# Patient Record
Sex: Male | Born: 1940 | Race: White | Hispanic: No | Marital: Married | Smoking: Never smoker
Health system: Southern US, Community
[De-identification: ages and names within clinical notes are randomized; demographics above are authoritative.]

## PROBLEM LIST (undated history)

## (undated) DIAGNOSIS — Z0000 Encounter for general adult medical examination without abnormal findings: Secondary | ICD-10-CM

## (undated) DIAGNOSIS — Z9889 Other specified postprocedural states: Secondary | ICD-10-CM

## (undated) DIAGNOSIS — H269 Unspecified cataract: Secondary | ICD-10-CM

## (undated) DIAGNOSIS — E039 Hypothyroidism, unspecified: Secondary | ICD-10-CM

## (undated) DIAGNOSIS — R112 Nausea with vomiting, unspecified: Secondary | ICD-10-CM

## (undated) DIAGNOSIS — I10 Essential (primary) hypertension: Secondary | ICD-10-CM

## (undated) DIAGNOSIS — M1990 Unspecified osteoarthritis, unspecified site: Secondary | ICD-10-CM

## (undated) DIAGNOSIS — K219 Gastro-esophageal reflux disease without esophagitis: Secondary | ICD-10-CM

## (undated) DIAGNOSIS — M25511 Pain in right shoulder: Secondary | ICD-10-CM

## (undated) HISTORY — DX: Unspecified osteoarthritis, unspecified site: M19.90

## (undated) HISTORY — DX: Gastro-esophageal reflux disease without esophagitis: K21.9

## (undated) HISTORY — DX: Pain in right shoulder: M25.511

## (undated) HISTORY — PX: JOINT REPLACEMENT: SHX530

## (undated) HISTORY — DX: Encounter for general adult medical examination without abnormal findings: Z00.00

## (undated) HISTORY — PX: OTHER SURGICAL HISTORY: SHX169

## (undated) HISTORY — PX: HIP SURGERY: SHX245

## (undated) HISTORY — PX: TOTAL HIP ARTHROPLASTY: SHX124

## (undated) HISTORY — DX: Essential (primary) hypertension: I10

## (undated) HISTORY — PX: CHOLECYSTECTOMY: SHX55

## (undated) HISTORY — DX: Unspecified cataract: H26.9

## (undated) HISTORY — PX: COLONOSCOPY: SHX174

---

## 2003-05-05 HISTORY — PX: JOINT REPLACEMENT: SHX530

## 2003-07-09 ENCOUNTER — Inpatient Hospital Stay (HOSPITAL_COMMUNITY): Admission: RE | Admit: 2003-07-09 | Discharge: 2003-07-12 | Payer: Self-pay | Admitting: Orthopedic Surgery

## 2004-04-08 ENCOUNTER — Ambulatory Visit: Payer: Self-pay | Admitting: Family Medicine

## 2004-04-15 ENCOUNTER — Ambulatory Visit: Payer: Self-pay | Admitting: Family Medicine

## 2004-05-21 ENCOUNTER — Ambulatory Visit: Payer: Self-pay | Admitting: Internal Medicine

## 2004-08-19 ENCOUNTER — Ambulatory Visit: Payer: Self-pay | Admitting: Family Medicine

## 2004-11-24 ENCOUNTER — Ambulatory Visit: Payer: Self-pay | Admitting: Family Medicine

## 2005-06-02 ENCOUNTER — Ambulatory Visit: Payer: Self-pay | Admitting: Family Medicine

## 2005-06-16 ENCOUNTER — Ambulatory Visit: Payer: Self-pay | Admitting: Family Medicine

## 2005-08-05 ENCOUNTER — Ambulatory Visit: Payer: Self-pay | Admitting: Family Medicine

## 2005-09-15 ENCOUNTER — Ambulatory Visit: Payer: Self-pay | Admitting: Family Medicine

## 2005-09-22 ENCOUNTER — Ambulatory Visit: Payer: Self-pay | Admitting: Family Medicine

## 2006-03-10 ENCOUNTER — Ambulatory Visit: Payer: Self-pay | Admitting: Family Medicine

## 2006-08-18 ENCOUNTER — Ambulatory Visit: Payer: Self-pay | Admitting: Family Medicine

## 2006-08-18 LAB — CONVERTED CEMR LAB
ALT: 24 units/L (ref 0–40)
AST: 15 units/L (ref 0–37)
Basophils Relative: 0.1 % (ref 0.0–1.0)
Bilirubin, Direct: 0.3 mg/dL (ref 0.0–0.3)
CO2: 30 meq/L (ref 19–32)
Calcium: 9.6 mg/dL (ref 8.4–10.5)
Chloride: 108 meq/L (ref 96–112)
Eosinophils Relative: 1.4 % (ref 0.0–5.0)
GFR calc Af Amer: 87 mL/min
Glucose, Bld: 102 mg/dL — ABNORMAL HIGH (ref 70–99)
HCT: 41.9 % (ref 39.0–52.0)
Hemoglobin: 14.6 g/dL (ref 13.0–17.0)
Lymphocytes Relative: 18.2 % (ref 12.0–46.0)
Neutro Abs: 3.6 10*3/uL (ref 1.4–7.7)
Neutrophils Relative %: 69.2 % (ref 43.0–77.0)
Platelets: 123 10*3/uL — ABNORMAL LOW (ref 150–400)
Sodium: 144 meq/L (ref 135–145)
Total Protein: 6.8 g/dL (ref 6.0–8.3)
Triglycerides: 107 mg/dL (ref 0–149)
VLDL: 21 mg/dL (ref 0–40)
WBC: 5.2 10*3/uL (ref 4.5–10.5)

## 2007-01-12 DIAGNOSIS — K219 Gastro-esophageal reflux disease without esophagitis: Secondary | ICD-10-CM

## 2007-01-12 DIAGNOSIS — I10 Essential (primary) hypertension: Secondary | ICD-10-CM | POA: Insufficient documentation

## 2007-02-02 ENCOUNTER — Ambulatory Visit: Payer: Self-pay | Admitting: Family Medicine

## 2007-03-01 ENCOUNTER — Encounter: Payer: Self-pay | Admitting: Family Medicine

## 2007-09-21 ENCOUNTER — Ambulatory Visit: Payer: Self-pay | Admitting: Family Medicine

## 2007-09-21 DIAGNOSIS — M1990 Unspecified osteoarthritis, unspecified site: Secondary | ICD-10-CM

## 2007-09-21 DIAGNOSIS — E782 Mixed hyperlipidemia: Secondary | ICD-10-CM | POA: Insufficient documentation

## 2007-09-21 HISTORY — DX: Unspecified osteoarthritis, unspecified site: M19.90

## 2007-09-27 ENCOUNTER — Telehealth: Payer: Self-pay | Admitting: Family Medicine

## 2007-09-28 ENCOUNTER — Ambulatory Visit: Payer: Self-pay | Admitting: Family Medicine

## 2007-10-19 ENCOUNTER — Ambulatory Visit: Payer: Self-pay | Admitting: Family Medicine

## 2007-10-19 DIAGNOSIS — L259 Unspecified contact dermatitis, unspecified cause: Secondary | ICD-10-CM

## 2007-10-19 DIAGNOSIS — F528 Other sexual dysfunction not due to a substance or known physiological condition: Secondary | ICD-10-CM

## 2008-02-07 ENCOUNTER — Ambulatory Visit: Payer: Self-pay | Admitting: Family Medicine

## 2008-02-07 DIAGNOSIS — IMO0001 Reserved for inherently not codable concepts without codable children: Secondary | ICD-10-CM

## 2008-02-07 DIAGNOSIS — M779 Enthesopathy, unspecified: Secondary | ICD-10-CM | POA: Insufficient documentation

## 2008-03-27 ENCOUNTER — Ambulatory Visit: Payer: Self-pay | Admitting: Family Medicine

## 2008-09-11 ENCOUNTER — Ambulatory Visit: Payer: Self-pay | Admitting: Family Medicine

## 2008-09-11 LAB — CONVERTED CEMR LAB
OCCULT 1: NEGATIVE
OCCULT 2: NEGATIVE
OCCULT 3: NEGATIVE

## 2008-10-03 ENCOUNTER — Encounter: Payer: Self-pay | Admitting: Family Medicine

## 2008-10-06 ENCOUNTER — Emergency Department (HOSPITAL_BASED_OUTPATIENT_CLINIC_OR_DEPARTMENT_OTHER): Admission: EM | Admit: 2008-10-06 | Discharge: 2008-10-06 | Payer: Self-pay | Admitting: Emergency Medicine

## 2008-10-16 ENCOUNTER — Emergency Department (HOSPITAL_BASED_OUTPATIENT_CLINIC_OR_DEPARTMENT_OTHER): Admission: EM | Admit: 2008-10-16 | Discharge: 2008-10-16 | Payer: Self-pay | Admitting: Emergency Medicine

## 2009-03-14 ENCOUNTER — Encounter (INDEPENDENT_AMBULATORY_CARE_PROVIDER_SITE_OTHER): Payer: Self-pay | Admitting: *Deleted

## 2009-03-14 ENCOUNTER — Ambulatory Visit: Payer: Self-pay | Admitting: Family Medicine

## 2009-03-14 DIAGNOSIS — E559 Vitamin D deficiency, unspecified: Secondary | ICD-10-CM | POA: Insufficient documentation

## 2009-03-14 DIAGNOSIS — D649 Anemia, unspecified: Secondary | ICD-10-CM

## 2009-03-14 DIAGNOSIS — R350 Frequency of micturition: Secondary | ICD-10-CM

## 2009-03-14 DIAGNOSIS — T50995A Adverse effect of other drugs, medicaments and biological substances, initial encounter: Secondary | ICD-10-CM

## 2009-03-14 DIAGNOSIS — E039 Hypothyroidism, unspecified: Secondary | ICD-10-CM

## 2009-03-14 LAB — CONVERTED CEMR LAB
Bilirubin Urine: NEGATIVE
Glucose, Urine, Semiquant: NEGATIVE
Protein, U semiquant: NEGATIVE
Specific Gravity, Urine: <1.005
pH: 5.5

## 2009-03-21 LAB — CONVERTED CEMR LAB
ALT: 21 units/L (ref 0–53)
Albumin: 4.4 g/dL (ref 3.5–5.2)
BUN: 14 mg/dL (ref 6–23)
Basophils Absolute: 0 10*3/uL (ref 0.0–0.1)
CO2: 29 meq/L (ref 19–32)
Chloride: 105 meq/L (ref 96–112)
Cholesterol: 139 mg/dL (ref 0–200)
Glucose, Bld: 107 mg/dL — ABNORMAL HIGH (ref 70–99)
HCT: 42 % (ref 39.0–52.0)
Hemoglobin: 14.4 g/dL (ref 13.0–17.0)
Lymphs Abs: 0.9 10*3/uL (ref 0.7–4.0)
MCV: 95 fL (ref 78.0–100.0)
Monocytes Absolute: 0.4 10*3/uL (ref 0.1–1.0)
Neutro Abs: 3.1 10*3/uL (ref 1.4–7.7)
PSA: 0.91 ng/mL (ref 0.10–4.00)
Platelets: 129 10*3/uL — ABNORMAL LOW (ref 150.0–400.0)
Potassium: 4.5 meq/L (ref 3.5–5.1)
RDW: 11.9 % (ref 11.5–14.6)
TSH: 1.08 microintl units/mL (ref 0.35–5.50)
Total Bilirubin: 1.2 mg/dL (ref 0.3–1.2)

## 2009-04-01 ENCOUNTER — Encounter (INDEPENDENT_AMBULATORY_CARE_PROVIDER_SITE_OTHER): Payer: Self-pay | Admitting: *Deleted

## 2009-04-02 ENCOUNTER — Ambulatory Visit: Payer: Self-pay | Admitting: Internal Medicine

## 2009-04-15 ENCOUNTER — Ambulatory Visit: Payer: Self-pay | Admitting: Internal Medicine

## 2009-04-15 LAB — HM COLONOSCOPY

## 2009-04-30 ENCOUNTER — Telehealth (INDEPENDENT_AMBULATORY_CARE_PROVIDER_SITE_OTHER): Payer: Self-pay | Admitting: *Deleted

## 2009-05-02 ENCOUNTER — Ambulatory Visit: Payer: Self-pay | Admitting: Family Medicine

## 2009-12-31 ENCOUNTER — Ambulatory Visit: Payer: Self-pay | Admitting: Family Medicine

## 2010-04-29 ENCOUNTER — Telehealth: Payer: Self-pay | Admitting: Family Medicine

## 2010-05-23 ENCOUNTER — Telehealth: Payer: Self-pay | Admitting: Family Medicine

## 2010-06-01 LAB — CONVERTED CEMR LAB
AST: 15 units/L (ref 0–37)
Albumin: 4.2 g/dL (ref 3.5–5.2)
Alkaline Phosphatase: 65 units/L (ref 39–117)
BUN: 21 mg/dL (ref 6–23)
Blood in Urine, dipstick: NEGATIVE
Chloride: 108 meq/L (ref 96–112)
Eosinophils Absolute: 0.2 10*3/uL (ref 0.0–0.7)
Eosinophils Relative: 6.1 % — ABNORMAL HIGH (ref 0.0–5.0)
GFR calc non Af Amer: 65 mL/min
Glucose, Bld: 107 mg/dL — ABNORMAL HIGH (ref 70–99)
HDL: 44.9 mg/dL (ref >39.0)
LDL Cholesterol: 67 mg/dL (ref 0–99)
Monocytes Relative: 8.2 % (ref 3.0–12.0)
Neutrophils Relative %: 66.7 % (ref 43.0–77.0)
Nitrite: NEGATIVE
Platelets: 135 10*3/uL — ABNORMAL LOW (ref 150–400)
Potassium: 4.3 meq/L (ref 3.5–5.1)
Protein, U semiquant: NEGATIVE
Total CHOL/HDL Ratio: 2.8
VLDL: 12 mg/dL (ref 0–40)
WBC Urine, dipstick: NEGATIVE
WBC: 3.9 10*3/uL — ABNORMAL LOW (ref 4.5–10.5)

## 2010-06-03 NOTE — Assessment & Plan Note (Signed)
Summary: SKIN PROBLEM/NJR   Vital Signs:  Patient profile:   70 year old male Height:      70 inches (177.80 cm) Weight:      175 pounds (79.55 kg) BMI:     25.20 O2 Sat:      98 % on Room air Temp:     98.1 degrees F (36.72 degrees C) oral Pulse rate:   73 / minute BP sitting:   118 / 74  (left arm) Cuff size:   regular  Vitals Entered By: Josph Macho RMA (December 31, 2009 9:15 AM)  O2 Flow:  Room air CC: skin problem X1 month off and on- right arm and left leg irritation/ CF Is Patient Diabetic? No   History of Present Illness: Patient in today for evaluation of a rash he has had for the past several months. The rash is migratory and fluctuates in intensity and location. He cannot pin the rash on any particular product or change. He has tried eliminating meds/products for several days at a time and so far this has not altered the rash. At present he has stopped his Aspirin for the past 3 days and had no improvement in the rash. The rashes tend to be on his extremities. Started on his right arm at the wrist and was very itchy with mild rash and erythema. He then developed a patch over his right upper arm and then on his left arm. He is now having itching and rash over his upper thighs L>R and onto left posterior buttock. He works in his yard frequently but has not had any vesicles or burning suggestive of poison or any discrete lesions that resemble bug bites. No f/c/malaise/URI or GI symptoms. No CP/palp/SOB.  Current Medications (verified): 1)  Micardis 80 Mg Tabs (Telmisartan) .Marland Kitchen.. 1 By Mouth Once Daily 2)  Norvasc 5 Mg  Tabs (Amlodipine Besylate) .Marland Kitchen.. 1 By Mouth Once Daily 3)  Adult Aspirin Low Strength 81 Mg  Tbdp (Aspirin) 4)  Viagra 50 Mg  Tabs (Sildenafil Citrate) .Marland Kitchen.. 1 As Instructed 5)  Crestor 20 Mg Tabs (Rosuvastatin Calcium) .... Pt Takes 1/2 Tab Once Daily. 6)  Vitamin D (Ergocalciferol) 50000 Unit Caps (Ergocalciferol) .Marland Kitchen.. 1 Weekly For 12 Weeks  Allergies  (verified): No Known Drug Allergies  Past History:  Past medical history reviewed for relevance to current acute and chronic problems. Social history (including risk factors) reviewed for relevance to current acute and chronic problems.  Past Medical History: Reviewed history from 01/12/2007 and no changes required. Arthritis GERD Hypertension  Social History: Reviewed history from 01/12/2007 and no changes required. Retired Married Never Smoked  Review of Systems      See HPI  Physical Exam  General:  Well-developed,well-nourished,in no acute distress; alert,appropriate and cooperative throughout examination Head:  Normocephalic and atraumatic without obvious abnormalities. No apparent alopecia or balding. Mouth:  Oral mucosa and oropharynx without lesions or exudates.  Teeth in good repair. Neck:  No deformities, masses, or tenderness noted. Lungs:  Normal respiratory effort, chest expands symmetrically. Lungs are clear to auscultation, no crackles or wheezes. Heart:  Normal rate and regular rhythm. S1 and S2 normal without gallop, murmur, click, rub or other extra sounds. Abdomen:  Bowel sounds positive,abdomen soft and non-tender without masses, organomegaly or hernias noted. Pulses:  R and L carotid,radial,femoral,dorsalis pedis and posterior tibial pulses are full and equal bilaterally Extremities:  No clubbing, cyanosis, edema, or deformity noted   Skin:  patch of excoriated, small scabbed lesions on  right upper arm. A mildly erythematous patch over b/l wrists noted. No obvious uriticarial leisons Psych:  Cognition and judgment appear intact. Alert and cooperative with normal attention span and concentration. No apparent delusions, illusions, hallucinations   Impression & Recommendations:  Problem # 1:  DERMATITIS, ALLERGIC (ICD-692.9)  His updated medication list for this problem includes:    Triamcinolone Acetonide 0.1 % Crea (Triamcinolone acetonide) .Marland Kitchen... Apply  to affected areas two times a day as needed itching/rash    Zyrtec Allergy 10 Mg Tabs (Cetirizine hcl) .Marland Kitchen... 1 tab by mouth once daily to two times a day as needed dermititis/pruritis Try OTC products as directed and report persistent rash for further evaluation.  He has some concerns that this first started appearing some time after his insurance company made him switch from brand name Norvasc to Amlodipine. May need to consider switching back to the brand name if no improvement  Problem # 2:  HYPERTENSION (ICD-401.9)  His updated medication list for this problem includes:    Micardis 80 Mg Tabs (Telmisartan) .Marland Kitchen... 1 by mouth once daily    Norvasc 5 Mg Tabs (Amlodipine besylate) .Marland Kitchen... 1 by mouth once daily Well controlled at today's visit, no changes to meds  Complete Medication List: 1)  Micardis 80 Mg Tabs (Telmisartan) .Marland Kitchen.. 1 by mouth once daily 2)  Norvasc 5 Mg Tabs (Amlodipine besylate) .Marland Kitchen.. 1 by mouth once daily 3)  Adult Aspirin Low Strength 81 Mg Tbdp (Aspirin) 4)  Viagra 50 Mg Tabs (Sildenafil citrate) .Marland Kitchen.. 1 as instructed 5)  Crestor 20 Mg Tabs (Rosuvastatin calcium) .... Pt takes 1/2 tab once daily. 6)  Vitamin D (ergocalciferol) 50000 Unit Caps (Ergocalciferol) .Marland KitchenMarland KitchenMarland Kitchen 1 weekly for 12 weeks 7)  Triamcinolone Acetonide 0.1 % Crea (Triamcinolone acetonide) .... Apply to affected areas two times a day as needed itching/rash 8)  Zyrtec Allergy 10 Mg Tabs (Cetirizine hcl) .Marland Kitchen.. 1 tab by mouth once daily to two times a day as needed dermititis/pruritis  Patient Instructions: 1)  Please schedule a follow-up appointment as needed if symptoms worsen or do not resolve 2)  Avoid any new products and consider changing Laundry soap to a mild soap with no perfumes or additives. Such as Associate Professor.  3)  Zyrtec 10mg  as directed 4)  Sarna antiitch lotion as needed 5)  Witch Hazel Astringent will clean the area and decrease itching as well 6)  Triamcinolone cream is a steroid so use as  little as needed to cover the area up to twice daily when rash or itching is present Prescriptions: ZYRTEC ALLERGY 10 MG TABS (CETIRIZINE HCL) 1 tab by mouth once daily to two times a day as needed dermititis/pruritis  #60 x 1   Entered and Authorized by:   Danise Edge MD   Signed by:   Danise Edge MD on 12/31/2009   Method used:   Electronically to        [facility] (retail)       [address]       [place], [place]  [address]       Ph: [phone] or [phone]       Fax: [phone]   RxID:   [id number] TRIAMCINOLONE ACETONIDE 0.1 % CREA (TRIAMCINOLONE ACETONIDE) apply to affected areas two times a day as needed itching/rash  #60gm x 1   Entered and Authorized by:   Danise Edge MD   Signed by:   Misty Stanley  Abner Greenspan MD on 12/31/2009   Method used:   Electronically to        [facility] (retail)       [address]       [place], [place]  [address]       Ph: [phone] or [phone]       Fax: [phone]   RxID:   [id number]

## 2010-06-05 NOTE — Progress Notes (Signed)
Summary: micardis  Phone Note Refill Request Message from:  Fax from Pharmacy on April 29, 2010 2:47 PM  Refills Requested: Medication #1:  MICARDIS 80 MG TABS 1 by mouth once daily Initial call taken by: Kern Reap CMA Duncan Dull),  April 29, 2010 2:47 PM    Prescriptions: MICARDIS 80 MG TABS (TELMISARTAN) 1 by mouth once daily  #30 Tablet x 3   Entered by:   Kern Reap CMA (AAMA)   Authorized by:   Judithann Sheen MD   Signed by:   Kern Reap CMA (AAMA) on 04/29/2010   Method used:   Electronically to        [facility] (retail)       [address]       [place], [place]  [address]       Ph: [phone] or [phone]       Fax: [phone]   RxID:   [id number]

## 2010-06-05 NOTE — Progress Notes (Signed)
Summary: REFILL REQUEST  Phone Note Refill Request Message from:  Patient on May 23, 2010 12:27 PM  Refills Requested: Medication #1:  CRESTOR 20 MG TABS pt takes 1/2 tab once daily.   Notes: [facility] [address].... Pt has appt for cpx with Dr Scotty Court on 09/16/10.  Medication #2:  NORVASC 5 MG  TABS 1 by mouth once daily  Pt has appt for cpx with Dr Scotty Court on 09/16/10.   Initial call taken by: Debbra Riding,  May 23, 2010 12:28 PM  Follow-up for Phone Call        done Follow-up by: Kyung Rudd, CMA,  May 23, 2010 1:47 PM    Prescriptions: CRESTOR 20 MG TABS (ROSUVASTATIN CALCIUM) pt takes 1/2 tab once daily.  #30 x 11   Entered by:   Kyung Rudd, CMA   Authorized by:   Judithann Sheen MD   Signed by:   Kyung Rudd, CMA on 05/23/2010   Method used:   Electronically to        [facility] (retail)       [address]       [place], [place]  [address]       Ph: [phone] or [phone]       Fax: [phone]   RxID:   [id number] NORVASC 5 MG  TABS (AMLODIPINE BESYLATE) 1 by mouth once daily  #30 Tablet x 11   Entered by:   Kyung Rudd, CMA   Authorized by:   Judithann Sheen MD   Signed by:   Kyung Rudd, CMA on 05/23/2010   Method used:   Electronically to        [facility] (retail)       [address]       [place], [place]  [address]       Ph: [phone] or [phone]       Fax: [phone]   RxID:   [id number]

## 2010-09-03 ENCOUNTER — Encounter: Payer: Self-pay | Admitting: Internal Medicine

## 2010-09-03 ENCOUNTER — Ambulatory Visit (INDEPENDENT_AMBULATORY_CARE_PROVIDER_SITE_OTHER): Payer: Medicare Other | Admitting: Internal Medicine

## 2010-09-03 DIAGNOSIS — L989 Disorder of the skin and subcutaneous tissue, unspecified: Secondary | ICD-10-CM

## 2010-09-03 MED ORDER — CEPHALEXIN 500 MG PO CAPS: 500.0000 mg | ORAL_CAPSULE | Freq: Three times a day (TID) | ORAL | Status: AC

## 2010-09-03 NOTE — Assessment & Plan Note (Signed)
Does not appear to be actively infected at this time. Continue over-the-counter anti-body ointment for prophylaxis. Given prescription for Keflex x7 days to hold. Again if develops fevers, increasing erythema or pus/discharge. Followup if no improvement or worsening.

## 2010-09-03 NOTE — Progress Notes (Signed)
  Subjective:    Patient ID: Micheal Mcintyre, male    DOB: Jun 12, 1940, 70 y.o.   MRN: [id number]  HPI Pt presents to clinic for evaluation of sore on foot. Notes a five-day history of left dorsal foot sore which began initially as a blister. Spontaneously ruptured. Has had no fever, chills or discharge pus. Has had some minimal erythema appears using over-the-counter enema cream. Does have remote history of left hip infection and subsequent bacteremia. No alleviating or exacerbating factors. No other complaints.     Review of Systems  Constitutional: Negative for fever and chills.  Musculoskeletal: Positive for gait problem. Negative for arthralgias.  Skin: Positive for color change and wound. Negative for pallor and rash.       Objective:   Physical Exam  Nursing note and vitals reviewed. Constitutional: He appears well-developed and well-nourished. No distress.  HENT:  Head: Normocephalic and atraumatic.  Eyes: Conjunctivae are normal.  Neurological: He is alert.  Skin: Skin is warm and dry. No rash noted. He is not diaphoretic. There is erythema. No pallor.       Left dorsal foot reveals small approximately half centimeter superficial sore. Minimal surrounding erythema. No discharge. Nontender.          Assessment & Plan:

## 2010-09-16 ENCOUNTER — Ambulatory Visit (INDEPENDENT_AMBULATORY_CARE_PROVIDER_SITE_OTHER): Payer: Medicare Other | Admitting: Family Medicine

## 2010-09-16 ENCOUNTER — Encounter: Payer: Self-pay | Admitting: Family Medicine

## 2010-09-16 DIAGNOSIS — R3915 Urgency of urination: Secondary | ICD-10-CM

## 2010-09-16 DIAGNOSIS — E785 Hyperlipidemia, unspecified: Secondary | ICD-10-CM

## 2010-09-16 DIAGNOSIS — E559 Vitamin D deficiency, unspecified: Secondary | ICD-10-CM

## 2010-09-16 DIAGNOSIS — N401 Enlarged prostate with lower urinary tract symptoms: Secondary | ICD-10-CM

## 2010-09-16 DIAGNOSIS — I10 Essential (primary) hypertension: Secondary | ICD-10-CM

## 2010-09-16 DIAGNOSIS — E039 Hypothyroidism, unspecified: Secondary | ICD-10-CM

## 2010-09-16 DIAGNOSIS — D649 Anemia, unspecified: Secondary | ICD-10-CM

## 2010-09-16 LAB — POCT URINALYSIS DIPSTICK
Ketones, UA: NEGATIVE
Protein, UA: NEGATIVE
Spec Grav, UA: 1.01
Urobilinogen, UA: 0.2
pH, UA: 7

## 2010-09-16 LAB — CBC WITH DIFFERENTIAL/PLATELET
Basophils Relative: 0.6 % (ref 0.0–3.0)
Eosinophils Relative: 1.7 % (ref 0.0–5.0)
Lymphocytes Relative: 16.2 % (ref 12.0–46.0)
Monocytes Absolute: 0.4 10*3/uL (ref 0.1–1.0)
Monocytes Relative: 7.3 % (ref 3.0–12.0)
Neutrophils Relative %: 74.2 % (ref 43.0–77.0)
Platelets: 130 10*3/uL — ABNORMAL LOW (ref 150.0–400.0)
RBC: 4.39 Mil/uL (ref 4.22–5.81)
WBC: 5.3 10*3/uL (ref 4.5–10.5)

## 2010-09-16 LAB — BASIC METABOLIC PANEL
CO2: 30 mEq/L (ref 19–32)
Calcium: 9.6 mg/dL (ref 8.4–10.5)
Creatinine, Ser: 1.1 mg/dL (ref 0.4–1.5)
GFR: 71.61 mL/min (ref >60.00)
Glucose, Bld: 93 mg/dL (ref 70–99)
Sodium: 142 mEq/L (ref 135–145)

## 2010-09-16 LAB — HEPATIC FUNCTION PANEL
ALT: 22 U/L (ref 0–53)
AST: 15 U/L (ref 0–37)
Albumin: 4.2 g/dL (ref 3.5–5.2)
Total Bilirubin: 1 mg/dL (ref 0.3–1.2)
Total Protein: 6.9 g/dL (ref 6.0–8.3)

## 2010-09-16 LAB — LIPID PANEL
Cholesterol: 130 mg/dL (ref 0–200)
LDL Cholesterol: 54 mg/dL (ref 0–99)
Triglycerides: 128 mg/dL (ref 0.0–149.0)
VLDL: 25.6 mg/dL (ref 0.0–40.0)

## 2010-09-16 LAB — PSA: PSA: 0.91 ng/mL (ref 0.10–4.00)

## 2010-09-16 MED ORDER — AMLODIPINE BESYLATE 5 MG PO TABS: 5.0000 mg | ORAL_TABLET | Freq: Every day | ORAL | Status: DC

## 2010-09-16 MED ORDER — TELMISARTAN 80 MG PO TABS: 80.0000 mg | ORAL_TABLET | Freq: Every day | ORAL | Status: DC

## 2010-09-16 MED ORDER — ROSUVASTATIN CALCIUM 20 MG PO TABS: 20.0000 mg | ORAL_TABLET | Freq: Every day | ORAL | Status: DC

## 2010-09-16 NOTE — Patient Instructions (Signed)
I think you are doing fine,for blood pressure  Take 1/2  amlodypine each day ,continue micardis Check BP 3 time week for next month sometimes in am other  times of day Will call la results

## 2010-09-19 NOTE — Discharge Summary (Signed)
NAME:  Micheal Mcintyre, Micheal Mcintyre                             ACCOUNT NO.:  [id number]   MEDICAL RECORD NO.:  [id number]                   PATIENT TYPE:  INP   LOCATION:  0471                                 FACILITY:  [facility]   PHYSICIAN:  Ollen Gross, M.D.                 DATE OF BIRTH:  February 24, 1941   DATE OF ADMISSION:  07/09/2003  DATE OF DISCHARGE:  07/12/2003                                 DISCHARGE SUMMARY   ADMITTING DIAGNOSES:  1. Osteoarthritis right hip.  2. Hypertension.  3. History of reflux disease.  4. History of prostatitis.   DISCHARGE DIAGNOSES:  1. 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  2. Postoperative asymptomatic hypotension, resolved.  3. Mild postoperative blood loss anemia, did not require transfusion.  4. Hypertension.  5. History of reflux disease.  6. History of prostatitis.   PROCEDURE:  Date of surgery:  July 09, 2003.  Right total hip arthroplasty.  Surgeon:  Dr. Homero Fellers Aluisio.  Assistant:  Avel Peace, P.A.-Mcintyre.  Anesthesia:  General.  Blood loss:  250 mL.  Hemovac drain x1.   CONSULTS:  None.   BRIEF HISTORY:  [name] is a 70 year old male with severe end-stage  osteoarthritis of the right hip.  Pain has been refractory to nonoperative  management.  He now presents for a right total hip arthroplasty.   LABORATORY DATA:  CBC preoperatively:  Hemoglobin of 14.6, hematocrit of  43.0, differential all within normal limits, white count normal at 6.6.  Postoperative H&H 11.3 and 32.2.  Last noted H&H 10.8 and 31.3.  Preoperative PT/PTT 11.6 and 31 respectively with an INR of 0.8.  Serial  protimes followed and last noted PT/INR 17.0 and 1.6.  Chem panel on  admission:  Elevated BUN of 29, elevated AST of 46, elevated ALT of 93.  Serial BMETs are followed.  BUN came back down to 12, drop in sodium from  143 to 133, electrolytes remained within normal limits.  Urinalysis  preoperatively negative.  Blood group and type O positive.   Chest x-ray  dated July 04, 2003:  No active disease.  Right hip films preoperatively  showed severe degenerative arthritic changes of the right hip.  Postoperative hip films:  Satisfactory appearance of right total hip  replacement, severe arthritic changes of the left hip.   HOSPITAL COURSE:  The patient admitted to [facility], taken to the  OR, and underwent above-stated procedure without complications.  The patient  tolerated the procedure well and later transferred to the recovery room and  to the orthopedic floor to continue postoperative care.  Vital signs were  followed, given 24 hours of postoperative antibiotics of Ancef, Coumadin for  3 weeks, started back on his home medications.  PT and OT consulted  postoperatively.  Weightbearing as tolerated.  Hemovac drain placed at the  time of surgery pulled on postoperative day #  1.  He was also noted to have  some asymptomatic hypotension with low blood pressure.  This was taken from  [facility].  We used a manual cuff and it was still low.  We held his blood  pressure medications.  By day #2 the pressure started to trend back up  through the night and though it was still low in the morning of 98/53, the  highest it got through the night was 126/54.  He was doing a little bit  better with his pain control.  PCAs had been discontinued.  IV fluids were  discontinued.  Dressing change initiated on postoperative day #2.  His  incision was healing well.  Even though he had some mild hypotension which  was asymptomatic he actually did very well with his physical therapy.  He  was up ambulating approximately 70 feet by day #2 and then up to 80 feet  combined walking on day #3.  By day #3 his pressure was back up, 105/52, and  his medications were resumed.  Incision was healing well, no signs of  infection.  He was progressing well, pain under control, and was discharged  home.   DISCHARGE MEDICATIONS AND PLAN:  1. The patient was  discharged home on July 12, 2003.  2. Discharge diagnoses:  Please see above.  3. Discharge medications:  Coumadin, Percocet, Robaxin.  4. Diet:  Low sodium diet as tolerated.  5. Follow up in 2 weeks.  Call the office for an appointment.   DISPOSITION:  Home.   ACTIVITY:  Weightbearing as tolerated, total hip precautions.   CONDITION ON DISCHARGE:  Improved.     Alexzandrew L. Julien Girt, P.A.              Ollen Gross, M.D.    ALP/MEDQ  D:  08/15/2003  T:  08/15/2003  Job:  [id number]   cc:   Ellin Saba., M.D.  [address] [place]  [place] [address]  Fax: [phone]

## 2010-09-19 NOTE — H&P (Signed)
NAME:  Micheal Mcintyre, Micheal Mcintyre                             ACCOUNT NO.:  [id number]   MEDICAL RECORD NO.:  [id number]                   PATIENT TYPE:  INP   LOCATION:  0471                                 FACILITY:  [facility]   PHYSICIAN:  Ollen Gross, M.D.                 DATE OF BIRTH:  [birth year]   DATE OF ADMISSION:  07/09/2003  DATE OF DISCHARGE:                                HISTORY & PHYSICAL   CHIEF COMPLAINT:  Right hip pain.   HISTORY OF PRESENT ILLNESS:  The patient is a 70 year old male who has been  seen by Dr. Lequita Halt for a long history regarding pain on the left. 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 Unfortunately, he developed sepsis in the hip and was  hospitalized for a month with septicemia. He has Girdlestone resection of  the left hip at this time. For many years now he has placed much pressure on  the right hip and the right hip has progressed with increasing pain and  difficulty with mobility. 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 Right now, he has pain and functional limitation. He is seen in  the office where the hip shows severe end-stage arthritis with bone-on-bone  changes and osteophyte formation. X-ray also shows resection arthroplasty on  the left with shortening of approximately an inch. It is felt he would  benefit from undergoing a hip replacement arthroplasty on the right. Risks  and benefits of the procedure have been discussed with the patient and he  elects to proceed with surgery. He has been seen preoperatively by Dr.  Scotty Court and felt to be in good condition for his up and coming surgery.   ALLERGIES:  No known drug allergies.   CURRENT MEDICATIONS:  Micardis, Norvasc, Aleve stopped prior to surgery,  Extra-Strength Tylenol.   PAST MEDICAL HISTORY:  Hypertension, reflux disease with no recent flare  ups, history of prostatitis, and osteoarthritis.   PAST SURGICAL HISTORY:  Multiple left hip surgeries including left hip  pinning for slipped capital epiphysis, removal of pinning, and then later a  Girdlestone procedure on the left hip.   FAMILY HISTORY:  Mother deceased, age 10, with a history of skin cancer.  Father deceased, age 71, with history of MI.   SOCIAL HISTORY:  Married, retired, Occupational psychologist. Nonsmoker.  Occasional intake of alcohol. He has two children.   REVIEW OF SYSTEMS:  GENERAL: No fever, chills, nightsweats. NEUROLOGIC: No  seizure, syncope, or paralysis. RESPIRATORY:  No shortness of breath,  productive cough, or hemoptysis. CARDIOVASCULAR: No chest pain, angina, or  orthopnea. GI: No nausea, vomiting, diarrhea, or constipation. GU: No  dysuria, hematuria, or discharge. MUSCULOSKELETAL: Pertinent to that of the  hip found in the history of present  illness.   PHYSICAL EXAMINATION:  VITAL SIGNS: Pulse 92, respirations 12, blood  pressure 112/62.  GENERAL: The patient is a 70 year old white male, well-nourished, well-  developed, in no acute distress. He is alert and cooperative, very pleasant  at the time of exam. He is accompanied by his wife. He is ambulating with a  cane.  HEENT:  Normocephalic and atraumatic. Pupils are round and reactive.  Oropharynx is clear. EOMs are intact.  NECK: Supple. No carotid bruits.  CHEST: Clear in the anterior and posterior chest wall.  HEART: Regular rate and rhythm.  No murmurs.  ABDOMEN: Soft, nontender. Bowel sounds are present.  RECTAL/BREASTS/GENITALIA: Not done; not pertinent to the present illness.  EXTREMITIES: Significant to right hip, he only has right hip flexion to 90  degrees. There is no internal or external rotation on exam. Only about 15 to  20 degrees of abduction. He does ambulate with an antalgic gait. He has  about a one-inch leg length discrepancy, the left leg being shorter than the   right.   IMPRESSION:  1. Osteoarthritis, right hip.  2. Hypertension.  3. History of reflux disease.  4. History of prostatitis.   PLAN:  The patient admitted to [facility] to undergo right total  hip replacement arthroplasty. Surgery will be performed by Dr. Ollen Gross.     Micheal Mcintyre, P.A.              Ollen Gross, M.D.    ALP/MEDQ  D:  07/10/2003  T:  07/10/2003  Job:  [id number]   cc:   Ellin Saba., M.D.  [address] [place]  [place] [address]  Fax: [phone]   Ollen Gross, M.D.  [facility]  [address]  [address]  [place]  [place] [address]  Fax: [phone]

## 2010-09-19 NOTE — Op Note (Signed)
NAME:  Micheal Mcintyre, Micheal Mcintyre                             ACCOUNT NO.:  [id number]   MEDICAL RECORD NO.:  [id number]                   PATIENT TYPE:  INP   LOCATION:  X009                                 FACILITY:  [facility]   PHYSICIAN:  Ollen Gross, M.D.                 DATE OF BIRTH:  Jun 10, 1940   DATE OF PROCEDURE:  07/09/2003  DATE OF DISCHARGE:                                 OPERATIVE REPORT   PREOPERATIVE DIAGNOSIS:  Osteoarthritis, right hip.   POSTOPERATIVE DIAGNOSIS:  Osteoarthritis, right hip.   PROCEDURE:  Right total hip arthroplasty.   SURGEON:  Gus Rankin. Aluisio, M.D.   ASSISTANT:  Avel Peace, P.A.-Mcintyre.   ANESTHESIA:  General.   ESTIMATED BLOOD LOSS:  250.   DRAIN:  Hemovac x 1.   COMPLICATIONS:  None.   CONDITION:  Stable to recovery.   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  He presents now for right total hip arthroplasty.   PROCEDURE IN DETAIL:  After the successful administration of general  anesthetic, the patient is placed in the left lateral decubitus position  with the right side up and held with the hip positioner.  The right lower  extremity is isolated from his perineum with plastic drapes and prepped and  draped in the usual sterile fashion.  Mini posterolateral incision is made  with a 10 blade through subcutaneous tissue to the level of fascia lata  which is incised in line with the skin incision.  Sciatic nerve is palpated  and protected and short rotators isolated off the femur.  Capsulectomy is  performed, and the hip is dislocated.  The center of the femoral head is  marked, and a trial prosthesis is placed such that the center of the trial  head corresponds to the center of his native femoral head.  Osteotomy line  is marked on the femoral neck, and osteotomy is made with an oscillating  saw.  The femur is then retracted  anteriorly to gain acetabular exposure.   Acetabular labrum and osteophytes are removed.  Reaming is starting at a 47,  coursing in increments of 2 to a 53.  Then a 54 mm Pinnacle acetabular shell  is placed in anatomic position and transfixed with two dome screws.  Trial  36 mm neutral liner is placed.   The femur is then prepared, first with the canal finder and then irrigation.  Axial remaining is performed up to 13.5 mm, proximal reaming to an 18D, and  the sleeve machined to a large.  An 18D large trial sleeve and 18 x 13 stem  and a 36 plus 8 neck are placed.  The neck is matching the native  anteversion of the patient's femoral neck.  The 36 plus 0 head was placed.  The  hip is reduced with excellent stability, full extension, full external  rotation, 70 degrees flexion, 40 degrees adduction, 90 degrees internal  rotation, and 90 degrees flexion and 70 degrees internal rotation.  There is  just a tiny bit of soft tissue laxity with a shuck test, and we went to a 36  plus 3 which effectively tightened that up.  The trials are then removed,  and a permanent apex hole eliminator and permanent 36 mm neutral Ultra-met  metal liner is placed into the acetabular shell.  This is a metal-on-metal  hip replacement.  The 18D large sleeve is placed into the proximal femur.  Then the 18 x 13 stem with a 36 plus 8 neck is placed.  A 36 plus 3 head is  placed, and the hip is reduced with the same stability parameters.  Wound is  then copiously irrigated with antibiotic solution and external rotators  reattached to the femur through drill holes.  Fascia lata is closed over a  Hemovac drain with interrupted #1 Vicryl, subcu closed with #1 then 2-0  Vicryl, and subcuticular running 4-0 Monocryl.  The incision is cleaned and  dried and Steri-Strips and a bulky sterile dressing applied.  He is then  awakened and transported to recovery in stable condition.                                                Ollen Gross, M.D.    FA/MEDQ  D:  07/09/2003  T:  07/09/2003  Job:  [id number]

## 2010-09-22 ENCOUNTER — Encounter: Payer: Self-pay | Admitting: Family Medicine

## 2010-09-22 NOTE — Progress Notes (Signed)
  Subjective:    Patient ID: Micheal Mcintyre, male    DOB: 09/24/1940, 70 y.o.   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 He has had Hyper tension for some time but has been controlled and in fact has been low readings 162 and at home or readings done that, he is complaining of pain in the left shoulder strain lifted his grandchild He relates he is dizzy at times when standing standing out or change of position As correct all dysfunction which is controlled with either Viagra or Cialis    HPI    Review of Systems  Constitutional: Negative.   HENT: Negative.   Eyes: Negative.   Respiratory: Negative.   Cardiovascular: Negative.        Hypertension well controlled  Gastrointestinal: Negative.   Genitourinary:       Erectile dysfunction  Musculoskeletal: Negative.   Skin: Negative.   Neurological: Negative.   Hematological: Negative.   Psychiatric/Behavioral: Negative.        Objective:   Physical Exam the patient is well-developed well-nourished white male who is in no distress pleasant and cooperative HEENT negative carotid pulses good thyroid not palpable Lungs clear to palpation percussion and auscultation no dullness no rales Heart no evidence of cardiomegaly heart sounds good without murmurs regular rhythm Abdomen liver spleen and kidneys are nonpalpable no masses felt cholecystectomy scar bowel sounds normal Genitalia normal rectal exam reveals normal prostate no enlargement no nodules no tenderness Extremities right hip displacement right leg is longer than left 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 Day erectilel dysfunction continue Viagra Continued at 81 mg aspirin q. day

## 2011-01-22 ENCOUNTER — Telehealth: Payer: Self-pay

## 2011-01-22 NOTE — Telephone Encounter (Signed)
Left a message for pt stating it is okay to drop off paper to be filled out.

## 2011-01-22 NOTE — Telephone Encounter (Signed)
Pt called about diability parking placard that expires in Nov. 2012. Pt states he needs it signed. Would like to know if he can bring it by or if he needs an appt.

## 2011-01-22 NOTE — Telephone Encounter (Signed)
Per Dr. Scotty Court drop it off.

## 2011-09-22 ENCOUNTER — Ambulatory Visit: Payer: Medicare Other | Admitting: Internal Medicine

## 2011-10-09 ENCOUNTER — Ambulatory Visit (INDEPENDENT_AMBULATORY_CARE_PROVIDER_SITE_OTHER): Payer: Medicare Other | Admitting: Internal Medicine

## 2011-10-09 ENCOUNTER — Encounter: Payer: Self-pay | Admitting: Internal Medicine

## 2011-10-09 VITALS — BP 108/60 | HR 69 | Temp 97.9°F | Resp 18 | Ht 70.0 in | Wt 174.0 lb

## 2011-10-09 DIAGNOSIS — I10 Essential (primary) hypertension: Secondary | ICD-10-CM | POA: Diagnosis not present

## 2011-10-09 DIAGNOSIS — Z79899 Other long term (current) drug therapy: Secondary | ICD-10-CM

## 2011-10-09 DIAGNOSIS — Z125 Encounter for screening for malignant neoplasm of prostate: Secondary | ICD-10-CM

## 2011-10-09 DIAGNOSIS — E039 Hypothyroidism, unspecified: Secondary | ICD-10-CM

## 2011-10-09 DIAGNOSIS — E785 Hyperlipidemia, unspecified: Secondary | ICD-10-CM | POA: Diagnosis not present

## 2011-10-09 LAB — CBC WITH DIFFERENTIAL/PLATELET
Basophils Absolute: 0 10*3/uL (ref 0.0–0.1)
Eosinophils Absolute: 0.1 10*3/uL (ref 0.0–0.7)
Eosinophils Relative: 3 % (ref 0–5)
HCT: 38.7 % — ABNORMAL LOW (ref 39.0–52.0)
Lymphocytes Relative: 19 % (ref 12–46)
Lymphs Abs: 0.9 10*3/uL (ref 0.7–4.0)
MCH: 30.5 pg (ref 26.0–34.0)
MCV: 88 fL (ref 78.0–100.0)
Monocytes Absolute: 0.4 10*3/uL (ref 0.1–1.0)
Platelets: 198 10*3/uL (ref 150–400)
RDW: 13.1 % (ref 11.5–15.5)
WBC: 4.8 10*3/uL (ref 4.0–10.5)

## 2011-10-09 LAB — LIPID PANEL: LDL Cholesterol: 71 mg/dL (ref 0–99)

## 2011-10-09 LAB — HEPATIC FUNCTION PANEL
AST: 13 U/L (ref 0–37)
Alkaline Phosphatase: 83 U/L (ref 39–117)
Indirect Bilirubin: 0.5 mg/dL (ref 0.0–0.9)
Total Bilirubin: 0.6 mg/dL (ref 0.3–1.2)

## 2011-10-09 LAB — BASIC METABOLIC PANEL
BUN: 24 mg/dL — ABNORMAL HIGH (ref 6–23)
CO2: 27 mEq/L (ref 19–32)
Calcium: 8.9 mg/dL (ref 8.4–10.5)
Chloride: 108 mEq/L (ref 96–112)
Creat: 1.08 mg/dL (ref 0.50–1.35)

## 2011-10-09 MED ORDER — AMLODIPINE BESYLATE 5 MG PO TABS: 5.0000 mg | ORAL_TABLET | Freq: Every day | ORAL | Status: DC

## 2011-10-09 NOTE — Patient Instructions (Signed)
Please schedule fasting labs prior to next visit Lipid/lft-272.4 

## 2011-10-10 LAB — URINALYSIS, ROUTINE W REFLEX MICROSCOPIC
Bilirubin Urine: NEGATIVE
Glucose, UA: NEGATIVE mg/dL
Ketones, ur: NEGATIVE mg/dL
Specific Gravity, Urine: 1.023 (ref 1.005–1.030)
pH: 6 (ref 5.0–8.0)

## 2011-10-11 NOTE — Assessment & Plan Note (Signed)
Obtain lipid/lft. 

## 2011-10-11 NOTE — Progress Notes (Signed)
  Subjective:    Patient ID: Micheal Mcintyre, male    DOB: Sep 19, 1940, 71 y.o.   MRN: [id number]  HPI Pt presents to clinic for followup of multiple medical problems. BP running borderline low. Has intermittent dizziness. Taking 1/2 dose crestor QOD.   Past Medical History  Diagnosis Date  . Arthritis   . GERD (gastroesophageal reflux disease)   . Hypertension    Past Surgical History  Procedure Date  . Cholecystectomy   . Hip surgery   . Total hip arthroplasty   . Joint replacement     reports that he has never smoked. He has never used smokeless tobacco. He reports that he drinks alcohol. He reports that he does not use illicit drugs. family history includes Diabetes in his maternal uncle and Heart attack (age of onset:67) in his father.  There is no history of Hypertension, and Breast cancer, and Colon cancer, and Prostate cancer, . No Known Allergies    Review of Systems see hpi     Objective:   Physical Exam  Physical Exam  Nursing note and vitals reviewed. Constitutional: He appears well-developed and well-nourished. No distress.  HENT:  Head: Normocephalic and atraumatic.  Right Ear: Tympanic membrane and external ear normal.  Left Ear: Tympanic membrane and external ear normal.  Nose: Nose normal.  Mouth/Throat: Uvula is midline, oropharynx is clear and moist and mucous membranes are normal. No oropharyngeal exudate.  Eyes: Conjunctivae and EOM are normal. Pupils are equal, round, and reactive to light. Right eye exhibits no discharge. Left eye exhibits no discharge. No scleral icterus.  Neck: Neck supple. Carotid bruit is not present. No thyromegaly present.  Cardiovascular: Normal rate, regular rhythm and normal heart sounds.  Exam reveals no gallop and no friction rub.   No murmur heard. Pulmonary/Chest: Effort normal and breath sounds normal. No respiratory distress. He has no wheezes. He has no rales.  Abdominal: Soft. He exhibits no distension and no mass. There  is no hepatosplenomegaly. There is no tenderness. There is no rebound. Hernia confirmed negative in the right inguinal area and confirmed negative in the left inguinal area.   Neurological: He is alert.  Skin: Skin is warm and dry. He is not diaphoretic.  Psychiatric: He has a normal mood and affect.        Assessment & Plan:

## 2011-10-11 NOTE — Assessment & Plan Note (Signed)
Decrease norvasc dose 1/2 tablet. Monitor bp.

## 2011-10-11 NOTE — Assessment & Plan Note (Signed)
Obtain tsh  

## 2011-10-12 ENCOUNTER — Other Ambulatory Visit: Payer: Self-pay | Admitting: Internal Medicine

## 2011-10-12 DIAGNOSIS — E785 Hyperlipidemia, unspecified: Secondary | ICD-10-CM

## 2011-10-15 DIAGNOSIS — H04129 Dry eye syndrome of unspecified lacrimal gland: Secondary | ICD-10-CM | POA: Diagnosis not present

## 2011-10-19 ENCOUNTER — Other Ambulatory Visit: Payer: Self-pay | Admitting: Internal Medicine

## 2011-10-19 DIAGNOSIS — E785 Hyperlipidemia, unspecified: Secondary | ICD-10-CM

## 2011-10-19 DIAGNOSIS — R739 Hyperglycemia, unspecified: Secondary | ICD-10-CM

## 2011-10-28 ENCOUNTER — Telehealth: Payer: Self-pay | Admitting: Internal Medicine

## 2011-10-28 MED ORDER — TELMISARTAN 80 MG PO TABS: 80.0000 mg | ORAL_TABLET | Freq: Every day | ORAL | Status: DC

## 2011-10-28 NOTE — Telephone Encounter (Signed)
Refill sent to pharmacy.   

## 2011-10-28 NOTE — Telephone Encounter (Signed)
Refill- micardis 80mg  oral tablet. Take one tablet daily. Qty 30 last fill 5.14.13

## 2011-11-10 DIAGNOSIS — R7309 Other abnormal glucose: Secondary | ICD-10-CM | POA: Diagnosis not present

## 2011-11-10 DIAGNOSIS — E785 Hyperlipidemia, unspecified: Secondary | ICD-10-CM | POA: Diagnosis not present

## 2011-11-10 NOTE — Addendum Note (Signed)
Addended byDuaine Dredge, Shaiann Mcmanamon L on: 11/10/2011 11:19 AM   Modules accepted: Orders

## 2011-11-11 LAB — BASIC METABOLIC PANEL
BUN: 18 mg/dL (ref 6–23)
CO2: 25 mEq/L (ref 19–32)
Chloride: 106 mEq/L (ref 96–112)
Creat: 1.13 mg/dL (ref 0.50–1.35)
Potassium: 4.5 mEq/L (ref 3.5–5.3)

## 2011-11-11 LAB — HEPATIC FUNCTION PANEL
Alkaline Phosphatase: 62 U/L (ref 39–117)
Indirect Bilirubin: 0.9 mg/dL (ref 0.0–0.9)
Total Bilirubin: 1.1 mg/dL (ref 0.3–1.2)
Total Protein: 6.8 g/dL (ref 6.0–8.3)

## 2011-11-11 LAB — LIPID PANEL
LDL Cholesterol: 69 mg/dL (ref 0–99)
VLDL: 22 mg/dL (ref 0–40)

## 2011-11-11 LAB — HEMOGLOBIN A1C: Mean Plasma Glucose: 120 mg/dL — ABNORMAL HIGH (ref <117)

## 2011-11-13 ENCOUNTER — Telehealth: Payer: Self-pay | Admitting: Internal Medicine

## 2011-11-13 MED ORDER — ROSUVASTATIN CALCIUM 20 MG PO TABS: 20.0000 mg | ORAL_TABLET | Freq: Every day | ORAL | Status: DC

## 2011-11-13 NOTE — Telephone Encounter (Signed)
Refill-crestor 20mg  tab. Take one tablet daily. Qty 30 last fill 2.26.13

## 2011-11-13 NOTE — Telephone Encounter (Signed)
REFILL SENT TO [facility]    CRESTOR

## 2012-02-18 DIAGNOSIS — Z23 Encounter for immunization: Secondary | ICD-10-CM | POA: Diagnosis not present

## 2012-05-13 ENCOUNTER — Telehealth: Payer: Self-pay | Admitting: Internal Medicine

## 2012-05-13 ENCOUNTER — Encounter: Payer: Self-pay | Admitting: Internal Medicine

## 2012-05-13 ENCOUNTER — Ambulatory Visit (INDEPENDENT_AMBULATORY_CARE_PROVIDER_SITE_OTHER): Payer: Medicare Other | Admitting: Internal Medicine

## 2012-05-13 VITALS — BP 128/82 | HR 66 | Temp 98.1°F | Resp 16 | Ht 70.0 in | Wt 162.0 lb

## 2012-05-13 DIAGNOSIS — R7309 Other abnormal glucose: Secondary | ICD-10-CM | POA: Diagnosis not present

## 2012-05-13 DIAGNOSIS — R739 Hyperglycemia, unspecified: Secondary | ICD-10-CM

## 2012-05-13 DIAGNOSIS — E785 Hyperlipidemia, unspecified: Secondary | ICD-10-CM

## 2012-05-13 DIAGNOSIS — I10 Essential (primary) hypertension: Secondary | ICD-10-CM | POA: Diagnosis not present

## 2012-05-13 MED ORDER — SILDENAFIL CITRATE 50 MG PO TABS: 50.0000 mg | ORAL_TABLET | Freq: Every day | ORAL | Status: DC | PRN

## 2012-05-13 NOTE — Patient Instructions (Signed)
Please schedule fasting labs for Monday A1c, chem7-hyperglycemia and lipid-272.4

## 2012-05-13 NOTE — Telephone Encounter (Signed)
Please schedule fasting labs for Monday  A1c, chem7-hyperglycemia and lipid-272.4  Patient has upcoming appointment in may. He will be going to [facility] lab

## 2012-05-17 ENCOUNTER — Telehealth: Payer: Self-pay | Admitting: Internal Medicine

## 2012-05-17 DIAGNOSIS — E785 Hyperlipidemia, unspecified: Secondary | ICD-10-CM | POA: Diagnosis not present

## 2012-05-17 DIAGNOSIS — R7309 Other abnormal glucose: Secondary | ICD-10-CM

## 2012-05-17 LAB — LIPID PANEL
Cholesterol: 174 mg/dL (ref 0–200)
LDL Cholesterol: 102 mg/dL — ABNORMAL HIGH (ref 0–99)
Total CHOL/HDL Ratio: 3.6 Ratio
Triglycerides: 117 mg/dL (ref <150)
VLDL: 23 mg/dL (ref 0–40)

## 2012-05-17 LAB — BASIC METABOLIC PANEL
BUN: 22 mg/dL (ref 6–23)
Chloride: 106 mEq/L (ref 96–112)
Potassium: 4.6 mEq/L (ref 3.5–5.3)
Sodium: 141 mEq/L (ref 135–145)

## 2012-05-17 LAB — HEMOGLOBIN A1C: Hgb A1c MFr Bld: 6.1 % — ABNORMAL HIGH (ref <5.7)

## 2012-05-17 NOTE — Telephone Encounter (Signed)
Pt presented to the lab. Orders entered per 05/13/12 office note as below;  Please schedule fasting labs for Monday  A1c, chem7-hyperglycemia and lipid-272.4

## 2012-05-25 DIAGNOSIS — R739 Hyperglycemia, unspecified: Secondary | ICD-10-CM | POA: Insufficient documentation

## 2012-05-25 NOTE — Assessment & Plan Note (Signed)
Obtain chem7 and a1c 

## 2012-05-25 NOTE — Assessment & Plan Note (Signed)
Normotensive and stable. Continue current regimen. Monitor bp as outpt and followup in clinic as scheduled.  

## 2012-05-25 NOTE — Progress Notes (Signed)
  Subjective:    Patient ID: Micheal Mcintyre, male    DOB: 1940-11-03, 72 y.o.   MRN: [id number]  HPI Pt presents to clinic for followup of multiple medical problems. BP reviewed as normotensive. Received flu vaccine for the season.   Past Medical History  Diagnosis Date  . Arthritis   . GERD (gastroesophageal reflux disease)   . Hypertension    Past Surgical History  Procedure Date  . Cholecystectomy   . Hip surgery   . Total hip arthroplasty   . Joint replacement     reports that he has never smoked. He has never used smokeless tobacco. He reports that he drinks alcohol. He reports that he does not use illicit drugs. family history includes Diabetes in his maternal uncle and Heart attack (age of onset:67) in his father.  There is no history of Hypertension, and Breast cancer, and Colon cancer, and Prostate cancer, . No Known Allergies    Review of Systems see hpi     Objective:   Physical Exam  Physical Exam  Nursing note and vitals reviewed. Constitutional: Appears well-developed and well-nourished. No distress.  HENT:  Head: Normocephalic and atraumatic.  Right Ear: External ear normal.  Left Ear: External ear normal.  Eyes: Conjunctivae are normal. No scleral icterus.  Neck: Neck supple. Carotid bruit is not present.  Cardiovascular: Normal rate, regular rhythm and normal heart sounds.  Exam reveals no gallop and no friction rub.   No murmur heard. Pulmonary/Chest: Effort normal and breath sounds normal. No respiratory distress. He has no wheezes. no rales.  Lymphadenopathy:    He has no cervical adenopathy.  Neurological:Alert.  Skin: Skin is warm and dry. Not diaphoretic.  Psychiatric: Has a normal mood and affect.        Assessment & Plan:

## 2012-05-25 NOTE — Assessment & Plan Note (Signed)
Obtain lipid profile. 

## 2012-07-25 ENCOUNTER — Telehealth: Payer: Self-pay | Admitting: Internal Medicine

## 2012-07-25 MED ORDER — TELMISARTAN 80 MG PO TABS: 80.0000 mg | ORAL_TABLET | Freq: Every day | ORAL | Status: DC

## 2012-07-25 NOTE — Telephone Encounter (Signed)
micardis 80 mg tablets (new) take 1 tablet by mouth every day qty 30 last fill 06-17-2012

## 2012-09-13 ENCOUNTER — Ambulatory Visit: Payer: Medicare Other | Admitting: Internal Medicine

## 2012-09-15 ENCOUNTER — Ambulatory Visit (INDEPENDENT_AMBULATORY_CARE_PROVIDER_SITE_OTHER): Payer: Medicare Other | Admitting: Family Medicine

## 2012-09-15 ENCOUNTER — Encounter: Payer: Self-pay | Admitting: Family Medicine

## 2012-09-15 VITALS — BP 104/62 | HR 84 | Temp 97.9°F | Ht 70.0 in | Wt 162.0 lb

## 2012-09-15 DIAGNOSIS — E039 Hypothyroidism, unspecified: Secondary | ICD-10-CM

## 2012-09-15 DIAGNOSIS — I10 Essential (primary) hypertension: Secondary | ICD-10-CM | POA: Diagnosis not present

## 2012-09-15 DIAGNOSIS — R7309 Other abnormal glucose: Secondary | ICD-10-CM | POA: Diagnosis not present

## 2012-09-15 DIAGNOSIS — R739 Hyperglycemia, unspecified: Secondary | ICD-10-CM

## 2012-09-15 DIAGNOSIS — E785 Hyperlipidemia, unspecified: Secondary | ICD-10-CM

## 2012-09-15 MED ORDER — TELMISARTAN 40 MG PO TABS: 40.0000 mg | ORAL_TABLET | Freq: Every day | ORAL | Status: DC

## 2012-09-15 NOTE — Progress Notes (Signed)
Patient ID: Micheal Mcintyre, male   DOB: 1940-12-07, 72 y.o.   MRN: [id number] Micheal Mcintyre [id number] Apr 09, 1941 09/15/2012      Progress Note-Follow Up  Subjective  Chief Complaint  Chief Complaint  Patient presents with  . Follow-up    4 month    HPI  Patient is a 72 Caucasian male in today for followup generally doing well although he did have an episode of lightheadedness yesterday. He had been working in ER gotten somewhat dehydrated upon standing quickly felt lightheaded but not syncopal. No headache or other neurologic complaints. This has not been recurrent. Otherwise she's felt well. No recent illness. No fevers or chills. No chest pain, palpitations, shortness of breath, GI or GU complaints.  Past Medical History  Diagnosis Date  . Arthritis   . GERD (gastroesophageal reflux disease)   . Hypertension     Past Surgical History  Procedure Laterality Date  . Cholecystectomy    . Hip surgery    . Total hip arthroplasty    . Joint replacement      Family History  Problem Relation Age of Onset  . Heart attack Father 19    deceased  . Hypertension Neg Hx   . Breast cancer Neg Hx   . Colon cancer Neg Hx   . Diabetes Maternal Uncle     maternal grandfather  . Prostate cancer Neg Hx     History   Social History  . Marital Status: Married    Spouse Name: N/A    Number of Children: N/A  . Years of Education: N/A   Occupational History  . Not on file.   Social History Main Topics  . Smoking status: Never Smoker   . Smokeless tobacco: Never Used  . Alcohol Use: Yes     Comment: occ  . Drug Use: No  . Sexually Active: Yes   Other Topics Concern  . Not on file   Social History Narrative  . No narrative on file    Current Outpatient Prescriptions on File Prior to Visit  Medication Sig Dispense Refill  . aspirin 81 MG tablet Take 81 mg by mouth daily as needed.       . sildenafil (VIAGRA) 50 MG tablet Take 1 tablet (50 mg total) by mouth daily as needed.   10 tablet  4  . telmisartan (MICARDIS) 80 MG tablet Take 1 tablet (80 mg total) by mouth daily.  30 tablet  6   No current facility-administered medications on file prior to visit.    No Known Allergies  Review of Systems  Review of Systems  Constitutional: Negative for fever and malaise/fatigue.  HENT: Negative for congestion.   Eyes: Negative for discharge.  Respiratory: Negative for shortness of breath.   Cardiovascular: Negative for chest pain, palpitations and leg swelling.  Gastrointestinal: Negative for nausea, abdominal pain and diarrhea.  Genitourinary: Negative for dysuria.  Musculoskeletal: Negative for falls.  Skin: Negative for rash.  Neurological: Negative for loss of consciousness and headaches.  Endo/Heme/Allergies: Negative for polydipsia.  Psychiatric/Behavioral: Negative for depression and suicidal ideas. The patient is not nervous/anxious and does not have insomnia.     Objective  BP 104/62  Pulse 84  Temp(Src) 97.9 F (36.6 C) (Oral)  Ht 5\' 10"  (1.778 m)  Wt 162 lb (73.483 kg)  BMI 23.24 kg/m2  SpO2 96%  Physical Exam  Physical Exam  Constitutional: He is oriented to person, place, and time and well-developed, well-nourished, and in no  distress. No distress.  HENT:  Head: Normocephalic and atraumatic.  Eyes: Conjunctivae are normal.  Neck: Neck supple. No thyromegaly present.  Cardiovascular: Normal rate, regular rhythm and normal heart sounds.   No murmur heard. Pulmonary/Chest: Effort normal and breath sounds normal. No respiratory distress.  Abdominal: He exhibits no distension and no mass. There is no tenderness.  Musculoskeletal: He exhibits no edema.  Neurological: He is alert and oriented to person, place, and time.  Skin: Skin is warm.  Psychiatric: Memory, affect and judgment normal.    Lab Results  Component Value Date   TSH 0.84 09/16/2010   Lab Results  Component Value Date   WBC 4.8 10/09/2011   HGB 13.4 10/09/2011   HCT 38.7*  10/09/2011   MCV 88.0 10/09/2011   PLT 198 10/09/2011   Lab Results  Component Value Date   CREATININE 1.01 05/17/2012   BUN 22 05/17/2012   NA 141 05/17/2012   K 4.6 05/17/2012   CL 106 05/17/2012   CO2 29 05/17/2012   Lab Results  Component Value Date   ALT 12 11/10/2011   AST 10 11/10/2011   ALKPHOS 62 11/10/2011   BILITOT 1.1 11/10/2011   Lab Results  Component Value Date   CHOL 174 05/17/2012   Lab Results  Component Value Date   HDL 49 05/17/2012   Lab Results  Component Value Date   LDLCALC 102* 05/17/2012   Lab Results  Component Value Date   TRIG 117 05/17/2012   Lab Results  Component Value Date   CHOLHDL 3.6 05/17/2012     Assessment & Plan  HYPERTENSION Well controlled, no changes.  HYPOTHYROIDISM tsh wnl without meds.  HYPERLIPIDEMIA Tolerating Crestor, mild elevation of ldl cholesterol consider krill oil caps, avoid trans fats.  Hyperglycemia hgba1c 5.8, minimize simple carbs.

## 2012-09-15 NOTE — Patient Instructions (Addendum)
Consider Krill oil caps, such as MegaRed caps daily   Cholesterol Cholesterol is a white, waxy, fat-like protein needed by your body in small amounts. The liver makes all the cholesterol you need. It is carried from the liver by the blood through the blood vessels. Deposits (plaque) may build up on blood vessel walls. This makes the arteries narrower and stiffer. Plaque increases the risk for heart attack and stroke. You cannot feel your cholesterol level even if it is very high. The only way to know is by a blood test to check your lipid (fats) levels. Once you know your cholesterol levels, you should keep a record of the test results. Work with your caregiver to to keep your levels in the desired range. WHAT THE RESULTS MEAN:  Total cholesterol is a rough measure of all the cholesterol in your blood.  LDL is the so-called bad cholesterol. This is the type that deposits cholesterol in the walls of the arteries. You want this level to be low.  HDL is the good cholesterol because it cleans the arteries and carries the LDL away. You want this level to be high.  Triglycerides are fat that the body can either burn for energy or store. High levels are closely linked to heart disease. DESIRED LEVELS:  Total cholesterol below 200.  LDL below 100 for people at risk, below 70 for very high risk.  HDL above 50 is good, above 60 is best.  Triglycerides below 150. HOW TO LOWER YOUR CHOLESTEROL:  Diet.  Choose fish or white meat chicken and [place], roasted or baked. Limit fatty cuts of red meat, fried foods, and processed meats, such as sausage and lunch meat.  Eat lots of fresh fruits and vegetables. Choose whole grains, beans, pasta, potatoes and cereals.  Use only small amounts of olive, corn or canola oils. Avoid butter, mayonnaise, shortening or palm kernel oils. Avoid foods with trans-fats.  Use skim/nonfat milk and low-fat/nonfat yogurt and cheeses. Avoid whole milk, cream, ice cream,  egg yolks and cheeses. Healthy desserts include angel food cake, ginger snaps, animal crackers, hard candy, popsicles, and low-fat/nonfat frozen yogurt. Avoid pastries, cakes, pies and cookies.  Exercise.  A regular program helps decrease LDL and raises HDL.  Helps with weight control.  Do things that increase your activity level like gardening, walking, or taking the stairs.  Medication.  May be prescribed by your caregiver to help lowering cholesterol and the risk for heart disease.  You may need medicine even if your levels are normal if you have several risk factors. HOME CARE INSTRUCTIONS   Follow your diet and exercise programs as suggested by your caregiver.  Take medications as directed.  Have blood work done when your caregiver feels it is necessary. MAKE SURE YOU:   Understand these instructions.  Will watch your condition.  Will get help right away if you are not doing well or get worse. Document Released: 01/13/2001 Document Revised: 07/13/2011 Document Reviewed: 07/06/2007 [facility] Patient Information 2013 [place], [place].  DASH Diet The DASH diet stands for "Dietary Approaches to Stop Hypertension." It is a healthy eating plan that has been shown to reduce high blood pressure (hypertension) in as little as 14 days, while also possibly providing other significant health benefits. These other health benefits include reducing the risk of breast cancer after menopause and reducing the risk of type 2 diabetes, heart disease, colon cancer, and stroke. Health benefits also include weight loss and slowing kidney failure in patients with chronic kidney  disease.  DIET GUIDELINES  Limit salt (sodium). Your diet should contain less than 1500 mg of sodium daily.  Limit refined or processed carbohydrates. Your diet should include mostly whole grains. Desserts and added sugars should be used sparingly.  Include small amounts of heart-healthy fats. These types of fats include  nuts, oils, and tub margarine. Limit saturated and trans fats. These fats have been shown to be harmful in the body. CHOOSING FOODS  The following food groups are based on a 2000 calorie diet. See your Registered Dietitian for individual calorie needs. Grains and Grain Products (6 to 8 servings daily)  Eat More Often: Whole-wheat bread, brown rice, whole-grain or wheat pasta, quinoa, popcorn without added fat or salt (air popped).  Eat Less Often: White bread, white pasta, white rice, cornbread. Vegetables (4 to 5 servings daily)  Eat More Often: Fresh, frozen, and canned vegetables. Vegetables may be raw, steamed, roasted, or grilled with a minimal amount of fat.  Eat Less Often/Avoid: Creamed or fried vegetables. Vegetables in a cheese sauce. Fruit (4 to 5 servings daily)  Eat More Often: All fresh, canned (in natural juice), or frozen fruits. Dried fruits without added sugar. One hundred percent fruit juice ( cup [237 mL] daily).  Eat Less Often: Dried fruits with added sugar. Canned fruit in light or heavy syrup. [employer], Fish, and Poultry (2 servings or less daily. One serving is 3 to 4 oz [85-114 g]).  Eat More Often: Ninety percent or leaner ground beef, tenderloin, sirloin. Round cuts of beef, chicken breast, [place] breast. All fish. Grill, bake, or broil your meat. Nothing should be fried.  Eat Less Often/Avoid: Fatty cuts of meat, [place], or chicken leg, thigh, or wing. Fried cuts of meat or fish. Dairy (2 to 3 servings)  Eat More Often: Low-fat or fat-free milk, low-fat plain or light yogurt, reduced-fat or part-skim cheese.  Eat Less Often/Avoid: Milk (whole, 2%).Whole milk yogurt. Full-fat cheeses. Nuts, Seeds, and Legumes (4 to 5 servings per week)  Eat More Often: All without added salt.  Eat Less Often/Avoid: Salted nuts and seeds, canned beans with added salt. Fats and Sweets (limited)  Eat More Often: Vegetable oils, tub margarines without trans fats,  sugar-free gelatin. Mayonnaise and salad dressings.  Eat Less Often/Avoid: Coconut oils, palm oils, butter, stick margarine, cream, half and half, cookies, candy, pie. FOR MORE INFORMATION The Dash Diet Eating Plan: www.dashdiet.org Document Released: 04/09/2011 Document Revised: 07/13/2011 Document Reviewed: 04/09/2011 [facility] Patient Information 2013 [place], [place].

## 2012-09-16 DIAGNOSIS — I10 Essential (primary) hypertension: Secondary | ICD-10-CM | POA: Diagnosis not present

## 2012-09-16 DIAGNOSIS — R7309 Other abnormal glucose: Secondary | ICD-10-CM | POA: Diagnosis not present

## 2012-09-16 DIAGNOSIS — E785 Hyperlipidemia, unspecified: Secondary | ICD-10-CM | POA: Diagnosis not present

## 2012-09-16 LAB — LIPID PANEL
Cholesterol: 177 mg/dL (ref 0–200)
HDL: 52 mg/dL (ref >39)

## 2012-09-16 LAB — CBC
MCHC: 34.7 g/dL (ref 30.0–36.0)
Platelets: 148 10*3/uL — ABNORMAL LOW (ref 150–400)
RDW: 13.4 % (ref 11.5–15.5)
WBC: 3.8 10*3/uL — ABNORMAL LOW (ref 4.0–10.5)

## 2012-09-16 LAB — HEMOGLOBIN A1C
Hgb A1c MFr Bld: 5.8 % — ABNORMAL HIGH (ref <5.7)
Mean Plasma Glucose: 120 mg/dL — ABNORMAL HIGH (ref <117)

## 2012-09-16 LAB — RENAL FUNCTION PANEL
BUN: 20 mg/dL (ref 6–23)
Calcium: 9.7 mg/dL (ref 8.4–10.5)
Phosphorus: 3.6 mg/dL (ref 2.3–4.6)
Potassium: 4.3 mEq/L (ref 3.5–5.3)
Sodium: 142 mEq/L (ref 135–145)

## 2012-09-16 LAB — HEPATIC FUNCTION PANEL
ALT: 13 U/L (ref 0–53)
Bilirubin, Direct: 0.1 mg/dL (ref 0.0–0.3)

## 2012-09-16 LAB — TSH: TSH: 1.71 u[IU]/mL (ref 0.350–4.500)

## 2012-09-17 NOTE — Assessment & Plan Note (Signed)
Tolerating Crestor, mild elevation of ldl cholesterol consider krill oil caps, avoid trans fats.

## 2012-09-17 NOTE — Assessment & Plan Note (Signed)
tsh wnl without meds.

## 2012-09-17 NOTE — Assessment & Plan Note (Signed)
hgba1c 5.8, minimize simple carbs.

## 2012-09-17 NOTE — Assessment & Plan Note (Signed)
Well controlled, no changes 

## 2012-09-19 ENCOUNTER — Encounter: Payer: Self-pay | Admitting: *Deleted

## 2013-01-24 ENCOUNTER — Ambulatory Visit (INDEPENDENT_AMBULATORY_CARE_PROVIDER_SITE_OTHER): Payer: Medicare Other | Admitting: Family Medicine

## 2013-01-24 ENCOUNTER — Encounter: Payer: Self-pay | Admitting: Family Medicine

## 2013-01-24 VITALS — BP 138/82 | HR 62 | Temp 97.6°F | Ht 70.0 in | Wt 164.0 lb

## 2013-01-24 DIAGNOSIS — Z23 Encounter for immunization: Secondary | ICD-10-CM | POA: Diagnosis not present

## 2013-01-24 DIAGNOSIS — I10 Essential (primary) hypertension: Secondary | ICD-10-CM | POA: Diagnosis not present

## 2013-01-24 DIAGNOSIS — D649 Anemia, unspecified: Secondary | ICD-10-CM

## 2013-01-24 DIAGNOSIS — R7309 Other abnormal glucose: Secondary | ICD-10-CM

## 2013-01-24 DIAGNOSIS — R739 Hyperglycemia, unspecified: Secondary | ICD-10-CM

## 2013-01-24 DIAGNOSIS — E039 Hypothyroidism, unspecified: Secondary | ICD-10-CM | POA: Diagnosis not present

## 2013-01-24 DIAGNOSIS — E785 Hyperlipidemia, unspecified: Secondary | ICD-10-CM | POA: Diagnosis not present

## 2013-01-24 MED ORDER — TELMISARTAN 80 MG PO TABS: 40.0000 mg | ORAL_TABLET | Freq: Every day | ORAL | Status: DC

## 2013-01-24 NOTE — Progress Notes (Signed)
Patient ID: Micheal Mcintyre, male   DOB: [birth year], 72 y.o.   MRN: [id number] LADALE SHERBURN [id number] 1940/12/24 01/24/2013      Progress Note-Follow Up  Subjective  Chief Complaint  Chief Complaint  Patient presents with  . Follow-up    4 month  . Injections    flu    HPI  Patient is a 72 year old Caucasian male in today for followup. Feeling well. No recent illness. No fevers or chills. No chest pain or palpitations, shortness or breath, GI or GU complaints. Take medications as prescribed. Agrees to flu shot today  Past Medical History  Diagnosis Date  . Arthritis   . GERD (gastroesophageal reflux disease)   . Hypertension     Past Surgical History  Procedure Laterality Date  . Cholecystectomy    . Hip surgery    . Total hip arthroplasty    . Joint replacement      Family History  Problem Relation Age of Onset  . Heart attack Father 48    deceased  . Hypertension Neg Hx   . Breast cancer Neg Hx   . Colon cancer Neg Hx   . Diabetes Maternal Uncle     maternal grandfather  . Prostate cancer Neg Hx     History   Social History  . Marital Status: Married    Spouse Name: N/A    Number of Children: N/A  . Years of Education: N/A   Occupational History  . Not on file.   Social History Main Topics  . Smoking status: Never Smoker   . Smokeless tobacco: Never Used  . Alcohol Use: Yes     Comment: occ  . Drug Use: No  . Sexual Activity: Yes   Other Topics Concern  . Not on file   Social History Narrative  . No narrative on file    Current Outpatient Prescriptions on File Prior to Visit  Medication Sig Dispense Refill  . aspirin 81 MG tablet Take 81 mg by mouth daily as needed.       . sildenafil (VIAGRA) 50 MG tablet Take 1 tablet (50 mg total) by mouth daily as needed.  10 tablet  4  . telmisartan (MICARDIS) 80 MG tablet Take 1 tablet (80 mg total) by mouth daily.  30 tablet  6  . telmisartan (MICARDIS) 40 MG tablet Take 1 tablet (40 mg total) by  mouth daily.  30 tablet  0   No current facility-administered medications on file prior to visit.    No Known Allergies  Review of Systems  Review of Systems  Constitutional: Negative for fever and malaise/fatigue.  HENT: Negative for congestion.   Eyes: Negative for discharge.  Respiratory: Negative for shortness of breath.   Cardiovascular: Negative for chest pain, palpitations and leg swelling.  Gastrointestinal: Negative for nausea, abdominal pain and diarrhea.  Genitourinary: Negative for dysuria.  Musculoskeletal: Negative for falls.  Skin: Negative for rash.  Neurological: Negative for loss of consciousness and headaches.  Endo/Heme/Allergies: Negative for polydipsia.  Psychiatric/Behavioral: Negative for depression and suicidal ideas. The patient is not nervous/anxious and does not have insomnia.     Objective  BP 138/82  Pulse 62  Temp(Src) 97.6 F (36.4 C) (Oral)  Ht 5\' 10"  (1.778 m)  Wt 164 lb 0.6 oz (74.408 kg)  BMI 23.54 kg/m2  SpO2 97%  Physical Exam  Physical Exam  Constitutional: He is oriented to person, place, and time and well-developed, well-nourished, and in no distress. No  distress.  HENT:  Head: Normocephalic and atraumatic.  Eyes: Conjunctivae are normal.  Neck: Neck supple. No thyromegaly present.  Cardiovascular: Normal rate, regular rhythm and normal heart sounds.   No murmur heard. Pulmonary/Chest: Effort normal and breath sounds normal. No respiratory distress.  Abdominal: He exhibits no distension and no mass. There is no tenderness.  Musculoskeletal: He exhibits no edema.  Neurological: He is alert and oriented to person, place, and time.  Skin: Skin is warm.  Psychiatric: Memory, affect and judgment normal.    Lab Results  Component Value Date   TSH 1.710 09/16/2012   Lab Results  Component Value Date   WBC 3.8* 09/16/2012   HGB 14.1 09/16/2012   HCT 40.6 09/16/2012   MCV 89.8 09/16/2012   PLT 148* 09/16/2012   Lab Results   Component Value Date   CREATININE 1.25 09/16/2012   BUN 20 09/16/2012   NA 142 09/16/2012   K 4.3 09/16/2012   CL 105 09/16/2012   CO2 30 09/16/2012   Lab Results  Component Value Date   ALT 13 09/16/2012   AST 11 09/16/2012   ALKPHOS 69 09/16/2012   BILITOT 0.7 09/16/2012   Lab Results  Component Value Date   CHOL 177 09/16/2012   Lab Results  Component Value Date   HDL 52 09/16/2012   Lab Results  Component Value Date   LDLCALC 105* 09/16/2012   Lab Results  Component Value Date   TRIG 99 09/16/2012   Lab Results  Component Value Date   CHOLHDL 3.4 09/16/2012     Assessment & Plan  HYPERLIPIDEMIA Mild elevation ldl, avoid trans fats, start krill oil increase exercise.   UNSPECIFIED ANEMIA resolved  Hyperglycemia Mild, decreased hgba1c, avoid simple carbs.  HYPERTENSION Well controlled, no changes to meds.

## 2013-01-24 NOTE — Patient Instructions (Signed)

## 2013-01-27 NOTE — Assessment & Plan Note (Signed)
Mild, decreased hgba1c, avoid simple carbs.

## 2013-01-27 NOTE — Assessment & Plan Note (Signed)
Mild elevation ldl, avoid trans fats, start krill oil increase exercise.

## 2013-01-27 NOTE — Assessment & Plan Note (Signed)
Well controlled, no changes to meds 

## 2013-01-27 NOTE — Assessment & Plan Note (Signed)
resolved 

## 2013-05-19 ENCOUNTER — Telehealth: Payer: Self-pay

## 2013-05-19 DIAGNOSIS — E039 Hypothyroidism, unspecified: Secondary | ICD-10-CM | POA: Diagnosis not present

## 2013-05-19 DIAGNOSIS — E785 Hyperlipidemia, unspecified: Secondary | ICD-10-CM | POA: Diagnosis not present

## 2013-05-19 DIAGNOSIS — R7309 Other abnormal glucose: Secondary | ICD-10-CM | POA: Diagnosis not present

## 2013-05-19 DIAGNOSIS — I10 Essential (primary) hypertension: Secondary | ICD-10-CM | POA: Diagnosis not present

## 2013-05-19 DIAGNOSIS — R739 Hyperglycemia, unspecified: Secondary | ICD-10-CM

## 2013-05-19 DIAGNOSIS — D649 Anemia, unspecified: Secondary | ICD-10-CM

## 2013-05-19 LAB — CBC
HEMATOCRIT: 41.6 % (ref 39.0–52.0)
HEMOGLOBIN: 14.2 g/dL (ref 13.0–17.0)
MCH: 30.7 pg (ref 26.0–34.0)
MCHC: 34.1 g/dL (ref 30.0–36.0)
MCV: 89.8 fL (ref 78.0–100.0)
PLATELETS: 159 10*3/uL (ref 150–400)
RBC: 4.63 MIL/uL (ref 4.22–5.81)
RDW: 13.3 % (ref 11.5–15.5)
WBC: 4.9 10*3/uL (ref 4.0–10.5)

## 2013-05-19 LAB — HEPATIC FUNCTION PANEL
ALT: 16 U/L (ref 0–53)
AST: 11 U/L (ref 0–37)
Albumin: 3.9 g/dL (ref 3.5–5.2)
Alkaline Phosphatase: 61 U/L (ref 39–117)
BILIRUBIN DIRECT: 0.1 mg/dL (ref 0.0–0.3)
BILIRUBIN INDIRECT: 0.5 mg/dL (ref 0.0–0.9)
Total Bilirubin: 0.6 mg/dL (ref 0.3–1.2)
Total Protein: 6.5 g/dL (ref 6.0–8.3)

## 2013-05-19 LAB — LIPID PANEL
Cholesterol: 172 mg/dL (ref 0–200)
HDL: 47 mg/dL (ref >39)
LDL CALC: 98 mg/dL (ref 0–99)
Total CHOL/HDL Ratio: 3.7 Ratio
Triglycerides: 135 mg/dL (ref <150)
VLDL: 27 mg/dL (ref 0–40)

## 2013-05-19 LAB — RENAL FUNCTION PANEL
ALBUMIN: 3.9 g/dL (ref 3.5–5.2)
BUN: 22 mg/dL (ref 6–23)
CHLORIDE: 105 meq/L (ref 96–112)
CO2: 28 meq/L (ref 19–32)
Calcium: 9.1 mg/dL (ref 8.4–10.5)
Creat: 1.16 mg/dL (ref 0.50–1.35)
GLUCOSE: 90 mg/dL (ref 70–99)
Phosphorus: 3.4 mg/dL (ref 2.3–4.6)
Potassium: 4.4 mEq/L (ref 3.5–5.3)
SODIUM: 139 meq/L (ref 135–145)

## 2013-05-19 LAB — HEMOGLOBIN A1C
Hgb A1c MFr Bld: 5.9 % — ABNORMAL HIGH (ref <5.7)
Mean Plasma Glucose: 123 mg/dL — ABNORMAL HIGH (ref <117)

## 2013-05-19 NOTE — Telephone Encounter (Signed)
Lab order placed.

## 2013-05-20 LAB — TSH: TSH: 1.112 u[IU]/mL (ref 0.350–4.500)

## 2013-05-23 ENCOUNTER — Ambulatory Visit: Payer: Medicare Other | Admitting: Family Medicine

## 2013-05-24 ENCOUNTER — Ambulatory Visit (INDEPENDENT_AMBULATORY_CARE_PROVIDER_SITE_OTHER): Payer: Medicare Other | Admitting: Family Medicine

## 2013-05-24 ENCOUNTER — Encounter: Payer: Self-pay | Admitting: Family Medicine

## 2013-05-24 VITALS — BP 122/72 | HR 71 | Temp 97.9°F | Ht 70.0 in | Wt 163.1 lb

## 2013-05-24 DIAGNOSIS — I10 Essential (primary) hypertension: Secondary | ICD-10-CM | POA: Diagnosis not present

## 2013-05-24 DIAGNOSIS — E785 Hyperlipidemia, unspecified: Secondary | ICD-10-CM | POA: Diagnosis not present

## 2013-05-24 DIAGNOSIS — Z23 Encounter for immunization: Secondary | ICD-10-CM

## 2013-05-24 DIAGNOSIS — K219 Gastro-esophageal reflux disease without esophagitis: Secondary | ICD-10-CM

## 2013-05-24 DIAGNOSIS — E039 Hypothyroidism, unspecified: Secondary | ICD-10-CM

## 2013-05-24 DIAGNOSIS — R7309 Other abnormal glucose: Secondary | ICD-10-CM

## 2013-05-24 DIAGNOSIS — D649 Anemia, unspecified: Secondary | ICD-10-CM

## 2013-05-24 DIAGNOSIS — R739 Hyperglycemia, unspecified: Secondary | ICD-10-CM

## 2013-05-24 MED ORDER — PNEUMOCOCCAL 13-VAL CONJ VACC IM SUSP: 0.5000 mL | Freq: Once | INTRAMUSCULAR | Status: DC

## 2013-05-24 NOTE — Progress Notes (Signed)
Pre visit review using our clinic review tool, if applicable. No additional management support is needed unless otherwise documented below in the visit note. 

## 2013-05-24 NOTE — Progress Notes (Signed)
Subjective:     Patient ID: Micheal Mcintyre, male   DOB: [birth year], 73 y.o.   MRN: [id number]   CC: f/u HTN, hyperlipidemia, Erectile dysfunction  HPI 1.  Pt reports following diet and exercise plan for cardiovascular health, management of hyperlipidemia and elevated glucose levels.  Pt swims 5 days per week which he reports helps his Right hip ROM and pain. Pt reports traveling and walking/hiking on days he does not swim. He monitors diet with portion control, Eat Only Half for restaurants and sweets. 2. Pt reports taking ASA qod d/t epigastric and upper abdominal pain that has resolved since switching from daily to qod regimen.  3. Pt reports occasional GERD symptoms with spicy foods and occasional wine but resolves after 30 minutes with water.   Review of Systems  Constitutional: Negative for fever, chills and fatigue.  Cardiovascular: Negative for chest pain.  Gastrointestinal: Negative for nausea, vomiting, diarrhea and constipation.  Musculoskeletal: Positive for gait problem. Negative for myalgias.       H/o right hip pain, improved with regular exercise.   Neurological: Negative for syncope and headaches.   Current Outpatient Prescriptions on File Prior to Visit  Medication Sig Dispense Refill  . aspirin 81 MG tablet Take 81 mg by mouth every other day.       . sildenafil (VIAGRA) 50 MG tablet Take 1 tablet (50 mg total) by mouth daily as needed.  10 tablet  4  . telmisartan (MICARDIS) 80 MG tablet Take 0.5 tablets (40 mg total) by mouth daily.  30 tablet  6   No current facility-administered medications on file prior to visit.   No Known Allergies      Objective:   Physical Exam  Constitutional: He is oriented to person, place, and time. Vital signs are normal. He appears well-developed and well-nourished. No distress.  HENT:  Head: Normocephalic and atraumatic.  Eyes: Lids are normal.  Neck: Carotid bruit is not present.  Cardiovascular: Normal rate, regular rhythm and  normal heart sounds.   Pulmonary/Chest: Effort normal and breath sounds normal.  Abdominal: Bowel sounds are normal.  Neurological: He is alert and oriented to person, place, and time.  Skin: Skin is warm and dry. No rash noted. He is not diaphoretic.  Psychiatric: He has a normal mood and affect. His behavior is normal. Judgment and thought content normal.   Filed Vitals:   05/24/13 0807  BP: 122/72  Pulse: 71  Temp: 97.9 F (36.6 C)       Assessment:      Problem List: 1. HTN controlled with Telmisartan 2. Hyperlipidemia controlled with Krill Oil 3. H/o hyperglycemia, controlled with diet and exercise.  4. ED controlled with Sildenafil     Plan:     Pharm: Continue current medications.  Non-Pharm: Continue to exercise and consume healthy diet, particularly monitoring carbohydrates. Pt advised to try probiotics OTC.  Pt Education: Pt was educated on healthy balanced diet and discussed the risk/benefits of taking ASA qod.   Labs: HgbA1c prior to next viit.  Follow-up: 6 mo to recheck HgbA1c     05/24/2013    [place] Aneya Daddona, PA-S  Patient seen, interviewed and examined with student, agree with documentation  HYPERTENSION Well controlled, no changes  Hyperglycemia hgba1c well controlled, minimize simple carbs  HYPOTHYROIDISM tsh wnl  GERD No significant symptoms. Consider probiotics  UNSPECIFIED ANEMIA resolved

## 2013-05-24 NOTE — Patient Instructions (Addendum)
100-140/60-90 is normal 64 oz clear fluids Probiotics: Digestive Advantage, Align, Culturelle, Florastor  Basic Carbohydrate Counting Basic carbohydrate counting is a way to plan meals. It is done by counting the amount of carbohydrate in foods. Foods that have carbohydrates are starches (grains, beans, starchy vegetables) and sweets. Eating carbohydrates increases blood glucose (sugar) levels. People with diabetes use carbohydrate counting to help keep their blood glucose at a normal level.  COUNTING CARBOHYDRATES IN FOODS The first step in counting carbohydrates is to learn how many carbohydrate servings you should have in every meal. A dietitian can plan this for you. After learning the amount of carbohydrates to include in your meal plan, you can start to choose the carbohydrate-containing foods you want to eat.  There are 2 ways to identify the amount of carbohydrates in the foods you eat.  Read the Nutrition Facts panel on food labels. You need 2 pieces of information from the Nutrition Facts panel to count carbohydrates this way:  Serving size.  Total carbohydrate (in grams). Decide how many servings you will be eating. If it is 1 serving, you will be eating the amount of carbohydrate listed on the panel. If you will be eating 2 servings, you will be eating double the amount of carbohydrate listed on the panel.   Learn serving sizes. A serving size of most carbohydrate-containing foods is about 15 grams (g). Listed below are single serving sizes of common carbohydrate-containing foods:  1 slice bread.   cup unsweetened, dry cereal.   cup hot cereal.   cup rice.   cup mashed potatoes.   cup pasta.  1 cup fresh fruit.   cup canned fruit.  1 cup milk (whole, 2%, or skim).   cup starchy vegetables (peas, corn, or potatoes). Counting carbohydrates this way is similar to looking on the Nutrition Facts panel. Decide how many servings you will eat first. Multiply the number  of servings you eat by 15 g. For example, if you have 2 cups of strawberries, you had 2 servings. That means you had 30 g of carbohydrate (2 servings x 15 g = 30 g). CALCULATING CARBOHYDRATES IN A MEAL Sample dinner  3 oz chicken breast.   cup brown rice.   cup corn.  1 cup fat-free milk.  1 cup strawberries with sugar-free whipped topping. Carbohydrate calculation First, identify the foods that contain carbohydrate:  Rice.  Corn.  Milk.  Strawberries. Calculate the number of servings eaten:  2 servings rice.  1 serving corn.  1 serving milk.  1 serving strawberries. Multiply the number of servings by 15 g:  2 servings rice x 15 g = 30 g.  1 serving corn x 15 g = 15 g.  1 serving milk x 15 g = 15 g.  1 serving strawberries x 15 g = 15 g. Add the amounts to find the total carbohydrates eaten: 30 g + 15 g + 15 g + 15 g = 75 g carbohydrate eaten at dinner. Document Released: 04/20/2005 Document Revised: 07/13/2011 Document Reviewed: 03/06/2011 [facility] Patient Information 2014 [place], [place].

## 2013-05-29 ENCOUNTER — Encounter: Payer: Self-pay | Admitting: Family Medicine

## 2013-05-29 NOTE — Assessment & Plan Note (Signed)
hgba1c well controlled, minimize simple carbs

## 2013-05-29 NOTE — Assessment & Plan Note (Signed)
tsh wnl

## 2013-05-29 NOTE — Assessment & Plan Note (Signed)
No significant symptoms. Consider probiotics

## 2013-05-29 NOTE — Assessment & Plan Note (Signed)
resolved 

## 2013-05-29 NOTE — Assessment & Plan Note (Signed)
Well controlled, no changes 

## 2013-08-14 ENCOUNTER — Other Ambulatory Visit: Payer: Self-pay | Admitting: Internal Medicine

## 2013-09-23 ENCOUNTER — Other Ambulatory Visit: Payer: Self-pay | Admitting: Family Medicine

## 2013-11-21 ENCOUNTER — Ambulatory Visit: Payer: Medicare Other | Admitting: Family Medicine

## 2013-11-23 ENCOUNTER — Ambulatory Visit: Payer: Medicare Other | Admitting: Family Medicine

## 2014-01-01 ENCOUNTER — Encounter: Payer: Self-pay | Admitting: Family Medicine

## 2014-01-01 ENCOUNTER — Ambulatory Visit (INDEPENDENT_AMBULATORY_CARE_PROVIDER_SITE_OTHER): Payer: Medicare Other | Admitting: Family Medicine

## 2014-01-01 VITALS — BP 112/60 | HR 90 | Temp 98.1°F | Ht 70.0 in | Wt 163.2 lb

## 2014-01-01 DIAGNOSIS — R739 Hyperglycemia, unspecified: Secondary | ICD-10-CM

## 2014-01-01 DIAGNOSIS — I10 Essential (primary) hypertension: Secondary | ICD-10-CM | POA: Diagnosis not present

## 2014-01-01 DIAGNOSIS — Z23 Encounter for immunization: Secondary | ICD-10-CM | POA: Diagnosis not present

## 2014-01-01 DIAGNOSIS — R7309 Other abnormal glucose: Secondary | ICD-10-CM

## 2014-01-01 DIAGNOSIS — D649 Anemia, unspecified: Secondary | ICD-10-CM

## 2014-01-01 DIAGNOSIS — K219 Gastro-esophageal reflux disease without esophagitis: Secondary | ICD-10-CM

## 2014-01-01 MED ORDER — TELMISARTAN 80 MG PO TABS: 40.0000 mg | ORAL_TABLET | Freq: Two times a day (BID) | ORAL | Status: DC

## 2014-01-01 NOTE — Progress Notes (Signed)
Pre visit review using our clinic review tool, if applicable. No additional management support is needed unless otherwise documented below in the visit note. 

## 2014-01-01 NOTE — Progress Notes (Signed)
Patient ID: JLON BETKER, male   DOB: [birth year], 73 y.o.   MRN: [id number] FLORENTINO LAABS [id number] 21-Mar-1941 01/01/2014      Progress Note-Follow Up  Subjective  Chief Complaint  Chief Complaint  Patient presents with  . Follow-up  . Injections    flu    HPI  Patient is a 73 year old male in today for routine medical care. Doing fairly well. No recent illness. Did stop his Micardis for a short time albeit would help his reflux but has since restarted. No significant reflux at this time. No recent illness. Denies CP/palp/SOB/HA/congestion/fevers/GI or GU c/o. Taking meds as prescribed  Past Medical History  Diagnosis Date  . Arthritis   . GERD (gastroesophageal reflux disease)   . Hypertension     Past Surgical History  Procedure Laterality Date  . Cholecystectomy    . Hip surgery    . Total hip arthroplasty    . Joint replacement      Family History  Problem Relation Age of Onset  . Heart attack Father 27    deceased  . Hypertension Neg Hx   . Breast cancer Neg Hx   . Colon cancer Neg Hx   . Diabetes Maternal Uncle     maternal grandfather  . Prostate cancer Neg Hx     History   Social History  . Marital Status: Married    Spouse Name: N/A    Number of Children: N/A  . Years of Education: N/A   Occupational History  . Not on file.   Social History Main Topics  . Smoking status: Never Smoker   . Smokeless tobacco: Never Used  . Alcohol Use: Yes     Comment: occ  . Drug Use: No  . Sexual Activity: Yes   Other Topics Concern  . Not on file   Social History Narrative  . No narrative on file    Current Outpatient Prescriptions on File Prior to Visit  Medication Sig Dispense Refill  . aspirin 81 MG tablet Take 81 mg by mouth every other day.       [name] 300 MG CAPS Take 1 capsule by mouth daily.      Marland Kitchen telmisartan (MICARDIS) 80 MG tablet TAKE 1 TABLET BY MOUTH DAILY  30 tablet  3  . VIAGRA 50 MG tablet TAKE 1 TABLET BY MOUTH DAILY AS NEEDED   10 tablet  4  . Zinc 50 MG TABS Take 1 tablet by mouth daily.       No current facility-administered medications on file prior to visit.    No Known Allergies  Review of Systems  Review of Systems  Constitutional: Negative for fever and malaise/fatigue.  HENT: Negative for congestion.   Eyes: Negative for discharge.  Respiratory: Negative for shortness of breath.   Cardiovascular: Negative for chest pain, palpitations and leg swelling.  Gastrointestinal: Negative for nausea, abdominal pain and diarrhea.  Genitourinary: Negative for dysuria.  Musculoskeletal: Negative for falls.  Skin: Negative for rash.  Neurological: Negative for loss of consciousness and headaches.  Endo/Heme/Allergies: Negative for polydipsia.  Psychiatric/Behavioral: Negative for depression and suicidal ideas. The patient is not nervous/anxious and does not have insomnia.     Objective  BP 112/60  Pulse 90  Temp(Src) 98.1 F (36.7 C) (Oral)  Ht 5\' 10"  (1.778 m)  Wt 163 lb 3.2 oz (74.027 kg)  BMI 23.42 kg/m2  SpO2 97%  Physical Exam  Physical Exam  Constitutional: He is  oriented to person, place, and time and well-developed, well-nourished, and in no distress. No distress.  HENT:  Head: Normocephalic and atraumatic.  Eyes: Conjunctivae are normal.  Neck: Neck supple. No thyromegaly present.  Cardiovascular: Normal rate, regular rhythm and normal heart sounds.   No murmur heard. Pulmonary/Chest: Effort normal and breath sounds normal. No respiratory distress.  Abdominal: He exhibits no distension and no mass. There is no tenderness.  Musculoskeletal: He exhibits no edema.  Neurological: He is alert and oriented to person, place, and time.  Skin: Skin is warm.  Psychiatric: Memory, affect and judgment normal.    Lab Results  Component Value Date   TSH 1.112 05/19/2013   Lab Results  Component Value Date   WBC 4.9 05/19/2013   HGB 14.2 05/19/2013   HCT 41.6 05/19/2013   MCV 89.8 05/19/2013    PLT 159 05/19/2013   Lab Results  Component Value Date   CREATININE 1.16 05/19/2013   BUN 22 05/19/2013   NA 139 05/19/2013   K 4.4 05/19/2013   CL 105 05/19/2013   CO2 28 05/19/2013   Lab Results  Component Value Date   ALT 16 05/19/2013   AST 11 05/19/2013   ALKPHOS 61 05/19/2013   BILITOT 0.6 05/19/2013   Lab Results  Component Value Date   CHOL 172 05/19/2013   Lab Results  Component Value Date   HDL 47 05/19/2013   Lab Results  Component Value Date   LDLCALC 98 05/19/2013   Lab Results  Component Value Date   TRIG 135 05/19/2013   Lab Results  Component Value Date   CHOLHDL 3.7 05/19/2013     Assessment & Plan   HYPERTENSION Well controlled, no changes to meds. Encouraged heart healthy diet such as the DASH diet and exercise as tolerated.   UNSPECIFIED ANEMIA Increase leafy greens, consider increased lean red meat and using cast iron cookware. Continue to monitor, report any concerns. resolved  GERD Avoid offending foods, start probiotics. Do not eat large meals in late evening and consider raising head of bed.   Hyperglycemia hgba1c acceptable, minimize simple carbs. Increase exercise as tolerated. Continue current meds. A1C trending up so encouraged to increase exercise and avoid simple carbs

## 2014-01-01 NOTE — Patient Instructions (Signed)

## 2014-01-02 LAB — HEMOGLOBIN A1C: Hgb A1c MFr Bld: 6.1 % (ref 4.6–6.5)

## 2014-01-03 NOTE — Assessment & Plan Note (Signed)
Avoid offending foods, start probiotics. Do not eat large meals in late evening and consider raising head of bed.  

## 2014-01-03 NOTE — Assessment & Plan Note (Signed)
Increase leafy greens, consider increased lean red meat and using cast iron cookware. Continue to monitor, report any concerns. resolved 

## 2014-01-03 NOTE — Assessment & Plan Note (Signed)
Well controlled, no changes to meds. Encouraged heart healthy diet such as the DASH diet and exercise as tolerated.  °

## 2014-01-03 NOTE — Assessment & Plan Note (Signed)
hgba1c acceptable, minimize simple carbs. Increase exercise as tolerated. Continue current meds. A1C trending up so encouraged to increase exercise and avoid simple carbs

## 2014-01-11 DIAGNOSIS — IMO0002 Reserved for concepts with insufficient information to code with codable children: Secondary | ICD-10-CM | POA: Diagnosis not present

## 2014-02-06 DIAGNOSIS — S90852D Superficial foreign body, left foot, subsequent encounter: Secondary | ICD-10-CM | POA: Diagnosis not present

## 2014-02-07 ENCOUNTER — Encounter: Payer: Self-pay | Admitting: Family Medicine

## 2014-02-28 ENCOUNTER — Other Ambulatory Visit: Payer: Self-pay | Admitting: Family Medicine

## 2014-04-05 DIAGNOSIS — S90859A Superficial foreign body, unspecified foot, initial encounter: Secondary | ICD-10-CM | POA: Diagnosis not present

## 2014-04-06 DIAGNOSIS — L820 Inflamed seborrheic keratosis: Secondary | ICD-10-CM | POA: Diagnosis not present

## 2014-04-06 DIAGNOSIS — L821 Other seborrheic keratosis: Secondary | ICD-10-CM | POA: Diagnosis not present

## 2014-07-02 ENCOUNTER — Encounter: Payer: Medicare Other | Admitting: Family Medicine

## 2014-07-24 ENCOUNTER — Other Ambulatory Visit: Payer: Self-pay | Admitting: Family Medicine

## 2014-07-24 NOTE — Telephone Encounter (Signed)
Patient due for office visit- wellness visit scheduled for 08/13/14.  Rx refilled to last until that time.

## 2014-07-25 ENCOUNTER — Telehealth: Payer: Self-pay | Admitting: Family Medicine

## 2014-07-25 NOTE — Telephone Encounter (Signed)
pre visit letter sent °

## 2014-08-10 ENCOUNTER — Encounter: Payer: Self-pay | Admitting: *Deleted

## 2014-08-10 ENCOUNTER — Telehealth: Payer: Self-pay | Admitting: *Deleted

## 2014-08-10 NOTE — Telephone Encounter (Signed)
Pre-Visit Call completed with patient and chart updated.   Pre-Visit Info documented in Specialty Comments under [employer].    

## 2014-08-13 ENCOUNTER — Encounter: Payer: Self-pay | Admitting: Family Medicine

## 2014-08-13 ENCOUNTER — Ambulatory Visit (INDEPENDENT_AMBULATORY_CARE_PROVIDER_SITE_OTHER): Payer: Medicare Other | Admitting: Family Medicine

## 2014-08-13 VITALS — BP 114/64 | HR 81 | Temp 98.0°F | Ht 71.0 in | Wt 161.1 lb

## 2014-08-13 DIAGNOSIS — E785 Hyperlipidemia, unspecified: Secondary | ICD-10-CM | POA: Diagnosis not present

## 2014-08-13 DIAGNOSIS — R739 Hyperglycemia, unspecified: Secondary | ICD-10-CM | POA: Diagnosis not present

## 2014-08-13 DIAGNOSIS — I10 Essential (primary) hypertension: Secondary | ICD-10-CM | POA: Diagnosis not present

## 2014-08-13 DIAGNOSIS — M1990 Unspecified osteoarthritis, unspecified site: Secondary | ICD-10-CM | POA: Diagnosis not present

## 2014-08-13 DIAGNOSIS — D649 Anemia, unspecified: Secondary | ICD-10-CM

## 2014-08-13 DIAGNOSIS — Z0000 Encounter for general adult medical examination without abnormal findings: Secondary | ICD-10-CM | POA: Diagnosis not present

## 2014-08-13 DIAGNOSIS — F528 Other sexual dysfunction not due to a substance or known physiological condition: Secondary | ICD-10-CM

## 2014-08-13 DIAGNOSIS — R350 Frequency of micturition: Secondary | ICD-10-CM

## 2014-08-13 DIAGNOSIS — Z125 Encounter for screening for malignant neoplasm of prostate: Secondary | ICD-10-CM | POA: Diagnosis not present

## 2014-08-13 DIAGNOSIS — E782 Mixed hyperlipidemia: Secondary | ICD-10-CM

## 2014-08-13 DIAGNOSIS — K219 Gastro-esophageal reflux disease without esophagitis: Secondary | ICD-10-CM

## 2014-08-13 MED ORDER — SILDENAFIL CITRATE 50 MG PO TABS: 50.0000 mg | ORAL_TABLET | Freq: Every day | ORAL | Status: DC | PRN

## 2014-08-13 MED ORDER — TELMISARTAN 80 MG PO TABS
40.0000 mg | ORAL_TABLET | Freq: Two times a day (BID) | ORAL | Status: DC
Start: 2014-08-13 — End: 2015-04-23

## 2014-08-13 NOTE — Patient Instructions (Signed)
Salon Pas gel or patches as needed for pain  Preventive Care for Adults A healthy lifestyle and preventive care can promote health and wellness. Preventive health guidelines for men include the following key practices:  A routine yearly physical is a good way to check with your health care provider about your health and preventative screening. It is a chance to share any concerns and updates on your health and to receive a thorough exam.  Visit your dentist for a routine exam and preventative care every 6 months. Brush your teeth twice a day and floss once a day. Good oral hygiene prevents tooth decay and gum disease.  The frequency of eye exams is based on your age, health, family medical history, use of contact lenses, and other factors. Follow your health care provider's recommendations for frequency of eye exams.  Eat a healthy diet. Foods such as vegetables, fruits, whole grains, low-fat dairy products, and lean protein foods contain the nutrients you need without too many calories. Decrease your intake of foods high in solid fats, added sugars, and salt. Eat the right amount of calories for you.Get information about a proper diet from your health care provider, if necessary.  Regular physical exercise is one of the most important things you can do for your health. Most adults should get at least 150 minutes of moderate-intensity exercise (any activity that increases your heart rate and causes you to sweat) each week. In addition, most adults need muscle-strengthening exercises on 2 or more days a week.  Maintain a healthy weight. The body mass index (BMI) is a screening tool to identify possible weight problems. It provides an estimate of body fat based on height and weight. Your health care provider can find your BMI and can help you achieve or maintain a healthy weight.For adults 20 years and older:  A BMI below 18.5 is considered underweight.  A BMI of 18.5 to 24.9 is normal.  A BMI  of 25 to 29.9 is considered overweight.  A BMI of 30 and above is considered obese.  Maintain normal blood lipids and cholesterol levels by exercising and minimizing your intake of saturated fat. Eat a balanced diet with plenty of fruit and vegetables. Blood tests for lipids and cholesterol should begin at age 109 and be repeated every 5 years. If your lipid or cholesterol levels are high, you are over 50, or you are at high risk for heart disease, you may need your cholesterol levels checked more frequently.Ongoing high lipid and cholesterol levels should be treated with medicines if diet and exercise are not working.  If you smoke, find out from your health care provider how to quit. If you do not use tobacco, do not start.  Lung cancer screening is recommended for adults aged 9-80 years who are at high risk for developing lung cancer because of a history of smoking. A yearly low-dose CT scan of the lungs is recommended for people who have at least a 30-pack-year history of smoking and are a current smoker or have quit within the past 15 years. A pack year of smoking is smoking an average of 1 pack of cigarettes a day for 1 year (for example: 1 pack a day for 30 years or 2 packs a day for 15 years). Yearly screening should continue until the smoker has stopped smoking for at least 15 years. Yearly screening should be stopped for people who develop a health problem that would prevent them from having lung cancer treatment.  If  you choose to drink alcohol, do not have more than 2 drinks per day. One drink is considered to be 12 ounces (355 mL) of beer, 5 ounces (148 mL) of wine, or 1.5 ounces (44 mL) of liquor.  Avoid use of street drugs. Do not share needles with anyone. Ask for help if you need support or instructions about stopping the use of drugs.  High blood pressure causes heart disease and increases the risk of stroke. Your blood pressure should be checked at least every 1-2 years. Ongoing  high blood pressure should be treated with medicines, if weight loss and exercise are not effective.  If you are 96-46 years old, ask your health care provider if you should take aspirin to prevent heart disease.  Diabetes screening involves taking a blood sample to check your fasting blood sugar level. This should be done once every 3 years, after age 27, if you are within normal weight and without risk factors for diabetes. Testing should be considered at a younger age or be carried out more frequently if you are overweight and have at least 1 risk factor for diabetes.  Colorectal cancer can be detected and often prevented. Most routine colorectal cancer screening begins at the age of 20 and continues through age 53. However, your health care provider may recommend screening at an earlier age if you have risk factors for colon cancer. On a yearly basis, your health care provider may provide home test kits to check for hidden blood in the stool. Use of a small camera at the end of a tube to directly examine the colon (sigmoidoscopy or colonoscopy) can detect the earliest forms of colorectal cancer. Talk to your health care provider about this at age 5, when routine screening begins. Direct exam of the colon should be repeated every 5-10 years through age 64, unless early forms of precancerous polyps or small growths are found.  People who are at an increased risk for hepatitis B should be screened for this virus. You are considered at high risk for hepatitis B if:  You were born in a country where hepatitis B occurs often. Talk with your health care provider about which countries are considered high risk.  Your parents were born in a high-risk country and you have not received a shot to protect against hepatitis B (hepatitis B vaccine).  You have HIV or AIDS.  You use needles to inject street drugs.  You live with, or have sex with, someone who has hepatitis B.  You are a man who has sex with  other men (MSM).  You get hemodialysis treatment.  You take certain medicines for conditions such as cancer, organ transplantation, and autoimmune conditions.  Hepatitis C blood testing is recommended for all people born from [birth year] through 1965 and any individual with known risks for hepatitis C.  Practice safe sex. Use condoms and avoid high-risk sexual practices to reduce the spread of sexually transmitted infections (STIs). STIs include gonorrhea, chlamydia, syphilis, trichomonas, herpes, HPV, and human immunodeficiency virus (HIV). Herpes, HIV, and HPV are viral illnesses that have no cure. They can result in disability, cancer, and death.  If you are at risk of being infected with HIV, it is recommended that you take a prescription medicine daily to prevent HIV infection. This is called preexposure prophylaxis (PrEP). You are considered at risk if:  You are a man who has sex with other men (MSM) and have other risk factors.  You are a heterosexual man, are  sexually active, and are at increased risk for HIV infection.  You take drugs by injection.  You are sexually active with a partner who has HIV.  Talk with your health care provider about whether you are at high risk of being infected with HIV. If you choose to begin PrEP, you should first be tested for HIV. You should then be tested every 3 months for as long as you are taking PrEP.  A one-time screening for abdominal aortic aneurysm (AAA) and surgical repair of large AAAs by ultrasound are recommended for men ages 31 to 89 years who are current or former smokers.  Healthy men should no longer receive prostate-specific antigen (PSA) blood tests as part of routine cancer screening. Talk with your health care provider about prostate cancer screening.  Testicular cancer screening is not recommended for adult males who have no symptoms. Screening includes self-exam, a health care provider exam, and other screening tests. Consult with  your health care provider about any symptoms you have or any concerns you have about testicular cancer.  Use sunscreen. Apply sunscreen liberally and repeatedly throughout the day. You should seek shade when your shadow is shorter than you. Protect yourself by wearing long sleeves, pants, a wide-brimmed hat, and sunglasses year round, whenever you are outdoors.  Once a month, do a whole-body skin exam, using a mirror to look at the skin on your back. Tell your health care provider about new moles, moles that have irregular borders, moles that are larger than a pencil eraser, or moles that have changed in shape or color.  Stay current with required vaccines (immunizations).  Influenza vaccine. All adults should be immunized every year.  Tetanus, diphtheria, and acellular pertussis (Td, Tdap) vaccine. An adult who has not previously received Tdap or who does not know his vaccine status should receive 1 dose of Tdap. This initial dose should be followed by tetanus and diphtheria toxoids (Td) booster doses every 10 years. Adults with an unknown or incomplete history of completing a 3-dose immunization series with Td-containing vaccines should begin or complete a primary immunization series including a Tdap dose. Adults should receive a Td booster every 10 years.  Varicella vaccine. An adult without evidence of immunity to varicella should receive 2 doses or a second dose if he has previously received 1 dose.  Human papillomavirus (HPV) vaccine. Males aged 77-21 years who have not received the vaccine previously should receive the 3-dose series. Males aged 22-26 years may be immunized. Immunization is recommended through the age of 36 years for any male who has sex with males and did not get any or all doses earlier. Immunization is recommended for any person with an immunocompromised condition through the age of 58 years if he did not get any or all doses earlier. During the 3-dose series, the second dose  should be obtained 4-8 weeks after the first dose. The third dose should be obtained 24 weeks after the first dose and 16 weeks after the second dose.  Zoster vaccine. One dose is recommended for adults aged 46 years or older unless certain conditions are present.  Measles, mumps, and rubella (MMR) vaccine. Adults born before [birth year] generally are considered immune to measles and mumps. Adults born in [birth year] or later should have 1 or more doses of MMR vaccine unless there is a contraindication to the vaccine or there is laboratory evidence of immunity to each of the three diseases. A routine second dose of MMR vaccine should be obtained at  least 28 days after the first dose for students attending postsecondary schools, health care workers, or international travelers. People who received inactivated measles vaccine or an unknown type of measles vaccine during 1963-1967 should receive 2 doses of MMR vaccine. People who received inactivated mumps vaccine or an unknown type of mumps vaccine before 1979 and are at high risk for mumps infection should consider immunization with 2 doses of MMR vaccine. Unvaccinated health care workers born before [birth year] who lack laboratory evidence of measles, mumps, or rubella immunity or laboratory confirmation of disease should consider measles and mumps immunization with 2 doses of MMR vaccine or rubella immunization with 1 dose of MMR vaccine.  Pneumococcal 13-valent conjugate (PCV13) vaccine. When indicated, a person who is uncertain of his immunization history and has no record of immunization should receive the PCV13 vaccine. An adult aged 47 years or older who has certain medical conditions and has not been previously immunized should receive 1 dose of PCV13 vaccine. This PCV13 should be followed with a dose of pneumococcal polysaccharide (PPSV23) vaccine. The PPSV23 vaccine dose should be obtained at least 8 weeks after the dose of PCV13 vaccine. An adult aged 15 years or older  who has certain medical conditions and previously received 1 or more doses of PPSV23 vaccine should receive 1 dose of PCV13. The PCV13 vaccine dose should be obtained 1 or more years after the last PPSV23 vaccine dose.  Pneumococcal polysaccharide (PPSV23) vaccine. When PCV13 is also indicated, PCV13 should be obtained first. All adults aged 78 years and older should be immunized. An adult younger than age 63 years who has certain medical conditions should be immunized. Any person who resides in a nursing home or long-term care facility should be immunized. An adult smoker should be immunized. People with an immunocompromised condition and certain other conditions should receive both PCV13 and PPSV23 vaccines. People with human immunodeficiency virus (HIV) infection should be immunized as soon as possible after diagnosis. Immunization during chemotherapy or radiation therapy should be avoided. Routine use of PPSV23 vaccine is not recommended for American Indians, [place] Natives, or people younger than 65 years unless there are medical conditions that require PPSV23 vaccine. When indicated, people who have unknown immunization and have no record of immunization should receive PPSV23 vaccine. One-time revaccination 5 years after the first dose of PPSV23 is recommended for people aged 19-64 years who have chronic kidney failure, nephrotic syndrome, asplenia, or immunocompromised conditions. People who received 1-2 doses of PPSV23 before age 24 years should receive another dose of PPSV23 vaccine at age 61 years or later if at least 5 years have passed since the previous dose. Doses of PPSV23 are not needed for people immunized with PPSV23 at or after age 22 years.  Meningococcal vaccine. Adults with asplenia or persistent complement component deficiencies should receive 2 doses of quadrivalent meningococcal conjugate (MenACWY-D) vaccine. The doses should be obtained at least 2 months apart. Microbiologists working  with certain meningococcal bacteria, [employer] recruits, people at risk during an outbreak, and people who travel to or live in countries with a high rate of meningitis should be immunized. A first-year college student up through age 20 years who is living in a residence hall should receive a dose if he did not receive a dose on or after his 16th birthday. Adults who have certain high-risk conditions should receive one or more doses of vaccine.  Hepatitis A vaccine. Adults who wish to be protected from this disease, have certain high-risk conditions, work  with hepatitis A-infected animals, work in hepatitis A research labs, or travel to or work in countries with a high rate of hepatitis A should be immunized. Adults who were previously unvaccinated and who anticipate close contact with an international adoptee during the first 60 days after arrival in the [place] from a country with a high rate of hepatitis A should be immunized.  Hepatitis B vaccine. Adults should be immunized if they wish to be protected from this disease, have certain high-risk conditions, may be exposed to blood or other infectious body fluids, are household contacts or sex partners of hepatitis B positive people, are clients or workers in certain care facilities, or travel to or work in countries with a high rate of hepatitis B.  Haemophilus influenzae type b (Hib) vaccine. A previously unvaccinated person with asplenia or sickle cell disease or having a scheduled splenectomy should receive 1 dose of Hib vaccine. Regardless of previous immunization, a recipient of a hematopoietic stem cell transplant should receive a 3-dose series 6-12 months after his successful transplant. Hib vaccine is not recommended for adults with HIV infection. Preventive Service / Frequency Ages 42 to 88  Blood pressure check.** / Every 1 to 2 years.  Lipid and cholesterol check.** / Every 5 years beginning at age 9.  Hepatitis C blood test.** / For  any individual with known risks for hepatitis C.  Skin self-exam. / Monthly.  Influenza vaccine. / Every year.  Tetanus, diphtheria, and acellular pertussis (Tdap, Td) vaccine.** / Consult your health care provider. 1 dose of Td every 10 years.  Varicella vaccine.** / Consult your health care provider.  HPV vaccine. / 3 doses over 6 months, if 42 or younger.  Measles, mumps, rubella (MMR) vaccine.** / You need at least 1 dose of MMR if you were born in 1957 or later. You may also need a second dose.  Pneumococcal 13-valent conjugate (PCV13) vaccine.** / Consult your health care provider.  Pneumococcal polysaccharide (PPSV23) vaccine.** / 1 to 2 doses if you smoke cigarettes or if you have certain conditions.  Meningococcal vaccine.** / 1 dose if you are age 14 to 70 years and a Market researcher living in a residence hall, or have one of several medical conditions. You may also need additional booster doses.  Hepatitis A vaccine.** / Consult your health care provider.  Hepatitis B vaccine.** / Consult your health care provider.  Haemophilus influenzae type b (Hib) vaccine.** / Consult your health care provider. Ages 59 to 42  Blood pressure check.** / Every 1 to 2 years.  Lipid and cholesterol check.** / Every 5 years beginning at age 7.  Lung cancer screening. / Every year if you are aged 67-80 years and have a 30-pack-year history of smoking and currently smoke or have quit within the past 15 years. Yearly screening is stopped once you have quit smoking for at least 15 years or develop a health problem that would prevent you from having lung cancer treatment.  Fecal occult blood test (FOBT) of stool. / Every year beginning at age 22 and continuing until age 51. You may not have to do this test if you get a colonoscopy every 10 years.  Flexible sigmoidoscopy** or colonoscopy.** / Every 5 years for a flexible sigmoidoscopy or every 10 years for a colonoscopy beginning at  age 41 and continuing until age 32.  Hepatitis C blood test.** / For all people born from [birth year] through 1965 and any individual with known risks for hepatitis  C.  Skin self-exam. / Monthly.  Influenza vaccine. / Every year.  Tetanus, diphtheria, and acellular pertussis (Tdap/Td) vaccine.** / Consult your health care provider. 1 dose of Td every 10 years.  Varicella vaccine.** / Consult your health care provider.  Zoster vaccine.** / 1 dose for adults aged 65 years or older.  Measles, mumps, rubella (MMR) vaccine.** / You need at least 1 dose of MMR if you were born in 1957 or later. You may also need a second dose.  Pneumococcal 13-valent conjugate (PCV13) vaccine.** / Consult your health care provider.  Pneumococcal polysaccharide (PPSV23) vaccine.** / 1 to 2 doses if you smoke cigarettes or if you have certain conditions.  Meningococcal vaccine.** / Consult your health care provider.  Hepatitis A vaccine.** / Consult your health care provider.  Hepatitis B vaccine.** / Consult your health care provider.  Haemophilus influenzae type b (Hib) vaccine.** / Consult your health care provider. Ages 79 and over  Blood pressure check.** / Every 1 to 2 years.  Lipid and cholesterol check.**/ Every 5 years beginning at age 61.  Lung cancer screening. / Every year if you are aged 48-80 years and have a 30-pack-year history of smoking and currently smoke or have quit within the past 15 years. Yearly screening is stopped once you have quit smoking for at least 15 years or develop a health problem that would prevent you from having lung cancer treatment.  Fecal occult blood test (FOBT) of stool. / Every year beginning at age 84 and continuing until age 45. You may not have to do this test if you get a colonoscopy every 10 years.  Flexible sigmoidoscopy** or colonoscopy.** / Every 5 years for a flexible sigmoidoscopy or every 10 years for a colonoscopy beginning at age 14 and continuing until  age 58.  Hepatitis C blood test.** / For all people born from [birth year] through 1963 and any individual with known risks for hepatitis C.  Abdominal aortic aneurysm (AAA) screening.** / A one-time screening for ages 23 to 1 years who are current or former smokers.  Skin self-exam. / Monthly.  Influenza vaccine. / Every year.  Tetanus, diphtheria, and acellular pertussis (Tdap/Td) vaccine.** / 1 dose of Td every 10 years.  Varicella vaccine.** / Consult your health care provider.  Zoster vaccine.** / 1 dose for adults aged 76 years or older.  Pneumococcal 13-valent conjugate (PCV13) vaccine.** / Consult your health care provider.  Pneumococcal polysaccharide (PPSV23) vaccine.** / 1 dose for all adults aged 60 years and older.  Meningococcal vaccine.** / Consult your health care provider.  Hepatitis A vaccine.** / Consult your health care provider.  Hepatitis B vaccine.** / Consult your health care provider.  Haemophilus influenzae type b (Hib) vaccine.** / Consult your health care provider. **Family history and personal history of risk and conditions may change your health care provider's recommendations. Document Released: 06/16/2001 Document Revised: 04/25/2013 Document Reviewed: 09/15/2010 [facility] Patient Information 2015 [place], [place]. This information is not intended to replace advice given to you by your health care provider. Make sure you discuss any questions you have with your health care provider. as gel or a patch to affected area

## 2014-08-14 LAB — COMPREHENSIVE METABOLIC PANEL
ALK PHOS: 73 U/L (ref 39–117)
ALT: 17 U/L (ref 0–53)
AST: 17 U/L (ref 0–37)
Albumin: 4.1 g/dL (ref 3.5–5.2)
BUN: 18 mg/dL (ref 6–23)
CALCIUM: 9.3 mg/dL (ref 8.4–10.5)
CHLORIDE: 106 meq/L (ref 96–112)
CO2: 29 mEq/L (ref 19–32)
Creatinine, Ser: 1.09 mg/dL (ref 0.40–1.50)
GFR: 70.8 mL/min (ref >60.00)
Glucose, Bld: 87 mg/dL (ref 70–99)
POTASSIUM: 4.4 meq/L (ref 3.5–5.1)
Sodium: 140 mEq/L (ref 135–145)
Total Bilirubin: 0.6 mg/dL (ref 0.2–1.2)
Total Protein: 7.1 g/dL (ref 6.0–8.3)

## 2014-08-14 LAB — CBC
HCT: 41.7 % (ref 39.0–52.0)
Hemoglobin: 14.1 g/dL (ref 13.0–17.0)
MCHC: 34 g/dL (ref 30.0–36.0)
MCV: 91.9 fl (ref 78.0–100.0)
PLATELETS: 149 10*3/uL — AB (ref 150.0–400.0)
RBC: 4.53 Mil/uL (ref 4.22–5.81)
RDW: 13.1 % (ref 11.5–15.5)
WBC: 5.8 10*3/uL (ref 4.0–10.5)

## 2014-08-14 LAB — TSH: TSH: 1.47 u[IU]/mL (ref 0.35–4.50)

## 2014-08-14 LAB — LIPID PANEL
CHOLESTEROL: 179 mg/dL (ref 0–200)
HDL: 49 mg/dL (ref >39.00)
LDL Cholesterol: 97 mg/dL (ref 0–99)
NonHDL: 130
TRIGLYCERIDES: 166 mg/dL — AB (ref 0.0–149.0)
Total CHOL/HDL Ratio: 4
VLDL: 33.2 mg/dL (ref 0.0–40.0)

## 2014-08-14 LAB — PSA, MEDICARE: PSA: 1.23 ng/ml (ref 0.10–4.00)

## 2014-08-14 LAB — HEMOGLOBIN A1C: Hgb A1c MFr Bld: 5.9 % (ref 4.6–6.5)

## 2014-08-19 ENCOUNTER — Encounter: Payer: Self-pay | Admitting: Family Medicine

## 2014-08-19 DIAGNOSIS — Z0000 Encounter for general adult medical examination without abnormal findings: Secondary | ICD-10-CM | POA: Insufficient documentation

## 2014-08-19 HISTORY — DX: Encounter for general adult medical examination without abnormal findings: Z00.00

## 2014-08-19 NOTE — Assessment & Plan Note (Signed)
Resolved on recent lab work

## 2014-08-19 NOTE — Assessment & Plan Note (Signed)
Viagra prn 

## 2014-08-19 NOTE — Assessment & Plan Note (Signed)
Well controlled, no changes to meds. Encouraged heart healthy diet such as the DASH diet and exercise as tolerated.  °

## 2014-08-19 NOTE — Assessment & Plan Note (Addendum)
hgba1c acceptable, minimize simple carbs. Increase exercise as tolerated.  

## 2014-08-19 NOTE — Progress Notes (Signed)
BASHIR MARCHETTI  [id number] 07/04/1940 08/19/2014      Progress Note-Follow Up  Subjective  Chief Complaint  Chief Complaint  Patient presents with  . Annual Exam    HPI  Patient is a 74 y.o. male in today for routine medical care. Patient is in today for routine annual exam. Overall in a good state of health. No recent acute illness. No recent trips to the hospital. Continues to struggle with arthritic pain and is having most notably trouble in his right shoulder and left hip. No recent injury or trauma. Pain is manageable and does not disrupt his daily life. Is performing his ADLs at home without difficulty. Denies CP/palp/SOB/HA/congestion/fevers/GI or GU c/o. Taking meds as prescribed  Past Medical History  Diagnosis Date  . Arthritis   . GERD (gastroesophageal reflux disease)   . Hypertension   . Osteoarthritis 09/21/2007    Qualifier: Diagnosis of  By: Niel Hummer MD, [facility] annual wellness visit, subsequent 08/19/2014    Past Surgical History  Procedure Laterality Date  . Cholecystectomy    . Hip surgery    . Total hip arthroplasty    . Joint replacement      Family History  Problem Relation Age of Onset  . Heart attack Father 6    deceased  . Hypertension Neg Hx   . Breast cancer Neg Hx   . Colon cancer Neg Hx   . Diabetes Maternal Uncle     maternal grandfather  . Prostate cancer Neg Hx     History   Social History  . Marital Status: Married    Spouse Name: N/A  . Number of Children: N/A  . Years of Education: N/A   Occupational History  . Not on file.   Social History Main Topics  . Smoking status: Never Smoker   . Smokeless tobacco: Never Used  . Alcohol Use: Yes     Comment: occ  . Drug Use: No  . Sexual Activity: Yes   Other Topics Concern  . Not on file   Social History Narrative    Current Outpatient Prescriptions on File Prior to Visit  Medication Sig Dispense Refill  . aspirin 81 MG tablet Take 81 mg by mouth  every other day.     [name] 300 MG CAPS Take 1 capsule by mouth daily.     No current facility-administered medications on file prior to visit.    No Known Allergies    Medication: Reviewed with patient and updated  Preferred Pharmacy: [id number] - [place], [place] - [address] AT [address]  Allergies verified: NKA   Immunization Status: Tdap--09/28/07 PNA-- 05/24/13 (13), 02/07/08 (23)  Shingles-- 05/02/09  A/P:  Changes to Ray, [place] or Personal Hx: UTD  CCS: 04/15/09- normal per patient  PSA: 09/16/10- 0.91  Care Teams Updated: no other providers   ED/Hospital/Urgent Care Visits: none   Review of Systems  Review of Systems  Constitutional: Negative for fever, chills and malaise/fatigue.  HENT: Negative for congestion, hearing loss and nosebleeds.   Eyes: Negative for discharge.  Respiratory: Negative for cough, sputum production, shortness of breath and wheezing.   Cardiovascular: Negative for chest pain, palpitations and leg swelling.  Gastrointestinal: Negative for heartburn, nausea, vomiting, abdominal pain, diarrhea, constipation and blood in stool.  Genitourinary: Negative for dysuria, urgency, frequency and hematuria.  Musculoskeletal: Positive for joint pain. Negative for myalgias, back pain  and falls.  Skin: Negative for rash.  Neurological: Negative for dizziness, tremors, sensory change, focal weakness, loss of consciousness, weakness and headaches.  Endo/Heme/Allergies: Negative for polydipsia. Does not bruise/bleed easily.  Psychiatric/Behavioral: Negative for depression and suicidal ideas. The patient is not nervous/anxious and does not have insomnia.     Objective  BP 114/64 mmHg  Pulse 81  Temp(Src) 98 F (36.7 C) (Oral)  Ht 5\' 11"  (1.803 m)  Wt 161 lb 2 oz (73.086 kg)  BMI 22.48 kg/m2  SpO2 95%  Physical Exam  Physical Exam  Constitutional: He is oriented to person, place, and time and  well-developed, well-nourished, and in no distress. No distress.  HENT:  Head: Normocephalic and atraumatic.  Eyes: Conjunctivae are normal.  Neck: Neck supple. No thyromegaly present.  Cardiovascular: Normal rate, regular rhythm and normal heart sounds.   No murmur heard. Pulmonary/Chest: Effort normal and breath sounds normal. No respiratory distress.  Abdominal: Soft. Bowel sounds are normal. He exhibits no distension and no mass. There is no tenderness. There is no rebound and no guarding.  Musculoskeletal: He exhibits no edema.  Neurological: He is alert and oriented to person, place, and time.  Skin: Skin is warm.  Psychiatric: Memory, affect and judgment normal.    Lab Results  Component Value Date   TSH 1.47 08/13/2014   Lab Results  Component Value Date   WBC 5.8 08/13/2014   HGB 14.1 08/13/2014   HCT 41.7 08/13/2014   MCV 91.9 08/13/2014   PLT 149.0* 08/13/2014   Lab Results  Component Value Date   CREATININE 1.09 08/13/2014   BUN 18 08/13/2014   NA 140 08/13/2014   K 4.4 08/13/2014   CL 106 08/13/2014   CO2 29 08/13/2014   Lab Results  Component Value Date   ALT 17 08/13/2014   AST 17 08/13/2014   ALKPHOS 73 08/13/2014   BILITOT 0.6 08/13/2014   Lab Results  Component Value Date   CHOL 179 08/13/2014   Lab Results  Component Value Date   HDL 49.00 08/13/2014   Lab Results  Component Value Date   LDLCALC 97 08/13/2014   Lab Results  Component Value Date   TRIG 166.0* 08/13/2014   Lab Results  Component Value Date   CHOLHDL 4 08/13/2014     Assessment & Plan  Essential hypertension Well controlled, no changes to meds. Encouraged heart healthy diet such as the DASH diet and exercise as tolerated.    GERD Avoid offending foods, start probiotics. Do not eat large meals in late evening and consider raising head of bed.    Hyperlipidemia, mixed Encouraged heart healthy diet, increase exercise, avoid trans fats, consider a krill oil cap  daily   Hyperglycemia hgba1c acceptable, minimize simple carbs. Increase exercise as tolerated.   Anemia Resolved on recent lab work   Osteoarthritis Worsening in right shoulder and left hip. Encouraged increased activity, topical treatments such as Salon Pas    ERECTILE DYSFUNCTION Viagra prn   Medicare annual wellness visit, subsequent Patient denies any difficulties at home. No trouble with ADLs, depression or falls. No recent changes to vision or hearing. Is UTD with immunizations. Is UTD with screening. Discussed Advanced Directives, patient agrees to bring [place] copies of documents if can. Encouraged heart healthy diet, exercise as tolerated and adequate sleep Gastroenterology, Dr Carlean Purl, colonoscopy in 2010 repeat in 10 years. Annual labs ordered and reviewed. See avs for preventative healthcare recommendations. See problem list for risk factors

## 2014-08-19 NOTE — Assessment & Plan Note (Signed)
Avoid offending foods, start probiotics. Do not eat large meals in late evening and consider raising head of bed.  

## 2014-08-19 NOTE — Assessment & Plan Note (Signed)
Encouraged heart healthy diet, increase exercise, avoid trans fats, consider a krill oil cap daily 

## 2014-08-19 NOTE — Assessment & Plan Note (Signed)
Worsening in right shoulder and left hip. Encouraged increased activity, topical treatments such as [employer]

## 2014-08-19 NOTE — Assessment & Plan Note (Signed)
Patient denies any difficulties at home. No trouble with ADLs, depression or falls. No recent changes to vision or hearing. Is UTD with immunizations. Is UTD with screening. Discussed Advanced Directives, patient agrees to bring [place] copies of documents if can. Encouraged heart healthy diet, exercise as tolerated and adequate sleep Gastroenterology, Dr Carlean Purl, colonoscopy in 2010 repeat in 10 years. Annual labs ordered and reviewed. See avs for preventative healthcare recommendations. See problem list for risk factors

## 2014-12-11 ENCOUNTER — Other Ambulatory Visit: Payer: Self-pay | Admitting: Family

## 2015-03-11 ENCOUNTER — Telehealth: Payer: Self-pay | Admitting: Family Medicine

## 2015-03-11 ENCOUNTER — Ambulatory Visit: Payer: Medicare Other | Admitting: Family Medicine

## 2015-03-11 NOTE — Telephone Encounter (Signed)
Pt left VM 03/11/15 1:59pm to sched checkup with Dr. Charlett Blake. Left msg for pt to call office.

## 2015-04-05 ENCOUNTER — Encounter: Payer: Self-pay | Admitting: Internal Medicine

## 2015-04-23 ENCOUNTER — Other Ambulatory Visit: Payer: Self-pay | Admitting: Family Medicine

## 2015-04-23 MED ORDER — TELMISARTAN 80 MG PO TABS: 40.0000 mg | ORAL_TABLET | Freq: Two times a day (BID) | ORAL | Status: DC

## 2015-05-21 ENCOUNTER — Encounter: Payer: Self-pay | Admitting: Family Medicine

## 2015-05-21 ENCOUNTER — Ambulatory Visit (INDEPENDENT_AMBULATORY_CARE_PROVIDER_SITE_OTHER): Payer: Medicare Other | Admitting: Family Medicine

## 2015-05-21 VITALS — BP 142/78 | HR 71 | Temp 98.1°F | Ht 71.0 in | Wt 162.0 lb

## 2015-05-21 DIAGNOSIS — E782 Mixed hyperlipidemia: Secondary | ICD-10-CM

## 2015-05-21 DIAGNOSIS — R739 Hyperglycemia, unspecified: Secondary | ICD-10-CM | POA: Diagnosis not present

## 2015-05-21 DIAGNOSIS — I10 Essential (primary) hypertension: Secondary | ICD-10-CM | POA: Diagnosis not present

## 2015-05-21 DIAGNOSIS — E559 Vitamin D deficiency, unspecified: Secondary | ICD-10-CM

## 2015-05-21 DIAGNOSIS — K219 Gastro-esophageal reflux disease without esophagitis: Secondary | ICD-10-CM

## 2015-05-21 LAB — LIPID PANEL
CHOL/HDL RATIO: 4
CHOLESTEROL: 195 mg/dL (ref 0–200)
HDL: 54.1 mg/dL (ref >39.00)
LDL Cholesterol: 124 mg/dL — ABNORMAL HIGH (ref 0–99)
NonHDL: 140.77
TRIGLYCERIDES: 86 mg/dL (ref 0.0–149.0)
VLDL: 17.2 mg/dL (ref 0.0–40.0)

## 2015-05-21 LAB — COMPREHENSIVE METABOLIC PANEL
ALBUMIN: 4.1 g/dL (ref 3.5–5.2)
ALK PHOS: 77 U/L (ref 39–117)
ALT: 15 U/L (ref 0–53)
AST: 14 U/L (ref 0–37)
BUN: 18 mg/dL (ref 6–23)
CALCIUM: 9.3 mg/dL (ref 8.4–10.5)
CHLORIDE: 105 meq/L (ref 96–112)
CO2: 29 mEq/L (ref 19–32)
CREATININE: 1.18 mg/dL (ref 0.40–1.50)
GFR: 64.47 mL/min (ref >60.00)
Glucose, Bld: 93 mg/dL (ref 70–99)
POTASSIUM: 4.4 meq/L (ref 3.5–5.1)
SODIUM: 141 meq/L (ref 135–145)
TOTAL PROTEIN: 7.1 g/dL (ref 6.0–8.3)
Total Bilirubin: 0.7 mg/dL (ref 0.2–1.2)

## 2015-05-21 LAB — CBC
HEMATOCRIT: 42.8 % (ref 39.0–52.0)
HEMOGLOBIN: 14.4 g/dL (ref 13.0–17.0)
MCHC: 33.6 g/dL (ref 30.0–36.0)
MCV: 92.6 fl (ref 78.0–100.0)
PLATELETS: 135 10*3/uL — AB (ref 150.0–400.0)
RBC: 4.62 Mil/uL (ref 4.22–5.81)
RDW: 12.8 % (ref 11.5–15.5)
WBC: 5.6 10*3/uL (ref 4.0–10.5)

## 2015-05-21 LAB — TSH: TSH: 1.15 u[IU]/mL (ref 0.35–4.50)

## 2015-05-21 LAB — VITAMIN D 25 HYDROXY (VIT D DEFICIENCY, FRACTURES): VITD: 23.44 ng/mL — AB (ref 30.00–100.00)

## 2015-05-21 LAB — HEMOGLOBIN A1C: HEMOGLOBIN A1C: 5.9 % (ref 4.6–6.5)

## 2015-05-21 NOTE — Progress Notes (Signed)
Pre visit review using our clinic review tool, if applicable. No additional management support is needed unless otherwise documented below in the visit note. 

## 2015-05-21 NOTE — Progress Notes (Signed)
Subjective:    Patient ID: Micheal Mcintyre, male    DOB: [birth year], 75 y.o.   MRN: [id number]  Chief Complaint  Patient presents with  . Follow-up    HPI Patient is in today for follow up. Is feeling well today, no recent illness. Has been using Ibuprofen for joint pain sparingly with good results. No recent hospitalization. Denies CP/palp/SOB/HA/congestion/fevers/GI or GU c/o. Taking meds as prescribed  Past Medical History  Diagnosis Date  . Arthritis   . GERD (gastroesophageal reflux disease)   . Hypertension   . Osteoarthritis 09/21/2007    Qualifier: Diagnosis of  By: Niel Hummer MD, [facility] annual wellness visit, subsequent 08/19/2014    Past Surgical History  Procedure Laterality Date  . Cholecystectomy    . Hip surgery    . Total hip arthroplasty    . Joint replacement      Family History  Problem Relation Age of Onset  . Heart attack Father 72    deceased  . Hypertension Neg Hx   . Breast cancer Neg Hx   . Colon cancer Neg Hx   . Diabetes Maternal Uncle     maternal grandfather  . Prostate cancer Neg Hx     Social History   Social History  . Marital Status: Married    Spouse Name: N/A  . Number of Children: N/A  . Years of Education: N/A   Occupational History  . Not on file.   Social History Main Topics  . Smoking status: Never Smoker   . Smokeless tobacco: Never Used  . Alcohol Use: Yes     Comment: occ  . Drug Use: No  . Sexual Activity: Yes   Other Topics Concern  . Not on file   Social History Narrative    Outpatient Prescriptions Prior to Visit  Medication Sig Dispense Refill  . aspirin 81 MG tablet Take 81 mg by mouth every other day.     Micheal Mcintyre 300 MG CAPS Take 1 capsule by mouth daily.    Marland Kitchen telmisartan (MICARDIS) 80 MG tablet Take 0.5 tablets (40 mg total) by mouth 2 (two) times daily. 30 tablet 6  . VIAGRA 50 MG tablet TAKE 1 TABLET BY MOUTH DAILY AS NEEDED 10 tablet 3   No facility-administered medications  prior to visit.    No Known Allergies  Review of Systems  Constitutional: Negative for fever and malaise/fatigue.  HENT: Negative for congestion.   Eyes: Negative for blurred vision and discharge.  Respiratory: Negative for shortness of breath.   Cardiovascular: Negative for chest pain, palpitations and leg swelling.  Gastrointestinal: Negative for nausea and abdominal pain.  Genitourinary: Negative for dysuria.  Musculoskeletal: Negative for falls.  Skin: Negative for rash.  Neurological: Negative for loss of consciousness and headaches.  Endo/Heme/Allergies: Negative for environmental allergies.  Psychiatric/Behavioral: Negative for depression. The patient is not nervous/anxious.        Objective:    Physical Exam  Constitutional: He is oriented to person, place, and time. He appears well-developed and well-nourished. No distress.  HENT:  Head: Normocephalic and atraumatic.  Nose: Nose normal.  Eyes: Right eye exhibits no discharge. Left eye exhibits no discharge.  Neck: Normal range of motion. Neck supple.  Cardiovascular: Normal rate and regular rhythm.   No murmur heard. Pulmonary/Chest: Effort normal and breath sounds normal.  Abdominal: Soft. Bowel sounds are normal. There is no tenderness.  Musculoskeletal: He exhibits no edema.  Neurological: He  is alert and oriented to person, place, and time.  Skin: Skin is warm and dry.  Psychiatric: He has a normal mood and affect.  Nursing note and vitals reviewed.   BP 148/78 mmHg  Pulse 71  Temp(Src) 98.1 F (36.7 C) (Oral)  Ht 5\' 11"  (1.803 m)  Wt 162 lb (73.483 kg)  BMI 22.60 kg/m2  SpO2 100% Wt Readings from Last 3 Encounters:  05/21/15 162 lb (73.483 kg)  08/13/14 161 lb 2 oz (73.086 kg)  01/01/14 163 lb 3.2 oz (74.027 kg)     Lab Results  Component Value Date   WBC 5.8 08/13/2014   HGB 14.1 08/13/2014   HCT 41.7 08/13/2014   PLT 149.0* 08/13/2014   GLUCOSE 87 08/13/2014   CHOL 179 08/13/2014   TRIG  166.0* 08/13/2014   HDL 49.00 08/13/2014   LDLCALC 97 08/13/2014   ALT 17 08/13/2014   AST 17 08/13/2014   NA 140 08/13/2014   K 4.4 08/13/2014   CL 106 08/13/2014   CREATININE 1.09 08/13/2014   BUN 18 08/13/2014   CO2 29 08/13/2014   TSH 1.47 08/13/2014   PSA 1.23 08/13/2014   HGBA1C 5.9 08/13/2014    Lab Results  Component Value Date   TSH 1.47 08/13/2014   Lab Results  Component Value Date   WBC 5.8 08/13/2014   HGB 14.1 08/13/2014   HCT 41.7 08/13/2014   MCV 91.9 08/13/2014   PLT 149.0* 08/13/2014   Lab Results  Component Value Date   NA 140 08/13/2014   K 4.4 08/13/2014   CO2 29 08/13/2014   GLUCOSE 87 08/13/2014   BUN 18 08/13/2014   CREATININE 1.09 08/13/2014   BILITOT 0.6 08/13/2014   ALKPHOS 73 08/13/2014   AST 17 08/13/2014   ALT 17 08/13/2014   PROT 7.1 08/13/2014   ALBUMIN 4.1 08/13/2014   CALCIUM 9.3 08/13/2014   GFR 70.80 08/13/2014   Lab Results  Component Value Date   CHOL 179 08/13/2014   Lab Results  Component Value Date   HDL 49.00 08/13/2014   Lab Results  Component Value Date   LDLCALC 97 08/13/2014   Lab Results  Component Value Date   TRIG 166.0* 08/13/2014   Lab Results  Component Value Date   CHOLHDL 4 08/13/2014   Lab Results  Component Value Date   HGBA1C 5.9 08/13/2014       Assessment & Plan:   Problem List Items Addressed This Visit    None      I am having Micheal Mcintyre maintain his aspirin, Krill Mcintyre, VIAGRA, and telmisartan.  No orders of the defined types were placed in this encounter.     Willette Alma, MD

## 2015-05-21 NOTE — Patient Instructions (Addendum)
Salonpas with lidocaine patch  Hypertension  Hypertension, commonly called high blood pressure, is when the force of blood pumping through your arteries is too strong. Your arteries are the blood vessels that carry blood from your heart throughout your body. A blood pressure reading consists of a higher number over a lower number, such as 110/72. The higher number (systolic) is the pressure inside your arteries when your heart pumps. The lower number (diastolic) is the pressure inside your arteries when your heart relaxes. Ideally you want your blood pressure below 120/80. Hypertension forces your heart to work harder to pump blood. Your arteries may become narrow or stiff. Having untreated or uncontrolled hypertension can cause heart attack, stroke, kidney disease, and other problems. RISK FACTORS Some risk factors for high blood pressure are controllable. Others are not.  Risk factors you cannot control include:   Race. You may be at higher risk if you are African American.  Age. Risk increases with age.  Gender. Men are at higher risk than women before age 95 years. After age 36, women are at higher risk than men. Risk factors you can control include:  Not getting enough exercise or physical activity.  Being overweight.  Getting too much fat, sugar, calories, or salt in your diet.  Drinking too much alcohol. SIGNS AND SYMPTOMS Hypertension does not usually cause signs or symptoms. Extremely high blood pressure (hypertensive crisis) may cause headache, anxiety, shortness of breath, and nosebleed. DIAGNOSIS To check if you have hypertension, your health care provider will measure your blood pressure while you are seated, with your arm held at the level of your heart. It should be measured at least twice using the same arm. Certain conditions can cause a difference in blood pressure between your right and left arms. A blood pressure reading that is higher than normal on one occasion does  not mean that you need treatment. If it is not clear whether you have high blood pressure, you may be asked to return on a different day to have your blood pressure checked again. Or, you may be asked to monitor your blood pressure at home for 1 or more weeks. TREATMENT Treating high blood pressure includes making lifestyle changes and possibly taking medicine. Living a healthy lifestyle can help lower high blood pressure. You may need to change some of your habits. Lifestyle changes may include:  Following the DASH diet. This diet is high in fruits, vegetables, and whole grains. It is low in salt, red meat, and added sugars.  Keep your sodium intake below 2,300 mg per day.  Getting at least 30-45 minutes of aerobic exercise at least 4 times per week.  Losing weight if necessary.  Not smoking.  Limiting alcoholic beverages.  Learning ways to reduce stress. Your health care provider may prescribe medicine if lifestyle changes are not enough to get your blood pressure under control, and if one of the following is true:  You are 67-19 years of age and your systolic blood pressure is above 140.  You are 30 years of age or older, and your systolic blood pressure is above 150.  Your diastolic blood pressure is above 90.  You have diabetes, and your systolic blood pressure is over [id number] or your diastolic blood pressure is over 90.  You have kidney disease and your blood pressure is above 140/90.  You have heart disease and your blood pressure is above 140/90. Your personal target blood pressure may vary depending on your medical conditions, your age,  and other factors. HOME CARE INSTRUCTIONS  Have your blood pressure rechecked as directed by your health care provider.   Take medicines only as directed by your health care provider. Follow the directions carefully. Blood pressure medicines must be taken as prescribed. The medicine does not work as well when you skip doses. Skipping doses  also puts you at risk for problems.  Do not smoke.   Monitor your blood pressure at home as directed by your health care provider. SEEK MEDICAL CARE IF:   You think you are having a reaction to medicines taken.  You have recurrent headaches or feel dizzy.  You have swelling in your ankles.  You have trouble with your vision. SEEK IMMEDIATE MEDICAL CARE IF:  You develop a severe headache or confusion.  You have unusual weakness, numbness, or feel faint.  You have severe chest or abdominal pain.  You vomit repeatedly.  You have trouble breathing. MAKE SURE YOU:   Understand these instructions.  Will watch your condition.  Will get help right away if you are not doing well or get worse.   This information is not intended to replace advice given to you by your health care provider. Make sure you discuss any questions you have with your health care provider.   Document Released: 04/20/2005 Document Revised: 09/04/2014 Document Reviewed: 02/10/2013 Elsevier Interactive Patient Education [employer].

## 2015-05-22 ENCOUNTER — Other Ambulatory Visit: Payer: Self-pay

## 2015-05-22 MED ORDER — VITAMIN D (ERGOCALCIFEROL) 1.25 MG (50000 UNIT) PO CAPS: 50000.0000 [IU] | ORAL_CAPSULE | ORAL | Status: DC

## 2015-06-02 DIAGNOSIS — E559 Vitamin D deficiency, unspecified: Secondary | ICD-10-CM | POA: Insufficient documentation

## 2015-06-02 NOTE — Assessment & Plan Note (Signed)
Encouraged heart healthy diet, increase exercise, avoid trans fats, consider a krill oil cap daily 

## 2015-06-02 NOTE — Assessment & Plan Note (Signed)
hgba1c acceptable, minimize simple carbs. Increase exercise as tolerated.  

## 2015-06-02 NOTE — Assessment & Plan Note (Signed)
Avoid offending foods, start probiotics. Do not eat large meals in late evening and consider raising head of bed.  

## 2015-06-02 NOTE — Assessment & Plan Note (Signed)
Labs reveal deficiency. Start on Vitamin D 50000 IU caps, 1 cap po weekly x 12 weeks. Disp #4 with 4 rf. Also take daily Vitamin D over the counter. If already taking a daily supplement increase by 1000 IU daily and if not start Vitamin D 2000 IU daily.  

## 2015-06-02 NOTE — Assessment & Plan Note (Signed)
Did not take his med today, encouraged to go home and take it and let us know if any concerns develop. Minimize caffeine and sodium.

## 2015-11-18 ENCOUNTER — Encounter: Payer: Medicare Other | Admitting: Family Medicine

## 2015-12-04 ENCOUNTER — Other Ambulatory Visit: Payer: Self-pay | Admitting: Family Medicine

## 2015-12-04 NOTE — Telephone Encounter (Signed)
Rx request to pharmacy/SLS  

## 2015-12-31 ENCOUNTER — Other Ambulatory Visit: Payer: Self-pay | Admitting: Family Medicine

## 2016-01-02 ENCOUNTER — Telehealth: Payer: Self-pay | Admitting: Family Medicine

## 2016-01-02 ENCOUNTER — Encounter: Payer: Medicare Other | Admitting: Family Medicine

## 2016-01-02 NOTE — Telephone Encounter (Signed)
Patient lvm 7am apologizing due to back pain he could not make he's 10:30am appointment and will [facility]. Charge or no charge

## 2016-01-02 NOTE — Telephone Encounter (Signed)
No charge. 

## 2016-03-09 ENCOUNTER — Encounter: Payer: Medicare Other | Admitting: Family Medicine

## 2016-03-23 ENCOUNTER — Other Ambulatory Visit: Payer: Self-pay | Admitting: Family Medicine

## 2016-03-23 NOTE — Telephone Encounter (Signed)
Last OV: 05/21/15 ROV: 6-Mths. Patient has ROV scheduled for 06/01/16 Requested drug refills are authorized, however, the patient needs further evaluation and/or laboratory testing before further refills are given. Ask him to make an appointment for this/SLS 11/20

## 2016-03-30 ENCOUNTER — Other Ambulatory Visit: Payer: Self-pay | Admitting: Family Medicine

## 2016-03-30 MED ORDER — SILDENAFIL CITRATE 50 MG PO TABS: 50.0000 mg | ORAL_TABLET | Freq: Every day | ORAL | 3 refills | Status: DC | PRN

## 2016-04-30 ENCOUNTER — Other Ambulatory Visit: Payer: Self-pay | Admitting: Family Medicine

## 2016-05-01 ENCOUNTER — Telehealth: Payer: Self-pay | Admitting: Family Medicine

## 2016-05-01 NOTE — Telephone Encounter (Signed)
Pt would like to come in the morning of his appt. and have his labs completed for his CPE. Pt's appt. is scheduled for 06/01/16. Could orders be placed for pt?

## 2016-05-03 NOTE — Telephone Encounter (Signed)
He can do labs this week if he still wants to.

## 2016-05-05 NOTE — Telephone Encounter (Signed)
Let me know what to order and will do 

## 2016-05-05 NOTE — Telephone Encounter (Signed)
Vitamin d for deficiency, hgba1c for hyperglycemia, tsh, cbc and cmp for HTN and lipid for hyperlipidemia.

## 2016-05-06 ENCOUNTER — Other Ambulatory Visit: Payer: Self-pay | Admitting: Family Medicine

## 2016-05-06 DIAGNOSIS — E785 Hyperlipidemia, unspecified: Secondary | ICD-10-CM

## 2016-05-06 DIAGNOSIS — E559 Vitamin D deficiency, unspecified: Secondary | ICD-10-CM

## 2016-05-06 DIAGNOSIS — I10 Essential (primary) hypertension: Secondary | ICD-10-CM

## 2016-05-06 DIAGNOSIS — R739 Hyperglycemia, unspecified: Secondary | ICD-10-CM

## 2016-05-06 NOTE — Telephone Encounter (Signed)
Labs ordered/put in for lab appt. On 06/01/16. Called on cell/home number informed labs ordered and to come in before appt. On the 29th to do labs.

## 2016-05-13 ENCOUNTER — Telehealth: Payer: Self-pay | Admitting: *Deleted

## 2016-05-13 NOTE — Telephone Encounter (Signed)
08/13/14 PR PPPS, SUBSEQ VISIT [id number] appointment scheduled with [name] for 05/14/16 at 1:30pm and physical with PCP at 2:30pm

## 2016-05-13 NOTE — Telephone Encounter (Signed)
Noted  

## 2016-05-13 NOTE — Telephone Encounter (Signed)
Called patient and left message to return call to schedule AWV w/ [facility]. 

## 2016-05-13 NOTE — Telephone Encounter (Signed)
Both appts are scheduled for 06/01/16.

## 2016-06-01 ENCOUNTER — Ambulatory Visit (INDEPENDENT_AMBULATORY_CARE_PROVIDER_SITE_OTHER): Payer: Medicare Other | Admitting: Family Medicine

## 2016-06-01 ENCOUNTER — Other Ambulatory Visit: Payer: Medicare Other

## 2016-06-01 ENCOUNTER — Encounter: Payer: Self-pay | Admitting: Family Medicine

## 2016-06-01 VITALS — BP 139/72 | HR 80 | Ht 69.0 in | Wt 162.0 lb

## 2016-06-01 DIAGNOSIS — Z23 Encounter for immunization: Secondary | ICD-10-CM | POA: Diagnosis not present

## 2016-06-01 DIAGNOSIS — K219 Gastro-esophageal reflux disease without esophagitis: Secondary | ICD-10-CM

## 2016-06-01 DIAGNOSIS — R739 Hyperglycemia, unspecified: Secondary | ICD-10-CM

## 2016-06-01 DIAGNOSIS — E039 Hypothyroidism, unspecified: Secondary | ICD-10-CM

## 2016-06-01 DIAGNOSIS — E559 Vitamin D deficiency, unspecified: Secondary | ICD-10-CM

## 2016-06-01 DIAGNOSIS — Z0000 Encounter for general adult medical examination without abnormal findings: Secondary | ICD-10-CM | POA: Diagnosis not present

## 2016-06-01 DIAGNOSIS — I10 Essential (primary) hypertension: Secondary | ICD-10-CM

## 2016-06-01 DIAGNOSIS — E782 Mixed hyperlipidemia: Secondary | ICD-10-CM

## 2016-06-01 NOTE — Assessment & Plan Note (Signed)
Encouraged heart healthy diet, increase exercise, avoid trans fats, consider a krill oil cap daily 

## 2016-06-01 NOTE — Assessment & Plan Note (Signed)
Taking a daily supplement check levels today

## 2016-06-01 NOTE — Assessment & Plan Note (Signed)
On Levothyroxine, continue to monitor 

## 2016-06-01 NOTE — Assessment & Plan Note (Signed)
Avoid offending foods, start probiotics. Do not eat large meals in late evening and consider raising head of bed.  

## 2016-06-01 NOTE — Progress Notes (Signed)
Subjective:   Micheal Mcintyre is a 76 y.o. male who presents for Medicare Annual/Subsequent preventive examination.  Review of Systems:  No ROS.  Medicare Wellness Visit.  Cardiac Risk Factors include: advanced age (>67men, >28 women);hypertension;male gender  Home Safety/Smoke Alarms:Feels safe in home. Smoke alarms in place.  Living environment; residence and Firearm Safety: firearms stored safely. Lives with wife.  Seat Belt Safety/Bike Helmet: Wears seat belt.   Counseling:   Eye Exam- Last 8 months ago Dental- Examination once per year. Next appointment in March  Male:   CCS-  Last 2010, Mild diverticulosis otherwise normal results.  PSA-  Lab Results  Component Value Date   PSA 1.23 08/13/2014   PSA 1.11 10/09/2011   PSA 0.91 09/16/2010        Objective:    Vitals: BP 139/72 (BP Location: Right Arm, Patient Position: Sitting)   Pulse 80   Ht 5\' 9"  (1.753 m)   Wt 162 lb (73.5 kg)   SpO2 98%   BMI 23.92 kg/m   Body mass index is 23.92 kg/m.  Tobacco History  Smoking Status  . Never Smoker  Smokeless Tobacco  . Never Used     Counseling given: Not Answered   Past Medical History:  Diagnosis Date  . Arthritis   . GERD (gastroesophageal reflux disease)   . Hypertension   . Medicare annual wellness visit, subsequent 08/19/2014  . Osteoarthritis 09/21/2007   Qualifier: Diagnosis of  By: Niel Hummer MD, Lorinda Creed    Past Surgical History:  Procedure Laterality Date  . CHOLECYSTECTOMY    . HIP SURGERY    . JOINT REPLACEMENT    . TOTAL HIP ARTHROPLASTY     Family History  Problem Relation Age of Onset  . Heart attack Father 69    deceased  . Diabetes Maternal Uncle     maternal grandfather  . Hypertension Neg Hx   . Breast cancer Neg Hx   . Colon cancer Neg Hx   . Prostate cancer Neg Hx    History  Sexual Activity  . Sexual activity: Yes    Outpatient Encounter Prescriptions as of 06/01/2016  Medication Sig  . aspirin 81 MG tablet Take 81 mg  by mouth every other day.   [name] 300 MG CAPS Take 1 capsule by mouth daily.  . sildenafil (VIAGRA) 50 MG tablet Take 1 tablet (50 mg total) by mouth daily as needed.  Marland Kitchen telmisartan (MICARDIS) 80 MG tablet TAKE 1/2 TABLET( 40 MG) BY MOUTH TWICE DAILY (Patient taking differently: TAKE 1/2 TABLET( 40 MG) BY MOUTH DAILY)  . Vitamin D, Ergocalciferol, (DRISDOL) 50000 units CAPS capsule Take 1 capsule (50,000 Units total) by mouth every 7 (seven) days.   No facility-administered encounter medications on file as of 06/01/2016.     Activities of Daily Living In your present state of health, do you have any difficulty performing the following activities: 06/01/2016  Hearing? N  Vision? N  Difficulty concentrating or making decisions? N  Walking or climbing stairs? N  Dressing or bathing? N  Doing errands, shopping? N  Preparing Food and eating ? N  Using the Toilet? N  Managing your Medications? N  Managing your Finances? N  Housekeeping or managing your Housekeeping? N  Some recent data might be hidden    Patient Care Team: [name], MD as PCP - General (Family Medicine)   Assessment:    Physical assessment deferred to PCP.  Exercise Activities and  Dietary recommendations Current Exercise Habits: Structured exercise class, Type of exercise: Other - see comments;walking (swims 4-5 times per week), Time (Minutes): 30, Frequency (Times/Week): 3 (4-5), Weekly Exercise (Minutes/Week): 90, Intensity: Moderate  Diet (meal preparation, eat out, water intake, caffeinated beverages, dairy products, fruits and vegetables): in general, a "healthy" diet  , on average, 3 meals per day. Eats out about 50% of the time.  States he eats a lot of fish and chicken Breakfast: cereal with fruit Lunch: sandwhich Dinner: Meal varies     Goals    . Weight (lb) < 155 lb (70.3 kg) (pt-stated)          Lose 10 pounds      Fall Risk Fall Risk  06/01/2016 08/13/2014  Falls in the past year? Yes No   Number falls in past yr: 2 or more -  Injury with Fall? No -  Risk for fall due to : Impaired balance/gait;Impaired mobility -  Follow up Falls prevention discussed -   Depression Screen PHQ 2/9 Scores 06/01/2016 08/13/2014  PHQ - 2 Score 0 0    Cognitive Function MMSE - Mini Mental State Exam 06/01/2016  Orientation to time 5  Orientation to Place 5  Registration 3  Attention/ Calculation 5  Recall 3  Language- name 2 objects 2  Language- repeat 1  Language- follow 3 step command 3  Language- read & follow direction 1  Write a sentence 1  Copy design 1  Total score 30        Immunization History  Administered Date(s) Administered  . Influenza Whole 02/07/2008, 02/01/2009  . Influenza,inj,Quad PF,36+ Mos 01/24/2013, 01/01/2014  . Influenza-Unspecified 02/23/2015, 03/04/2016  . Pneumococcal Conjugate-13 05/24/2013  . Pneumococcal Polysaccharide-23 02/07/2008, 06/01/2016  . Td 09/28/2007  . Zoster 10/19/2007, 05/02/2009   Screening Tests Health Maintenance  Topic Date Due  . TETANUS/TDAP  09/27/2017  . COLONOSCOPY  04/16/2019  . INFLUENZA VACCINE  Completed  . ZOSTAVAX  Completed  . PNA vac Low Risk Adult  Completed      Plan:    During the course of the visit the patient was educated and counseled about the following appropriate screening and preventive services:   Vaccines to include Pneumoccal, Influenza, Hepatitis B, Td, Zostavax, HCV  Cardiovascular Disease  Colorectal cancer screening  Diabetes screening  Prostate Cancer Screening  Glaucoma screening  Nutrition counseling   Patient Instructions (the written plan) was given to the patient.    Michiel Cowboy, RN  06/01/2016  RN AWV note reviewed. Agree with documention and plan.  Penni Homans, MD

## 2016-06-01 NOTE — Assessment & Plan Note (Signed)
hgba1c acceptable, minimize simple carbs. Increase exercise as tolerated. Ate cereal this morning, birthday cake last night, small piece

## 2016-06-01 NOTE — Patient Instructions (Signed)
Hypertension Hypertension, commonly called high blood pressure, is when the force of blood pumping through your arteries is too strong. Your arteries are the blood vessels that carry blood from your heart throughout your body. A blood pressure reading consists of a higher number over a lower number, such as 110/72. The higher number (systolic) is the pressure inside your arteries when your heart pumps. The lower number (diastolic) is the pressure inside your arteries when your heart relaxes. Ideally you want your blood pressure below 120/80. Hypertension forces your heart to work harder to pump blood. Your arteries may become narrow or stiff. Having untreated or uncontrolled hypertension can cause heart attack, stroke, kidney disease, and other problems. What increases the risk? Some risk factors for high blood pressure are controllable. Others are not. Risk factors you cannot control include:  Race. You may be at higher risk if you are African American.  Age. Risk increases with age.  Gender. Men are at higher risk than women before age 45 years. After age 65, women are at higher risk than men. Risk factors you can control include:  Not getting enough exercise or physical activity.  Being overweight.  Getting too much fat, sugar, calories, or salt in your diet.  Drinking too much alcohol. What are the signs or symptoms? Hypertension does not usually cause signs or symptoms. Extremely high blood pressure (hypertensive crisis) may cause headache, anxiety, shortness of breath, and nosebleed. How is this diagnosed? To check if you have hypertension, your health care provider will measure your blood pressure while you are seated, with your arm held at the level of your heart. It should be measured at least twice using the same arm. Certain conditions can cause a difference in blood pressure between your right and left arms. A blood pressure reading that is higher than normal on one occasion does  not mean that you need treatment. If it is not clear whether you have high blood pressure, you may be asked to return on a different day to have your blood pressure checked again. Or, you may be asked to monitor your blood pressure at home for 1 or more weeks. How is this treated? Treating high blood pressure includes making lifestyle changes and possibly taking medicine. Living a healthy lifestyle can help lower high blood pressure. You may need to change some of your habits. Lifestyle changes may include:  Following the DASH diet. This diet is high in fruits, vegetables, and whole grains. It is low in salt, red meat, and added sugars.  Keep your sodium intake below 2,300 mg per day.  Getting at least 30-45 minutes of aerobic exercise at least 4 times per week.  Losing weight if necessary.  Not smoking.  Limiting alcoholic beverages.  Learning ways to reduce stress. Your health care provider may prescribe medicine if lifestyle changes are not enough to get your blood pressure under control, and if one of the following is true:  You are 18-59 years of age and your systolic blood pressure is above 140.  You are 60 years of age or older, and your systolic blood pressure is above 150.  Your diastolic blood pressure is above 90.  You have diabetes, and your systolic blood pressure is over 140 or your diastolic blood pressure is over 90.  You have kidney disease and your blood pressure is above 140/90.  You have heart disease and your blood pressure is above 140/90. Your personal target blood pressure may vary depending on your medical   conditions, your age, and other factors. Follow these instructions at home:  Have your blood pressure rechecked as directed by your health care provider.  Take medicines only as directed by your health care provider. Follow the directions carefully. Blood pressure medicines must be taken as prescribed. The medicine does not work as well when you skip  doses. Skipping doses also puts you at risk for problems.  Do not smoke.  Monitor your blood pressure at home as directed by your health care provider. Contact a health care provider if:  You think you are having a reaction to medicines taken.  You have recurrent headaches or feel dizzy.  You have swelling in your ankles.  You have trouble with your vision. Get help right away if:  You develop a severe headache or confusion.  You have unusual weakness, numbness, or feel faint.  You have severe chest or abdominal pain.  You vomit repeatedly.  You have trouble breathing. This information is not intended to replace advice given to you by your health care provider. Make sure you discuss any questions you have with your health care provider. Document Released: 04/20/2005 Document Revised: 09/26/2015 Document Reviewed: 02/10/2013 Elsevier Interactive Patient Education  2017 Elsevier Inc.  

## 2016-06-01 NOTE — Assessment & Plan Note (Addendum)
Well controlled, no changes to meds. Encouraged heart healthy diet such as the DASH diet and exercise as tolerated. Last summer his blood pressure was running low at 100/50 when he was working in the yard. He dropped to 1/2 tab and then it has been much better. Some days his systolic pressure can be up to 145 so will increase back to 1/2 tab po bid and call if any concerns

## 2016-06-01 NOTE — Progress Notes (Signed)
Pre visit review using our clinic review tool, if applicable. No additional management support is needed unless otherwise documented below in the visit note. 

## 2016-06-02 LAB — COMPREHENSIVE METABOLIC PANEL
ALK PHOS: 76 U/L (ref 39–117)
ALT: 15 U/L (ref 0–53)
AST: 12 U/L (ref 0–37)
Albumin: 4.5 g/dL (ref 3.5–5.2)
BILIRUBIN TOTAL: 0.6 mg/dL (ref 0.2–1.2)
BUN: 21 mg/dL (ref 6–23)
CALCIUM: 9.8 mg/dL (ref 8.4–10.5)
CO2: 31 meq/L (ref 19–32)
Chloride: 105 mEq/L (ref 96–112)
Creatinine, Ser: 1.05 mg/dL (ref 0.40–1.50)
GFR: 73.55 mL/min (ref >60.00)
Glucose, Bld: 90 mg/dL (ref 70–99)
Potassium: 4.9 mEq/L (ref 3.5–5.1)
Sodium: 141 mEq/L (ref 135–145)
Total Protein: 7.4 g/dL (ref 6.0–8.3)

## 2016-06-02 LAB — LIPID PANEL
Cholesterol: 213 mg/dL — ABNORMAL HIGH (ref 0–200)
HDL: 58.1 mg/dL (ref >39.00)
LDL Cholesterol: 129 mg/dL — ABNORMAL HIGH (ref 0–99)
NonHDL: 154.51
TRIGLYCERIDES: 130 mg/dL (ref 0.0–149.0)
Total CHOL/HDL Ratio: 4
VLDL: 26 mg/dL (ref 0.0–40.0)

## 2016-06-02 LAB — CBC
HCT: 44 % (ref 39.0–52.0)
HEMOGLOBIN: 14.8 g/dL (ref 13.0–17.0)
MCHC: 33.6 g/dL (ref 30.0–36.0)
MCV: 92.5 fl (ref 78.0–100.0)
Platelets: 155 10*3/uL (ref 150.0–400.0)
RBC: 4.76 Mil/uL (ref 4.22–5.81)
RDW: 13.2 % (ref 11.5–15.5)
WBC: 6.9 10*3/uL (ref 4.0–10.5)

## 2016-06-02 LAB — HEMOGLOBIN A1C: Hgb A1c MFr Bld: 6.1 % (ref 4.6–6.5)

## 2016-06-02 LAB — TSH: TSH: 1.89 u[IU]/mL (ref 0.35–4.50)

## 2016-06-03 ENCOUNTER — Other Ambulatory Visit: Payer: Self-pay | Admitting: Family Medicine

## 2016-06-03 MED ORDER — ROSUVASTATIN CALCIUM 5 MG PO TABS: 5.0000 mg | ORAL_TABLET | Freq: Every day | ORAL | 3 refills | Status: DC

## 2016-06-04 LAB — VITAMIN D 1,25 DIHYDROXY
VITAMIN D 1, 25 (OH) TOTAL: 42 pg/mL (ref 18–72)
VITAMIN D3 1, 25 (OH): 42 pg/mL
Vitamin D2 1, 25 (OH)2: <8 pg/mL

## 2016-06-07 ENCOUNTER — Encounter: Payer: Self-pay | Admitting: Family Medicine

## 2016-06-07 DIAGNOSIS — Z0000 Encounter for general adult medical examination without abnormal findings: Secondary | ICD-10-CM

## 2016-06-07 HISTORY — DX: Encounter for general adult medical examination without abnormal findings: Z00.00

## 2016-06-07 NOTE — Assessment & Plan Note (Signed)
Patient encouraged to maintain heart healthy diet, regular exercise, adequate sleep. Consider daily probiotics. Take medications as prescribed. Given and reviewed copy of ACP documents from [place] Secretary of State and encouraged to complete and return 

## 2016-06-07 NOTE — Progress Notes (Signed)
Patient ID: Micheal Mcintyre, male   DOB: 03/13/1941, 76 y.o.   MRN: [id number]   Subjective:    Patient ID: Micheal Mcintyre, male    DOB: 10-25-1940, 76 y.o.   MRN: [id number]  Chief Complaint  Patient presents with  . Medicare Wellness    done with [name]  . Annual Exam  . Hypertension    HPI Patient is in today for annual preventative exam and follow up on numerous medical concerns incluidng hypertension, hyperglycemia and hypothyroidism. He feels well. No recent illness or acute concerns. No recent hospitalizations. He denies any polyuria or polydipsia. Tries to maintain a heart healthy diet and stay active. Is doing well with ADLs at home. Denies CP/palp/SOB/HA/congestion/fevers/GI or GU c/o. Taking meds as prescribed  Past Medical History:  Diagnosis Date  . Arthritis   . GERD (gastroesophageal reflux disease)   . Hypertension   . Medicare annual wellness visit, subsequent 08/19/2014  . Osteoarthritis 09/21/2007   Qualifier: Diagnosis of  By: Niel Hummer MD, [place] Preventative health care 06/07/2016    Past Surgical History:  Procedure Laterality Date  . CHOLECYSTECTOMY    . HIP SURGERY    . JOINT REPLACEMENT    . TOTAL HIP ARTHROPLASTY      Family History  Problem Relation Age of Onset  . Heart attack Father 49    deceased  . Diabetes Maternal Uncle     maternal grandfather  . Hypertension Neg Hx   . Breast cancer Neg Hx   . Colon cancer Neg Hx   . Prostate cancer Neg Hx     Social History   Social History  . Marital status: Married    Spouse name: N/A  . Number of children: N/A  . Years of education: N/A   Occupational History  . Not on file.   Social History Main Topics  . Smoking status: Never Smoker  . Smokeless tobacco: Never Used  . Alcohol use Yes     Comment: occ  . Drug use: No  . Sexual activity: Yes   Other Topics Concern  . Not on file   Social History Narrative  . No narrative on file    Outpatient Medications Prior to Visit    Medication Sig Dispense Refill  . aspirin 81 MG tablet Take 81 mg by mouth every other day.     [name] 300 MG CAPS Take 1 capsule by mouth daily.    . sildenafil (VIAGRA) 50 MG tablet Take 1 tablet (50 mg total) by mouth daily as needed. 10 tablet 3  . telmisartan (MICARDIS) 80 MG tablet TAKE 1/2 TABLET( 40 MG) BY MOUTH TWICE DAILY (Patient taking differently: TAKE 1/2 TABLET( 40 MG) BY MOUTH DAILY) 30 tablet 0  . Vitamin D, Ergocalciferol, (DRISDOL) 50000 units CAPS capsule Take 1 capsule (50,000 Units total) by mouth every 7 (seven) days. 4 capsule 4   No facility-administered medications prior to visit.     No Known Allergies  Review of Systems  Constitutional: Negative for chills, fever and malaise/fatigue.  HENT: Negative for congestion and hearing loss.   Eyes: Negative for discharge.  Respiratory: Negative for cough, sputum production and shortness of breath.   Cardiovascular: Negative for chest pain, palpitations and leg swelling.  Gastrointestinal: Negative for abdominal pain, blood in stool, constipation, diarrhea, heartburn, nausea and vomiting.  Genitourinary: Negative for dysuria, frequency, hematuria and urgency.  Musculoskeletal: Negative for back pain, falls and myalgias.  Skin: Negative  for rash.  Neurological: Negative for dizziness, sensory change, loss of consciousness, weakness and headaches.  Endo/Heme/Allergies: Negative for environmental allergies. Does not bruise/bleed easily.  Psychiatric/Behavioral: Negative for depression and suicidal ideas. The patient is not nervous/anxious and does not have insomnia.        Objective:    Physical Exam  Constitutional: He is oriented to person, place, and time. He appears well-developed and well-nourished. No distress.  HENT:  Head: Normocephalic and atraumatic.  Eyes: Conjunctivae are normal.  Neck: Neck supple. No thyromegaly present.  Cardiovascular: Normal rate, regular rhythm and normal heart sounds.   No  murmur heard. Pulmonary/Chest: Effort normal and breath sounds normal. No respiratory distress. He has no wheezes.  Abdominal: Soft. Bowel sounds are normal. He exhibits no mass. There is no tenderness.  Musculoskeletal: He exhibits no edema.  Lymphadenopathy:    He has no cervical adenopathy.  Neurological: He is alert and oriented to person, place, and time.  Skin: Skin is warm and dry.  Psychiatric: He has a normal mood and affect. His behavior is normal.    BP 139/72 (BP Location: Right Arm, Patient Position: Sitting)   Pulse 80   Ht 5\' 9"  (1.753 m)   Wt 162 lb (73.5 kg)   SpO2 98%   BMI 23.92 kg/m  Wt Readings from Last 3 Encounters:  06/01/16 162 lb (73.5 kg)  05/21/15 162 lb (73.5 kg)  08/13/14 161 lb 2 oz (73.1 kg)     Lab Results  Component Value Date   WBC 6.9 06/01/2016   HGB 14.8 06/01/2016   HCT 44.0 06/01/2016   PLT 155.0 06/01/2016   GLUCOSE 90 06/01/2016   CHOL 213 (H) 06/01/2016   TRIG 130.0 06/01/2016   HDL 58.10 06/01/2016   LDLCALC 129 (H) 06/01/2016   ALT 15 06/01/2016   AST 12 06/01/2016   NA 141 06/01/2016   K 4.9 06/01/2016   CL 105 06/01/2016   CREATININE 1.05 06/01/2016   BUN 21 06/01/2016   CO2 31 06/01/2016   TSH 1.89 06/01/2016   PSA 1.23 08/13/2014   HGBA1C 6.1 06/01/2016    Lab Results  Component Value Date   TSH 1.89 06/01/2016   Lab Results  Component Value Date   WBC 6.9 06/01/2016   HGB 14.8 06/01/2016   HCT 44.0 06/01/2016   MCV 92.5 06/01/2016   PLT 155.0 06/01/2016   Lab Results  Component Value Date   NA 141 06/01/2016   K 4.9 06/01/2016   CO2 31 06/01/2016   GLUCOSE 90 06/01/2016   BUN 21 06/01/2016   CREATININE 1.05 06/01/2016   BILITOT 0.6 06/01/2016   ALKPHOS 76 06/01/2016   AST 12 06/01/2016   ALT 15 06/01/2016   PROT 7.4 06/01/2016   ALBUMIN 4.5 06/01/2016   CALCIUM 9.8 06/01/2016   GFR 73.55 06/01/2016   Lab Results  Component Value Date   CHOL 213 (H) 06/01/2016   Lab Results  Component  Value Date   HDL 58.10 06/01/2016   Lab Results  Component Value Date   LDLCALC 129 (H) 06/01/2016   Lab Results  Component Value Date   TRIG 130.0 06/01/2016   Lab Results  Component Value Date   CHOLHDL 4 06/01/2016   Lab Results  Component Value Date   HGBA1C 6.1 06/01/2016       Assessment & Plan:   Problem List Items Addressed This Visit    Hypothyroidism    On Levothyroxine, continue to monitor  Hyperlipidemia, mixed    Encouraged heart healthy diet, increase exercise, avoid trans fats, consider a krill oil cap daily      Relevant Orders   Lipid panel (Completed)   Essential hypertension    Well controlled, no changes to meds. Encouraged heart healthy diet such as the DASH diet and exercise as tolerated. Last summer his blood pressure was running low at 100/50 when he was working in the yard. He dropped to 1/2 tab and then it has been much better. Some days his systolic pressure can be up to 145 so will increase back to 1/2 tab po bid and call if any concerns      Relevant Orders   CBC (Completed)   Comprehensive metabolic panel (Completed)   TSH (Completed)   GERD    Avoid offending foods, start probiotics. Do not eat large meals in late evening and consider raising head of bed.       Hyperglycemia    hgba1c acceptable, minimize simple carbs. Increase exercise as tolerated. Ate cereal this morning, birthday cake last night, small piece      Relevant Orders   Hemoglobin A1c (Completed)   Vitamin D deficiency    Taking a daily supplement check levels today      Relevant Orders   Vitamin D 1,25 dihydroxy (Completed)   Preventative health care    Patient encouraged to maintain heart healthy diet, regular exercise, adequate sleep. Consider daily probiotics. Take medications as prescribed. Given and reviewed copy of ACP documents from [place] of State and encouraged to complete and return       Other Visit Diagnoses    Need for pneumococcal  vaccination    -  Primary   Relevant Orders   Pneumococcal polysaccharide vaccine 23-valent greater than or equal to 2yo subcutaneous/IM (Completed)   Encounter for Medicare annual wellness exam          I am having Mr. Spolar maintain his aspirin, Krill Oil, Vitamin D (Ergocalciferol), sildenafil, and telmisartan.  No orders of the defined types were placed in this encounter.    Penni Homans, MD

## 2016-07-03 ENCOUNTER — Other Ambulatory Visit: Payer: Self-pay | Admitting: Family Medicine

## 2016-07-30 ENCOUNTER — Other Ambulatory Visit: Payer: Self-pay | Admitting: Family Medicine

## 2016-10-11 ENCOUNTER — Other Ambulatory Visit: Payer: Self-pay | Admitting: Family Medicine

## 2016-11-22 ENCOUNTER — Other Ambulatory Visit: Payer: Self-pay | Admitting: Family Medicine

## 2016-12-03 ENCOUNTER — Encounter: Payer: Self-pay | Admitting: Family Medicine

## 2016-12-03 ENCOUNTER — Ambulatory Visit (INDEPENDENT_AMBULATORY_CARE_PROVIDER_SITE_OTHER): Payer: Medicare Other | Admitting: Family Medicine

## 2016-12-03 DIAGNOSIS — R739 Hyperglycemia, unspecified: Secondary | ICD-10-CM | POA: Diagnosis not present

## 2016-12-03 DIAGNOSIS — E782 Mixed hyperlipidemia: Secondary | ICD-10-CM

## 2016-12-03 DIAGNOSIS — E039 Hypothyroidism, unspecified: Secondary | ICD-10-CM | POA: Diagnosis not present

## 2016-12-03 DIAGNOSIS — E559 Vitamin D deficiency, unspecified: Secondary | ICD-10-CM | POA: Diagnosis not present

## 2016-12-03 DIAGNOSIS — I10 Essential (primary) hypertension: Secondary | ICD-10-CM

## 2016-12-03 DIAGNOSIS — Z0001 Encounter for general adult medical examination with abnormal findings: Secondary | ICD-10-CM | POA: Diagnosis not present

## 2016-12-03 DIAGNOSIS — L578 Other skin changes due to chronic exposure to nonionizing radiation: Secondary | ICD-10-CM

## 2016-12-03 DIAGNOSIS — M25511 Pain in right shoulder: Secondary | ICD-10-CM | POA: Insufficient documentation

## 2016-12-03 DIAGNOSIS — Z0000 Encounter for general adult medical examination without abnormal findings: Secondary | ICD-10-CM

## 2016-12-03 HISTORY — DX: Pain in right shoulder: M25.511

## 2016-12-03 LAB — COMPREHENSIVE METABOLIC PANEL
ALBUMIN: 4.3 g/dL (ref 3.5–5.2)
ALT: 14 U/L (ref 0–53)
AST: 12 U/L (ref 0–37)
Alkaline Phosphatase: 70 U/L (ref 39–117)
BUN: 20 mg/dL (ref 6–23)
CHLORIDE: 105 meq/L (ref 96–112)
CO2: 29 meq/L (ref 19–32)
CREATININE: 1.11 mg/dL (ref 0.40–1.50)
Calcium: 9.4 mg/dL (ref 8.4–10.5)
GFR: 68.88 mL/min (ref >60.00)
GLUCOSE: 104 mg/dL — AB (ref 70–99)
POTASSIUM: 4.3 meq/L (ref 3.5–5.1)
SODIUM: 140 meq/L (ref 135–145)
Total Bilirubin: 0.9 mg/dL (ref 0.2–1.2)
Total Protein: 7 g/dL (ref 6.0–8.3)

## 2016-12-03 LAB — CBC
HEMATOCRIT: 42.5 % (ref 39.0–52.0)
HEMOGLOBIN: 14.2 g/dL (ref 13.0–17.0)
MCHC: 33.5 g/dL (ref 30.0–36.0)
MCV: 94.2 fl (ref 78.0–100.0)
Platelets: 135 10*3/uL — ABNORMAL LOW (ref 150.0–400.0)
RBC: 4.51 Mil/uL (ref 4.22–5.81)
RDW: 12.7 % (ref 11.5–15.5)
WBC: 4.7 10*3/uL (ref 4.0–10.5)

## 2016-12-03 LAB — LIPID PANEL
CHOL/HDL RATIO: 3
Cholesterol: 137 mg/dL (ref 0–200)
HDL: 54.1 mg/dL (ref >39.00)
LDL CALC: 68 mg/dL (ref 0–99)
NONHDL: 82.94
TRIGLYCERIDES: 76 mg/dL (ref 0.0–149.0)
VLDL: 15.2 mg/dL (ref 0.0–40.0)

## 2016-12-03 LAB — TSH: TSH: 1.42 u[IU]/mL (ref 0.35–4.50)

## 2016-12-03 LAB — HEMOGLOBIN A1C: Hgb A1c MFr Bld: 6.1 % (ref 4.6–6.5)

## 2016-12-03 LAB — VITAMIN D 25 HYDROXY (VIT D DEFICIENCY, FRACTURES): VITD: 39.27 ng/mL (ref 30.00–100.00)

## 2016-12-03 MED ORDER — VITAMIN D (ERGOCALCIFEROL) 1.25 MG (50000 UNIT) PO CAPS: 50000.0000 [IU] | ORAL_CAPSULE | ORAL | 4 refills | Status: DC

## 2016-12-03 NOTE — Assessment & Plan Note (Signed)
Well controlled, no changes to meds. Encouraged heart healthy diet such as the DASH diet and exercise as tolerated.  °

## 2016-12-03 NOTE — Assessment & Plan Note (Signed)
Encouraged heart healthy diet, increase exercise, avoid trans fats, consider a krill oil cap daily 

## 2016-12-03 NOTE — Progress Notes (Signed)
Subjective:  I acted as a Education administrator for Dr. Charlett Blake. [place], [place]  Patient ID: Micheal Mcintyre, male    DOB: [birth year], 76 y.o.   MRN: [id number]  No chief complaint on file.   HPI  Patient is in today for an annual exam. He feels well today. 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 No cause was found and no recurrence has occurred. He has ongoing trouble with his right shoulder worse with movement. Denies any recent traumas or fall. No radicular symptoms. No recent febrile illness or hospitalization. Is trying to maintain a heart healthy diet. Activities of daily living are not difficult. Denies CP/palp/SOB/HA/congestion/fevers/GI or GU c/o. Taking meds as prescribed  Patient Care Team: [name], MD as PCP - General (Family Medicine)   Past Medical History:  Diagnosis Date  . Arthritis   . GERD (gastroesophageal reflux disease)   . Hypertension   . Medicare annual wellness visit, subsequent 08/19/2014  . Osteoarthritis 09/21/2007   Qualifier: Diagnosis of  By: Niel Hummer MD, [place] [facility] 06/07/2016  . Right shoulder pain 12/03/2016    Past Surgical History:  Procedure Laterality Date  . CHOLECYSTECTOMY    . HIP SURGERY    . JOINT REPLACEMENT    . TOTAL HIP ARTHROPLASTY      Family History  Problem Relation Age of Onset  . Heart attack Father 14       deceased  . Diabetes Maternal Uncle        maternal grandfather  . Hypertension Neg Hx   . Breast cancer Neg Hx   . Colon cancer Neg Hx   . Prostate cancer Neg Hx     Social History   Social History  . Marital status: Married    Spouse name: N/A  . Number of children: N/A  . Years of education: N/A   Occupational History  . Not on file.   Social History Main Topics  . Smoking status: Never Smoker  . Smokeless tobacco: Never Used  . Alcohol use Yes     Comment: occ  . Drug use: No  . Sexual activity:  Yes   Other Topics Concern  . Not on file   Social History Narrative  . No narrative on file    Outpatient Medications Prior to Visit  Medication Sig Dispense Refill  . Krill Oil 300 MG CAPS Take 1 capsule by mouth daily.    . rosuvastatin (CRESTOR) 5 MG tablet TAKE 1 TABLET(5 MG) BY MOUTH DAILY 30 tablet 0  . sildenafil (VIAGRA) 50 MG tablet Take 1 tablet (50 mg total) by mouth daily as needed. 10 tablet 3  . telmisartan (MICARDIS) 80 MG tablet TAKE 1/2 TABLET( 40 MG) BY MOUTH TWICE DAILY 30 tablet 5  . aspirin 81 MG tablet Take 81 mg by mouth every other day.     . Vitamin D, Ergocalciferol, (DRISDOL) 50000 units CAPS capsule Take 1 capsule (50,000 Units total) by mouth every 7 (seven) days. 4 capsule 4   No facility-administered medications prior to visit.     No Known Allergies  Review of Systems  Constitutional: Negative for fever and malaise/fatigue.  HENT: Negative for congestion.   Eyes: Negative for blurred vision.  Respiratory: Negative for shortness of breath.   Cardiovascular: Negative for chest pain, palpitations and leg swelling.  Gastrointestinal: Positive  for blood in stool. Negative for abdominal pain and nausea.  Genitourinary: Negative for dysuria and frequency.  Musculoskeletal: Positive for joint pain. Negative for falls.  Skin: Negative for rash.  Neurological: Negative for dizziness, loss of consciousness and headaches.  Endo/Heme/Allergies: Negative for environmental allergies.  Psychiatric/Behavioral: Negative for depression. The patient is not nervous/anxious.        Objective:    Physical Exam  Constitutional: He is oriented to person, place, and time. He appears well-developed and well-nourished. No distress.  HENT:  Head: Normocephalic and atraumatic.  Nose: Nose normal.  Eyes: Right eye exhibits no discharge. Left eye exhibits no discharge.  Neck: Normal range of motion. Neck supple.  Cardiovascular: Normal rate and regular rhythm.   No  murmur heard. Pulmonary/Chest: Effort normal and breath sounds normal.  Abdominal: Soft. Bowel sounds are normal. There is no tenderness.  Musculoskeletal: He exhibits no edema.  Neurological: He is alert and oriented to person, place, and time.  Skin: Skin is warm and dry.  Psychiatric: He has a normal mood and affect.  Nursing note and vitals reviewed.   There were no vitals taken for this visit. Wt Readings from Last 3 Encounters:  06/01/16 162 lb (73.5 kg)  05/21/15 162 lb (73.5 kg)  08/13/14 161 lb 2 oz (73.1 kg)   BP Readings from Last 3 Encounters:  06/01/16 139/72  05/21/15 (!) 142/78  08/13/14 114/64     Immunization History  Administered Date(s) Administered  . Influenza Whole 02/07/2008, 02/01/2009  . Influenza,inj,Quad PF,36+ Mos 01/24/2013, 01/01/2014  . Influenza-Unspecified 02/23/2015, 03/04/2016  . Pneumococcal Conjugate-13 05/24/2013  . Pneumococcal Polysaccharide-23 02/07/2008, 06/01/2016  . Td 09/28/2007  . Zoster 10/19/2007, 05/02/2009    Health Maintenance  Topic Date Due  . INFLUENZA VACCINE  12/02/2016  . TETANUS/TDAP  09/27/2017  . COLONOSCOPY  04/16/2019  . PNA vac Low Risk Adult  Completed    Lab Results  Component Value Date   WBC 4.7 12/03/2016   HGB 14.2 12/03/2016   HCT 42.5 12/03/2016   PLT 135.0 (L) 12/03/2016   GLUCOSE 104 (H) 12/03/2016   CHOL 137 12/03/2016   TRIG 76.0 12/03/2016   HDL 54.10 12/03/2016   LDLCALC 68 12/03/2016   ALT 14 12/03/2016   AST 12 12/03/2016   NA 140 12/03/2016   K 4.3 12/03/2016   CL 105 12/03/2016   CREATININE 1.11 12/03/2016   BUN 20 12/03/2016   CO2 29 12/03/2016   TSH 1.42 12/03/2016   PSA 1.23 08/13/2014   HGBA1C 6.1 12/03/2016    Lab Results  Component Value Date   TSH 1.42 12/03/2016   Lab Results  Component Value Date   WBC 4.7 12/03/2016   HGB 14.2 12/03/2016   HCT 42.5 12/03/2016   MCV 94.2 12/03/2016   PLT 135.0 (L) 12/03/2016   Lab Results  Component Value Date   NA  140 12/03/2016   K 4.3 12/03/2016   CO2 29 12/03/2016   GLUCOSE 104 (H) 12/03/2016   BUN 20 12/03/2016   CREATININE 1.11 12/03/2016   BILITOT 0.9 12/03/2016   ALKPHOS 70 12/03/2016   AST 12 12/03/2016   ALT 14 12/03/2016   PROT 7.0 12/03/2016   ALBUMIN 4.3 12/03/2016   CALCIUM 9.4 12/03/2016   GFR 68.88 12/03/2016   Lab Results  Component Value Date   CHOL 137 12/03/2016   Lab Results  Component Value Date   HDL 54.10 12/03/2016   Lab Results  Component Value Date  [place] 68 12/03/2016   Lab Results  Component Value Date   TRIG 76.0 12/03/2016   Lab Results  Component Value Date   CHOLHDL 3 12/03/2016   Lab Results  Component Value Date   HGBA1C 6.1 12/03/2016         Assessment & Plan:   Problem List Items Addressed This Visit    Hypothyroidism    On Levothyroxine, continue to monitor      Hyperlipidemia, mixed    Encouraged heart healthy diet, increase exercise, avoid trans fats, consider a krill oil cap daily      Relevant Orders   Lipid panel (Completed)   Essential hypertension    Well controlled, no changes to meds. Encouraged heart healthy diet such as the DASH diet and exercise as tolerated.       Relevant Orders   CBC (Completed)   Comprehensive metabolic panel (Completed)   TSH (Completed)   Hyperglycemia    hgba1c acceptable, minimize simple carbs. Increase exercise as tolerated.       Relevant Orders   Hemoglobin A1c (Completed)   Vitamin D deficiency - Primary    Encouraged daily supplements and will monitor      Relevant Orders   VITAMIN D 25 Hydroxy (Vit-D Deficiency, Fractures) (Completed)   Preventative health care    Patient encouraged to maintain heart healthy diet, regular exercise, adequate sleep. Consider daily probiotics. Take medications as prescribed. Consider Shingrix.       Right shoulder pain    2 Advil in am but worse with heavy yard work etc. Consider topical med Salon Pas or Aspercreme NSAID gel.  Consider a sports med consult as needed      Sun-damaged skin    Referred to dermatology for further consideration      Relevant Orders   Ambulatory referral to Dermatology      I have discontinued Mr. Conradt aspirin. I am also having him maintain his Krill Oil, sildenafil, telmisartan, rosuvastatin, and Vitamin D (Ergocalciferol).  Meds ordered this encounter  Medications  . Vitamin D, Ergocalciferol, (DRISDOL) 50000 units CAPS capsule    Sig: Take 1 capsule (50,000 Units total) by mouth every 7 (seven) days.    Dispense:  4 capsule    Refill:  4    CMA served as scribe during this visit. History, Physical and Plan performed by medical provider. Documentation and orders reviewed and attested to.  Penni Homans, MD

## 2016-12-03 NOTE — Assessment & Plan Note (Signed)
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 Consider topical med Salon Pas or Aspercreme NSAID gel. Consider a sports med consult as needed

## 2016-12-03 NOTE — Patient Instructions (Addendum)
Encouraged increased hydration and fiber in diet. Daily probiotics. If bowels not moving can use MOM 2 tbls po in 4 oz of warm prune juice by mouth every 2-3 days. If no results then repeat in 4 hours with  Dulcolax suppository pr, may repeat again in 4 more hours as needed. Seek care if symptoms worsen. Consider daily Miralax and/or Dulcolax if symptoms persist.   Try a topical product NSAID by aspercreme, Salon Pas gel or aspercreme, Icy Hot, Salon Pas lidocaine gel as well.   Shingrix is the new shingles 2 shots over 6 months.  Preventive Care 74 Years and Older, Male Preventive care refers to lifestyle choices and visits with your health care provider that can promote health and wellness. What does preventive care include?  A yearly physical exam. This is also called an annual well check.  Dental exams once or twice a year.  Routine eye exams. Ask your health care provider how often you should have your eyes checked.  Personal lifestyle choices, including: ? Daily care of your teeth and gums. ? Regular physical activity. ? Eating a healthy diet. ? Avoiding tobacco and drug use. ? Limiting alcohol use. ? Practicing safe sex. ? Taking low doses of aspirin every day. ? Taking vitamin and mineral supplements as recommended by your health care provider. What happens during an annual well check? The services and screenings done by your health care provider during your annual well check will depend on your age, overall health, lifestyle risk factors, and family history of disease. Counseling Your health care provider may ask you questions about your:  Alcohol use.  Tobacco use.  Drug use.  Emotional well-being.  Home and relationship well-being.  Sexual activity.  Eating habits.  History of falls.  Memory and ability to understand (cognition).  Work and work Statistician.  Screening You may have the following tests or measurements:  Height, weight, and BMI.  Blood  pressure.  Lipid and cholesterol levels. These may be checked every 5 years, or more frequently if you are over 3 years old.  Skin check.  Lung cancer screening. You may have this screening every year starting at age 39 if you have a 30-pack-year history of smoking and currently smoke or have quit within the past 15 years.  Fecal occult blood test (FOBT) of the stool. You may have this test every year starting at age 24.  Flexible sigmoidoscopy or colonoscopy. You may have a sigmoidoscopy every 5 years or a colonoscopy every 10 years starting at age 2.  Prostate cancer screening. Recommendations will vary depending on your family history and other risks.  Hepatitis C blood test.  Hepatitis B blood test.  Sexually transmitted disease (STD) testing.  Diabetes screening. This is done by checking your blood sugar (glucose) after you have not eaten for a while (fasting). You may have this done every 1-3 years.  Abdominal aortic aneurysm (AAA) screening. You may need this if you are a current or former smoker.  Osteoporosis. You may be screened starting at age 32 if you are at high risk.  Talk with your health care provider about your test results, treatment options, and if necessary, the need for more tests. Vaccines Your health care provider may recommend certain vaccines, such as:  Influenza vaccine. This is recommended every year.  Tetanus, diphtheria, and acellular pertussis (Tdap, Td) vaccine. You may need a Td booster every 10 years.  Varicella vaccine. You may need this if you have not been vaccinated.  Zoster vaccine. You may need this after age 71.  Measles, mumps, and rubella (MMR) vaccine. You may need at least one dose of MMR if you were born in 1957 or later. You may also need a second dose.  Pneumococcal 13-valent conjugate (PCV13) vaccine. One dose is recommended after age 40.  Pneumococcal polysaccharide (PPSV23) vaccine. One dose is recommended after age  60.  Meningococcal vaccine. You may need this if you have certain conditions.  Hepatitis A vaccine. You may need this if you have certain conditions or if you travel or work in places where you may be exposed to hepatitis A.  Hepatitis B vaccine. You may need this if you have certain conditions or if you travel or work in places where you may be exposed to hepatitis B.  Haemophilus influenzae type b (Hib) vaccine. You may need this if you have certain risk factors.  Talk to your health care provider about which screenings and vaccines you need and how often you need them. This information is not intended to replace advice given to you by your health care provider. Make sure you discuss any questions you have with your health care provider. Document Released: 05/17/2015 Document Revised: 01/08/2016 Document Reviewed: 02/19/2015 Elsevier Interactive Patient Education  2017 [employer].

## 2016-12-03 NOTE — Assessment & Plan Note (Signed)
hgba1c acceptable, minimize simple carbs. Increase exercise as tolerated.  

## 2016-12-03 NOTE — Assessment & Plan Note (Signed)
On Levothyroxine, continue to monitor 

## 2016-12-03 NOTE — Assessment & Plan Note (Signed)
Patient encouraged to maintain heart healthy diet, regular exercise, adequate sleep. Consider daily probiotics. Take medications as prescribed. Consider Shingrix.

## 2016-12-06 DIAGNOSIS — L578 Other skin changes due to chronic exposure to nonionizing radiation: Secondary | ICD-10-CM | POA: Insufficient documentation

## 2016-12-06 NOTE — Assessment & Plan Note (Signed)
Referred to dermatology for further consideration.  

## 2016-12-06 NOTE — Assessment & Plan Note (Signed)
Encouraged daily supplements and will monitor 

## 2017-01-06 ENCOUNTER — Other Ambulatory Visit: Payer: Self-pay | Admitting: Family Medicine

## 2017-01-06 ENCOUNTER — Encounter: Payer: Self-pay | Admitting: Family Medicine

## 2017-01-07 ENCOUNTER — Other Ambulatory Visit: Payer: Self-pay

## 2017-01-07 MED ORDER — ROSUVASTATIN CALCIUM 5 MG PO TABS: ORAL_TABLET | ORAL | 5 refills | Status: DC

## 2017-02-02 ENCOUNTER — Other Ambulatory Visit: Payer: Self-pay | Admitting: Family Medicine

## 2017-05-10 ENCOUNTER — Other Ambulatory Visit: Payer: Self-pay | Admitting: Family Medicine

## 2017-06-02 ENCOUNTER — Other Ambulatory Visit: Payer: Self-pay | Admitting: Family Medicine

## 2017-06-04 ENCOUNTER — Other Ambulatory Visit: Payer: Self-pay | Admitting: Family Medicine

## 2017-06-07 ENCOUNTER — Encounter: Payer: Self-pay | Admitting: Family Medicine

## 2017-06-07 ENCOUNTER — Ambulatory Visit (INDEPENDENT_AMBULATORY_CARE_PROVIDER_SITE_OTHER): Payer: Medicare Other | Admitting: Family Medicine

## 2017-06-07 DIAGNOSIS — E559 Vitamin D deficiency, unspecified: Secondary | ICD-10-CM | POA: Diagnosis not present

## 2017-06-07 DIAGNOSIS — R739 Hyperglycemia, unspecified: Secondary | ICD-10-CM

## 2017-06-07 DIAGNOSIS — M25511 Pain in right shoulder: Secondary | ICD-10-CM

## 2017-06-07 DIAGNOSIS — E782 Mixed hyperlipidemia: Secondary | ICD-10-CM | POA: Diagnosis not present

## 2017-06-07 DIAGNOSIS — I10 Essential (primary) hypertension: Secondary | ICD-10-CM

## 2017-06-07 DIAGNOSIS — E039 Hypothyroidism, unspecified: Secondary | ICD-10-CM | POA: Diagnosis not present

## 2017-06-07 LAB — COMPREHENSIVE METABOLIC PANEL
ALT: 15 U/L (ref 0–53)
AST: 12 U/L (ref 0–37)
Albumin: 4.2 g/dL (ref 3.5–5.2)
Alkaline Phosphatase: 73 U/L (ref 39–117)
BILIRUBIN TOTAL: 0.7 mg/dL (ref 0.2–1.2)
BUN: 27 mg/dL — ABNORMAL HIGH (ref 6–23)
CALCIUM: 9.4 mg/dL (ref 8.4–10.5)
CHLORIDE: 104 meq/L (ref 96–112)
CO2: 28 meq/L (ref 19–32)
CREATININE: 1.17 mg/dL (ref 0.40–1.50)
GFR: 64.73 mL/min (ref >60.00)
GLUCOSE: 104 mg/dL — AB (ref 70–99)
Potassium: 4.6 mEq/L (ref 3.5–5.1)
Sodium: 140 mEq/L (ref 135–145)
Total Protein: 7.1 g/dL (ref 6.0–8.3)

## 2017-06-07 LAB — CBC
HCT: 42.7 % (ref 39.0–52.0)
Hemoglobin: 14.4 g/dL (ref 13.0–17.0)
MCHC: 33.8 g/dL (ref 30.0–36.0)
MCV: 93.6 fl (ref 78.0–100.0)
PLATELETS: 133 10*3/uL — AB (ref 150.0–400.0)
RBC: 4.56 Mil/uL (ref 4.22–5.81)
RDW: 13.1 % (ref 11.5–15.5)
WBC: 5 10*3/uL (ref 4.0–10.5)

## 2017-06-07 LAB — LIPID PANEL
CHOL/HDL RATIO: 2
Cholesterol: 140 mg/dL (ref 0–200)
HDL: 67.4 mg/dL (ref >39.00)
LDL CALC: 54 mg/dL (ref 0–99)
NONHDL: 72.86
TRIGLYCERIDES: 93 mg/dL (ref 0.0–149.0)
VLDL: 18.6 mg/dL (ref 0.0–40.0)

## 2017-06-07 LAB — TSH: TSH: 1.26 u[IU]/mL (ref 0.35–4.50)

## 2017-06-07 LAB — HEMOGLOBIN A1C: HEMOGLOBIN A1C: 6.2 % (ref 4.6–6.5)

## 2017-06-07 LAB — VITAMIN D 25 HYDROXY (VIT D DEFICIENCY, FRACTURES): VITD: 55.63 ng/mL (ref 30.00–100.00)

## 2017-06-07 NOTE — Assessment & Plan Note (Signed)
Well controlled, no changes to meds. Encouraged heart healthy diet such as the DASH diet and exercise as tolerated.  °

## 2017-06-07 NOTE — Assessment & Plan Note (Addendum)
Check level today, takes 50000 IU weekly and 1000 IU daily a

## 2017-06-07 NOTE — Patient Instructions (Signed)

## 2017-06-07 NOTE — Assessment & Plan Note (Signed)
Tolerating statin, encouraged heart healthy diet, avoid trans fats, minimize simple carbs and saturated fats. Increase exercise as tolerated 

## 2017-06-07 NOTE — Assessment & Plan Note (Signed)
On Levothyroxine, continue to monitor 

## 2017-06-07 NOTE — Progress Notes (Signed)
Subjective:  I acted as a Education administrator for Micheal Mcintyre. Micheal Mcintyre, [place]   Patient ID: Micheal Mcintyre, male    DOB: 1940/08/29, 77 y.o.   MRN: [id number]  Chief Complaint  Patient presents with  . Follow-up    HPI  Patient is in today for 6 month follow up visit. He is feeling well. No recent febrile illness or hospitalizations. His right shoulder pain is improving. No polyuria or polydipsia. Denies CP/palp/SOB/HA/congestion/fevers/GI or GU c/o. Taking meds as prescribed  Patient Care Team: [name], MD as PCP - General (Family Medicine)   Past Medical History:  Diagnosis Date  . Arthritis   . GERD (gastroesophageal reflux disease)   . Hypertension   . Medicare annual wellness visit, subsequent 08/19/2014  . Osteoarthritis 09/21/2007   Qualifier: Diagnosis of  By: Niel Hummer MD, [place] [facility] [facility] 06/07/2016  . Right shoulder pain 12/03/2016    Past Surgical History:  Procedure Laterality Date  . CHOLECYSTECTOMY    . HIP SURGERY    . JOINT REPLACEMENT    . TOTAL HIP ARTHROPLASTY      Family History  Problem Relation Age of Onset  . Heart attack Father 86       deceased  . Diabetes Maternal Uncle        maternal grandfather  . Hypertension Neg Hx   . Breast cancer Neg Hx   . Colon cancer Neg Hx   . Prostate cancer Neg Hx     Social History   Socioeconomic History  . Marital status: Married    Spouse name: Not on file  . Number of children: Not on file  . Years of education: Not on file  . Highest education level: Not on file  Social Needs  . Financial resource strain: Not on file  . Food insecurity - worry: Not on file  . Food insecurity - inability: Not on file  . Transportation needs - medical: Not on file  . Transportation needs - non-medical: Not on file  Occupational History  . Not on file  Tobacco Use  . Smoking status: Never Smoker  . Smokeless tobacco: Never Used  Substance and Sexual Activity  . Alcohol use: Yes    Comment: occ  .  Drug use: No  . Sexual activity: Yes  Other Topics Concern  . Not on file  Social History Narrative  . Not on file    Outpatient Medications Prior to Visit  Medication Sig Dispense Refill  . Krill Oil 300 MG CAPS Take 1 capsule by mouth daily.    . rosuvastatin (CRESTOR) 5 MG tablet TAKE 1 TABLET(5 MG) BY MOUTH DAILY 30 tablet 5  . sildenafil (VIAGRA) 50 MG tablet Take 1 tablet (50 mg total) by mouth daily as needed. 10 tablet 3  . telmisartan (MICARDIS) 80 MG tablet TAKE 1/2 TABLET( 40 MG) BY MOUTH TWICE DAILY 30 tablet 0  . Vitamin D, Ergocalciferol, (DRISDOL) 50000 units CAPS capsule TAKE 1 CAPSULE BY MOUTH EVERY 7 DAYS 4 capsule 0   No facility-administered medications prior to visit.     No Known Allergies  Review of Systems  Constitutional: Negative for fever and malaise/fatigue.  HENT: Negative for congestion.   Eyes: Negative for blurred vision.  Respiratory: Negative for shortness of breath.   Cardiovascular: Negative for chest pain, palpitations and leg swelling.  Gastrointestinal: Negative for abdominal pain, blood in stool and nausea.  Genitourinary: Negative for dysuria and frequency.  Musculoskeletal:  Positive for joint pain. Negative for falls.  Skin: Negative for rash.  Neurological: Negative for dizziness, loss of consciousness and headaches.  Endo/Heme/Allergies: Negative for environmental allergies.  Psychiatric/Behavioral: Negative for depression. The patient is not nervous/anxious.        Objective:    Physical Exam  Constitutional: He is oriented to person, place, and time. He appears well-developed and well-nourished. No distress.  HENT:  Head: Normocephalic and atraumatic.  Nose: Nose normal.  Eyes: Right eye exhibits no discharge. Left eye exhibits no discharge.  Neck: Normal range of motion. Neck supple.  Cardiovascular: Normal rate and regular rhythm.  No murmur heard. Pulmonary/Chest: Effort normal and breath sounds normal.  Abdominal:  Soft. Bowel sounds are normal. There is no tenderness.  Musculoskeletal: He exhibits no edema.  Neurological: He is alert and oriented to person, place, and time.  Skin: Skin is warm and dry.  Psychiatric: He has a normal mood and affect.  Nursing note and vitals reviewed.   BP 132/64 (BP Location: Left Arm, Patient Position: Sitting, Cuff Size: Normal)   Pulse 65   Temp 97.6 F (36.4 C) (Oral)   Resp 16   Ht 5' 8.9" (1.75 m)   Wt 165 lb (74.8 kg)   SpO2 98%   BMI 24.44 kg/m  Wt Readings from Last 3 Encounters:  06/07/17 165 lb (74.8 kg)  06/01/16 162 lb (73.5 kg)  05/21/15 162 lb (73.5 kg)   BP Readings from Last 3 Encounters:  06/07/17 132/64  06/01/16 139/72  05/21/15 (!) 142/78     Immunization History  Administered Date(s) Administered  . Influenza Whole 02/07/2008, 02/01/2009  . Influenza,inj,Quad PF,6+ Mos 01/24/2013, 01/01/2014  . Influenza-Unspecified 02/23/2015, 03/04/2016  . Pneumococcal Conjugate-13 05/24/2013  . Pneumococcal Polysaccharide-23 02/07/2008, 06/01/2016  . Td 09/28/2007  . Zoster 10/19/2007, 05/02/2009    Health Maintenance  Topic Date Due  . TETANUS/TDAP  09/27/2017  . COLONOSCOPY  04/16/2019  . INFLUENZA VACCINE  Completed  . PNA vac Low Risk Adult  Completed    Lab Results  Component Value Date   WBC 5.0 06/07/2017   HGB 14.4 06/07/2017   HCT 42.7 06/07/2017   PLT 133.0 (L) 06/07/2017   GLUCOSE 104 (H) 06/07/2017   CHOL 140 06/07/2017   TRIG 93.0 06/07/2017   HDL 67.40 06/07/2017   LDLCALC 54 06/07/2017   ALT 15 06/07/2017   AST 12 06/07/2017   NA 140 06/07/2017   K 4.6 06/07/2017   CL 104 06/07/2017   CREATININE 1.17 06/07/2017   BUN 27 (H) 06/07/2017   CO2 28 06/07/2017   TSH 1.26 06/07/2017   PSA 1.23 08/13/2014   HGBA1C 6.2 06/07/2017    Lab Results  Component Value Date   TSH 1.26 06/07/2017   Lab Results  Component Value Date   WBC 5.0 06/07/2017   HGB 14.4 06/07/2017   HCT 42.7 06/07/2017   MCV 93.6  06/07/2017   PLT 133.0 (L) 06/07/2017   Lab Results  Component Value Date   NA 140 06/07/2017   K 4.6 06/07/2017   CO2 28 06/07/2017   GLUCOSE 104 (H) 06/07/2017   BUN 27 (H) 06/07/2017   CREATININE 1.17 06/07/2017   BILITOT 0.7 06/07/2017   ALKPHOS 73 06/07/2017   AST 12 06/07/2017   ALT 15 06/07/2017   PROT 7.1 06/07/2017   ALBUMIN 4.2 06/07/2017   CALCIUM 9.4 06/07/2017   GFR 64.73 06/07/2017   Lab Results  Component Value Date   CHOL 140  06/07/2017   Lab Results  Component Value Date   HDL 67.40 06/07/2017   Lab Results  Component Value Date   LDLCALC 54 06/07/2017   Lab Results  Component Value Date   TRIG 93.0 06/07/2017   Lab Results  Component Value Date   CHOLHDL 2 06/07/2017   Lab Results  Component Value Date   HGBA1C 6.2 06/07/2017         Assessment & Plan:   Problem List Items Addressed This Visit    Hypothyroidism    On Levothyroxine, continue to monitor      Hyperlipidemia, mixed    Tolerating statin, encouraged heart healthy diet, avoid trans fats, minimize simple carbs and saturated fats. Increase exercise as tolerated      Relevant Orders   Lipid panel (Completed)   Essential hypertension    Well controlled, no changes to meds. Encouraged heart healthy diet such as the DASH diet and exercise as tolerated.       Relevant Orders   CBC (Completed)   Comprehensive metabolic panel (Completed)   TSH (Completed)   Hyperglycemia    hgba1c acceptable, minimize simple carbs. Increase exercise as tolerated. Continue current meds      Relevant Orders   Hemoglobin A1c (Completed)   Vitamin D deficiency    Check level today, takes 50000 IU weekly and 1000 IU daily a      Relevant Orders   VITAMIN D 25 Hydroxy (Vit-D Deficiency, Fractures) (Completed)   Right shoulder pain    Improved since last visit and he continues to stay active.          I am having Micheal Mcintyre maintain his Krill Oil, sildenafil, rosuvastatin, Vitamin D  (Ergocalciferol), and telmisartan.  No orders of the defined types were placed in this encounter.   CMA served as Education administrator during this visit. History, Physical and Plan performed by medical provider. Documentation and orders reviewed and attested to.  Penni Homans, MD

## 2017-06-07 NOTE — Assessment & Plan Note (Signed)
hgba1c acceptable, minimize simple carbs. Increase exercise as tolerated. Continue current meds 

## 2017-06-13 NOTE — Assessment & Plan Note (Signed)
Improved since last visit and he continues to stay active.

## 2017-07-05 ENCOUNTER — Other Ambulatory Visit: Payer: Self-pay | Admitting: Family Medicine

## 2017-07-14 ENCOUNTER — Other Ambulatory Visit: Payer: Self-pay | Admitting: Family Medicine

## 2017-07-15 NOTE — Telephone Encounter (Signed)
Vitamin d was normal 06/07/17.  Do you want to continue?

## 2017-08-10 ENCOUNTER — Other Ambulatory Visit: Payer: Self-pay | Admitting: Family Medicine

## 2017-08-16 ENCOUNTER — Other Ambulatory Visit: Payer: Self-pay | Admitting: Family Medicine

## 2017-09-09 ENCOUNTER — Other Ambulatory Visit: Payer: Self-pay | Admitting: Family Medicine

## 2017-09-13 ENCOUNTER — Other Ambulatory Visit: Payer: Self-pay | Admitting: Family Medicine

## 2017-10-09 ENCOUNTER — Other Ambulatory Visit: Payer: Self-pay | Admitting: Family Medicine

## 2017-10-13 ENCOUNTER — Other Ambulatory Visit: Payer: Self-pay | Admitting: Family Medicine

## 2017-11-14 ENCOUNTER — Other Ambulatory Visit: Payer: Self-pay | Admitting: Family Medicine

## 2017-12-13 ENCOUNTER — Other Ambulatory Visit: Payer: Self-pay | Admitting: Family Medicine

## 2017-12-17 ENCOUNTER — Encounter: Payer: Medicare Other | Admitting: Family Medicine

## 2017-12-20 NOTE — Progress Notes (Addendum)
Subjective:   Micheal Mcintyre is a 77 y.o. male who presents for Medicare Annual/Subsequent preventive examination.  Review of Systems: No ROS.  Medicare Wellness Visit. Additional risk factors are reflected in the social history. Cardiac Risk Factors include: advanced age (>66men, >38 women);dyslipidemia;hypertension;male gender Sleep patterns: Sleeps well. No issues.Naps as needed.  Home Safety/Smoke Alarms: Feels safe in home. Smoke alarms in place.  Living environment: Lives with wife in 2 story home. Eye- [facility]. Pt states he needs to make appt.  Male:   CCS- due 2020     PSA-  Lab Results  Component Value Date   PSA 1.23 08/13/2014   PSA 1.11 10/09/2011   PSA 0.91 09/16/2010       Objective:    Vitals: BP 123/78 (BP Location: Right Arm, Patient Position: Sitting, Cuff Size: Normal)   Pulse 71   Ht 5' 8.9" (1.75 m)   Wt 164 lb 6.4 oz (74.6 kg)   SpO2 97%   BMI 24.35 kg/m   Body mass index is 24.35 kg/m.  Advanced Directives 12/21/2017 06/01/2016 08/13/2014  Does Patient Have a Medical Advance Directive? No No No  Does patient want to make changes to medical advance directive? - Yes (MAU/Ambulatory/Procedural Areas - Information given) -  Would patient like information on creating a medical advance directive? Yes (MAU/Ambulatory/Procedural Areas - Information given) - Yes - Educational materials given    Tobacco Social History   Tobacco Use  Smoking Status Never Smoker  Smokeless Tobacco Never Used     Counseling given: Not Answered   Clinical Intake: Pain : No/denies pain    Past Medical History:  Diagnosis Date  . Arthritis   . GERD (gastroesophageal reflux disease)   . Hypertension   . Medicare annual wellness visit, subsequent 08/19/2014  . Osteoarthritis 09/21/2007   Qualifier: Diagnosis of  By: Micheal Hummer MD, [place] [facility] 06/07/2016  . Right shoulder pain 12/03/2016   Past Surgical History:  Procedure Laterality Date  .  CHOLECYSTECTOMY    . HIP SURGERY    . JOINT REPLACEMENT    . TOTAL HIP ARTHROPLASTY     Family History  Problem Relation Age of Onset  . Heart attack Father 19       deceased  . Diabetes Maternal Uncle        maternal grandfather  . Hypertension Neg Hx   . Breast cancer Neg Hx   . Colon cancer Neg Hx   . Prostate cancer Neg Hx    Social History   Socioeconomic History  . Marital status: Married    Spouse name: Not on file  . Number of children: Not on file  . Years of education: Not on file  . Highest education level: Not on file  Occupational History  . Not on file  Social Needs  . Financial resource strain: Not on file  . Food insecurity:    Worry: Not on file    Inability: Not on file  . Transportation needs:    Medical: Not on file    Non-medical: Not on file  Tobacco Use  . Smoking status: Never Smoker  . Smokeless tobacco: Never Used  Substance and Sexual Activity  . Alcohol use: Yes    Comment: occ  . Drug use: No  . Sexual activity: Not Currently  Lifestyle  . Physical activity:    Days per week: Not on file    Minutes per session: Not on file  .  Stress: Not on file  Relationships  . Social connections:    Talks on phone: Not on file    Gets together: Not on file    Attends religious service: Not on file    Active member of club or organization: Not on file    Attends meetings of clubs or organizations: Not on file    Relationship status: Not on file  Other Topics Concern  . Not on file  Social History Narrative  . Not on file    Outpatient Encounter Medications as of 12/21/2017  Medication Sig  . [name] 300 MG CAPS Take 1 capsule by mouth daily.  . rosuvastatin (CRESTOR) 5 MG tablet Take 1 tablet (5 mg total) by mouth daily.  . sildenafil (VIAGRA) 50 MG tablet Take 1 tablet (50 mg total) by mouth daily as needed.  Marland Kitchen telmisartan (MICARDIS) 80 MG tablet Take 0.5 tablets (40 mg total) by mouth 2 (two) times daily.  . Vitamin D,  Ergocalciferol, (DRISDOL) 50000 units CAPS capsule TAKE 1 CAPSULE BY MOUTH EVERY 7 DAYS (Patient not taking: Reported on 12/21/2017)   No facility-administered encounter medications on file as of 12/21/2017.     Activities of Daily Living In your present state of health, do you have any difficulty performing the following activities: 12/21/2017  Hearing? N  Vision? N  Difficulty concentrating or making decisions? N  Walking or climbing stairs? N  Dressing or bathing? N  Doing errands, shopping? N  Preparing Food and eating ? N  Using the Toilet? N  In the past six months, have you accidently leaked urine? N  Do you have problems with loss of bowel control? N  Managing your Medications? N  Managing your Finances? N  Housekeeping or managing your Housekeeping? N  Some recent data might be hidden    Patient Care Team: [name], MD as PCP - General (Family Medicine)   Assessment:   This is a routine wellness examination for [facility]. Physical assessment deferred to PCP.  Exercise Activities and Dietary recommendations Exercise limited by: None identified Diet (meal preparation, eat out, water intake, caffeinated beverages, dairy products, fruits and vegetables): well balanced Breakfast: Fiber 1 cereal Lunch: [place] and cheese sandwich Dinner:    Grilled veggies  Goals    . Weight (lb) < 155 lb (70.3 kg) (pt-stated)     Lose 10 pounds       Fall Risk Fall Risk  12/21/2017 12/03/2016 06/01/2016 08/13/2014  Falls in the past year? No No Yes No  Number falls in past yr: - - 2 or more -  Comment - - tripped on things on the floor -  Injury with Fall? - - No -  Risk for fall due to : - - Impaired balance/gait;Impaired mobility -  Follow up - - Falls prevention discussed -    Depression Screen PHQ 2/9 Scores 12/21/2017 12/03/2016 06/01/2016 08/13/2014  PHQ - 2 Score 0 0 0 0    Cognitive Function Ad8 score reviewed for issues:  Issues making decisions:no  Less interest in  hobbies / activities:no  Repeats questions, stories (family complaining):no  Trouble using ordinary gadgets (microwave, computer, phone):no  Forgets the month or year: no  Mismanaging finances: no  Remembering appts:no  Daily problems with thinking and/or memory:no Ad8 score is=0     MMSE - Mini Mental State Exam 06/01/2016  Orientation to time 5  Orientation to Place 5  Registration 3  Attention/ Calculation 5  Recall 3  Language- name 2 objects 2  Language- repeat 1  Language- follow 3 step command 3  Language- read & follow direction 1  Write a sentence 1  Copy design 1  Total score 30        Immunization History  Administered Date(s) Administered  . Influenza Whole 02/07/2008, 02/01/2009  . Influenza,inj,Quad PF,6+ Mos 01/24/2013, 01/01/2014  . Influenza-Unspecified 02/23/2015, 03/04/2016  . Pneumococcal Conjugate-13 05/24/2013  . Pneumococcal Polysaccharide-23 02/07/2008, 06/01/2016  . Td 09/28/2007  . Zoster 10/19/2007, 05/02/2009    Screening Tests Health Maintenance  Topic Date Due  . TETANUS/TDAP  09/27/2017  . INFLUENZA VACCINE  12/02/2017  . COLONOSCOPY  04/16/2019  . PNA vac Low Risk Adult  Completed         Plan:    Please schedule your next medicare wellness visit with me in 1 yr.  Continue to eat heart healthy diet (full of fruits, vegetables, whole grains, lean protein, water--limit salt, fat, and sugar intake) and increase physical activity as tolerated.  Bring a copy of your living will and/or healthcare power of attorney to your next office visit.  Per Dr.[name]--make sure to take Vitamin D 2000 units daily and she will reevaluate at your appointment 01/07/18. She also wants you to make sure that you are drinking 60-80 ounces of water daily and more if needed from working outside.   I have personally reviewed and noted the following in the patient's chart:   . Medical and social history . Use of alcohol, tobacco or illicit drugs   . Current medications and supplements . Functional ability and status . Nutritional status . Physical activity . Advanced directives . List of other physicians . Hospitalizations, surgeries, and ER visits in previous 12 months . Vitals . Screenings to include cognitive, depression, and falls . Referrals and appointments  In addition, I have reviewed and discussed with patient certain preventive protocols, quality metrics, and best practice recommendations. A written personalized care plan for preventive services as well as general preventive health recommendations were provided to patient.     Shela Nevin, [place]  12/21/2017  Medical screening examination/treatment was performed by qualified clinical staff member and as supervising physician I was immediately available for consultation/collaboration. I have reviewed documentation and agree with assessment and plan.  Penni Homans, MD

## 2017-12-21 ENCOUNTER — Encounter: Payer: Self-pay | Admitting: *Deleted

## 2017-12-21 ENCOUNTER — Ambulatory Visit (INDEPENDENT_AMBULATORY_CARE_PROVIDER_SITE_OTHER): Payer: Medicare Other | Admitting: *Deleted

## 2017-12-21 VITALS — BP 123/78 | HR 71 | Ht 68.9 in | Wt 164.4 lb

## 2017-12-21 DIAGNOSIS — Z0000 Encounter for general adult medical examination without abnormal findings: Secondary | ICD-10-CM | POA: Diagnosis not present

## 2017-12-21 NOTE — Patient Instructions (Addendum)
Please schedule your next medicare wellness visit with me in 1 yr.  Continue to eat heart healthy diet (full of fruits, vegetables, whole grains, lean protein, water--limit salt, fat, and sugar intake) and increase physical activity as tolerated.  Bring a copy of your living will and/or healthcare power of attorney to your next office visit.  Per Dr.[name]--make sure to take Vitamin D 2000 units daily and she will reevaluate at your appointment 01/07/18. She also wants you to make sure that you are drinking 60-80 ounces of water daily and more if needed from working outside.     Micheal Mcintyre , Thank you for taking time to come for your Medicare Wellness Visit. I appreciate your ongoing commitment to your health goals. Please review the following plan we discussed and let me know if I can assist you in the future.   These are the goals we discussed: Goals    . DIET - INCREASE WATER INTAKE       This is a list of the screening recommended for you and due dates:  Health Maintenance  Topic Date Due  . Tetanus Vaccine  09/27/2017  . Flu Shot  12/02/2017  . Colon Cancer Screening  04/16/2019  . Pneumonia vaccines  Completed    Health Maintenance, Male A healthy lifestyle and preventive care is important for your health and wellness. Ask your health care provider about what schedule of regular examinations is right for you. What should I know about weight and diet? Eat a Healthy Diet  Eat plenty of vegetables, fruits, whole grains, low-fat dairy products, and lean protein.  Do not eat a lot of foods high in solid fats, added sugars, or salt.  Maintain a Healthy Weight Regular exercise can help you achieve or maintain a healthy weight. You should:  Do at least 150 minutes of exercise each week. The exercise should increase your heart rate and make you sweat (moderate-intensity exercise).  Do strength-training exercises at least twice a week.  Watch Your Levels of Cholesterol and Blood  Lipids  Have your blood tested for lipids and cholesterol every 5 years starting at 75 years of age. If you are at high risk for heart disease, you should start having your blood tested when you are 75 years old. You may need to have your cholesterol levels checked more often if: ? Your lipid or cholesterol levels are high. ? You are older than 75 years of age. ? You are at high risk for heart disease.  What should I know about cancer screening? Many types of cancers can be detected early and may often be prevented. Lung Cancer  You should be screened every year for lung cancer if: ? You are a current smoker who has smoked for at least 30 years. ? You are a former smoker who has quit within the past 15 years.  Talk to your health care provider about your screening options, when you should start screening, and how often you should be screened.  Colorectal Cancer  Routine colorectal cancer screening usually begins at 75 years of age and should be repeated every 5-10 years until you are 75 years old. You may need to be screened more often if early forms of precancerous polyps or small growths are found. Your health care provider may recommend screening at an earlier age if you have risk factors for colon cancer.  Your health care provider may recommend using home test kits to check for hidden blood in the stool.  A small camera at the end of a tube can be used to examine your colon (sigmoidoscopy or colonoscopy). This checks for the earliest forms of colorectal cancer.  Prostate and Testicular Cancer  Depending on your age and overall health, your health care provider may do certain tests to screen for prostate and testicular cancer.  Talk to your health care provider about any symptoms or concerns you have about testicular or prostate cancer.  Skin Cancer  Check your skin from head to toe regularly.  Tell your health care provider about any new moles or changes in moles, especially  if: ? There is a change in a mole's size, shape, or color. ? You have a mole that is larger than a pencil eraser.  Always use sunscreen. Apply sunscreen liberally and repeat throughout the day.  Protect yourself by wearing long sleeves, pants, a wide-brimmed hat, and sunglasses when outside.  What should I know about heart disease, diabetes, and high blood pressure?  If you are 91-30 years of age, have your blood pressure checked every 3-5 years. If you are 33 years of age or older, have your blood pressure checked every year. You should have your blood pressure measured twice-once when you are at a hospital or clinic, and once when you are not at a hospital or clinic. Record the average of the two measurements. To check your blood pressure when you are not at a hospital or clinic, you can use: ? An automated blood pressure machine at a pharmacy. ? A home blood pressure monitor.  Talk to your health care provider about your target blood pressure.  If you are between 57-33 years old, ask your health care provider if you should take aspirin to prevent heart disease.  Have regular diabetes screenings by checking your fasting blood sugar level. ? If you are at a normal weight and have a low risk for diabetes, have this test once every three years after the age of 66. ? If you are overweight and have a high risk for diabetes, consider being tested at a younger age or more often.  A one-time screening for abdominal aortic aneurysm (AAA) by ultrasound is recommended for men aged 17-75 years who are current or former smokers. What should I know about preventing infection? Hepatitis B If you have a higher risk for hepatitis B, you should be screened for this virus. Talk with your health care provider to find out if you are at risk for hepatitis B infection. Hepatitis C Blood testing is recommended for:  Everyone born from [birth year] through 1963.  Anyone with known risk factors for hepatitis  C.  Sexually Transmitted Diseases (STDs)  You should be screened each year for STDs including gonorrhea and chlamydia if: ? You are sexually active and are younger than 75 years of age. ? You are older than 75 years of age and your health care provider tells you that you are at risk for this type of infection. ? Your sexual activity has changed since you were last screened and you are at an increased risk for chlamydia or gonorrhea. Ask your health care provider if you are at risk.  Talk with your health care provider about whether you are at high risk of being infected with HIV. Your health care provider may recommend a prescription medicine to help prevent HIV infection.  What else can I do?  Schedule regular health, dental, and eye exams.  Stay current with your vaccines (immunizations).  Do not use  any tobacco products, such as cigarettes, chewing tobacco, and e-cigarettes. If you need help quitting, ask your health care provider.  Limit alcohol intake to no more than 2 drinks per day. One drink equals 12 ounces of beer, 5 ounces of wine, or 1 ounces of hard liquor.  Do not use street drugs.  Do not share needles.  Ask your health care provider for help if you need support or information about quitting drugs.  Tell your health care provider if you often feel depressed.  Tell your health care provider if you have ever been abused or do not feel safe at home. This information is not intended to replace advice given to you by your health care provider. Make sure you discuss any questions you have with your health care provider. Document Released: 10/17/2007 Document Revised: 12/18/2015 Document Reviewed: 01/22/2015 Elsevier Interactive Patient Education  2018 [employer].  Dehydration, Adult Dehydration is when there is not enough fluid or water in your body. This happens when you lose more fluids than you take in. Dehydration can range from mild to very bad. It should be  treated right away to keep it from getting very bad. Symptoms of mild dehydration may include:  Thirst.  Dry lips.  Slightly dry mouth.  Dry, warm skin.  Dizziness. Symptoms of moderate dehydration may include:  Very dry mouth.  Muscle cramps.  Dark pee (urine). Pee may be the color of tea.  Your body making less pee.  Your eyes making fewer tears.  Heartbeat that is uneven or faster than normal (palpitations).  Headache.  Light-headedness, especially when you stand up from sitting.  Fainting (syncope). Symptoms of very bad dehydration may include:  Changes in skin, such as: ? Cold and clammy skin. ? Blotchy (mottled) or pale skin. ? Skin that does not quickly return to normal after being lightly pinched and let go (poor skin turgor).  Changes in body fluids, such as: ? Feeling very thirsty. ? Your eyes making fewer tears. ? Not sweating when body temperature is high, such as in hot weather. ? Your body making very little pee.  Changes in vital signs, such as: ? Weak pulse. ? Pulse that is more than 100 beats a minute when you are sitting still. ? Fast breathing. ? Low blood pressure.  Other changes, such as: ? Sunken eyes. ? Cold hands and feet. ? Confusion. ? Lack of energy (lethargy). ? Trouble waking up from sleep. ? Short-term weight loss. ? Unconsciousness. Follow these instructions at home:  If told by your doctor, drink an ORS: ? Make an ORS by using instructions on the package. ? Start by drinking small amounts, about  cup (120 mL) every 5-10 minutes. ? Slowly drink more until you have had the amount that your doctor said to have.  Drink enough clear fluid to keep your pee clear or pale yellow. If you were told to drink an ORS, finish the ORS first, then start slowly drinking clear fluids. Drink fluids such as: ? Water. Do not drink only water by itself. Doing that can make the salt (sodium) level in your body get too low  (hyponatremia). ? Ice chips. ? Fruit juice that you have added water to (diluted). ? Low-calorie sports drinks.  Avoid: ? Alcohol. ? Drinks that have a lot of sugar. These include high-calorie sports drinks, fruit juice that does not have water added, and soda. ? Caffeine. ? Foods that are greasy or have a lot of fat or sugar.  Take over-the-counter and prescription medicines only as told by your doctor.  Do not take salt tablets. Doing that can make the salt level in your body get too high (hypernatremia).  Eat foods that have minerals (electrolytes). Examples include bananas, oranges, potatoes, tomatoes, and spinach.  Keep all follow-up visits as told by your doctor. This is important. Contact a doctor if:  You have belly (abdominal) pain that: ? Gets worse. ? Stays in one area (localizes).  You have a rash.  You have a stiff neck.  You get angry or annoyed more easily than normal (irritability).  You are more sleepy than normal.  You have a harder time waking up than normal.  You feel: ? Weak. ? Dizzy. ? Very thirsty.  You have peed (urinated) only a small amount of very dark pee during 6-8 hours. Get help right away if:  You have symptoms of very bad dehydration.  You cannot drink fluids without throwing up (vomiting).  Your symptoms get worse with treatment.  You have a fever.  You have a very bad headache.  You are throwing up or having watery poop (diarrhea) and it: ? Gets worse. ? Does not go away.  You have blood or something green (bile) in your throw-up.  You have blood in your poop (stool). This may cause poop to look black and tarry.  You have not peed in 6-8 hours.  You pass out (faint).  Your heart rate when you are sitting still is more than 100 beats a minute.  You have trouble breathing. This information is not intended to replace advice given to you by your health care provider. Make sure you discuss any questions you have with  your health care provider. Document Released: 02/14/2009 Document Revised: 11/08/2015 Document Reviewed: 06/14/2015 Elsevier Interactive Patient Education  2018 [employer].

## 2018-01-07 ENCOUNTER — Ambulatory Visit (INDEPENDENT_AMBULATORY_CARE_PROVIDER_SITE_OTHER): Payer: Medicare Other | Admitting: Family Medicine

## 2018-01-07 ENCOUNTER — Encounter: Payer: Self-pay | Admitting: Family Medicine

## 2018-01-07 VITALS — BP 126/60 | HR 58 | Temp 97.6°F | Resp 18 | Ht 69.0 in | Wt 165.4 lb

## 2018-01-07 DIAGNOSIS — E559 Vitamin D deficiency, unspecified: Secondary | ICD-10-CM

## 2018-01-07 DIAGNOSIS — I10 Essential (primary) hypertension: Secondary | ICD-10-CM | POA: Diagnosis not present

## 2018-01-07 DIAGNOSIS — R739 Hyperglycemia, unspecified: Secondary | ICD-10-CM

## 2018-01-07 DIAGNOSIS — E782 Mixed hyperlipidemia: Secondary | ICD-10-CM | POA: Diagnosis not present

## 2018-01-07 DIAGNOSIS — E039 Hypothyroidism, unspecified: Secondary | ICD-10-CM

## 2018-01-07 DIAGNOSIS — Z23 Encounter for immunization: Secondary | ICD-10-CM

## 2018-01-07 DIAGNOSIS — Z0000 Encounter for general adult medical examination without abnormal findings: Secondary | ICD-10-CM

## 2018-01-07 LAB — CBC
HEMATOCRIT: 40.9 % (ref 39.0–52.0)
HEMOGLOBIN: 14 g/dL (ref 13.0–17.0)
MCHC: 34.2 g/dL (ref 30.0–36.0)
MCV: 92.9 fl (ref 78.0–100.0)
Platelets: 125 10*3/uL — ABNORMAL LOW (ref 150.0–400.0)
RBC: 4.4 Mil/uL (ref 4.22–5.81)
RDW: 12.6 % (ref 11.5–15.5)
WBC: 4.6 10*3/uL (ref 4.0–10.5)

## 2018-01-07 LAB — COMPREHENSIVE METABOLIC PANEL
ALT: 16 U/L (ref 0–53)
AST: 12 U/L (ref 0–37)
Albumin: 4.2 g/dL (ref 3.5–5.2)
Alkaline Phosphatase: 79 U/L (ref 39–117)
BUN: 19 mg/dL (ref 6–23)
CHLORIDE: 106 meq/L (ref 96–112)
CO2: 29 meq/L (ref 19–32)
CREATININE: 1.07 mg/dL (ref 0.40–1.50)
Calcium: 9.2 mg/dL (ref 8.4–10.5)
GFR: 71.65 mL/min (ref >60.00)
Glucose, Bld: 101 mg/dL — ABNORMAL HIGH (ref 70–99)
POTASSIUM: 4.7 meq/L (ref 3.5–5.1)
SODIUM: 141 meq/L (ref 135–145)
Total Bilirubin: 0.8 mg/dL (ref 0.2–1.2)
Total Protein: 6.6 g/dL (ref 6.0–8.3)

## 2018-01-07 LAB — LIPID PANEL
CHOL/HDL RATIO: 2
Cholesterol: 120 mg/dL (ref 0–200)
HDL: 55.4 mg/dL (ref >39.00)
LDL CALC: 50 mg/dL (ref 0–99)
NonHDL: 64.86
Triglycerides: 75 mg/dL (ref 0.0–149.0)
VLDL: 15 mg/dL (ref 0.0–40.0)

## 2018-01-07 LAB — TSH: TSH: 1.6 u[IU]/mL (ref 0.35–4.50)

## 2018-01-07 LAB — HEMOGLOBIN A1C: HEMOGLOBIN A1C: 6.1 % (ref 4.6–6.5)

## 2018-01-07 NOTE — Assessment & Plan Note (Signed)
Encouraged heart healthy diet, increase exercise, avoid trans fats, consider a krill oil cap daily 

## 2018-01-07 NOTE — Patient Instructions (Addendum)
Shingrix is the new shingles shot, 2 shots over 2-6 months at pharmacy Preventive Care 65 Years and Older, Male Preventive care refers to lifestyle choices and visits with your health care provider that can promote health and wellness. What does preventive care include?  A yearly physical exam. This is also called an annual well check.  Dental exams once or twice a year.  Routine eye exams. Ask your health care provider how often you should have your eyes checked.  Personal lifestyle choices, including: ? Daily care of your teeth and gums. ? Regular physical activity. ? Eating a healthy diet. ? Avoiding tobacco and drug use. ? Limiting alcohol use. ? Practicing safe sex. ? Taking low doses of aspirin every day. ? Taking vitamin and mineral supplements as recommended by your health care provider. What happens during an annual well check? The services and screenings done by your health care provider during your annual well check will depend on your age, overall health, lifestyle risk factors, and family history of disease. Counseling Your health care provider may ask you questions about your:  Alcohol use.  Tobacco use.  Drug use.  Emotional well-being.  Home and relationship well-being.  Sexual activity.  Eating habits.  History of falls.  Memory and ability to understand (cognition).  Work and work environment.  Screening You may have the following tests or measurements:  Height, weight, and BMI.  Blood pressure.  Lipid and cholesterol levels. These may be checked every 5 years, or more frequently if you are over 50 years old.  Skin check.  Lung cancer screening. You may have this screening every year starting at age 55 if you have a 30-pack-year history of smoking and currently smoke or have quit within the past 15 years.  Fecal occult blood test (FOBT) of the stool. You may have this test every year starting at age 50.  Flexible sigmoidoscopy or  colonoscopy. You may have a sigmoidoscopy every 5 years or a colonoscopy every 10 years starting at age 50.  Prostate cancer screening. Recommendations will vary depending on your family history and other risks.  Hepatitis C blood test.  Hepatitis B blood test.  Sexually transmitted disease (STD) testing.  Diabetes screening. This is done by checking your blood sugar (glucose) after you have not eaten for a while (fasting). You may have this done every 1-3 years.  Abdominal aortic aneurysm (AAA) screening. You may need this if you are a current or former smoker.  Osteoporosis. You may be screened starting at age 70 if you are at high risk.  Talk with your health care provider about your test results, treatment options, and if necessary, the need for more tests. Vaccines Your health care provider may recommend certain vaccines, such as:  Influenza vaccine. This is recommended every year.  Tetanus, diphtheria, and acellular pertussis (Tdap, Td) vaccine. You may need a Td booster every 10 years.  Varicella vaccine. You may need this if you have not been vaccinated.  Zoster vaccine. You may need this after age 60.  Measles, mumps, and rubella (MMR) vaccine. You may need at least one dose of MMR if you were born in 1957 or later. You may also need a second dose.  Pneumococcal 13-valent conjugate (PCV13) vaccine. One dose is recommended after age 75.  Pneumococcal polysaccharide (PPSV23) vaccine. One dose is recommended after age 75.  Meningococcal vaccine. You may need this if you have certain conditions.  Hepatitis A vaccine. You may need this if you   you have certain conditions or if you travel or work in places where you may be exposed to hepatitis A.  Hepatitis B vaccine. You may need this if you have certain conditions or if you travel or work in places where you may be exposed to hepatitis B.  Haemophilus influenzae type b (Hib) vaccine. You may need this if you have certain risk  factors.  Talk to your health care provider about which screenings and vaccines you need and how often you need them. This information is not intended to replace advice given to you by your health care provider. Make sure you discuss any questions you have with your health care provider. Document Released: 05/17/2015 Document Revised: 01/08/2016 Document Reviewed: 02/19/2015 Elsevier Interactive Patient Education  [employer].

## 2018-01-07 NOTE — Assessment & Plan Note (Signed)
On Levothyroxine, continue to monitor 

## 2018-01-07 NOTE — Assessment & Plan Note (Signed)
Patient encouraged to maintain heart healthy diet, regular exercise, adequate sleep. Consider daily probiotics. Take medications as prescribed. Labs reviewed 

## 2018-01-07 NOTE — Assessment & Plan Note (Signed)
hgba1c acceptable, minimize simple carbs. Increase exercise as tolerated. Continue current meds 

## 2018-01-07 NOTE — Assessment & Plan Note (Signed)
Supplement and monitor 

## 2018-01-07 NOTE — Progress Notes (Signed)
Subjective:  I acted as a Education administrator for Dr. Charlett Blake. [place], [place]  Patient ID: Micheal Mcintyre, male    DOB: [birth year], 77 y.o.   MRN: [id number]  Chief Complaint  Patient presents with  . Annual Exam    HPI  Patient is in today for an annual exam. He is following up on his HTN, hyperlipidemia and other medical concerns. He is doing well and has no acute concerns. No recent febrile illness or acute hospitalizations. Denies CP/palp/SOB/HA/congestion/fevers/GI or GU c/o. Taking meds as prescribed. He is staying active despite his left leg pain and debility. He works in the garden. He eats a heart healthy diet. He manages his activities of daily living.    Patient Care Team: [name], MD as PCP - General (Family Medicine)   Past Medical History:  Diagnosis Date  . Arthritis   . GERD (gastroesophageal reflux disease)   . Hypertension   . Medicare annual wellness visit, subsequent 08/19/2014  . Osteoarthritis 09/21/2007   Qualifier: Diagnosis of  By: Niel Hummer MD, [place] [facility] health care 06/07/2016  . Right shoulder pain 12/03/2016    Past Surgical History:  Procedure Laterality Date  . CHOLECYSTECTOMY    . HIP SURGERY    . JOINT REPLACEMENT    . TOTAL HIP ARTHROPLASTY      Family History  Problem Relation Age of Onset  . Heart attack Father 25       deceased  . Diabetes Maternal Uncle        maternal grandfather  . Hypertension Neg Hx   . Breast cancer Neg Hx   . Colon cancer Neg Hx   . Prostate cancer Neg Hx     Social History   Socioeconomic History  . Marital status: Married    Spouse name: Not on file  . Number of children: Not on file  . Years of education: Not on file  . Highest education level: Not on file  Occupational History  . Not on file  Social Needs  . Financial resource strain: Not on file  . Food insecurity:    Worry: Not on file    Inability: Not on file  . Transportation needs:    Medical: Not on file    Non-medical: Not on  file  Tobacco Use  . Smoking status: Never Smoker  . Smokeless tobacco: Never Used  Substance and Sexual Activity  . Alcohol use: Yes    Comment: occ  . Drug use: No  . Sexual activity: Not Currently  Lifestyle  . Physical activity:    Days per week: Not on file    Minutes per session: Not on file  . Stress: Not on file  Relationships  . Social connections:    Talks on phone: Not on file    Gets together: Not on file    Attends religious service: Not on file    Active member of club or organization: Not on file    Attends meetings of clubs or organizations: Not on file    Relationship status: Not on file  . Intimate partner violence:    Fear of current or ex partner: Not on file    Emotionally abused: Not on file    Physically abused: Not on file    Forced sexual activity: Not on file  Other Topics Concern  . Not on file  Social History Narrative  . Not on file    Outpatient Medications Prior to  Visit  Medication Sig Dispense Refill  . Krill Oil 300 MG CAPS Take 1 capsule by mouth daily.    . rosuvastatin (CRESTOR) 5 MG tablet Take 1 tablet (5 mg total) by mouth daily. 30 tablet 1  . sildenafil (VIAGRA) 50 MG tablet Take 1 tablet (50 mg total) by mouth daily as needed. 10 tablet 3  . telmisartan (MICARDIS) 80 MG tablet Take 0.5 tablets (40 mg total) by mouth 2 (two) times daily. 60 tablet 1  . Vitamin D, Ergocalciferol, (DRISDOL) 50000 units CAPS capsule TAKE 1 CAPSULE BY MOUTH EVERY 7 DAYS (Patient not taking: Reported on 12/21/2017) 4 capsule 0   No facility-administered medications prior to visit.     No Known Allergies  Review of Systems  Constitutional: Negative for chills, fever and malaise/fatigue.  HENT: Negative for congestion and hearing loss.   Eyes: Negative for discharge.  Respiratory: Negative for cough, sputum production and shortness of breath.   Cardiovascular: Negative for chest pain, palpitations and leg swelling.  Gastrointestinal: Negative for  abdominal pain, blood in stool, constipation, diarrhea, heartburn, nausea and vomiting.  Genitourinary: Negative for dysuria, frequency, hematuria and urgency.  Musculoskeletal: Negative for back pain, falls and myalgias.  Skin: Negative for rash.  Neurological: Negative for dizziness, sensory change, loss of consciousness, weakness and headaches.  Endo/Heme/Allergies: Negative for environmental allergies. Does not bruise/bleed easily.  Psychiatric/Behavioral: Negative for depression and suicidal ideas. The patient is not nervous/anxious and does not have insomnia.        Objective:    Physical Exam  Constitutional: He is oriented to person, place, and time. He appears well-developed and well-nourished. No distress.  HENT:  Head: Normocephalic and atraumatic.  Nose: Nose normal.  Eyes: Right eye exhibits no discharge. Left eye exhibits no discharge.  Neck: Normal range of motion. Neck supple. No thyromegaly present.  Cardiovascular: Normal rate, regular rhythm and normal heart sounds.  No murmur heard. Pulmonary/Chest: Effort normal and breath sounds normal. No respiratory distress. He has no wheezes.  Abdominal: Soft. Bowel sounds are normal. He exhibits no mass. There is no tenderness. There is no rebound and no guarding. No hernia.  Musculoskeletal: He exhibits no edema.  Lymphadenopathy:    He has no cervical adenopathy.  Neurological: He is alert and oriented to person, place, and time.  Skin: Skin is warm and dry.  Psychiatric: He has a normal mood and affect.  Nursing note and vitals reviewed.   BP 126/60 (BP Location: Left Arm, Patient Position: Sitting, Cuff Size: Normal)   Pulse (!) 58   Temp 97.6 F (36.4 C) (Oral)   Resp 18   Ht 5\' 9"  (1.753 m)   Wt 165 lb 6.4 oz (75 kg)   SpO2 98%   BMI 24.43 kg/m  Wt Readings from Last 3 Encounters:  01/07/18 165 lb 6.4 oz (75 kg)  12/21/17 164 lb 6.4 oz (74.6 kg)  06/07/17 165 lb (74.8 kg)   BP Readings from Last 3  Encounters:  01/07/18 126/60  12/21/17 123/78  06/07/17 132/64     Immunization History  Administered Date(s) Administered  . Influenza Whole 02/07/2008, 02/01/2009  . Influenza, High Dose Seasonal PF 01/07/2018  . Influenza,inj,Quad PF,6+ Mos 01/24/2013, 01/01/2014  . Influenza-Unspecified 02/23/2015, 03/04/2016  . Pneumococcal Conjugate-13 05/24/2013  . Pneumococcal Polysaccharide-23 02/07/2008, 06/01/2016  . Td 09/28/2007  . Zoster 10/19/2007, 05/02/2009    Health Maintenance  Topic Date Due  . TETANUS/TDAP  09/27/2017  . INFLUENZA VACCINE  12/02/2017  .  COLONOSCOPY  04/16/2019  . PNA vac Low Risk Adult  Completed    Lab Results  Component Value Date   WBC 5.0 06/07/2017   HGB 14.4 06/07/2017   HCT 42.7 06/07/2017   PLT 133.0 (L) 06/07/2017   GLUCOSE 104 (H) 06/07/2017   CHOL 140 06/07/2017   TRIG 93.0 06/07/2017   HDL 67.40 06/07/2017   LDLCALC 54 06/07/2017   ALT 15 06/07/2017   AST 12 06/07/2017   NA 140 06/07/2017   K 4.6 06/07/2017   CL 104 06/07/2017   CREATININE 1.17 06/07/2017   BUN 27 (H) 06/07/2017   CO2 28 06/07/2017   TSH 1.26 06/07/2017   PSA 1.23 08/13/2014   HGBA1C 6.2 06/07/2017    Lab Results  Component Value Date   TSH 1.26 06/07/2017   Lab Results  Component Value Date   WBC 5.0 06/07/2017   HGB 14.4 06/07/2017   HCT 42.7 06/07/2017   MCV 93.6 06/07/2017   PLT 133.0 (L) 06/07/2017   Lab Results  Component Value Date   NA 140 06/07/2017   K 4.6 06/07/2017   CO2 28 06/07/2017   GLUCOSE 104 (H) 06/07/2017   BUN 27 (H) 06/07/2017   CREATININE 1.17 06/07/2017   BILITOT 0.7 06/07/2017   ALKPHOS 73 06/07/2017   AST 12 06/07/2017   ALT 15 06/07/2017   PROT 7.1 06/07/2017   ALBUMIN 4.2 06/07/2017   CALCIUM 9.4 06/07/2017   GFR 64.73 06/07/2017   Lab Results  Component Value Date   CHOL 140 06/07/2017   Lab Results  Component Value Date   HDL 67.40 06/07/2017   Lab Results  Component Value Date   LDLCALC 54 06/07/2017    Lab Results  Component Value Date   TRIG 93.0 06/07/2017   Lab Results  Component Value Date   CHOLHDL 2 06/07/2017   Lab Results  Component Value Date   HGBA1C 6.2 06/07/2017         Assessment & Plan:   Problem List Items Addressed This Visit    Hypothyroidism    On Levothyroxine, continue to monitor      Relevant Orders   TSH   Hyperlipidemia, mixed    Encouraged heart healthy diet, increase exercise, avoid trans fats, consider a krill oil cap daily      Relevant Orders   Lipid panel   Essential hypertension    Well controlled, no changes to meds. Encouraged heart healthy diet such as the DASH diet and exercise as tolerated.       Relevant Orders   CBC   Comprehensive metabolic panel   TSH   Hyperglycemia    hgba1c acceptable, minimize simple carbs. Increase exercise as tolerated. Continue current meds      Relevant Orders   Hemoglobin A1c   Vitamin D deficiency    Supplement and monitor      Relevant Orders   Vitamin D 1,25 dihydroxy   Preventative health care    Patient encouraged to maintain heart healthy diet, regular exercise, adequate sleep. Consider daily probiotics. Take medications as prescribed. Labs reviewed       Other Visit Diagnoses    Needs flu shot    -  Primary   Relevant Orders   Flu vaccine HIGH DOSE PF (Fluzone High dose) (Completed)      I have discontinued Nhat C. Pfiffner's Vitamin D (Ergocalciferol). I am also having him maintain his Krill Oil, sildenafil, telmisartan, and rosuvastatin.  No orders of the defined  types were placed in this encounter.  CMA served as Education administrator during this visit. History, Physical and Plan performed by medical provider. Documentation and orders reviewed and attested to.  Penni Homans, MD

## 2018-01-07 NOTE — Assessment & Plan Note (Signed)
Well controlled, no changes to meds. Encouraged heart healthy diet such as the DASH diet and exercise as tolerated.  °

## 2018-01-10 LAB — VITAMIN D 1,25 DIHYDROXY
VITAMIN D 1, 25 (OH) TOTAL: 39 pg/mL (ref 18–72)
Vitamin D2 1, 25 (OH)2: 8 pg/mL
Vitamin D3 1, 25 (OH)2: 31 pg/mL

## 2018-03-13 ENCOUNTER — Other Ambulatory Visit: Payer: Self-pay | Admitting: Family Medicine

## 2018-06-20 ENCOUNTER — Other Ambulatory Visit: Payer: Self-pay | Admitting: Family Medicine

## 2018-06-20 MED ORDER — TELMISARTAN 80 MG PO TABS: 40.0000 mg | ORAL_TABLET | Freq: Two times a day (BID) | ORAL | 1 refills | Status: DC

## 2018-06-20 NOTE — Telephone Encounter (Signed)
Copied from [place] [phone]. Topic: Quick Communication - Rx Refill/Question >> Jun 20, 2018  9:07 AM Antonieta Iba C wrote: Medication: telmisartan (MICARDIS) 80 MG tablet   Has the patient contacted their pharmacy? Yes - pt says that pharmacy stated that they have sent several request.   (Agent: If no, request that the patient contact the pharmacy for the refill.) (Agent: If yes, when and what did the pharmacy advise?)  Preferred Pharmacy (with phone number or street name): [id number] - [place], [place] - [address] AT [place] [phone] (Phone) [phone] (Fax)    Agent: Please be advised that RX refills may take up to 3 business days. We ask that you follow-up with your pharmacy.

## 2018-07-08 ENCOUNTER — Ambulatory Visit (INDEPENDENT_AMBULATORY_CARE_PROVIDER_SITE_OTHER): Payer: Medicare Other | Admitting: Family Medicine

## 2018-07-08 ENCOUNTER — Encounter: Payer: Self-pay | Admitting: Family Medicine

## 2018-07-08 DIAGNOSIS — E039 Hypothyroidism, unspecified: Secondary | ICD-10-CM | POA: Diagnosis not present

## 2018-07-08 DIAGNOSIS — R350 Frequency of micturition: Secondary | ICD-10-CM | POA: Diagnosis not present

## 2018-07-08 DIAGNOSIS — I10 Essential (primary) hypertension: Secondary | ICD-10-CM | POA: Diagnosis not present

## 2018-07-08 DIAGNOSIS — R252 Cramp and spasm: Secondary | ICD-10-CM | POA: Diagnosis not present

## 2018-07-08 DIAGNOSIS — E782 Mixed hyperlipidemia: Secondary | ICD-10-CM

## 2018-07-08 DIAGNOSIS — E559 Vitamin D deficiency, unspecified: Secondary | ICD-10-CM

## 2018-07-08 DIAGNOSIS — R739 Hyperglycemia, unspecified: Secondary | ICD-10-CM

## 2018-07-08 LAB — COMPREHENSIVE METABOLIC PANEL
ALBUMIN: 4.3 g/dL (ref 3.5–5.2)
ALT: 18 U/L (ref 0–53)
AST: 19 U/L (ref 0–37)
Alkaline Phosphatase: 78 U/L (ref 39–117)
BUN: 22 mg/dL (ref 6–23)
CHLORIDE: 103 meq/L (ref 96–112)
CO2: 28 mEq/L (ref 19–32)
Calcium: 9.4 mg/dL (ref 8.4–10.5)
Creatinine, Ser: 1.1 mg/dL (ref 0.40–1.50)
GFR: 65.21 mL/min (ref >60.00)
Glucose, Bld: 91 mg/dL (ref 70–99)
POTASSIUM: 4.6 meq/L (ref 3.5–5.1)
SODIUM: 139 meq/L (ref 135–145)
Total Bilirubin: 0.8 mg/dL (ref 0.2–1.2)
Total Protein: 6.5 g/dL (ref 6.0–8.3)

## 2018-07-08 LAB — URINALYSIS
BILIRUBIN URINE: NEGATIVE
HGB URINE DIPSTICK: NEGATIVE
Ketones, ur: NEGATIVE
Leukocytes,Ua: NEGATIVE
Nitrite: NEGATIVE
PH: 6 (ref 5.0–8.0)
Specific Gravity, Urine: 1.015 (ref 1.000–1.030)
Total Protein, Urine: NEGATIVE
Urine Glucose: NEGATIVE
Urobilinogen, UA: 0.2 (ref 0.0–1.0)

## 2018-07-08 LAB — LIPID PANEL
CHOLESTEROL: 152 mg/dL (ref 0–200)
HDL: 62.5 mg/dL (ref >39.00)
LDL CALC: 70 mg/dL (ref 0–99)
NonHDL: 89.03
TRIGLYCERIDES: 93 mg/dL (ref 0.0–149.0)
Total CHOL/HDL Ratio: 2
VLDL: 18.6 mg/dL (ref 0.0–40.0)

## 2018-07-08 LAB — CBC
HEMATOCRIT: 43.3 % (ref 39.0–52.0)
Hemoglobin: 14.7 g/dL (ref 13.0–17.0)
MCHC: 34 g/dL (ref 30.0–36.0)
MCV: 94.4 fl (ref 78.0–100.0)
Platelets: 141 10*3/uL — ABNORMAL LOW (ref 150.0–400.0)
RBC: 4.59 Mil/uL (ref 4.22–5.81)
RDW: 13.1 % (ref 11.5–15.5)
WBC: 5.3 10*3/uL (ref 4.0–10.5)

## 2018-07-08 LAB — HEMOGLOBIN A1C: Hgb A1c MFr Bld: 6.1 % (ref 4.6–6.5)

## 2018-07-08 LAB — TSH: TSH: 1.13 u[IU]/mL (ref 0.35–4.50)

## 2018-07-08 LAB — MAGNESIUM: Magnesium: 2.1 mg/dL (ref 1.5–2.5)

## 2018-07-08 LAB — PSA: PSA: 0.96 ng/mL (ref 0.10–4.00)

## 2018-07-08 LAB — VITAMIN D 25 HYDROXY (VIT D DEFICIENCY, FRACTURES): VITD: 41.96 ng/mL (ref 30.00–100.00)

## 2018-07-08 NOTE — Assessment & Plan Note (Signed)
Check PSA and urinalysis

## 2018-07-08 NOTE — Assessment & Plan Note (Signed)
Denies CP/palp/SOB/HA/congestion/fevers/GI or GU c/o. Taking meds as prescribed 

## 2018-07-08 NOTE — Assessment & Plan Note (Signed)
Labs reveal deficiency. Start on Vitamin D 50000 IU caps, 1 cap po weekly x 12 weeks. Disp #4 with 4 rf. Also take daily Vitamin D over the counter. If already taking a daily supplement increase by 1000 IU daily and if not start Vitamin D 2000 IU daily.  

## 2018-07-08 NOTE — Assessment & Plan Note (Signed)
On Levothyroxine, continue to monitor 

## 2018-07-08 NOTE — Progress Notes (Signed)
Subjective:    Patient ID: Micheal Mcintyre, male    DOB: 07-Mar-1941, 78 y.o.   MRN: [id number]  No chief complaint on file.   HPI Patient is in today for follow up. He is doing well.no recent febrile illness or hospitalizations. His greatest concern is muscle cramps in his legs. It did increase when he started walking more. He does acknowledge he has not been hydrating adequately. He denies any trauma or falls. Denies CP/palp/SOB/HA/congestion/fevers/GI or GU c/o. Taking meds as prescribed  Past Medical History:  Diagnosis Date  . Arthritis   . GERD (gastroesophageal reflux disease)   . Hypertension   . Medicare annual wellness visit, subsequent 08/19/2014  . Osteoarthritis 09/21/2007   Qualifier: Diagnosis of  By: Niel Hummer MD, [place] [facility] 06/07/2016  . Right shoulder pain 12/03/2016    Past Surgical History:  Procedure Laterality Date  . CHOLECYSTECTOMY    . HIP SURGERY    . JOINT REPLACEMENT    . TOTAL HIP ARTHROPLASTY      Family History  Problem Relation Age of Onset  . Heart attack Father 80       deceased  . Diabetes Maternal Uncle        maternal grandfather  . Hypertension Neg Hx   . Breast cancer Neg Hx   . Colon cancer Neg Hx   . Prostate cancer Neg Hx     Social History   Socioeconomic History  . Marital status: Married    Spouse name: Not on file  . Number of children: Not on file  . Years of education: Not on file  . Highest education level: Not on file  Occupational History  . Not on file  Social Needs  . Financial resource strain: Not on file  . Food insecurity:    Worry: Not on file    Inability: Not on file  . Transportation needs:    Medical: Not on file    Non-medical: Not on file  Tobacco Use  . Smoking status: Never Smoker  . Smokeless tobacco: Never Used  Substance and Sexual Activity  . Alcohol use: Yes    Comment: occ  . Drug use: No  . Sexual activity: Not Currently  Lifestyle  . Physical activity:   Days per week: Not on file    Minutes per session: Not on file  . Stress: Not on file  Relationships  . Social connections:    Talks on phone: Not on file    Gets together: Not on file    Attends religious service: Not on file    Active member of club or organization: Not on file    Attends meetings of clubs or organizations: Not on file    Relationship status: Not on file  . Intimate partner violence:    Fear of current or ex partner: Not on file    Emotionally abused: Not on file    Physically abused: Not on file    Forced sexual activity: Not on file  Other Topics Concern  . Not on file  Social History Narrative  . Not on file    Outpatient Medications Prior to Visit  Medication Sig Dispense Refill  . Krill Oil 300 MG CAPS Take 1 capsule by mouth daily.    . rosuvastatin (CRESTOR) 5 MG tablet TAKE 1 TABLET(5 MG) BY MOUTH DAILY 30 tablet 5  . sildenafil (VIAGRA) 50 MG tablet Take 1 tablet (50 mg total) by  mouth daily as needed. 10 tablet 3  . telmisartan (MICARDIS) 80 MG tablet Take 0.5 tablets (40 mg total) by mouth 2 (two) times daily. 60 tablet 1   No facility-administered medications prior to visit.     No Known Allergies  Review of Systems  Constitutional: Negative for fever and malaise/fatigue.  HENT: Negative for congestion.   Eyes: Negative for blurred vision.  Respiratory: Negative for shortness of breath.   Cardiovascular: Negative for chest pain, palpitations and leg swelling.  Gastrointestinal: Negative for abdominal pain, blood in stool and nausea.  Genitourinary: Negative for dysuria and frequency.  Musculoskeletal: Positive for myalgias. Negative for falls.  Skin: Negative for rash.  Neurological: Negative for dizziness, loss of consciousness and headaches.  Endo/Heme/Allergies: Negative for environmental allergies.  Psychiatric/Behavioral: Negative for depression. The patient is not nervous/anxious.        Objective:    Physical Exam Vitals signs  and nursing note reviewed.  Constitutional:      General: He is not in acute distress.    Appearance: He is well-developed.  HENT:     Head: Normocephalic and atraumatic.     Nose: Nose normal.  Eyes:     General:        Right eye: No discharge.        Left eye: No discharge.  Neck:     Musculoskeletal: Normal range of motion and neck supple.  Cardiovascular:     Rate and Rhythm: Normal rate and regular rhythm.     Heart sounds: No murmur.  Pulmonary:     Effort: Pulmonary effort is normal.     Breath sounds: Normal breath sounds.  Abdominal:     General: Bowel sounds are normal.     Palpations: Abdomen is soft.     Tenderness: There is no abdominal tenderness.  Skin:    General: Skin is warm and dry.  Neurological:     Mental Status: He is alert and oriented to person, place, and time.     BP 112/70 (BP Location: Left Arm, Patient Position: Sitting, Cuff Size: Normal)   Pulse 70   Temp 98 F (36.7 C) (Oral)   Resp 18   Wt 165 lb 3.2 oz (74.9 kg)   SpO2 98%   BMI 24.40 kg/m  Wt Readings from Last 3 Encounters:  07/08/18 165 lb 3.2 oz (74.9 kg)  01/07/18 165 lb 6.4 oz (75 kg)  12/21/17 164 lb 6.4 oz (74.6 kg)     Lab Results  Component Value Date   WBC 4.6 01/07/2018   HGB 14.0 01/07/2018   HCT 40.9 01/07/2018   PLT 125.0 (L) 01/07/2018   GLUCOSE 101 (H) 01/07/2018   CHOL 120 01/07/2018   TRIG 75.0 01/07/2018   HDL 55.40 01/07/2018   LDLCALC 50 01/07/2018   ALT 16 01/07/2018   AST 12 01/07/2018   NA 141 01/07/2018   K 4.7 01/07/2018   CL 106 01/07/2018   CREATININE 1.07 01/07/2018   BUN 19 01/07/2018   CO2 29 01/07/2018   TSH 1.60 01/07/2018   PSA 1.23 08/13/2014   HGBA1C 6.1 01/07/2018    Lab Results  Component Value Date   TSH 1.60 01/07/2018   Lab Results  Component Value Date   WBC 4.6 01/07/2018   HGB 14.0 01/07/2018   HCT 40.9 01/07/2018   MCV 92.9 01/07/2018   PLT 125.0 (L) 01/07/2018   Lab Results  Component Value Date   NA  141 01/07/2018  K 4.7 01/07/2018   CO2 29 01/07/2018   GLUCOSE 101 (H) 01/07/2018   BUN 19 01/07/2018   CREATININE 1.07 01/07/2018   BILITOT 0.8 01/07/2018   ALKPHOS 79 01/07/2018   AST 12 01/07/2018   ALT 16 01/07/2018   PROT 6.6 01/07/2018   ALBUMIN 4.2 01/07/2018   CALCIUM 9.2 01/07/2018   GFR 71.65 01/07/2018   Lab Results  Component Value Date   CHOL 120 01/07/2018   Lab Results  Component Value Date   HDL 55.40 01/07/2018   Lab Results  Component Value Date   LDLCALC 50 01/07/2018   Lab Results  Component Value Date   TRIG 75.0 01/07/2018   Lab Results  Component Value Date   CHOLHDL 2 01/07/2018   Lab Results  Component Value Date   HGBA1C 6.1 01/07/2018       Assessment & Plan:   Problem List Items Addressed This Visit    Hypothyroidism    On Levothyroxine, continue to monitor      Hyperlipidemia, mixed    Encouraged heart healthy diet, increase exercise, avoid trans fats, consider a krill oil cap daily      Relevant Orders   Lipid panel   Essential hypertension    Denies CP/palp/SOB/HA/congestion/fevers/GI or GU c/o. Taking meds as prescribed      Relevant Orders   CBC   Comprehensive metabolic panel   TSH   FREQUENCY, URINARY    Check PSA and urinalysis      Relevant Orders   PSA   Urinalysis   Urine Culture   Hyperglycemia    hgba1c acceptable, minimize simple carbs. Increase exercise as tolerated.       Relevant Orders   Hemoglobin A1c   Vitamin D deficiency    Labs reveal deficiency. Start on Vitamin D 50000 IU caps, 1 cap po weekly x 12 weeks. Disp #4 with 4 rf. Also take daily Vitamin D over the counter. If already taking a daily supplement increase by 1000 IU daily and if not start Vitamin D 2000 IU daily.       Relevant Orders   VITAMIN D 25 Hydroxy (Vit-D Deficiency, Fractures)   Muscle cramps    Hydrate better and try [place]'s leg cramp med. Check labs and magnesium      Relevant Orders   Magnesium      I  am having Micheal Mcintyre maintain his Krill Oil, sildenafil, rosuvastatin, and telmisartan.  No orders of the defined types were placed in this encounter.    Penni Homans, MD

## 2018-07-08 NOTE — Patient Instructions (Signed)
[facility]yland's leg cramp medicine  Leg Cramps Leg cramps occur when one or more muscles tighten and you have no control over this tightening (involuntary muscle contraction). Muscle cramps can develop in any muscle, but the most common place is in the calf muscles of the leg. Those cramps can occur during exercise or when you are at rest. Leg cramps are painful, and they may last for a few seconds to a few minutes. Cramps may return several times before they finally stop. Usually, leg cramps are not caused by a serious medical problem. In many cases, the cause is not known. Some common causes include:  Excessive physical effort (overexertion), such as during intense exercise.  Overuse from repetitive motions, or doing the same thing over and over.  Staying in a certain position for a long period of time.  Improper preparation, form, or technique while performing a sport or an activity.  Dehydration.  Injury.  Side effects of certain medicines.  Abnormally low levels of minerals in your blood (electrolytes), especially potassium and calcium. This could result from: ? Pregnancy. ? Taking diuretic medicines. Follow these instructions at home: Eating and drinking  Drink enough fluid to keep your urine pale yellow. Staying hydrated may help prevent cramps.  Eat a healthy diet that includes plenty of nutrients to help your muscles function. A healthy diet includes fruits and vegetables, lean protein, whole grains, and low-fat or nonfat dairy products. Managing pain, stiffness, and swelling      Try massaging, stretching, and relaxing the affected muscle. Do this for several minutes at a time.  If directed, put ice on areas that are sore or painful after a cramp: ? Put ice in a plastic bag. ? Place a towel between your skin and the bag. ? Leave the ice on for 20 minutes, 2-3 times a day.  If directed, apply heat to muscles that are tense or tight. Do this before you exercise, or as often  as told by your health care provider. Use the heat source that your health care provider recommends, such as a moist heat pack or a heating pad. ? Place a towel between your skin and the heat source. ? Leave the heat on for 20-30 minutes. ? Remove the heat if your skin turns bright red. This is especially important if you are unable to feel pain, heat, or cold. You may have a greater risk of getting burned.  Try taking hot showers or baths to help relax tight muscles. General instructions  If you are having frequent leg cramps, avoid intense exercise for several days.  Take over-the-counter and prescription medicines only as told by your health care provider.  Keep all follow-up visits as told by your health care provider. This is important. Contact a health care provider if:  Your leg cramps get more severe or more frequent, or they do not improve over time.  Your foot becomes cold, numb, or blue. Summary  Muscle cramps can develop in any muscle, but the most common place is in the calf muscles of the leg.  Leg cramps are painful, and they may last for a few seconds to a few minutes.  Usually, leg cramps are not caused by a serious medical problem. Often, the cause is not known.  Stay hydrated and take over-the-counter and prescription medicines only as told by your health care provider. This information is not intended to replace advice given to you by your health care provider. Make sure you discuss any questions you  have with your health care provider. Document Released: 05/28/2004 Document Revised: 01/28/2017 Document Reviewed: 01/28/2017 Elsevier Interactive Patient Education  2019 [employer].

## 2018-07-08 NOTE — Assessment & Plan Note (Signed)
Hydrate better and try [facility]'s leg cramp med. Check labs and magnesium

## 2018-07-08 NOTE — Assessment & Plan Note (Signed)
hgba1c acceptable, minimize simple carbs. Increase exercise as tolerated.  

## 2018-07-08 NOTE — Assessment & Plan Note (Signed)
Encouraged heart healthy diet, increase exercise, avoid trans fats, consider a krill oil cap daily 

## 2018-07-09 LAB — URINE CULTURE
MICRO NUMBER:: 286899
Result:: NO GROWTH
SPECIMEN QUALITY:: ADEQUATE

## 2018-07-15 ENCOUNTER — Telehealth: Payer: Self-pay | Admitting: *Deleted

## 2018-07-15 NOTE — Telephone Encounter (Signed)
Received Immunization Admin Record from [facility]; forwarded to provider/SLS 03/13

## 2018-07-22 ENCOUNTER — Encounter: Payer: Self-pay | Admitting: Family Medicine

## 2018-09-13 ENCOUNTER — Other Ambulatory Visit: Payer: Self-pay | Admitting: Family Medicine

## 2018-11-17 ENCOUNTER — Other Ambulatory Visit: Payer: Self-pay | Admitting: Family Medicine

## 2018-12-22 NOTE — Progress Notes (Signed)
Virtual Visit via Video Note  I connected with patient on 12/23/18 at  9:00 AM EDT by audio enabled telemedicine application and verified that I am speaking with the correct person using two identifiers.   THIS ENCOUNTER IS A VIRTUAL VISIT DUE TO COVID-19 - PATIENT WAS NOT SEEN IN THE OFFICE. PATIENT HAS CONSENTED TO VIRTUAL VISIT / TELEMEDICINE VISIT   Location of patient: home  Location of provider: office  I discussed the limitations of evaluation and management by telemedicine and the availability of in person appointments. The patient expressed understanding and agreed to proceed.   Subjective:   Micheal Mcintyre is a 78 y.o. male who presents for Medicare Annual/Subsequent preventive examination.  Currently renovating a cottage in [place] that was left to his wife.   Review of Systems:    Sleep patterns: no issues Home Safety/Smoke Alarms: Feels safe in home. Smoke alarms in place.  Lives with wife in 2 story home. No trouble with stairs. Handrails in place.  Male:   CCS-    Due 04/2019 PSA-  Lab Results  Component Value Date   PSA 0.96 07/08/2018   PSA 1.23 08/13/2014   PSA 1.11 10/09/2011       Objective:     Advanced Directives 12/23/2018 12/21/2017 06/01/2016 08/13/2014  Does Patient Have a Medical Advance Directive? No No No No  Does patient want to make changes to medical advance directive? - - Yes (MAU/Ambulatory/Procedural Areas - Information given) -  Would patient like information on creating a medical advance directive? No - Patient declined Yes (MAU/Ambulatory/Procedural Areas - Information given) - Yes - Educational materials given    Tobacco Social History   Tobacco Use  Smoking Status Never Smoker  Smokeless Tobacco Never Used     Counseling given: Not Answered   Clinical Intake: Pain : No/denies pain    Past Medical History:  Diagnosis Date  . Arthritis   . GERD (gastroesophageal reflux disease)   . Hypertension   . Medicare annual  wellness visit, subsequent 08/19/2014  . Osteoarthritis 09/21/2007   Qualifier: Diagnosis of  By: Niel Hummer MD, [facility] 06/07/2016  . Right shoulder pain 12/03/2016   Past Surgical History:  Procedure Laterality Date  . CHOLECYSTECTOMY    . HIP SURGERY    . JOINT REPLACEMENT    . TOTAL HIP ARTHROPLASTY     Family History  Problem Relation Age of Onset  . Heart attack Father 36       deceased  . Diabetes Maternal Uncle        maternal grandfather  . Hypertension Neg Hx   . Breast cancer Neg Hx   . Colon cancer Neg Hx   . Prostate cancer Neg Hx    Social History   Socioeconomic History  . Marital status: Married    Spouse name: Not on file  . Number of children: Not on file  . Years of education: Not on file  . Highest education level: Not on file  Occupational History  . Not on file  Social Needs  . Financial resource strain: Not on file  . Food insecurity    Worry: Not on file    Inability: Not on file  . Transportation needs    Medical: Not on file    Non-medical: Not on file  Tobacco Use  . Smoking status: Never Smoker  . Smokeless tobacco: Never Used  Substance and Sexual Activity  . Alcohol use: Yes  Comment: occ  . Drug use: No  . Sexual activity: Not Currently  Lifestyle  . Physical activity    Days per week: Not on file    Minutes per session: Not on file  . Stress: Not on file  Relationships  . Social Herbalist on phone: Not on file    Gets together: Not on file    Attends religious service: Not on file    Active member of club or organization: Not on file    Attends meetings of clubs or organizations: Not on file    Relationship status: Not on file  Other Topics Concern  . Not on file  Social History Narrative  . Not on file    Outpatient Encounter Medications as of 12/23/2018  Medication Sig  . [name] 300 MG CAPS Take 1 capsule by mouth daily.  . rosuvastatin (CRESTOR) 5 MG tablet TAKE 1  TABLET(5 MG) BY MOUTH DAILY  . sildenafil (VIAGRA) 50 MG tablet Take 1 tablet (50 mg total) by mouth daily as needed.  Marland Kitchen telmisartan (MICARDIS) 80 MG tablet TAKE 1/2 TABLET(40 MG) BY MOUTH TWICE DAILY   No facility-administered encounter medications on file as of 12/23/2018.     Activities of Daily Living No flowsheet data found.  Patient Care Team: [name], MD as PCP - General (Family Medicine)   Assessment:   This is a routine wellness examination for [facility]. Physical assessment deferred to PCP.  Exercise Activities and Dietary recommendations   Diet (meal preparation, eat out, water intake, caffeinated beverages, dairy products, fruits and vegetables): in general, a "healthy" diet  , well balanced   Goals    . DIET - INCREASE WATER INTAKE       Fall Risk Fall Risk  12/23/2018 12/23/2018 12/21/2017 12/03/2016 06/01/2016  Falls in the past year? 1 0 No No Yes  Number falls in past yr: 0 - - - 2 or more  Comment - - - - tripped on things on the floor  Injury with Fall? 0 - - - No  Risk for fall due to : - - - - Impaired balance/gait;Impaired mobility  Follow up Education provided;Falls prevention discussed - - - Falls prevention discussed     Depression Screen PHQ 2/9 Scores 12/23/2018 12/21/2017 12/03/2016 06/01/2016  PHQ - 2 Score 0 0 0 0    Cognitive Function Ad8 score reviewed for issues:  Issues making decisions:no  Less interest in hobbies / activities:no  Repeats questions, stories (family complaining):no  Trouble using ordinary gadgets (microwave, computer, phone):no  Forgets the month or year: no  Mismanaging finances: no  Remembering appts:no  Daily problems with thinking and/or memory:no Ad8 score is=0     MMSE - Mini Mental State Exam 06/01/2016  Orientation to time 5  Orientation to Place 5  Registration 3  Attention/ Calculation 5  Recall 3  Language- name 2 objects 2  Language- repeat 1  Language- follow 3 step command 3  Language- read &  follow direction 1  Write a sentence 1  Copy design 1  Total score 30        Immunization History  Administered Date(s) Administered  . Influenza Whole 02/07/2008, 02/01/2009  . Influenza, High Dose Seasonal PF 01/07/2018  . Influenza,inj,Quad PF,6+ Mos 01/24/2013, 01/01/2014  . Influenza-Unspecified 02/23/2015, 03/04/2016  . Pneumococcal Conjugate-13 05/24/2013  . Pneumococcal Polysaccharide-23 02/07/2008, 06/01/2016  . Td 09/28/2007, 01/29/2018  . Zoster 10/19/2007, 05/02/2009  . Zoster Recombinat (Shingrix) 03/20/2018,  06/21/2018    Screening Tests Health Maintenance  Topic Date Due  . INFLUENZA VACCINE  12/03/2018  . COLONOSCOPY  04/16/2019  . TETANUS/TDAP  01/30/2028  . PNA vac Low Risk Adult  Completed   Plan:   Keep up the great work!!  See you next year!  I have personally reviewed and noted the following in the patient's chart:   . Medical and social history . Use of alcohol, tobacco or illicit drugs  . Current medications and supplements . Functional ability and status . Nutritional status . Physical activity . Advanced directives . List of other physicians . Hospitalizations, surgeries, and ER visits in previous 12 months . Vitals . Screenings to include cognitive, depression, and falls . Referrals and appointments  In addition, I have reviewed and discussed with patient certain preventive protocols, quality metrics, and best practice recommendations. A written personalized care plan for preventive services as well as general preventive health recommendations were provided to patient.     Shela Nevin, [place]  12/23/2018

## 2018-12-23 ENCOUNTER — Other Ambulatory Visit: Payer: Self-pay

## 2018-12-23 ENCOUNTER — Ambulatory Visit (INDEPENDENT_AMBULATORY_CARE_PROVIDER_SITE_OTHER): Payer: Medicare Other | Admitting: *Deleted

## 2018-12-23 ENCOUNTER — Encounter: Payer: Self-pay | Admitting: *Deleted

## 2018-12-23 DIAGNOSIS — Z0000 Encounter for general adult medical examination without abnormal findings: Secondary | ICD-10-CM | POA: Diagnosis not present

## 2018-12-23 NOTE — Patient Instructions (Signed)
Keep up the great work!!  See you next year!   Micheal Mcintyre , Thank you for taking time to come for your Medicare Wellness Visit. I appreciate your ongoing commitment to your health goals. Please review the following plan we discussed and let me know if I can assist you in the future.   These are the goals we discussed: Goals    . Exercise 150 min/wk Moderate Activity     Continue swimming at the [place].       This is a list of the screening recommended for you and due dates:  Health Maintenance  Topic Date Due  . Flu Shot  12/03/2018  . Colon Cancer Screening  04/16/2019  . Tetanus Vaccine  01/30/2028  . Pneumonia vaccines  Completed    Health Maintenance After Age 7 After age 57, you are at a higher risk for certain long-term diseases and infections as well as injuries from falls. Falls are a major cause of broken bones and head injuries in people who are older than age 74. Getting regular preventive care can help to keep you healthy and well. Preventive care includes getting regular testing and making lifestyle changes as recommended by your health care provider. Talk with your health care provider about:  Which screenings and tests you should have. A screening is a test that checks for a disease when you have no symptoms.  A diet and exercise plan that is right for you. What should I know about screenings and tests to prevent falls? Screening and testing are the best ways to find a health problem early. Early diagnosis and treatment give you the best chance of managing medical conditions that are common after age 39. Certain conditions and lifestyle choices may make you more likely to have a fall. Your health care provider may recommend:  Regular vision checks. Poor vision and conditions such as cataracts can make you more likely to have a fall. If you wear glasses, make sure to get your prescription updated if your vision changes.  Medicine review. Work with your health  care provider to regularly review all of the medicines you are taking, including over-the-counter medicines. Ask your health care provider about any side effects that may make you more likely to have a fall. Tell your health care provider if any medicines that you take make you feel dizzy or sleepy.  Osteoporosis screening. Osteoporosis is a condition that causes the bones to get weaker. This can make the bones weak and cause them to break more easily.  Blood pressure screening. Blood pressure changes and medicines to control blood pressure can make you feel dizzy.  Strength and balance checks. Your health care provider may recommend certain tests to check your strength and balance while standing, walking, or changing positions.  Foot health exam. Foot pain and numbness, as well as not wearing proper footwear, can make you more likely to have a fall.  Depression screening. You may be more likely to have a fall if you have a fear of falling, feel emotionally low, or feel unable to do activities that you used to do.  Alcohol use screening. Using too much alcohol can affect your balance and may make you more likely to have a fall. What actions can I take to lower my risk of falls? General instructions  Talk with your health care provider about your risks for falling. Tell your health care provider if: ? You fall. Be sure to tell your health care provider  about all falls, even ones that seem minor. ? You feel dizzy, sleepy, or off-balance.  Take over-the-counter and prescription medicines only as told by your health care provider. These include any supplements.  Eat a healthy diet and maintain a healthy weight. A healthy diet includes low-fat dairy products, low-fat (lean) meats, and fiber from whole grains, beans, and lots of fruits and vegetables. Home safety  Remove any tripping hazards, such as rugs, cords, and clutter.  Install safety equipment such as grab bars in bathrooms and safety  rails on stairs.  Keep rooms and walkways well-lit. Activity   Follow a regular exercise program to stay fit. This will help you maintain your balance. Ask your health care provider what types of exercise are appropriate for you.  If you need a cane or walker, use it as recommended by your health care provider.  Wear supportive shoes that have nonskid soles. Lifestyle  Do not drink alcohol if your health care provider tells you not to drink.  If you drink alcohol, limit how much you have: ? 0-1 drink a day for women. ? 0-2 drinks a day for men.  Be aware of how much alcohol is in your drink. In the [place] one drink equals one typical bottle of beer (12 oz), one-half glass of wine (5 oz), or one shot of hard liquor (1 oz).  Do not use any products that contain nicotine or tobacco, such as cigarettes and e-cigarettes. If you need help quitting, ask your health care provider. Summary  Having a healthy lifestyle and getting preventive care can help to protect your health and wellness after age 49.  Screening and testing are the best way to find a health problem early and help you avoid having a fall. Early diagnosis and treatment give you the best chance for managing medical conditions that are more common for people who are older than age 28.  Falls are a major cause of broken bones and head injuries in people who are older than age 24. Take precautions to prevent a fall at home.  Work with your health care provider to learn what changes you can make to improve your health and wellness and to prevent falls. This information is not intended to replace advice given to you by your health care provider. Make sure you discuss any questions you have with your health care provider. Document Released: 03/03/2017 Document Revised: 08/11/2018 Document Reviewed: 03/03/2017 Elsevier Patient Education  2020 [employer].

## 2018-12-29 NOTE — Progress Notes (Signed)
Noted. Agree with above.  [place], DO 12/29/18 4:16 PM

## 2019-01-12 ENCOUNTER — Encounter: Payer: Medicare Other | Admitting: Family Medicine

## 2019-04-10 ENCOUNTER — Other Ambulatory Visit: Payer: Self-pay

## 2019-04-11 ENCOUNTER — Encounter: Payer: Medicare Other | Admitting: Family Medicine

## 2019-05-02 ENCOUNTER — Ambulatory Visit: Payer: Medicare Other | Admitting: Family Medicine

## 2019-05-09 ENCOUNTER — Encounter: Payer: Self-pay | Admitting: Internal Medicine

## 2019-05-16 ENCOUNTER — Other Ambulatory Visit: Payer: Self-pay | Admitting: Family Medicine

## 2019-05-27 ENCOUNTER — Ambulatory Visit: Payer: Medicare Other | Attending: Internal Medicine

## 2019-05-27 DIAGNOSIS — Z23 Encounter for immunization: Secondary | ICD-10-CM

## 2019-05-27 NOTE — Progress Notes (Signed)
   Covid-19 Vaccination Clinic  Name:  Micheal Mcintyre    MRN: [id number] DOB: 1940/06/22  05/27/2019  [name] was observed post Covid-19 immunization for 15 minutes without incidence. He was provided with Vaccine Information Sheet and instruction to access the V-Safe system.   [name] was instructed to call 911 with any severe reactions post vaccine: Marland Kitchen Difficulty breathing  . Swelling of your face and throat  . A fast heartbeat  . A bad rash all over your body  . Dizziness and weakness    Immunizations Administered    Name Date Dose VIS Date Route   Pfizer COVID-19 Vaccine 05/27/2019 11:15 AM 0.3 mL 04/14/2019 Intramuscular   Manufacturer: [place]   Lot: [id number]   [place]: [id number]

## 2019-06-15 ENCOUNTER — Encounter: Payer: Medicare Other | Admitting: Family Medicine

## 2019-06-17 ENCOUNTER — Ambulatory Visit: Payer: Medicare Other

## 2019-06-19 ENCOUNTER — Ambulatory Visit: Payer: Medicare Other | Attending: Internal Medicine

## 2019-06-19 DIAGNOSIS — Z23 Encounter for immunization: Secondary | ICD-10-CM

## 2019-06-19 NOTE — Progress Notes (Signed)
   Covid-19 Vaccination Clinic  Name:  Micheal Mcintyre    MRN: [id number] DOB: Dec 21, 1940  06/19/2019  [name] was observed post Covid-19 immunization for 15 minutes without incidence. He was provided with Vaccine Information Sheet and instruction to access the V-Safe system.   [name] was instructed to call 911 with any severe reactions post vaccine: Marland Kitchen Difficulty breathing  . Swelling of your face and throat  . A fast heartbeat  . A bad rash all over your body  . Dizziness and weakness    Immunizations Administered    Name Date Dose VIS Date Route   Pfizer COVID-19 Vaccine 06/19/2019  8:31 AM 0.3 mL 04/14/2019 Intramuscular   Manufacturer: [place]   Lot: [id number]   [place]: [id number]

## 2019-07-01 ENCOUNTER — Other Ambulatory Visit: Payer: Self-pay | Admitting: Family Medicine

## 2019-07-31 ENCOUNTER — Other Ambulatory Visit: Payer: Self-pay

## 2019-08-01 ENCOUNTER — Encounter: Payer: Self-pay | Admitting: Family Medicine

## 2019-08-01 ENCOUNTER — Ambulatory Visit (INDEPENDENT_AMBULATORY_CARE_PROVIDER_SITE_OTHER): Payer: Medicare Other | Admitting: Family Medicine

## 2019-08-01 ENCOUNTER — Other Ambulatory Visit: Payer: Self-pay

## 2019-08-01 VITALS — BP 118/61 | HR 65 | Temp 97.3°F | Resp 12 | Ht 69.0 in | Wt 165.2 lb

## 2019-08-01 DIAGNOSIS — I10 Essential (primary) hypertension: Secondary | ICD-10-CM | POA: Diagnosis not present

## 2019-08-01 DIAGNOSIS — M25511 Pain in right shoulder: Secondary | ICD-10-CM

## 2019-08-01 DIAGNOSIS — R739 Hyperglycemia, unspecified: Secondary | ICD-10-CM | POA: Diagnosis not present

## 2019-08-01 DIAGNOSIS — Z0000 Encounter for general adult medical examination without abnormal findings: Secondary | ICD-10-CM

## 2019-08-01 DIAGNOSIS — E782 Mixed hyperlipidemia: Secondary | ICD-10-CM | POA: Diagnosis not present

## 2019-08-01 DIAGNOSIS — E039 Hypothyroidism, unspecified: Secondary | ICD-10-CM

## 2019-08-01 DIAGNOSIS — E559 Vitamin D deficiency, unspecified: Secondary | ICD-10-CM | POA: Diagnosis not present

## 2019-08-01 LAB — COMPREHENSIVE METABOLIC PANEL
ALT: 15 U/L (ref 0–53)
AST: 13 U/L (ref 0–37)
Albumin: 4.2 g/dL (ref 3.5–5.2)
Alkaline Phosphatase: 81 U/L (ref 39–117)
BUN: 23 mg/dL (ref 6–23)
CO2: 29 mEq/L (ref 19–32)
Calcium: 9.2 mg/dL (ref 8.4–10.5)
Chloride: 104 mEq/L (ref 96–112)
Creatinine, Ser: 1 mg/dL (ref 0.40–1.50)
GFR: 72.58 mL/min (ref >60.00)
Glucose, Bld: 84 mg/dL (ref 70–99)
Potassium: 4.2 mEq/L (ref 3.5–5.1)
Sodium: 138 mEq/L (ref 135–145)
Total Bilirubin: 0.8 mg/dL (ref 0.2–1.2)
Total Protein: 6.5 g/dL (ref 6.0–8.3)

## 2019-08-01 LAB — CBC
HCT: 42.1 % (ref 39.0–52.0)
Hemoglobin: 14.1 g/dL (ref 13.0–17.0)
MCHC: 33.5 g/dL (ref 30.0–36.0)
MCV: 93.6 fl (ref 78.0–100.0)
Platelets: 130 10*3/uL — ABNORMAL LOW (ref 150.0–400.0)
RBC: 4.5 Mil/uL (ref 4.22–5.81)
RDW: 13.1 % (ref 11.5–15.5)
WBC: 5.1 10*3/uL (ref 4.0–10.5)

## 2019-08-01 LAB — LIPID PANEL
Cholesterol: 135 mg/dL (ref 0–200)
HDL: 50.1 mg/dL (ref >39.00)
LDL Cholesterol: 68 mg/dL (ref 0–99)
NonHDL: 85
Total CHOL/HDL Ratio: 3
Triglycerides: 83 mg/dL (ref 0.0–149.0)
VLDL: 16.6 mg/dL (ref 0.0–40.0)

## 2019-08-01 LAB — HEMOGLOBIN A1C: Hgb A1c MFr Bld: 6.1 % (ref 4.6–6.5)

## 2019-08-01 LAB — TSH: TSH: 1.29 u[IU]/mL (ref 0.35–4.50)

## 2019-08-01 NOTE — Assessment & Plan Note (Signed)
He continues to struggle with decreased ROM and daily discomfort but he is able to stay active and keep swimming. He chooses not to proceed with surgery for now.

## 2019-08-01 NOTE — Assessment & Plan Note (Deleted)
On Levothyroxine, continue to monitor 

## 2019-08-01 NOTE — Patient Instructions (Signed)
[facility] Blood Pressure cuff, upper arm, want BP 100-140/60-90 Pulse oximeter, want oxygen in 90s  Weekly vitals  Take Multivitamin with minerals, selenium Vitamin D 1000-2000 IU daily Probiotic with lactobacillus and bifidophilus Asprin EC 81 mg daily Fish or Krill oil caps daily Melatonin 2-5 mg at bedtime  [email or web address]   Preventive Care 77 Years and Older, Male Preventive care refers to lifestyle choices and visits with your health care provider that can promote health and wellness. This includes:  A yearly physical exam. This is also called an annual well check.  Regular dental and eye exams.  Immunizations.  Screening for certain conditions.  Healthy lifestyle choices, such as diet and exercise. What can I expect for my preventive care visit? Physical exam Your health care provider will check:  Height and weight. These may be used to calculate body mass index (BMI), which is a measurement that tells if you are at a healthy weight.  Heart rate and blood pressure.  Your skin for abnormal spots. Counseling Your health care provider may ask you questions about:  Alcohol, tobacco, and drug use.  Emotional well-being.  Home and relationship well-being.  Sexual activity.  Eating habits.  History of falls.  Memory and ability to understand (cognition).  Work and work Statistician. What immunizations do I need?  Influenza (flu) vaccine  This is recommended every year. Tetanus, diphtheria, and pertussis (Tdap) vaccine  You may need a Td booster every 10 years. Varicella (chickenpox) vaccine  You may need this vaccine if you have not already been vaccinated. Zoster (shingles) vaccine  You may need this after age 77. Pneumococcal conjugate (PCV13) vaccine  One dose is recommended after age 77. Pneumococcal polysaccharide (PPSV23) vaccine  One dose is recommended after age 2. Measles, mumps, and rubella (MMR)  vaccine  You may need at least one dose of MMR if you were born in 1957 or later. You may also need a second dose. Meningococcal conjugate (MenACWY) vaccine  You may need this if you have certain conditions. Hepatitis A vaccine  You may need this if you have certain conditions or if you travel or work in places where you may be exposed to hepatitis A. Hepatitis B vaccine  You may need this if you have certain conditions or if you travel or work in places where you may be exposed to hepatitis B. Haemophilus influenzae type b (Hib) vaccine  You may need this if you have certain conditions. You may receive vaccines as individual doses or as more than one vaccine together in one shot (combination vaccines). Talk with your health care provider about the risks and benefits of combination vaccines. What tests do I need? Blood tests  Lipid and cholesterol levels. These may be checked every 5 years, or more frequently depending on your overall health.  Hepatitis C test.  Hepatitis B test. Screening  Lung cancer screening. You may have this screening every year starting at age 77 if you have a 30-pack-year history of smoking and currently smoke or have quit within the past 15 years.  Colorectal cancer screening. All adults should have this screening starting at age 77 and continuing until age 55. Your health care provider may recommend screening at age 83 if you are at increased risk. You will have tests every 1-10 years, depending on your results and the type of screening test.  Prostate cancer screening. Recommendations will vary depending on your family history and other risks.  Diabetes screening. This is done by checking  your blood sugar (glucose) after you have not eaten for a while (fasting). You may have this done every 1-3 years.  Abdominal aortic aneurysm (AAA) screening. You may need this if you are a current or former smoker.  Sexually transmitted disease (STD) testing. Follow  these instructions at home: Eating and drinking  Eat a diet that includes fresh fruits and vegetables, whole grains, lean protein, and low-fat dairy products. Limit your intake of foods with high amounts of sugar, saturated fats, and salt.  Take vitamin and mineral supplements as recommended by your health care provider.  Do not drink alcohol if your health care provider tells you not to drink.  If you drink alcohol: ? Limit how much you have to 0-2 drinks a day. ? Be aware of how much alcohol is in your drink. In the [place] one drink equals one 12 oz bottle of beer (355 mL), one 5 oz glass of wine (148 mL), or one 1 oz glass of hard liquor (44 mL). Lifestyle  Take daily care of your teeth and gums.  Stay active. Exercise for at least 30 minutes on 5 or more days each week.  Do not use any products that contain nicotine or tobacco, such as cigarettes, e-cigarettes, and chewing tobacco. If you need help quitting, ask your health care provider.  If you are sexually active, practice safe sex. Use a condom or other form of protection to prevent STIs (sexually transmitted infections).  Talk with your health care provider about taking a low-dose aspirin or statin. What's next?  Visit your health care provider once a year for a well check visit.  Ask your health care provider how often you should have your eyes and teeth checked.  Stay up to date on all vaccines. This information is not intended to replace advice given to you by your health care provider. Make sure you discuss any questions you have with your health care provider. Document Revised: 04/14/2018 Document Reviewed: 04/14/2018 Elsevier Patient Education  2020 [employer].

## 2019-08-01 NOTE — Assessment & Plan Note (Addendum)
Patient encouraged to maintain heart healthy diet, regular exercise, adequate sleep. Consider daily probiotics. Take medications as prescribed. Labs ordered and reviewed. Encouraged to consider repeat screening colonoscopy

## 2019-08-01 NOTE — Assessment & Plan Note (Signed)
Encouraged heart healthy diet, increase exercise, avoid trans fats, consider a krill oil cap daily 

## 2019-08-01 NOTE — Assessment & Plan Note (Signed)
hgba1c acceptable, minimize simple carbs. Increase exercise as tolerated.  

## 2019-08-01 NOTE — Assessment & Plan Note (Signed)
Supplement and monitor 

## 2019-08-01 NOTE — Progress Notes (Signed)
Subjective:    Patient ID: Micheal Mcintyre, male    DOB: [birth year], 79 y.o.   MRN: [id number]  Chief Complaint  Patient presents with  . Annual Exam    HPI Patient is in today for annual preventative exam and follow up on chronic medical concerns. He has been mostly staying in during the pandemic and has felt well. He has continued to swim regularly and eat well. No recent febrile illness or hospitalizations. He has had both of his COVID shots and feels he tolerated them well. Denies CP/palp/SOB/HA/congestion/fevers/GI or GU c/o. Taking meds as prescribed. He continues to have decreased ROM and strength in right arm secondary to shoulder dysfunction.   Past Medical History:  Diagnosis Date  . Arthritis   . GERD (gastroesophageal reflux disease)   . Hypertension   . Medicare annual wellness visit, subsequent 08/19/2014  . Osteoarthritis 09/21/2007   Qualifier: Diagnosis of  By: Niel Hummer MD, [place] Preventative health care 06/07/2016  . Right shoulder pain 12/03/2016    Past Surgical History:  Procedure Laterality Date  . CHOLECYSTECTOMY    . HIP SURGERY    . JOINT REPLACEMENT    . TOTAL HIP ARTHROPLASTY      Family History  Problem Relation Age of Onset  . Heart attack Father 90       deceased  . Diabetes Maternal Uncle        maternal grandfather  . Hypertension Neg Hx   . Breast cancer Neg Hx   . Colon cancer Neg Hx   . Prostate cancer Neg Hx     Social History   Socioeconomic History  . Marital status: Married    Spouse name: Not on file  . Number of children: Not on file  . Years of education: Not on file  . Highest education level: Not on file  Occupational History  . Not on file  Tobacco Use  . Smoking status: Never Smoker  . Smokeless tobacco: Never Used  Substance and Sexual Activity  . Alcohol use: Yes    Comment: occ  . Drug use: No  . Sexual activity: Not Currently  Other Topics Concern  . Not on file  Social History Narrative  . Not on  file   Social Determinants of Health   Financial Resource Strain:   . Difficulty of Paying Living Expenses:   Food Insecurity:   . Worried About Charity fundraiser in the Last Year:   . Arboriculturist in the Last Year:   Transportation Needs:   . Film/video editor (Medical):   Marland Kitchen Lack of Transportation (Non-Medical):   Physical Activity:   . Days of Exercise per Week:   . Minutes of Exercise per Session:   Stress:   . Feeling of Stress :   Social Connections:   . Frequency of Communication with Friends and Family:   . Frequency of Social Gatherings with Friends and Family:   . Attends Religious Services:   . Active Member of Clubs or Organizations:   . Attends Archivist Meetings:   Marland Kitchen Marital Status:   Intimate Partner Violence:   . Fear of Current or Ex-Partner:   . Emotionally Abused:   Marland Kitchen Physically Abused:   . Sexually Abused:     Outpatient Medications Prior to Visit  Medication Sig Dispense Refill  . Krill Oil 300 MG CAPS Take 1 capsule by mouth daily.    . rosuvastatin (CRESTOR)  5 MG tablet TAKE 1 TABLET(5 MG) BY MOUTH DAILY 30 tablet 5  . sildenafil (VIAGRA) 50 MG tablet Take 1 tablet (50 mg total) by mouth daily as needed. 10 tablet 3  . telmisartan (MICARDIS) 80 MG tablet TAKE 1/2 TABLET(40 MG) BY MOUTH TWICE DAILY 60 tablet 1   No facility-administered medications prior to visit.    No Known Allergies  Review of Systems  Constitutional: Negative for fever and malaise/fatigue.  HENT: Negative for congestion.   Eyes: Negative for blurred vision.  Respiratory: Negative for shortness of breath.   Cardiovascular: Negative for chest pain, palpitations and leg swelling.  Gastrointestinal: Negative for abdominal pain, blood in stool and nausea.  Genitourinary: Negative for dysuria and frequency.  Musculoskeletal: Negative for falls.  Skin: Negative for rash.  Neurological: Negative for dizziness, loss of consciousness and headaches.    Endo/Heme/Allergies: Negative for environmental allergies.  Psychiatric/Behavioral: Negative for depression. The patient is not nervous/anxious.        Objective:    Physical Exam Vitals and nursing note reviewed.  Constitutional:      General: He is not in acute distress.    Appearance: He is well-developed.  HENT:     Head: Normocephalic and atraumatic.     Nose: Nose normal.  Eyes:     General:        Right eye: No discharge.        Left eye: No discharge.  Cardiovascular:     Rate and Rhythm: Normal rate and regular rhythm.     Heart sounds: No murmur.  Pulmonary:     Effort: Pulmonary effort is normal.     Breath sounds: Normal breath sounds.  Abdominal:     General: Bowel sounds are normal.     Palpations: Abdomen is soft.     Tenderness: There is no abdominal tenderness.  Musculoskeletal:     Cervical back: Normal range of motion and neck supple.     Comments: Decreased ROM right shoulder, diminished strength right arm secondary to pain, Left leg with lift shoe and diminished strength secondary to old hip surgery  Skin:    General: Skin is warm and dry.  Neurological:     Mental Status: He is alert and oriented to person, place, and time.     BP 118/61 (BP Location: Right Arm, Cuff Size: Normal)   Pulse 65   Temp (!) 97.3 F (36.3 C) (Temporal)   Resp 12   Ht 5\' 9"  (1.753 m)   Wt 165 lb 3.2 oz (74.9 kg)   SpO2 97%   BMI 24.40 kg/m  Wt Readings from Last 3 Encounters:  08/01/19 165 lb 3.2 oz (74.9 kg)  07/08/18 165 lb 3.2 oz (74.9 kg)  01/07/18 165 lb 6.4 oz (75 kg)    Diabetic Foot Exam - Simple   No data filed     Lab Results  Component Value Date   WBC 5.3 07/08/2018   HGB 14.7 07/08/2018   HCT 43.3 07/08/2018   PLT 141.0 (L) 07/08/2018   GLUCOSE 91 07/08/2018   CHOL 152 07/08/2018   TRIG 93.0 07/08/2018   HDL 62.50 07/08/2018   LDLCALC 70 07/08/2018   ALT 18 07/08/2018   AST 19 07/08/2018   NA 139 07/08/2018   K 4.6 07/08/2018   CL  103 07/08/2018   CREATININE 1.10 07/08/2018   BUN 22 07/08/2018   CO2 28 07/08/2018   TSH 1.13 07/08/2018   PSA 0.96 07/08/2018  HGBA1C 6.1 07/08/2018    Lab Results  Component Value Date   TSH 1.13 07/08/2018   Lab Results  Component Value Date   WBC 5.3 07/08/2018   HGB 14.7 07/08/2018   HCT 43.3 07/08/2018   MCV 94.4 07/08/2018   PLT 141.0 (L) 07/08/2018   Lab Results  Component Value Date   NA 139 07/08/2018   K 4.6 07/08/2018   CO2 28 07/08/2018   GLUCOSE 91 07/08/2018   BUN 22 07/08/2018   CREATININE 1.10 07/08/2018   BILITOT 0.8 07/08/2018   ALKPHOS 78 07/08/2018   AST 19 07/08/2018   ALT 18 07/08/2018   PROT 6.5 07/08/2018   ALBUMIN 4.3 07/08/2018   CALCIUM 9.4 07/08/2018   GFR 65.21 07/08/2018   Lab Results  Component Value Date   CHOL 152 07/08/2018   Lab Results  Component Value Date   HDL 62.50 07/08/2018   Lab Results  Component Value Date   LDLCALC 70 07/08/2018   Lab Results  Component Value Date   TRIG 93.0 07/08/2018   Lab Results  Component Value Date   CHOLHDL 2 07/08/2018   Lab Results  Component Value Date   HGBA1C 6.1 07/08/2018       Assessment & Plan:   Problem List Items Addressed This Visit    Hypothyroidism   Hyperlipidemia, mixed    Encouraged heart healthy diet, increase exercise, avoid trans fats, consider a krill oil cap daily      Relevant Orders   Lipid panel   Essential hypertension    Well controlled, no changes to meds. Encouraged heart healthy diet such as the DASH diet and exercise as tolerated.       Relevant Orders   CBC   Comprehensive metabolic panel   TSH   Hyperglycemia    hgba1c acceptable, minimize simple carbs. Increase exercise as tolerated.       Relevant Orders   Hemoglobin A1c   Vitamin D deficiency    Supplement and monitor      Preventative health care    Patient encouraged to maintain heart healthy diet, regular exercise, adequate sleep. Consider daily probiotics. Take  medications as prescribed. Labs ordered and reviewed. Encouraged to consider repeat screening colonoscopy      Right shoulder pain    He continues to struggle with decreased ROM and daily discomfort but he is able to stay active and keep swimming. He chooses not to proceed with surgery for now.          I am having Larenz C. Smeltzer maintain his Krill Oil, sildenafil, telmisartan, and rosuvastatin.  No orders of the defined types were placed in this encounter.    Penni Homans, MD

## 2019-08-01 NOTE — Assessment & Plan Note (Signed)
Well controlled, no changes to meds. Encouraged heart healthy diet such as the DASH diet and exercise as tolerated.  °

## 2019-08-07 ENCOUNTER — Other Ambulatory Visit: Payer: Self-pay | Admitting: *Deleted

## 2019-08-07 DIAGNOSIS — D696 Thrombocytopenia, unspecified: Secondary | ICD-10-CM

## 2019-09-12 ENCOUNTER — Other Ambulatory Visit: Payer: Self-pay | Admitting: Family Medicine

## 2019-09-14 ENCOUNTER — Other Ambulatory Visit (INDEPENDENT_AMBULATORY_CARE_PROVIDER_SITE_OTHER): Payer: Medicare Other

## 2019-09-14 ENCOUNTER — Other Ambulatory Visit: Payer: Self-pay

## 2019-09-14 DIAGNOSIS — D696 Thrombocytopenia, unspecified: Secondary | ICD-10-CM | POA: Diagnosis not present

## 2019-09-14 LAB — CBC
HCT: 40.7 % (ref 39.0–52.0)
Hemoglobin: 13.9 g/dL (ref 13.0–17.0)
MCHC: 34.1 g/dL (ref 30.0–36.0)
MCV: 94.3 fl (ref 78.0–100.0)
Platelets: 132 10*3/uL — ABNORMAL LOW (ref 150.0–400.0)
RBC: 4.32 Mil/uL (ref 4.22–5.81)
RDW: 13.1 % (ref 11.5–15.5)
WBC: 4.9 10*3/uL (ref 4.0–10.5)

## 2019-09-18 ENCOUNTER — Other Ambulatory Visit: Payer: Medicare Other

## 2019-12-25 NOTE — Progress Notes (Signed)
I connected with Micheal Mcintyre today by telephone and verified that I am speaking with the correct person using two identifiers. Location patient: home Location provider: work Persons participating in the virtual visit: patient, Marine scientist.    I discussed the limitations, risks, security and privacy concerns of performing an evaluation and management service by telephone and the availability of in person appointments. I also discussed with the patient that there may be a patient responsible charge related to this service. The patient expressed understanding and verbally consented to this telephonic visit.    Interactive audio and video telecommunications were attempted between this provider and patient, however failed, due to patient having technical difficulties OR patient did not have access to video capability.  We continued and completed visit with audio only.  Some vital signs may be absent or patient reported.     Subjective:   Micheal Mcintyre is a 79 y.o. male who presents for Medicare Annual/Subsequent preventive examination.  Review of Systems    Cardiac Risk Factors include: advanced age (>39men, >54 women);dyslipidemia;hypertension;male gender     Objective:    Today's Vitals   12/26/19 0850  BP: 130/70  Weight: 165 lb (74.8 kg)   Body mass index is 24.37 kg/m.  Advanced Directives 12/26/2019 12/23/2018 12/21/2017 06/01/2016 08/13/2014  Does Patient Have a Medical Advance Directive? Yes No No No No  Type of Paramedic of [place];Living will - - - -  Does patient want to make changes to medical advance directive? No - Patient declined - - Yes (MAU/Ambulatory/Procedural Areas - Information given) -  Copy of [place] in Chart? No - copy requested - - - -  Would patient like information on creating a medical advance directive? - No - Patient declined Yes (MAU/Ambulatory/Procedural Areas - Information given) - Yes - Educational materials given     Current Medications (verified) Outpatient Encounter Medications as of 12/26/2019  Medication Sig  . [name] 300 MG CAPS Take 1 capsule by mouth daily.  . rosuvastatin (CRESTOR) 5 MG tablet TAKE 1 TABLET(5 MG) BY MOUTH DAILY  . sildenafil (VIAGRA) 50 MG tablet Take 1 tablet (50 mg total) by mouth daily as needed.  Marland Kitchen telmisartan (MICARDIS) 80 MG tablet TAKE 1/2 TABLET(40 MG) BY MOUTH TWICE DAILY   No facility-administered encounter medications on file as of 12/26/2019.    Allergies (verified) Patient has no known allergies.   History: Past Medical History:  Diagnosis Date  . Arthritis   . GERD (gastroesophageal reflux disease)   . Hypertension   . Medicare annual wellness visit, subsequent 08/19/2014  . Osteoarthritis 09/21/2007   Qualifier: Diagnosis of  By: Niel Hummer MD, [place] Preventative health care 06/07/2016  . Right shoulder pain 12/03/2016   Past Surgical History:  Procedure Laterality Date  . CHOLECYSTECTOMY    . HIP SURGERY    . JOINT REPLACEMENT    . TOTAL HIP ARTHROPLASTY     Family History  Problem Relation Age of Onset  . Heart attack Father 61       deceased  . Diabetes Maternal Uncle        maternal grandfather  . Hypertension Neg Hx   . Breast cancer Neg Hx   . Colon cancer Neg Hx   . Prostate cancer Neg Hx    Social History   Socioeconomic History  . Marital status: Married    Spouse name: Not on file  . Number of children: Not on file  .  Years of education: Not on file  . Highest education level: Not on file  Occupational History  . Not on file  Tobacco Use  . Smoking status: Never Smoker  . Smokeless tobacco: Never Used  Vaping Use  . Vaping Use: Never used  Substance and Sexual Activity  . Alcohol use: Yes    Comment: occ  . Drug use: No  . Sexual activity: Not Currently  Other Topics Concern  . Not on file  Social History Narrative  . Not on file   Social Determinants of Health   Financial Resource Strain: Low Risk    . Difficulty of Paying Living Expenses: Not hard at all  Food Insecurity: No Food Insecurity  . Worried About Charity fundraiser in the Last Year: Never true  . Ran Out of Food in the Last Year: Never true  Transportation Needs: No Transportation Needs  . Lack of Transportation (Medical): No  . Lack of Transportation (Non-Medical): No  Physical Activity:   . Days of Exercise per Week: Not on file  . Minutes of Exercise per Session: Not on file  Stress:   . Feeling of Stress : Not on file  Social Connections:   . Frequency of Communication with Friends and Family: Not on file  . Frequency of Social Gatherings with Friends and Family: Not on file  . Attends Religious Services: Not on file  . Active Member of Clubs or Organizations: Not on file  . Attends Archivist Meetings: Not on file  . Marital Status: Not on file    Tobacco Counseling Counseling given: Not Answered   Clinical Intake:     Pain : No/denies pain            Activities of Daily Living In your present state of health, do you have any difficulty performing the following activities: 12/26/2019  Hearing? N  Vision? N  Difficulty concentrating or making decisions? N  Walking or climbing stairs? N  Dressing or bathing? N  Doing errands, shopping? N  Preparing Food and eating ? N  Using the Toilet? N  In the past six months, have you accidently leaked urine? N  Do you have problems with loss of bowel control? N  Managing your Medications? N  Managing your Finances? N  Housekeeping or managing your Housekeeping? N  Some recent data might be hidden    Patient Care Team: [name], MD as PCP - General (Family Medicine)  Indicate any recent Medical Services you may have received from other than [facility] providers in the past year (date may be approximate).     Assessment:   This is a routine wellness examination for [facility].  Hearing/Vision screen No exam data present  Dietary issues  and exercise activities discussed: Current Exercise Habits: Structured exercise class, Type of exercise: walking (swimming), Time (Minutes): 30, Frequency (Times/Week): 5, Weekly Exercise (Minutes/Week): 150, Intensity: Mild, Exercise limited by: None identified Diet (meal preparation, eat out, water intake, caffeinated beverages, dairy products, fruits and vegetables): in general, a "healthy" diet  , well balanced   Goals    . Exercise 150 min/wk Moderate Activity     Continue swimming at the [place].      Depression Screen PHQ 2/9 Scores 12/26/2019 12/23/2018 12/21/2017 12/03/2016 06/01/2016 08/13/2014  PHQ - 2 Score 0 0 0 0 0 0    Fall Risk Fall Risk  12/26/2019 12/23/2018 12/23/2018 12/21/2017 12/03/2016  Falls in the past year? 1 1 0  No No  Number falls in past yr: 0 0 - - -  Comment - - - - -  Injury with Fall? 0 0 - - -  Risk for fall due to : - - - - -  Follow up Education provided;Falls prevention discussed Education provided;Falls prevention discussed - - -    Any stairs in or around the home? Yes  If so, are there any without handrails? No  Home free of loose throw rugs in walkways, pet beds, electrical cords, etc? Yes  Adequate lighting in your home to reduce risk of falls? Yes   ASSISTIVE DEVICES UTILIZED TO PREVENT FALLS:  Life alert? No  Use of a cane, walker or w/c? No  Grab bars in the bathroom? No  Shower chair or bench in shower? No  Elevated toilet seat or a handicapped toilet? No    Cognitive Function: Ad8 score reviewed for issues:  Issues making decisions:no  Less interest in hobbies / activities:no  Repeats questions, stories (family complaining):no  Trouble using ordinary gadgets (microwave, computer, phone):no  Forgets the month or year: no  Mismanaging finances: no  Remembering appts:no  Daily problems with thinking and/or memory:no Ad8 score is=0     MMSE - Mini Mental State Exam 06/01/2016  Orientation to time 5  Orientation to Place  5  Registration 3  Attention/ Calculation 5  Recall 3  Language- name 2 objects 2  Language- repeat 1  Language- follow 3 step command 3  Language- read & follow direction 1  Write a sentence 1  Copy design 1  Total score 30        Immunizations Immunization History  Administered Date(s) Administered  . Fluad Quad(high Dose 65+) 02/16/2019  . Influenza Whole 02/07/2008, 02/01/2009  . Influenza, High Dose Seasonal PF 01/07/2018  . Influenza,inj,Quad PF,6+ Mos 01/24/2013, 01/01/2014  . Influenza-Unspecified 02/23/2015, 03/04/2016  . PFIZER SARS-COV-2 Vaccination 05/27/2019, 06/19/2019  . Pneumococcal Conjugate-13 05/24/2013  . Pneumococcal Polysaccharide-23 02/07/2008, 06/01/2016  . Td 09/28/2007, 01/29/2018  . Zoster 10/19/2007, 05/02/2009  . Zoster Recombinat (Shingrix) 03/20/2018, 06/21/2018    TDAP status: Up to date Flu Vaccine status: Up to date Pneumococcal vaccine status: Up to date Covid-19 vaccine status: Completed vaccines  Qualifies for Shingles Vaccine? Yes   Zostavax completed Yes   Shingrix Completed?: Yes  Screening Tests Health Maintenance  Topic Date Due  . Hepatitis C Screening  Never done  . INFLUENZA VACCINE  12/03/2019  . TETANUS/TDAP  01/30/2028  . COVID-19 Vaccine  Completed  . PNA vac Low Risk Adult  Completed    Health Maintenance  Health Maintenance Due  Topic Date Due  . Hepatitis C Screening  Never done  . INFLUENZA VACCINE  12/03/2019    Colorectal cancer screening: No longer required.   Lung Cancer Screening: (Low Dose CT Chest recommended if Age 65-80 years, 30 pack-year currently smoking OR have quit w/in 15years.) does not qualify.   Lung Cancer Screening Referral: na  Additional Screening:  Vision Screening: Recommended annual ophthalmology exams for early detection of glaucoma and other disorders of the eye.   Community Resource Referral / Chronic Care Management: CRR required this visit?  No   CCM required this  visit?  No      Plan:    Continue to eat heart healthy diet (full of fruits, vegetables, whole grains, lean protein, water--limit salt, fat, and sugar intake) and increase physical activity as tolerated.  Continue doing brain stimulating activities (puzzles, reading, adult coloring  books, staying active) to keep memory sharp.   Bring a copy of your living will and/or healthcare power of attorney to your next office visit.   I have personally reviewed and noted the following in the patient's chart:   . Medical and social history . Use of alcohol, tobacco or illicit drugs  . Current medications and supplements . Functional ability and status . Nutritional status . Physical activity . Advanced directives . List of other physicians . Hospitalizations, surgeries, and ER visits in previous 12 months . Vitals . Screenings to include cognitive, depression, and falls . Referrals and appointments  In addition, I have reviewed and discussed with patient certain preventive protocols, quality metrics, and best practice recommendations. A written personalized care plan for preventive services as well as general preventive health recommendations were provided to patient.   Due to this being a telephonic visit, the after visit summary with patients personalized plan was offered to patient via mail or my-chart.  Patient would like to access on my-chart.   Naaman Plummer [place], [place]   12/26/2019

## 2019-12-26 ENCOUNTER — Other Ambulatory Visit: Payer: Self-pay

## 2019-12-26 ENCOUNTER — Encounter: Payer: Self-pay | Admitting: *Deleted

## 2019-12-26 ENCOUNTER — Ambulatory Visit (INDEPENDENT_AMBULATORY_CARE_PROVIDER_SITE_OTHER): Payer: Medicare Other | Admitting: *Deleted

## 2019-12-26 VITALS — BP 130/70 | Wt 165.0 lb

## 2019-12-26 DIAGNOSIS — Z0000 Encounter for general adult medical examination without abnormal findings: Secondary | ICD-10-CM | POA: Diagnosis not present

## 2019-12-26 NOTE — Patient Instructions (Signed)
Continue to eat heart healthy diet (full of fruits, vegetables, whole grains, lean protein, water--limit salt, fat, and sugar intake) and increase physical activity as tolerated.  Continue doing brain stimulating activities (puzzles, reading, adult coloring books, staying active) to keep memory sharp.   Bring a copy of your living will and/or healthcare power of attorney to your next office visit.   [name] , Thank you for taking time to come for your Medicare Wellness Visit. I appreciate your ongoing commitment to your health goals. Please review the following plan we discussed and let me know if I can assist you in the future.   These are the goals we discussed: Goals    . Exercise 150 min/wk Moderate Activity     Continue swimming at the [place].       This is a list of the screening recommended for you and due dates:  Health Maintenance  Topic Date Due  .  Hepatitis C: One time screening is recommended by Center for Disease Control  (CDC) for  adults born from [birth year] through 1963.   Never done  . Flu Shot  12/03/2019  . Tetanus Vaccine  01/30/2028  . COVID-19 Vaccine  Completed  . Pneumonia vaccines  Completed    Preventive Care 54 Years and Older, Male Preventive care refers to lifestyle choices and visits with your health care provider that can promote health and wellness. This includes:  A yearly physical exam. This is also called an annual well check.  Regular dental and eye exams.  Immunizations.  Screening for certain conditions.  Healthy lifestyle choices, such as diet and exercise. What can I expect for my preventive care visit? Physical exam Your health care provider will check:  Height and weight. These may be used to calculate body mass index (BMI), which is a measurement that tells if you are at a healthy weight.  Heart rate and blood pressure.  Your skin for abnormal spots. Counseling Your health care provider may ask you questions  about:  Alcohol, tobacco, and drug use.  Emotional well-being.  Home and relationship well-being.  Sexual activity.  Eating habits.  History of falls.  Memory and ability to understand (cognition).  Work and work Statistician. What immunizations do I need?  Influenza (flu) vaccine  This is recommended every year. Tetanus, diphtheria, and pertussis (Tdap) vaccine  You may need a Td booster every 10 years. Varicella (chickenpox) vaccine  You may need this vaccine if you have not already been vaccinated. Zoster (shingles) vaccine  You may need this after age 30. Pneumococcal conjugate (PCV13) vaccine  One dose is recommended after age 67. Pneumococcal polysaccharide (PPSV23) vaccine  One dose is recommended after age 83. Measles, mumps, and rubella (MMR) vaccine  You may need at least one dose of MMR if you were born in 1957 or later. You may also need a second dose. Meningococcal conjugate (MenACWY) vaccine  You may need this if you have certain conditions. Hepatitis A vaccine  You may need this if you have certain conditions or if you travel or work in places where you may be exposed to hepatitis A. Hepatitis B vaccine  You may need this if you have certain conditions or if you travel or work in places where you may be exposed to hepatitis B. Haemophilus influenzae type b (Hib) vaccine  You may need this if you have certain conditions. You may receive vaccines as individual doses or as more than one vaccine together in one  shot (combination vaccines). Talk with your health care provider about the risks and benefits of combination vaccines. What tests do I need? Blood tests  Lipid and cholesterol levels. These may be checked every 5 years, or more frequently depending on your overall health.  Hepatitis C test.  Hepatitis B test. Screening  Lung cancer screening. You may have this screening every year starting at age 27 if you have a 30-pack-year history of  smoking and currently smoke or have quit within the past 15 years.  Colorectal cancer screening. All adults should have this screening starting at age 4 and continuing until age 11. Your health care provider may recommend screening at age 1 if you are at increased risk. You will have tests every 1-10 years, depending on your results and the type of screening test.  Prostate cancer screening. Recommendations will vary depending on your family history and other risks.  Diabetes screening. This is done by checking your blood sugar (glucose) after you have not eaten for a while (fasting). You may have this done every 1-3 years.  Abdominal aortic aneurysm (AAA) screening. You may need this if you are a current or former smoker.  Sexually transmitted disease (STD) testing. Follow these instructions at home: Eating and drinking  Eat a diet that includes fresh fruits and vegetables, whole grains, lean protein, and low-fat dairy products. Limit your intake of foods with high amounts of sugar, saturated fats, and salt.  Take vitamin and mineral supplements as recommended by your health care provider.  Do not drink alcohol if your health care provider tells you not to drink.  If you drink alcohol: ? Limit how much you have to 0-2 drinks a day. ? Be aware of how much alcohol is in your drink. In the [place] one drink equals one 12 oz bottle of beer (355 mL), one 5 oz glass of wine (148 mL), or one 1 oz glass of hard liquor (44 mL). Lifestyle  Take daily care of your teeth and gums.  Stay active. Exercise for at least 30 minutes on 5 or more days each week.  Do not use any products that contain nicotine or tobacco, such as cigarettes, e-cigarettes, and chewing tobacco. If you need help quitting, ask your health care provider.  If you are sexually active, practice safe sex. Use a condom or other form of protection to prevent STIs (sexually transmitted infections).  Talk with your health care  provider about taking a low-dose aspirin or statin. What's next?  Visit your health care provider once a year for a well check visit.  Ask your health care provider how often you should have your eyes and teeth checked.  Stay up to date on all vaccines. This information is not intended to replace advice given to you by your health care provider. Make sure you discuss any questions you have with your health care provider. Document Revised: 04/14/2018 Document Reviewed: 04/14/2018 Elsevier Patient Education  2020 [employer].

## 2020-01-08 ENCOUNTER — Other Ambulatory Visit: Payer: Self-pay | Admitting: Family Medicine

## 2020-01-16 ENCOUNTER — Telehealth: Payer: Self-pay | Admitting: Family Medicine

## 2020-01-16 NOTE — Telephone Encounter (Signed)
Yes for medicare to cover we need a more recent visit can be video but cannot be phone the way I understand the rules

## 2020-01-16 NOTE — Telephone Encounter (Signed)
Patient bought a lift chair and is requesting letter of medical necessity for it. He states it is for insurance to help cover the cost.

## 2020-01-16 NOTE — Telephone Encounter (Signed)
Last seen in March.  Do you think we may need a visit?

## 2020-01-17 NOTE — Telephone Encounter (Signed)
Patient will discuss at visit on 02/05/20

## 2020-02-05 ENCOUNTER — Ambulatory Visit: Payer: Medicare Other | Admitting: Family Medicine

## 2020-02-06 ENCOUNTER — Telehealth: Payer: Self-pay

## 2020-02-06 ENCOUNTER — Ambulatory Visit (INDEPENDENT_AMBULATORY_CARE_PROVIDER_SITE_OTHER): Payer: Medicare Other | Admitting: Family Medicine

## 2020-02-06 ENCOUNTER — Other Ambulatory Visit: Payer: Self-pay

## 2020-02-06 VITALS — BP 132/72 | HR 73 | Temp 98.0°F | Resp 12 | Ht 69.0 in | Wt 165.4 lb

## 2020-02-06 DIAGNOSIS — E559 Vitamin D deficiency, unspecified: Secondary | ICD-10-CM

## 2020-02-06 DIAGNOSIS — E782 Mixed hyperlipidemia: Secondary | ICD-10-CM

## 2020-02-06 DIAGNOSIS — R739 Hyperglycemia, unspecified: Secondary | ICD-10-CM

## 2020-02-06 DIAGNOSIS — Z23 Encounter for immunization: Secondary | ICD-10-CM

## 2020-02-06 DIAGNOSIS — M12552 Traumatic arthropathy, left hip: Secondary | ICD-10-CM | POA: Diagnosis not present

## 2020-02-06 DIAGNOSIS — R0600 Dyspnea, unspecified: Secondary | ICD-10-CM

## 2020-02-06 DIAGNOSIS — I10 Essential (primary) hypertension: Secondary | ICD-10-CM | POA: Diagnosis not present

## 2020-02-06 DIAGNOSIS — E039 Hypothyroidism, unspecified: Secondary | ICD-10-CM

## 2020-02-06 NOTE — Assessment & Plan Note (Signed)
supplement and monitor 

## 2020-02-06 NOTE — Patient Instructions (Signed)
Hip Pain The hip is the joint between the upper legs and the lower pelvis. The bones, cartilage, tendons, and muscles of your hip joint support your body and allow you to move around. Hip pain can range from a minor ache to severe pain in one or both of your hips. The pain may be felt on the inside of the hip joint near the groin, or on the outside near the buttocks and upper thigh. You may also have swelling or stiffness in your hip area. Follow these instructions at home: Managing pain, stiffness, and swelling      If directed, put ice on the painful area. To do this: ? Put ice in a plastic bag. ? Place a towel between your skin and the bag. ? Leave the ice on for 20 minutes, 2-3 times a day.  If directed, apply heat to the affected area as often as told by your health care provider. Use the heat source that your health care provider recommends, such as a moist heat pack or a heating pad. ? Place a towel between your skin and the heat source. ? Leave the heat on for 20-30 minutes. ? Remove the heat if your skin turns bright red. This is especially important if you are unable to feel pain, heat, or cold. You may have a greater risk of getting burned. Activity  Do exercises as told by your health care provider.  Avoid activities that cause pain. General instructions   Take over-the-counter and prescription medicines only as told by your health care provider.  Keep a journal of your symptoms. Write down: ? How often you have hip pain. ? The location of your pain. ? What the pain feels like. ? What makes the pain worse.  Sleep with a pillow between your legs on your most comfortable side.  Keep all follow-up visits as told by your health care provider. This is important. Contact a health care provider if:  You cannot put weight on your leg.  Your pain or swelling continues or gets worse after one week.  It gets harder to walk.  You have a fever. Get help right away  if:  You fall.  You have a sudden increase in pain and swelling in your hip.  Your hip is red or swollen or very tender to touch. Summary  Hip pain can range from a minor ache to severe pain in one or both of your hips.  The pain may be felt on the inside of the hip joint near the groin, or on the outside near the buttocks and upper thigh.  Avoid activities that cause pain.  Write down how often you have hip pain, the location of the pain, what makes it worse, and what it feels like. This information is not intended to replace advice given to you by your health care provider. Make sure you discuss any questions you have with your health care provider. Document Revised: 09/05/2018 Document Reviewed: 09/05/2018 Elsevier Patient Education  2020 [employer] -- 

## 2020-02-06 NOTE — Assessment & Plan Note (Signed)
hgba1c acceptable, minimize simple carbs. Increase exercise as tolerated.  

## 2020-02-06 NOTE — Assessment & Plan Note (Signed)
On Levothyroxine, continue to monitor 

## 2020-02-06 NOTE — Telephone Encounter (Signed)
[employer] Orders for Wheelchair has been faxed with confirmation. 02/06/20

## 2020-02-06 NOTE — Assessment & Plan Note (Signed)
Encouraged heart healthy diet, increase exercise, avoid trans fats, consider a krill oil cap daily 

## 2020-02-06 NOTE — Assessment & Plan Note (Signed)
Well controlled, no changes to meds. Encouraged heart healthy diet such as the DASH diet and exercise as tolerated.  °

## 2020-02-07 ENCOUNTER — Encounter: Payer: Self-pay | Admitting: Family Medicine

## 2020-02-07 NOTE — Progress Notes (Signed)
Subjective:    Patient ID: Micheal Mcintyre, male    DOB: 1940-10-05, 79 y.o.   MRN: [id number]  Chief Complaint  Patient presents with  . Follow-up    discuss note for medical necessity    HPI Patient is in today for follow up on chronic medical concerns. He is doing well most days. No recent febrile illness or hospitalization. He has chronic pain in shoulder and left hip dating back to serious injury decades ago. No recent injury or trauma. He notes some fatigue but has no other acute concerns. Denies CP/palp/SOB/HA/congestion/fevers/GI or GU c/o. Taking meds as prescribed  Past Medical History:  Diagnosis Date  . Arthritis   . GERD (gastroesophageal reflux disease)   . Hypertension   . Medicare annual wellness visit, subsequent 08/19/2014  . Osteoarthritis 09/21/2007   Qualifier: Diagnosis of  By: Niel Hummer MD, [facility] 06/07/2016  . Right shoulder pain 12/03/2016    Past Surgical History:  Procedure Laterality Date  . CHOLECYSTECTOMY    . HIP SURGERY    . JOINT REPLACEMENT    . TOTAL HIP ARTHROPLASTY      Family History  Problem Relation Age of Onset  . Heart attack Father 20       deceased  . Diabetes Maternal Uncle        maternal grandfather  . Hypertension Neg Hx   . Breast cancer Neg Hx   . Colon cancer Neg Hx   . Prostate cancer Neg Hx     Social History   Socioeconomic History  . Marital status: Married    Spouse name: Not on file  . Number of children: Not on file  . Years of education: Not on file  . Highest education level: Not on file  Occupational History  . Not on file  Tobacco Use  . Smoking status: Never Smoker  . Smokeless tobacco: Never Used  Vaping Use  . Vaping Use: Never used  Substance and Sexual Activity  . Alcohol use: Yes    Comment: occ  . Drug use: No  . Sexual activity: Not Currently  Other Topics Concern  . Not on file  Social History Narrative  . Not on file   Social Determinants of Health    Financial Resource Strain: Low Risk   . Difficulty of Paying Living Expenses: Not hard at all  Food Insecurity: No Food Insecurity  . Worried About Charity fundraiser in the Last Year: Never true  . Ran Out of Food in the Last Year: Never true  Transportation Needs: No Transportation Needs  . Lack of Transportation (Medical): No  . Lack of Transportation (Non-Medical): No  Physical Activity:   . Days of Exercise per Week: Not on file  . Minutes of Exercise per Session: Not on file  Stress:   . Feeling of Stress : Not on file  Social Connections:   . Frequency of Communication with Friends and Family: Not on file  . Frequency of Social Gatherings with Friends and Family: Not on file  . Attends Religious Services: Not on file  . Active Member of Clubs or Organizations: Not on file  . Attends Archivist Meetings: Not on file  . Marital Status: Not on file  Intimate Partner Violence:   . Fear of Current or Ex-Partner: Not on file  . Emotionally Abused: Not on file  . Physically Abused: Not on file  . Sexually Abused: Not  on file    Outpatient Medications Prior to Visit  Medication Sig Dispense Refill  . Krill Oil 300 MG CAPS Take 1 capsule by mouth daily.    . rosuvastatin (CRESTOR) 5 MG tablet TAKE 1 TABLET(5 MG) BY MOUTH DAILY 30 tablet 5  . sildenafil (VIAGRA) 50 MG tablet Take 1 tablet (50 mg total) by mouth daily as needed. 10 tablet 3  . telmisartan (MICARDIS) 80 MG tablet TAKE 1/2 TABLET(40 MG) BY MOUTH TWICE DAILY 180 tablet 1   No facility-administered medications prior to visit.    No Known Allergies  Review of Systems  Constitutional: Negative for fever and malaise/fatigue.  HENT: Negative for congestion.   Eyes: Negative for blurred vision.  Respiratory: Negative for shortness of breath.   Cardiovascular: Negative for chest pain, palpitations and leg swelling.  Gastrointestinal: Negative for abdominal pain, blood in stool and nausea.   Genitourinary: Negative for dysuria and frequency.  Musculoskeletal: Positive for joint pain. Negative for falls.  Skin: Negative for rash.  Neurological: Negative for dizziness, loss of consciousness and headaches.  Endo/Heme/Allergies: Negative for environmental allergies.  Psychiatric/Behavioral: Negative for depression. The patient is not nervous/anxious.        Objective:    Physical Exam Vitals and nursing note reviewed.  Constitutional:      General: He is not in acute distress.    Appearance: He is well-developed.  HENT:     Head: Normocephalic and atraumatic.     Nose: Nose normal.  Eyes:     General:        Right eye: No discharge.        Left eye: No discharge.  Cardiovascular:     Rate and Rhythm: Normal rate and regular rhythm.     Heart sounds: No murmur heard.   Pulmonary:     Effort: Pulmonary effort is normal.     Breath sounds: Normal breath sounds.  Abdominal:     General: Bowel sounds are normal.     Palpations: Abdomen is soft.     Tenderness: There is no abdominal tenderness.  Musculoskeletal:     Cervical back: Normal range of motion and neck supple.  Skin:    General: Skin is warm and dry.  Neurological:     Mental Status: He is alert and oriented to person, place, and time.     BP 132/72 (BP Location: Left Arm, Patient Position: Sitting, Cuff Size: Small)   Pulse 73   Temp 98 F (36.7 C) (Oral)   Resp 12   Ht 5\' 9"  (1.753 m)   Wt 165 lb 6.4 oz (75 kg)   SpO2 99%   BMI 24.43 kg/m  Wt Readings from Last 3 Encounters:  02/06/20 165 lb 6.4 oz (75 kg)  12/26/19 165 lb (74.8 kg)  08/01/19 165 lb 3.2 oz (74.9 kg)    Diabetic Foot Exam - Simple   No data filed     Lab Results  Component Value Date   WBC 6.7 02/06/2020   HGB 14.9 02/06/2020   HCT 43.8 02/06/2020   PLT 163 02/06/2020   GLUCOSE 95 02/06/2020   CHOL 141 02/06/2020   TRIG 82 02/06/2020   HDL 57 02/06/2020   LDLCALC 68 02/06/2020   ALT 14 02/06/2020   AST 11  02/06/2020   NA 141 02/06/2020   K 5.0 02/06/2020   CL 105 02/06/2020   CREATININE 1.09 02/06/2020   BUN 20 02/06/2020   CO2 26 02/06/2020   TSH  1.31 02/06/2020   PSA 0.96 07/08/2018   HGBA1C 6.1 (H) 02/06/2020    Lab Results  Component Value Date   TSH 1.31 02/06/2020   Lab Results  Component Value Date   WBC 6.7 02/06/2020   HGB 14.9 02/06/2020   HCT 43.8 02/06/2020   MCV 92.4 02/06/2020   PLT 163 02/06/2020   Lab Results  Component Value Date   NA 141 02/06/2020   K 5.0 02/06/2020   CO2 26 02/06/2020   GLUCOSE 95 02/06/2020   BUN 20 02/06/2020   CREATININE 1.09 02/06/2020   BILITOT 0.9 02/06/2020   ALKPHOS 81 08/01/2019   AST 11 02/06/2020   ALT 14 02/06/2020   PROT 6.9 02/06/2020   ALBUMIN 4.2 08/01/2019   CALCIUM 9.7 02/06/2020   GFR 72.58 08/01/2019   Lab Results  Component Value Date   CHOL 141 02/06/2020   Lab Results  Component Value Date   HDL 57 02/06/2020   Lab Results  Component Value Date   LDLCALC 68 02/06/2020   Lab Results  Component Value Date   TRIG 82 02/06/2020   Lab Results  Component Value Date   CHOLHDL 2.5 02/06/2020   Lab Results  Component Value Date   HGBA1C 6.1 (H) 02/06/2020       Assessment & Plan:   Problem List Items Addressed This Visit    Hypothyroidism    On Levothyroxine, continue to monitor      Hyperlipidemia, mixed    Encouraged heart healthy diet, increase exercise, avoid trans fats, consider a krill oil cap daily      Relevant Orders   Lipid panel (Completed)   Essential hypertension    Well controlled, no changes to meds. Encouraged heart healthy diet such as the DASH diet and exercise as tolerated.       Relevant Orders   CBC (Completed)   Comprehensive metabolic panel (Completed)   TSH (Completed)   Hyperglycemia    hgba1c acceptable, minimize simple carbs. Increase exercise as tolerated.       Relevant Orders   Hemoglobin A1c (Completed)   Vitamin D deficiency    supplement and  monitor      Relevant Orders   VITAMIN D 25 Hydroxy (Vit-D Deficiency, Fractures) (Completed)    Other Visit Diagnoses    Traumatic arthritis of hip, left    -  Primary   Relevant Orders   For home use only DME Other see comment   Dyspnea, unspecified type       Relevant Orders   ECHOCARDIOGRAM COMPLETE   Influenza vaccine administered       Relevant Orders   Flu Vaccine QUAD High Dose(Fluad) (Completed)      I am having [facility] maintain his Krill Oil, sildenafil, telmisartan, and rosuvastatin.  No orders of the defined types were placed in this encounter.    Penni Homans, MD

## 2020-02-08 NOTE — Telephone Encounter (Signed)
PSA has been added per [facility] CSR.

## 2020-02-09 LAB — LIPID PANEL
Cholesterol: 141 mg/dL (ref <200)
HDL: 57 mg/dL (ref >=40)
LDL Cholesterol (Calc): 68 mg/dL (calc)
Non-HDL Cholesterol (Calc): 84 mg/dL (calc) (ref <130)
Total CHOL/HDL Ratio: 2.5 (calc) (ref <5.0)
Triglycerides: 82 mg/dL (ref <150)

## 2020-02-09 LAB — COMPREHENSIVE METABOLIC PANEL
AG Ratio: 1.8 (calc) (ref 1.0–2.5)
ALT: 14 U/L (ref 9–46)
AST: 11 U/L (ref 10–35)
Albumin: 4.4 g/dL (ref 3.6–5.1)
Alkaline phosphatase (APISO): 88 U/L (ref 35–144)
BUN: 20 mg/dL (ref 7–25)
CO2: 26 mmol/L (ref 20–32)
Calcium: 9.7 mg/dL (ref 8.6–10.3)
Chloride: 105 mmol/L (ref 98–110)
Creat: 1.09 mg/dL (ref 0.70–1.18)
Globulin: 2.5 g/dL (calc) (ref 1.9–3.7)
Glucose, Bld: 95 mg/dL (ref 65–99)
Potassium: 5 mmol/L (ref 3.5–5.3)
Sodium: 141 mmol/L (ref 135–146)
Total Bilirubin: 0.9 mg/dL (ref 0.2–1.2)
Total Protein: 6.9 g/dL (ref 6.1–8.1)

## 2020-02-09 LAB — CBC
HCT: 43.8 % (ref 38.5–50.0)
Hemoglobin: 14.9 g/dL (ref 13.2–17.1)
MCH: 31.4 pg (ref 27.0–33.0)
MCHC: 34 g/dL (ref 32.0–36.0)
MCV: 92.4 fL (ref 80.0–100.0)
MPV: 11.1 fL (ref 7.5–12.5)
Platelets: 163 10*3/uL (ref 140–400)
RBC: 4.74 10*6/uL (ref 4.20–5.80)
RDW: 11.9 % (ref 11.0–15.0)
WBC: 6.7 10*3/uL (ref 3.8–10.8)

## 2020-02-09 LAB — VITAMIN D 25 HYDROXY (VIT D DEFICIENCY, FRACTURES): Vit D, 25-Hydroxy: 28 ng/mL — ABNORMAL LOW (ref 30–100)

## 2020-02-09 LAB — HEMOGLOBIN A1C
Hgb A1c MFr Bld: 6.1 % of total Hgb — ABNORMAL HIGH (ref <5.7)
Mean Plasma Glucose: 128 (calc)
eAG (mmol/L): 7.1 (calc)

## 2020-02-09 LAB — TSH: TSH: 1.31 mIU/L (ref 0.40–4.50)

## 2020-02-09 LAB — TEST AUTHORIZATION

## 2020-02-09 LAB — PSA: PSA: 0.7 ng/mL (ref <=4.0)

## 2020-03-06 ENCOUNTER — Other Ambulatory Visit: Payer: Self-pay

## 2020-03-06 ENCOUNTER — Ambulatory Visit (HOSPITAL_BASED_OUTPATIENT_CLINIC_OR_DEPARTMENT_OTHER)
Admission: RE | Admit: 2020-03-06 | Discharge: 2020-03-06 | Disposition: A | Discharge status: Home / Self Care | Payer: Medicare Other | Source: Ambulatory Visit | Attending: Family Medicine | Admitting: Family Medicine

## 2020-03-06 DIAGNOSIS — R0600 Dyspnea, unspecified: Secondary | ICD-10-CM | POA: Diagnosis present

## 2020-03-06 LAB — ECHOCARDIOGRAM COMPLETE
Area-P 1/2: 3.93 cm2
S' Lateral: 2.12 cm

## 2020-08-05 ENCOUNTER — Other Ambulatory Visit: Payer: Self-pay | Admitting: Family Medicine

## 2020-08-06 ENCOUNTER — Other Ambulatory Visit: Payer: Self-pay

## 2020-08-06 ENCOUNTER — Ambulatory Visit (INDEPENDENT_AMBULATORY_CARE_PROVIDER_SITE_OTHER): Payer: Medicare Other | Admitting: Family Medicine

## 2020-08-06 DIAGNOSIS — I10 Essential (primary) hypertension: Secondary | ICD-10-CM

## 2020-08-06 DIAGNOSIS — R739 Hyperglycemia, unspecified: Secondary | ICD-10-CM

## 2020-08-06 DIAGNOSIS — E782 Mixed hyperlipidemia: Secondary | ICD-10-CM

## 2020-08-06 DIAGNOSIS — E039 Hypothyroidism, unspecified: Secondary | ICD-10-CM | POA: Diagnosis not present

## 2020-08-06 DIAGNOSIS — E559 Vitamin D deficiency, unspecified: Secondary | ICD-10-CM | POA: Diagnosis not present

## 2020-08-06 LAB — COMPREHENSIVE METABOLIC PANEL
ALT: 14 U/L (ref 0–53)
AST: 16 U/L (ref 0–37)
Albumin: 4.3 g/dL (ref 3.5–5.2)
Alkaline Phosphatase: 91 U/L (ref 39–117)
BUN: 20 mg/dL (ref 6–23)
CO2: 27 mEq/L (ref 19–32)
Calcium: 9.5 mg/dL (ref 8.4–10.5)
Chloride: 107 mEq/L (ref 96–112)
Creatinine, Ser: 1.06 mg/dL (ref 0.40–1.50)
GFR: 67.76 mL/min (ref >60.00)
Glucose, Bld: 104 mg/dL — ABNORMAL HIGH (ref 70–99)
Potassium: 4.4 mEq/L (ref 3.5–5.1)
Sodium: 141 mEq/L (ref 135–145)
Total Bilirubin: 0.7 mg/dL (ref 0.2–1.2)
Total Protein: 6.8 g/dL (ref 6.0–8.3)

## 2020-08-06 LAB — LIPID PANEL
Cholesterol: 130 mg/dL (ref 0–200)
HDL: 58.1 mg/dL (ref >39.00)
LDL Cholesterol: 59 mg/dL (ref 0–99)
NonHDL: 71.79
Total CHOL/HDL Ratio: 2
Triglycerides: 64 mg/dL (ref 0.0–149.0)
VLDL: 12.8 mg/dL (ref 0.0–40.0)

## 2020-08-06 LAB — HEMOGLOBIN A1C: Hgb A1c MFr Bld: 6.3 % (ref 4.6–6.5)

## 2020-08-06 LAB — VITAMIN D 25 HYDROXY (VIT D DEFICIENCY, FRACTURES): VITD: 30.15 ng/mL (ref 30.00–100.00)

## 2020-08-06 NOTE — Assessment & Plan Note (Signed)
On Levothyroxine, continue to monitor 

## 2020-08-06 NOTE — Assessment & Plan Note (Signed)
Encouraged heart healthy diet, increase exercise, avoid trans fats, consider a krill oil cap daily 

## 2020-08-06 NOTE — Assessment & Plan Note (Signed)
Well controlled, no changes to meds. Encouraged heart healthy diet such as the DASH diet and exercise as tolerated.  °

## 2020-08-06 NOTE — Assessment & Plan Note (Signed)
hgba1c acceptable, minimize simple carbs. Increase exercise as tolerated.  

## 2020-08-06 NOTE — Assessment & Plan Note (Signed)
Supplement and monitor 

## 2020-08-06 NOTE — Progress Notes (Signed)
Patient ID: Micheal Mcintyre, male    DOB: 1941/05/04  Age: 80 y.o. MRN: [id number]    Subjective:  Subjective  HPI Micheal Mcintyre presents for office visit today. He reports he is feeling well and does not complain of any recent sicknesses. He denies any chest pain, SOB, fever, abdominal pain, cough, chills, sore throat, dysuria, urinary incontinence, back pain, HA, or N/VD. He expresses interests in the different kind of treatments for COVID-19 that are offered, but he still took the vaccination and the booster shot. He reports that his family is doing well. He states that he has been taking 2000 units of vitamin D, but he has not been taking it as consistently. He reports that he is physically active and still works out.   Review of Systems  Constitutional: Negative for chills, fatigue and fever.  HENT: Negative for congestion, rhinorrhea, sinus pressure, sinus pain and sore throat.   Eyes: Negative for pain.  Respiratory: Negative for cough and shortness of breath.   Cardiovascular: Negative for chest pain, palpitations and leg swelling.  Gastrointestinal: Negative for abdominal pain, blood in stool, diarrhea, nausea and vomiting.  Genitourinary: Negative for flank pain, frequency and penile pain.  Musculoskeletal: Negative for back pain.  Neurological: Negative for headaches.    History Past Medical History:  Diagnosis Date  . Arthritis   . GERD (gastroesophageal reflux disease)   . Hypertension   . Medicare annual wellness visit, subsequent 08/19/2014  . 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 Right shoulder pain 12/03/2016    He has a past surgical history that includes Cholecystectomy; Hip surgery; Total hip arthroplasty; and Joint replacement.   His family history includes Diabetes in his maternal uncle; Heart attack (age of onset: 21) in his father.He reports that he has never smoked. He has never  used smokeless tobacco. He reports current alcohol use. He reports that he does not use drugs.  Current Outpatient Medications on File Prior to Visit  Medication Sig Dispense Refill  . Krill Oil 300 MG CAPS Take 1 capsule by mouth daily.    . rosuvastatin (CRESTOR) 5 MG tablet Take 1 tablet (5 mg total) by mouth daily. 90 tablet 1  . sildenafil (VIAGRA) 50 MG tablet Take 1 tablet (50 mg total) by mouth daily as needed. 10 tablet 3  . telmisartan (MICARDIS) 80 MG tablet TAKE 1/2 TABLET(40 MG) BY MOUTH TWICE DAILY 180 tablet 1   No current facility-administered medications on file prior to visit.     Objective:  Objective  Physical Exam Constitutional:      General: He is not in acute distress.    Appearance: Normal appearance. He is not ill-appearing or toxic-appearing.  HENT:     Head: Normocephalic and atraumatic.     Right Ear: Tympanic membrane, ear canal and external ear normal.     Left Ear: Tympanic membrane, ear canal and external ear normal.     Nose: No congestion or rhinorrhea.  Eyes:     Extraocular Movements: Extraocular movements intact.     Pupils: Pupils are equal, round, and reactive to light.  Cardiovascular:     Rate and Rhythm: Normal rate and regular rhythm.     Pulses: Normal pulses.     Heart sounds: Normal heart sounds. No murmur heard.   Pulmonary:     Effort: Pulmonary effort is normal. No respiratory distress.  Breath sounds: Normal breath sounds. No wheezing, rhonchi or rales.  Abdominal:     General: Bowel sounds are normal.     Palpations: Abdomen is soft. There is no mass.     Tenderness: There is no abdominal tenderness. There is no guarding.     Hernia: No hernia is present.  Musculoskeletal:        General: Normal range of motion.     Cervical back: Normal range of motion and neck supple.  Skin:    General: Skin is warm and dry.  Neurological:     Mental Status: He is alert and oriented to person, place, and time.  Psychiatric:         Behavior: Behavior normal.    BP 130/76 (BP Location: Right Arm, Cuff Size: Normal)   Pulse 69   Temp 97.9 F (36.6 C) (Oral)   Resp 12   Ht 5\' 9"  (1.753 m)   Wt 167 lb 3.2 oz (75.8 kg)   SpO2 95%   BMI 24.69 kg/m  Wt Readings from Last 3 Encounters:  08/06/20 167 lb 3.2 oz (75.8 kg)  02/06/20 165 lb 6.4 oz (75 kg)  12/26/19 165 lb (74.8 kg)     Lab Results  Component Value Date   WBC 6.7 02/06/2020   HGB 14.9 02/06/2020   HCT 43.8 02/06/2020   PLT 163 02/06/2020   GLUCOSE 104 (H) 08/06/2020   CHOL 130 08/06/2020   TRIG 64.0 08/06/2020   HDL 58.10 08/06/2020   LDLCALC 59 08/06/2020   ALT 14 08/06/2020   AST 16 08/06/2020   NA 141 08/06/2020   K 4.4 08/06/2020   CL 107 08/06/2020   CREATININE 1.06 08/06/2020   BUN 20 08/06/2020   CO2 27 08/06/2020   TSH 1.31 02/06/2020   PSA 0.70 02/06/2020   HGBA1C 6.3 08/06/2020    ECHOCARDIOGRAM COMPLETE  Result Date: 03/06/2020    ECHOCARDIOGRAM REPORT   Patient Name:   Micheal Mcintyre Date of Exam: 03/06/2020 Medical Rec #:  [id number]   Height:       69.0 in Accession #:    [id number]  Weight:       165.4 lb Date of Birth:  1940-10-30  BSA:          1.906 m Patient Age:    71 years    BP:           139/72 mmHg Patient Gender: M           HR:           75 bpm. Exam Location:  [facility] Procedure: 2D Echo, Cardiac Doppler and Color Doppler Indications:    R06.9 DOE  History:        Patient has no prior history of Echocardiogram examinations.                 Signs/Symptoms:Dyspnea; Risk Factors:Hypertension, Dyslipidemia                 and Non-Smoker.  Sonographer:    Geradine Girt Referring Phys: [place]  1. Left ventricular ejection fraction, by estimation, is 60 to 65%. The left ventricle has normal function. The left ventricle has no regional wall motion abnormalities.  2. Right ventricular systolic function is normal. The right ventricular size is normal. There is normal pulmonary artery systolic pressure.  3.  The mitral valve is normal in structure. No evidence of mitral valve regurgitation. No evidence of mitral  stenosis.  4. The aortic valve is normal in structure. Aortic valve regurgitation is not visualized. No aortic stenosis is present.  5. The inferior vena cava is normal in size with greater than 50% respiratory variability, suggesting right atrial pressure of 3 mmHg. FINDINGS  Left Ventricle: Left ventricular ejection fraction, by estimation, is 60 to 65%. The left ventricle has normal function. The left ventricle has no regional wall motion abnormalities. The left ventricular internal cavity size was normal in size. There is  no left ventricular hypertrophy. Left ventricular diastolic parameters were normal. Right Ventricle: The right ventricular size is normal. No increase in right ventricular wall thickness. Right ventricular systolic function is normal. There is normal pulmonary artery systolic pressure. The tricuspid regurgitant velocity is 2.28 m/s, and  with an assumed right atrial pressure of 3 mmHg, the estimated right ventricular systolic pressure is 81.8 mmHg. Left Atrium: Left atrial size was normal in size. Right Atrium: Right atrial size was normal in size. Pericardium: There is no evidence of pericardial effusion. Mitral Valve: The mitral valve is normal in structure. No evidence of mitral valve regurgitation. No evidence of mitral valve stenosis. Tricuspid Valve: The tricuspid valve is normal in structure. Tricuspid valve regurgitation is mild . No evidence of tricuspid stenosis. Aortic Valve: The aortic valve is normal in structure. Aortic valve regurgitation is not visualized. No aortic stenosis is present. Pulmonic Valve: The pulmonic valve was normal in structure. Pulmonic valve regurgitation is not visualized. No evidence of pulmonic stenosis. Aorta: The aortic root is normal in size and structure. Venous: The inferior vena cava is normal in size with greater than 50% respiratory  variability, suggesting right atrial pressure of 3 mmHg. IAS/Shunts: No atrial level shunt detected by color flow Doppler.  LEFT VENTRICLE PLAX 2D LVIDd:         3.95 cm  Diastology LVIDs:         2.12 cm  LV e' medial:    5.11 cm/s LV PW:         0.71 cm  LV E/e' medial:  13.3 LV IVS:        1.51 cm  LV e' lateral:   8.27 cm/s LVOT diam:     1.90 cm  LV E/e' lateral: 8.2 LV SV:         55 LV SV Index:   29 LVOT Area:     2.84 cm  RIGHT VENTRICLE RV S prime:     12.80 cm/s TAPSE (M-mode): 2.0 cm LEFT ATRIUM             Index       RIGHT ATRIUM           Index LA diam:        3.30 cm 1.73 cm/m  RA Area:     10.60 cm LA Vol (A2C):   24.5 ml 12.86 ml/m RA Volume:   21.40 ml  11.23 ml/m LA Vol (A4C):   40.2 ml 21.09 ml/m LA Biplane Vol: 31.8 ml 16.69 ml/m  AORTIC VALVE LVOT Vmax:   93.30 cm/s LVOT Vmean:  61.600 cm/s LVOT VTI:    0.194 m  AORTA Ao Root diam: 2.80 cm Ao Asc diam:  3.20 cm MITRAL VALVE               TRICUSPID VALVE MV Area (PHT): 3.93 cm    TR Peak grad:   20.8 mmHg MV Decel Time: 193 msec    TR Vmax:  228.00 cm/s MV E velocity: 68.10 cm/s MV A velocity: 67.30 cm/s  SHUNTS MV E/A ratio:  1.01        Systemic VTI:  0.19 m                            Systemic Diam: 1.90 cm Jyl Heinz MD Electronically signed by Jyl Heinz MD Signature Date/Time: 03/06/2020/10:12:47 AM    Final      Assessment & Plan:  Plan    No orders of the defined types were placed in this encounter.   Problem List Items Addressed This Visit    Hypothyroidism    On Levothyroxine, continue to monitor      Hyperlipidemia, mixed    Encouraged heart healthy diet, increase exercise, avoid trans fats, consider a krill oil cap daily      Relevant Orders   Lipid panel (Completed)   Essential hypertension    Well controlled, no changes to meds. Encouraged heart healthy diet such as the DASH diet and exercise as tolerated.       Hyperglycemia    hgba1c acceptable, minimize simple carbs. Increase  exercise as tolerated.       Relevant Orders   Hemoglobin A1c (Completed)   Comprehensive metabolic panel (Completed)   Vitamin D deficiency    Supplement and monitor      Relevant Orders   VITAMIN D 25 Hydroxy (Vit-D Deficiency, Fractures) (Completed)      Follow-up: Return in about 6 months (around 02/05/2021) for annual exam.   I,David Hanna,acting as a scribe for Penni Homans, MD.,have documented all relevant documentation on the behalf of Penni Homans, MD,as directed by  Penni Homans, MD while in the presence of Penni Homans, MD.  I, [name], MD personally performed the services described in this documentation. All medical record entries made by the scribe were at my direction and in my presence. I have reviewed the chart and agree that the record reflects my personal performance and is accurate and complete

## 2020-08-06 NOTE — Patient Instructions (Addendum)
Vitamin D3 2000 IU daily  [employer] Multivitamin with minerals  Hypertension, Adult High blood pressure (hypertension) is when the force of blood pumping through the arteries is too strong. The arteries are the blood vessels that carry blood from the heart throughout the body. Hypertension forces the heart to work harder to pump blood and may cause arteries to become narrow or stiff. Untreated or uncontrolled hypertension can cause a heart attack, heart failure, a stroke, kidney disease, and other problems. A blood pressure reading consists of a higher number over a lower number. Ideally, your blood pressure should be below 120/80. The first ("top") number is called the systolic pressure. It is a measure of the pressure in your arteries as your heart beats. The second ("bottom") number is called the diastolic pressure. It is a measure of the pressure in your arteries as the heart relaxes. What are the causes? The exact cause of this condition is not known. There are some conditions that result in or are related to high blood pressure. What increases the risk? Some risk factors for high blood pressure are under your control. The following factors may make you more likely to develop this condition:  Smoking.  Having type 2 diabetes mellitus, high cholesterol, or both.  Not getting enough exercise or physical activity.  Being overweight.  Having too much fat, sugar, calories, or salt (sodium) in your diet.  Drinking too much alcohol. Some risk factors for high blood pressure may be difficult or impossible to change. Some of these factors include:  Having chronic kidney disease.  Having a family history of high blood pressure.  Age. Risk increases with age.  Race. You may be at higher risk if you are African American.  Gender. Men are at higher risk than women before age 34. After age 78, women are at higher risk than men.  Having obstructive sleep apnea.  Stress. What are the  signs or symptoms? High blood pressure may not cause symptoms. Very high blood pressure (hypertensive crisis) may cause:  Headache.  Anxiety.  Shortness of breath.  Nosebleed.  Nausea and vomiting.  Vision changes.  Severe chest pain.  Seizures. How is this diagnosed? This condition is diagnosed by measuring your blood pressure while you are seated, with your arm resting on a flat surface, your legs uncrossed, and your feet flat on the floor. The cuff of the blood pressure monitor will be placed directly against the skin of your upper arm at the level of your heart. It should be measured at least twice using the same arm. Certain conditions can cause a difference in blood pressure between your right and left arms. Certain factors can cause blood pressure readings to be lower or higher than normal for a short period of time:  When your blood pressure is higher when you are in a health care provider's office than when you are at home, this is called white coat hypertension. Most people with this condition do not need medicines.  When your blood pressure is higher at home than when you are in a health care provider's office, this is called masked hypertension. Most people with this condition may need medicines to control blood pressure. If you have a high blood pressure reading during one visit or you have normal blood pressure with other risk factors, you may be asked to:  Return on a different day to have your blood pressure checked again.  Monitor your blood pressure at home for 1 week or longer.  If you are diagnosed with hypertension, you may have other blood or imaging tests to help your health care provider understand your overall risk for other conditions. How is this treated? This condition is treated by making healthy lifestyle changes, such as eating healthy foods, exercising more, and reducing your alcohol intake. Your health care provider may prescribe medicine if lifestyle  changes are not enough to get your blood pressure under control, and if:  Your systolic blood pressure is above 130.  Your diastolic blood pressure is above 80. Your personal target blood pressure may vary depending on your medical conditions, your age, and other factors. Follow these instructions at home: Eating and drinking  Eat a diet that is high in fiber and potassium, and low in sodium, added sugar, and fat. An example eating plan is called the DASH (Dietary Approaches to Stop Hypertension) diet. To eat this way: ? Eat plenty of fresh fruits and vegetables. Try to fill one half of your plate at each meal with fruits and vegetables. ? Eat whole grains, such as whole-wheat pasta, brown rice, or whole-grain bread. Fill about one fourth of your plate with whole grains. ? Eat or drink low-fat dairy products, such as skim milk or low-fat yogurt. ? Avoid fatty cuts of meat, processed or cured meats, and poultry with skin. Fill about one fourth of your plate with lean proteins, such as fish, chicken without skin, beans, eggs, or tofu. ? Avoid pre-made and processed foods. These tend to be higher in sodium, added sugar, and fat.  Reduce your daily sodium intake. Most people with hypertension should eat less than 1,500 mg of sodium a day.  Do not drink alcohol if: ? Your health care provider tells you not to drink. ? You are pregnant, may be pregnant, or are planning to become pregnant.  If you drink alcohol: ? Limit how much you use to:  0-1 drink a day for women.  0-2 drinks a day for men. ? Be aware of how much alcohol is in your drink. In the [place] one drink equals one 12 oz bottle of beer (355 mL), one 5 oz glass of wine (148 mL), or one 1 oz glass of hard liquor (44 mL).   Lifestyle  Work with your health care provider to maintain a healthy body weight or to lose weight. Ask what an ideal weight is for you.  Get at least 30 minutes of exercise most days of the week. Activities may  include walking, swimming, or biking.  Include exercise to strengthen your muscles (resistance exercise), such as Pilates or lifting weights, as part of your weekly exercise routine. Try to do these types of exercises for 30 minutes at least 3 days a week.  Do not use any products that contain nicotine or tobacco, such as cigarettes, e-cigarettes, and chewing tobacco. If you need help quitting, ask your health care provider.  Monitor your blood pressure at home as told by your health care provider.  Keep all follow-up visits as told by your health care provider. This is important.   Medicines  Take over-the-counter and prescription medicines only as told by your health care provider. Follow directions carefully. Blood pressure medicines must be taken as prescribed.  Do not skip doses of blood pressure medicine. Doing this puts you at risk for problems and can make the medicine less effective.  Ask your health care provider about side effects or reactions to medicines that you should watch for. Contact a health care  provider if you:  Think you are having a reaction to a medicine you are taking.  Have headaches that keep coming back (recurring).  Feel dizzy.  Have swelling in your ankles.  Have trouble with your vision. Get help right away if you:  Develop a severe headache or confusion.  Have unusual weakness or numbness.  Feel faint.  Have severe pain in your chest or abdomen.  Vomit repeatedly.  Have trouble breathing. Summary  Hypertension is when the force of blood pumping through your arteries is too strong. If this condition is not controlled, it may put you at risk for serious complications.  Your personal target blood pressure may vary depending on your medical conditions, your age, and other factors. For most people, a normal blood pressure is less than 120/80.  Hypertension is treated with lifestyle changes, medicines, or a combination of both. Lifestyle changes  include losing weight, eating a healthy, low-sodium diet, exercising more, and limiting alcohol. This information is not intended to replace advice given to you by your health care provider. Make sure you discuss any questions you have with your health care provider. Document Revised: 12/29/2017 Document Reviewed: 12/29/2017 Elsevier Patient Education  2021 [employer].

## 2020-10-10 ENCOUNTER — Other Ambulatory Visit: Payer: Self-pay | Admitting: Family Medicine

## 2021-01-08 ENCOUNTER — Ambulatory Visit (INDEPENDENT_AMBULATORY_CARE_PROVIDER_SITE_OTHER): Payer: Medicare Other

## 2021-01-08 VITALS — Ht 69.0 in | Wt 162.0 lb

## 2021-01-08 DIAGNOSIS — Z0000 Encounter for general adult medical examination without abnormal findings: Secondary | ICD-10-CM | POA: Diagnosis not present

## 2021-01-08 NOTE — Patient Instructions (Signed)
Micheal Mcintyre , Thank you for taking time to complete your Medicare Wellness Visit. I appreciate your ongoing commitment to your health goals. Please review the following plan we discussed and let me know if I can assist you in the future.   Screening recommendations/referrals: Colonoscopy: No longer required Recommended yearly ophthalmology/optometry visit for glaucoma screening and checkup Recommended yearly dental visit for hygiene and checkup  Vaccinations: Influenza vaccine: Due-May obtain vaccine at our office or your local pharmacy. Pneumococcal vaccine: Up to date Tdap vaccine: Up to date-Due-01/2028 Shingles vaccine: Completed vaccines   Covid-19: Booster due  Advanced directives: Copy in chart  Conditions/risks identified: See problem list  Next appointment: Follow up in one year for your annual wellness visit.   Preventive Care 21 Years and Older, Male Preventive care refers to lifestyle choices and visits with your health care provider that can promote health and wellness. What does preventive care include? A yearly physical exam. This is also called an annual well check. Dental exams once or twice a year. Routine eye exams. Ask your health care provider how often you should have your eyes checked. Personal lifestyle choices, including: Daily care of your teeth and gums. Regular physical activity. Eating a healthy diet. Avoiding tobacco and drug use. Limiting alcohol use. Practicing safe sex. Taking low doses of aspirin every day. Taking vitamin and mineral supplements as recommended by your health care provider. What happens during an annual well check? The services and screenings done by your health care provider during your annual well check will depend on your age, overall health, lifestyle risk factors, and family history of disease. Counseling  Your health care provider may ask you questions about your: Alcohol use. Tobacco use. Drug use. Emotional  well-being. Home and relationship well-being. Sexual activity. Eating habits. History of falls. Memory and ability to understand (cognition). Work and work Statistician. Screening  You may have the following tests or measurements: Height, weight, and BMI. Blood pressure. Lipid and cholesterol levels. These may be checked every 5 years, or more frequently if you are over 76 years old. Skin check. Lung cancer screening. You may have this screening every year starting at age 68 if you have a 30-pack-year history of smoking and currently smoke or have quit within the past 15 years. Fecal occult blood test (FOBT) of the stool. You may have this test every year starting at age 26. Flexible sigmoidoscopy or colonoscopy. You may have a sigmoidoscopy every 5 years or a colonoscopy every 10 years starting at age 47. Prostate cancer screening. Recommendations will vary depending on your family history and other risks. Hepatitis C blood test. Hepatitis B blood test. Sexually transmitted disease (STD) testing. Diabetes screening. This is done by checking your blood sugar (glucose) after you have not eaten for a while (fasting). You may have this done every 1-3 years. Abdominal aortic aneurysm (AAA) screening. You may need this if you are a current or former smoker. Osteoporosis. You may be screened starting at age 4 if you are at high risk. Talk with your health care provider about your test results, treatment options, and if necessary, the need for more tests. Vaccines  Your health care provider may recommend certain vaccines, such as: Influenza vaccine. This is recommended every year. Tetanus, diphtheria, and acellular pertussis (Tdap, Td) vaccine. You may need a Td booster every 10 years. Zoster vaccine. You may need this after age 72. Pneumococcal 13-valent conjugate (PCV13) vaccine. One dose is recommended after age 40. Pneumococcal polysaccharide (PPSV23)  vaccine. One dose is recommended after  age 4. Talk to your health care provider about which screenings and vaccines you need and how often you need them. This information is not intended to replace advice given to you by your health care provider. Make sure you discuss any questions you have with your health care provider. Document Released: 05/17/2015 Document Revised: 01/08/2016 Document Reviewed: 02/19/2015 Elsevier Interactive Patient Education  2017 [employer] Prevention in the Home Falls can cause injuries. They can happen to people of all ages. There are many things you can do to make your home safe and to help prevent falls. What can I do on the outside of my home? Regularly fix the edges of walkways and driveways and fix any cracks. Remove anything that might make you trip as you walk through a door, such as a raised step or threshold. Trim any bushes or trees on the path to your home. Use bright outdoor lighting. Clear any walking paths of anything that might make someone trip, such as rocks or tools. Regularly check to see if handrails are loose or broken. Make sure that both sides of any steps have handrails. Any raised decks and porches should have guardrails on the edges. Have any leaves, snow, or ice cleared regularly. Use sand or salt on walking paths during winter. Clean up any spills in your garage right away. This includes oil or grease spills. What can I do in the bathroom? Use night lights. Install grab bars by the toilet and in the tub and shower. Do not use towel bars as grab bars. Use non-skid mats or decals in the tub or shower. If you need to sit down in the shower, use a plastic, non-slip stool. Keep the floor dry. Clean up any water that spills on the floor as soon as it happens. Remove soap buildup in the tub or shower regularly. Attach bath mats securely with double-sided non-slip rug tape. Do not have throw rugs and other things on the floor that can make you trip. What can I do in the  bedroom? Use night lights. Make sure that you have a light by your bed that is easy to reach. Do not use any sheets or blankets that are too big for your bed. They should not hang down onto the floor. Have a firm chair that has side arms. You can use this for support while you get dressed. Do not have throw rugs and other things on the floor that can make you trip. What can I do in the kitchen? Clean up any spills right away. Avoid walking on wet floors. Keep items that you use a lot in easy-to-reach places. If you need to reach something above you, use a strong step stool that has a grab bar. Keep electrical cords out of the way. Do not use floor polish or wax that makes floors slippery. If you must use wax, use non-skid floor wax. Do not have throw rugs and other things on the floor that can make you trip. What can I do with my stairs? Do not leave any items on the stairs. Make sure that there are handrails on both sides of the stairs and use them. Fix handrails that are broken or loose. Make sure that handrails are as long as the stairways. Check any carpeting to make sure that it is firmly attached to the stairs. Fix any carpet that is loose or worn. Avoid having throw rugs at the top or bottom  of the stairs. If you do have throw rugs, attach them to the floor with carpet tape. Make sure that you have a light switch at the top of the stairs and the bottom of the stairs. If you do not have them, ask someone to add them for you. What else can I do to help prevent falls? Wear shoes that: Do not have high heels. Have rubber bottoms. Are comfortable and fit you well. Are closed at the toe. Do not wear sandals. If you use a stepladder: Make sure that it is fully opened. Do not climb a closed stepladder. Make sure that both sides of the stepladder are locked into place. Ask someone to hold it for you, if possible. Clearly mark and make sure that you can see: Any grab bars or  handrails. First and last steps. Where the edge of each step is. Use tools that help you move around (mobility aids) if they are needed. These include: Canes. Walkers. Scooters. Crutches. Turn on the lights when you go into a dark area. Replace any light bulbs as soon as they burn out. Set up your furniture so you have a clear path. Avoid moving your furniture around. If any of your floors are uneven, fix them. If there are any pets around you, be aware of where they are. Review your medicines with your doctor. Some medicines can make you feel dizzy. This can increase your chance of falling. Ask your doctor what other things that you can do to help prevent falls. This information is not intended to replace advice given to you by your health care provider. Make sure you discuss any questions you have with your health care provider. Document Released: 02/14/2009 Document Revised: 09/26/2015 Document Reviewed: 05/25/2014 Elsevier Interactive Patient Education  2017 [employer].

## 2021-01-08 NOTE — Progress Notes (Addendum)
Subjective:   Micheal Mcintyre is a 80 y.o. male who presents for Medicare Annual/Subsequent preventive examination.  I connected with Micheal Mcintyre today by telephone and verified that I am speaking with the correct person using two identifiers. Location patient: home Location provider: work Persons participating in the virtual visit: patient, Marine scientist.    I discussed the limitations, risks, security and privacy concerns of performing an evaluation and management service by telephone and the availability of in person appointments. I also discussed with the patient that there may be a patient responsible charge related to this service. The patient expressed understanding and verbally consented to this telephonic visit.    Interactive audio and video telecommunications were attempted between this provider and patient, however failed, due to patient having technical difficulties OR patient did not have access to video capability.  We continued and completed visit with audio only.  Some vital signs may be absent or patient reported.   Time Spent with patient on telephone encounter: 20 minutes   Review of Systems     Cardiac Risk Factors include: advanced age (>34mn, >>60women);dyslipidemia;hypertension;male gender     Objective:    Today's Vitals   01/08/21 0820  Weight: 162 lb (73.5 kg)  Height: '5\' 9"'$  (1.753 m)   Body mass index is 23.92 kg/m.  Advanced Directives 01/08/2021 02/06/2020 12/26/2019 12/23/2018 12/21/2017 06/01/2016 08/13/2014  Does Patient Have a Medical Advance Directive? Yes Yes Yes No No No No  Type of AParamed[place]of [place] will [place] will - - - -  Does patient want to make changes to medical advance directive? - - No - Patient declined - - Yes (MAU/Ambulatory/Procedural Areas - Information given) -  Copy of [place] Chart? Yes - validated most recent copy  scanned in chart (See row information) - No - copy requested - - - -  Would patient like information on creating a medical advance directive? - - - No - Patient declined Yes (MAU/Ambulatory/Procedural Areas - Information given) - Yes - Educational materials given    Current Medications (verified) Outpatient Encounter Medications as of 01/08/2021  Medication Sig   Krill Oil 300 MG CAPS Take 1 capsule by mouth daily.   sildenafil (VIAGRA) 50 MG tablet Take 1 tablet (50 mg total) by mouth daily as needed.   telmisartan (MICARDIS) 80 MG tablet TAKE 1/2 TABLET BY MOUTH TWICE DAILY   rosuvastatin (CRESTOR) 5 MG tablet Take 1 tablet (5 mg total) by mouth daily. (Patient not taking: Reported on 01/08/2021)   No facility-administered encounter medications on file as of 01/08/2021.    Allergies (verified) Patient has no known allergies.   History: Past Medical History:  Diagnosis Date   Arthritis    GERD (gastroesophageal reflux disease)    Hypertension    Medicare annual wellness visit, subsequent 08/19/2014   Osteoarthritis 09/21/2007   Qualifier: Diagnosis of  By: SNiel HummerMD, WLorinda Creed   Preventative health care 06/07/2016   Right shoulder pain 12/03/2016   Past Surgical History:  Procedure Laterality Date   CHOLECYSTECTOMY     HIP SURGERY     JOINT REPLACEMENT     TOTAL HIP ARTHROPLASTY     Family History  Problem Relation Age of Onset   Heart attack Father 626      deceased   Diabetes Maternal Uncle        maternal grandfather   Hypertension Neg  Hx    Breast cancer Neg Hx    Colon cancer Neg Hx    Prostate cancer Neg Hx    Social History   Socioeconomic History   Marital status: Married    Spouse name: Not on file   Number of children: Not on file   Years of education: Not on file   Highest education level: Not on file  Occupational History   Not on file  Tobacco Use   Smoking status: Never   Smokeless tobacco: Never  Vaping Use   Vaping Use: Never used  Substance  and Sexual Activity   Alcohol use: Yes    Comment: occ   Drug use: No   Sexual activity: Not Currently  Other Topics Concern   Not on file  Social History Narrative   Not on file   Social Determinants of Health   Financial Resource Strain: Low Risk    Difficulty of Paying Living Expenses: Not hard at all  Food Insecurity: No Food Insecurity   Worried About Charity fundraiser in the Last Year: Never true   [place] in the Last Year: Never true  Transportation Needs: No Transportation Needs   Lack of Transportation (Medical): No   Lack of Transportation (Non-Medical): No  Physical Activity: Sufficiently Active   Days of Exercise per Week: 5 days   Minutes of Exercise per Session: 30 min  Stress: No Stress Concern Present   Feeling of Stress : Not at all  Social Connections: Moderately Integrated   Frequency of Communication with Friends and Family: More than three times a week   Frequency of Social Gatherings with Friends and Family: More than three times a week   Attends Religious Services: More than 4 times per year   Active Member of [employer] or Organizations: No   Attends Music therapist: Never   Marital Status: Married    Tobacco Counseling Counseling given: Not Answered   Clinical Intake:  Pre-visit preparation completed: Yes  Pain : No/denies pain     BMI - recorded: 23.92 Nutritional Status: BMI of 19-24  Normal Nutritional Risks: None Diabetes: No  How often do you need to have someone help you when you read instructions, pamphlets, or other written materials from your doctor or pharmacy?: 1 - Never  Diabetic?No     Information entered by :: Caroleen Hamman LPN   Activities of Daily Living In your present state of health, do you have any difficulty performing the following activities: 01/08/2021  Hearing? N  Vision? N  Difficulty concentrating or making decisions? N  Walking or climbing stairs? N  Dressing or bathing? N  Doing  errands, shopping? N  Preparing Food and eating ? N  Using the Toilet? N  In the past six months, have you accidently leaked urine? Y  Comment occasionally  Do you have problems with loss of bowel control? N  Managing your Medications? N  Managing your Finances? N  Housekeeping or managing your Housekeeping? N  Some recent data might be hidden    Patient Care Team: [name], MD as PCP - General (Family Medicine)  Indicate any recent Medical Services you may have received from other than [facility] providers in the past year (date may be approximate).     Assessment:   This is a routine wellness examination for [facility].  Hearing/Vision screen Hearing Screening - Comments:: No issues Vision Screening - Comments:: Last eye exam-6 months ago  Dietary issues  and exercise activities discussed: Current Exercise Habits: Home exercise routine, Type of exercise: Other - see comments (swimming), Time (Minutes): 30, Frequency (Times/Week): 5, Weekly Exercise (Minutes/Week): 150, Intensity: Mild, Exercise limited by: None identified   Goals Addressed             This Visit's Progress    Exercise 150 min/wk Moderate Activity   On track    Continue swimming at the [place].       Depression Screen PHQ 2/9 Scores 01/08/2021 08/06/2020 12/26/2019 12/23/2018 12/21/2017 12/03/2016 06/01/2016  PHQ - 2 Score 0 0 0 0 0 0 0    Fall Risk Fall Risk  01/08/2021 08/06/2020 12/26/2019 12/23/2018 12/23/2018  Falls in the past year? 0 0 1 1 0  Number falls in past yr: 0 - 0 0 -  Comment - - - - -  Injury with Fall? 0 - 0 0 -  Risk for fall due to : - - - - -  Follow up Falls prevention discussed - Education provided;Falls prevention discussed Education provided;Falls prevention discussed -    FALL RISK PREVENTION PERTAINING TO THE HOME:  Any stairs in or around the home? Yes  If so, are there any without handrails? No  Home free of loose throw rugs in walkways, pet beds, electrical cords, etc? Yes   Adequate lighting in your home to reduce risk of falls? Yes   ASSISTIVE DEVICES UTILIZED TO PREVENT FALLS:  Life alert? No  Use of a cane, walker or w/c? No  Grab bars in the bathroom? Yes  Shower chair or bench in shower? No  Elevated toilet seat or a handicapped toilet? No   TIMED UP AND GO:  Was the test performed? No . Phone visit   Cognitive Function:Normal cognitive status assessed by this Nurse Health Advisor. No abnormalities found.   MMSE - Mini Mental State Exam 06/01/2016  Orientation to time 5  Orientation to Place 5  Registration 3  Attention/ Calculation 5  Recall 3  Language- name 2 objects 2  Language- repeat 1  Language- follow 3 step command 3  Language- read & follow direction 1  Write a sentence 1  Copy design 1  Total score 30        Immunizations Immunization History  Administered Date(s) Administered   Fluad Quad(high Dose 65+) 02/16/2019, 02/06/2020   Influenza Whole 02/07/2008, 02/01/2009   Influenza, High Dose Seasonal PF 01/07/2018   Influenza,inj,Quad PF,6+ Mos 01/24/2013, 01/01/2014   Influenza-Unspecified 02/23/2015, 03/04/2016   PFIZER(Purple Top)SARS-COV-2 Vaccination 05/27/2019, 06/19/2019, 03/04/2020   Pneumococcal Conjugate-13 05/24/2013   Pneumococcal Polysaccharide-23 02/07/2008, 06/01/2016   Td 09/28/2007, 01/29/2018   Zoster Recombinat (Shingrix) 03/20/2018, 06/21/2018   Zoster, Live 10/19/2007, 05/02/2009    TDAP status: Up to date  Flu Vaccine status: Due, Education has been provided regarding the importance of this vaccine. Advised may receive this vaccine at local pharmacy or Health Dept. Aware to provide a copy of the vaccination record if obtained from local pharmacy or Health Dept. Verbalized acceptance and understanding.  Pneumococcal vaccine status: Up to date  Covid-19 vaccine status: Information provided on how to obtain vaccines. Booster due  Qualifies for Shingles Vaccine? No   Zostavax completed Yes    Shingrix Completed?: Yes  Screening Tests Health Maintenance  Topic Date Due   COVID-19 Vaccine (4 - Booster for Pfizer series) 07/02/2020   INFLUENZA VACCINE  12/02/2020   Hepatitis C Screening  08/07/2031 (Originally 03/25/1959)   TETANUS/TDAP  01/30/2028  PNA vac Low Risk Adult  Completed   Zoster Vaccines- Shingrix  Completed   HPV VACCINES  Aged Out    Health Maintenance  Health Maintenance Due  Topic Date Due   COVID-19 Vaccine (4 - Booster for Pfizer series) 07/02/2020   INFLUENZA VACCINE  12/02/2020    Colorectal cancer screening: No longer required.   Lung Cancer Screening: (Low Dose CT Chest recommended if Age 42-80 years, 30 pack-year currently smoking OR have quit w/in 15years.) does not qualify.     Additional Screening:  Hepatitis C Screening: does not qualify  Vision Screening: Recommended annual ophthalmology exams for early detection of glaucoma and other disorders of the eye. Is the patient up to date with their annual eye exam?  yes Who is the provider or what is the name of the office in which the patient attends annual eye exams? Pt unsure of name   Dental Screening: Recommended annual dental exams for proper oral hygiene  Community Resource Referral / Chronic Care Management: CRR required this visit?  No   CCM required this visit?  No      Plan:     I have personally reviewed and noted the following in the patient's chart:   Medical and social history Use of alcohol, tobacco or illicit drugs  Current medications and supplements including opioid prescriptions. Patient is not currently taking opioid prescriptions. Functional ability and status Nutritional status Physical activity Advanced directives List of other physicians Hospitalizations, surgeries, and ER visits in previous 12 months Vitals Screenings to include cognitive, depression, and falls Referrals and appointments  In addition, I have reviewed and discussed with patient  certain preventive protocols, quality metrics, and best practice recommendations. A written personalized care plan for preventive services as well as general preventive health recommendations were provided to patient.   Due to this being a telephonic visit, the after visit summary with patients personalized plan was offered to patient via mail or my-chart. Patient would like to access on my-chart.    Marta Antu, LPN   [id number]  Nurse Health Advisor  Nurse Notes: None  I have reviewed and agree with Health Coaches documentation.  Kathlene November, MD

## 2021-03-19 ENCOUNTER — Other Ambulatory Visit: Payer: Self-pay

## 2021-03-20 ENCOUNTER — Encounter: Payer: Self-pay | Admitting: Family Medicine

## 2021-03-20 ENCOUNTER — Ambulatory Visit (INDEPENDENT_AMBULATORY_CARE_PROVIDER_SITE_OTHER): Payer: Medicare Other | Admitting: Family Medicine

## 2021-03-20 VITALS — BP 128/66 | HR 99 | Temp 97.5°F | Resp 16 | Ht 69.0 in | Wt 165.6 lb

## 2021-03-20 DIAGNOSIS — Z125 Encounter for screening for malignant neoplasm of prostate: Secondary | ICD-10-CM | POA: Diagnosis not present

## 2021-03-20 DIAGNOSIS — R252 Cramp and spasm: Secondary | ICD-10-CM | POA: Diagnosis not present

## 2021-03-20 DIAGNOSIS — E782 Mixed hyperlipidemia: Secondary | ICD-10-CM

## 2021-03-20 DIAGNOSIS — I10 Essential (primary) hypertension: Secondary | ICD-10-CM | POA: Diagnosis not present

## 2021-03-20 DIAGNOSIS — Z23 Encounter for immunization: Secondary | ICD-10-CM | POA: Diagnosis not present

## 2021-03-20 DIAGNOSIS — Z0000 Encounter for general adult medical examination without abnormal findings: Secondary | ICD-10-CM | POA: Diagnosis not present

## 2021-03-20 DIAGNOSIS — E039 Hypothyroidism, unspecified: Secondary | ICD-10-CM

## 2021-03-20 DIAGNOSIS — E559 Vitamin D deficiency, unspecified: Secondary | ICD-10-CM

## 2021-03-20 DIAGNOSIS — R739 Hyperglycemia, unspecified: Secondary | ICD-10-CM

## 2021-03-20 DIAGNOSIS — L578 Other skin changes due to chronic exposure to nonionizing radiation: Secondary | ICD-10-CM

## 2021-03-20 LAB — CBC WITH DIFFERENTIAL/PLATELET
Basophils Absolute: 0.1 10*3/uL (ref 0.0–0.1)
Basophils Relative: 0.8 % (ref 0.0–3.0)
Eosinophils Absolute: 0.1 10*3/uL (ref 0.0–0.7)
Eosinophils Relative: 1.3 % (ref 0.0–5.0)
HCT: 41.6 % (ref 39.0–52.0)
Hemoglobin: 13.8 g/dL (ref 13.0–17.0)
Lymphocytes Relative: 10.1 % — ABNORMAL LOW (ref 12.0–46.0)
Lymphs Abs: 0.7 10*3/uL (ref 0.7–4.0)
MCHC: 33.2 g/dL (ref 30.0–36.0)
MCV: 93.3 fl (ref 78.0–100.0)
Monocytes Absolute: 0.6 10*3/uL (ref 0.1–1.0)
Monocytes Relative: 8 % (ref 3.0–12.0)
Neutro Abs: 5.9 10*3/uL (ref 1.4–7.7)
Neutrophils Relative %: 79.8 % — ABNORMAL HIGH (ref 43.0–77.0)
Platelets: 208 10*3/uL (ref 150.0–400.0)
RBC: 4.46 Mil/uL (ref 4.22–5.81)
RDW: 12.7 % (ref 11.5–15.5)
WBC: 7.3 10*3/uL (ref 4.0–10.5)

## 2021-03-20 LAB — COMPREHENSIVE METABOLIC PANEL
ALT: 16 U/L (ref 0–53)
AST: 15 U/L (ref 0–37)
Albumin: 4 g/dL (ref 3.5–5.2)
Alkaline Phosphatase: 94 U/L (ref 39–117)
BUN: 16 mg/dL (ref 6–23)
CO2: 31 mEq/L (ref 19–32)
Calcium: 9.5 mg/dL (ref 8.4–10.5)
Chloride: 103 mEq/L (ref 96–112)
Creatinine, Ser: 1.15 mg/dL (ref 0.40–1.50)
GFR: 61.18 mL/min (ref >60.00)
Glucose, Bld: 104 mg/dL — ABNORMAL HIGH (ref 70–99)
Potassium: 5.1 mEq/L (ref 3.5–5.1)
Sodium: 139 mEq/L (ref 135–145)
Total Bilirubin: 0.8 mg/dL (ref 0.2–1.2)
Total Protein: 6.7 g/dL (ref 6.0–8.3)

## 2021-03-20 LAB — TSH: TSH: 1.55 u[IU]/mL (ref 0.35–5.50)

## 2021-03-20 LAB — MAGNESIUM: Magnesium: 2.1 mg/dL (ref 1.5–2.5)

## 2021-03-20 LAB — LIPID PANEL
Cholesterol: 161 mg/dL (ref 0–200)
HDL: 48.2 mg/dL (ref >39.00)
LDL Cholesterol: 89 mg/dL (ref 0–99)
NonHDL: 112.69
Total CHOL/HDL Ratio: 3
Triglycerides: 118 mg/dL (ref 0.0–149.0)
VLDL: 23.6 mg/dL (ref 0.0–40.0)

## 2021-03-20 LAB — HEMOGLOBIN A1C: Hgb A1c MFr Bld: 6.4 % (ref 4.6–6.5)

## 2021-03-20 LAB — PSA: PSA: 0.84 ng/mL (ref 0.10–4.00)

## 2021-03-20 NOTE — Assessment & Plan Note (Signed)
hgba1c acceptable, minimize simple carbs. Increase exercise as tolerated.  

## 2021-03-20 NOTE — Patient Instructions (Signed)
Preventive Care 65 Years and Older, Male °Preventive care refers to lifestyle choices and visits with your health care provider that can promote health and wellness. Preventive care visits are also called wellness exams. °What can I expect for my preventive care visit? °Counseling °During your preventive care visit, your health care provider may ask about your: °Medical history, including: °Past medical problems. °Family medical history. °History of falls. °Current health, including: °Emotional well-being. °Home life and relationship well-being. °Sexual activity. °Memory and ability to understand (cognition). °Lifestyle, including: °Alcohol, nicotine or tobacco, and drug use. °Access to firearms. °Diet, exercise, and sleep habits. °Work and work environment. °Sunscreen use. °Safety issues such as seatbelt and bike helmet use. °Physical exam °Your health care provider will check your: °Height and weight. These may be used to calculate your BMI (body mass index). BMI is a measurement that tells if you are at a healthy weight. °Waist circumference. This measures the distance around your waistline. This measurement also tells if you are at a healthy weight and may help predict your risk of certain diseases, such as type 2 diabetes and high blood pressure. °Heart rate and blood pressure. °Body temperature. °Skin for abnormal spots. °What immunizations do I need? °Vaccines are usually given at various ages, according to a schedule. Your health care provider will recommend vaccines for you based on your age, medical history, and lifestyle or other factors, such as travel or where you work. °What tests do I need? °Screening °Your health care provider may recommend screening tests for certain conditions. This may include: °Lipid and cholesterol levels. °Diabetes screening. This is done by checking your blood sugar (glucose) after you have not eaten for a while (fasting). °Hepatitis C test. °Hepatitis B test. °HIV (human  immunodeficiency virus) test. °STI (sexually transmitted infection) testing, if you are at risk. °Lung cancer screening. °Colorectal cancer screening. °Prostate cancer screening. °Abdominal aortic aneurysm (AAA) screening. You may need this if you are a current or former smoker. °Talk with your health care provider about your test results, treatment options, and if necessary, the need for more tests. °Follow these instructions at home: °Eating and drinking ° °Eat a diet that includes fresh fruits and vegetables, whole grains, lean protein, and low-fat dairy products. Limit your intake of foods with high amounts of sugar, saturated fats, and salt. °Take vitamin and mineral supplements as recommended by your health care provider. °Do not drink alcohol if your health care provider tells you not to drink. °If you drink alcohol: °Limit how much you have to 0-2 drinks a day. °Know how much alcohol is in your drink. In the [place] one drink equals one 12 oz bottle of beer (355 mL), one 5 oz glass of wine (148 mL), or one 1½ oz glass of hard liquor (44 mL). °Lifestyle °Brush your teeth every morning and night with fluoride toothpaste. Floss one time each day. °Exercise for at least 30 minutes 5 or more days each week. °Do not use any products that contain nicotine or tobacco. These products include cigarettes, chewing tobacco, and vaping devices, such as e-cigarettes. If you need help quitting, ask your health care provider. °Do not use drugs. °If you are sexually active, practice safe sex. Use a condom or other form of protection to prevent STIs. °Take aspirin only as told by your health care provider. Make sure that you understand how much to take and what form to take. Work with your health care provider to find out whether it is safe and   beneficial for you to take aspirin daily. °Ask your health care provider if you need to take a cholesterol-lowering medicine (statin). °Find healthy ways to manage stress, such  as: °Meditation, yoga, or listening to music. °Journaling. °Talking to a trusted person. °Spending time with friends and family. °Safety °Always wear your seat belt while driving or riding in a vehicle. °Do not drive: °If you have been drinking alcohol. Do not ride with someone who has been drinking. °When you are tired or distracted. °While texting. °If you have been using any mind-altering substances or drugs. °Wear a helmet and other protective equipment during sports activities. °If you have firearms in your house, make sure you follow all gun safety procedures. °Minimize exposure to UV radiation to reduce your risk of skin cancer. °What's next? °Visit your health care provider once a year for an annual wellness visit. °Ask your health care provider how often you should have your eyes and teeth checked. °Stay up to date on all vaccines. °This information is not intended to replace advice given to you by your health care provider. Make sure you discuss any questions you have with your health care provider. °Document Revised: 10/16/2020 Document Reviewed: 10/16/2020 °Elsevier Patient Education © 2022 [employer] ° °

## 2021-03-20 NOTE — Progress Notes (Signed)
Patient ID: Micheal Mcintyre, male    DOB: 01-23-1941  Age: 80 y.o. MRN: [id number]    Subjective:   Chief Complaint  Patient presents with   Annual Exam   Subjective  HPI Micheal Mcintyre presents for office visit today for comprehensive physical exam today and follow up on management of chronic concerns. He is doing well but 3 weeks ago had the flu and struggled with symptoms. He has no recent ER visits to report. Denies CP/palp/SOB/HA/congestion/fevers/GI or GU c/o. Taking meds as prescribed. He goes 5x weekly for swimming. He experiences muscle cramps when doing lots of physical work without hydrating.  COVID illness: He has had covid back in July 2023 and reports that he only experienced mild symptoms for 1.5 days. He received the 3 first shot series of covid.  Med Management: He stopped taking rosuvastatin 5 mg due to side effects of lightheadedness and fatigue after yard work or physical exertion. However, his wife had multiple surgeries recently which might be contributing to his fatigue and stress. At the moment he states that he noticed some improvements in symptoms since stopping. He also dropped dosage of telmisartan due to lightheadedness. He reports that his BP at home fluctuates.  Gastro: Dr. Carlean Purl told him no need for colonoscopy screens since his results in 2010.  Had precancerous lesion removed by derm last Tuesday   Review of Systems  Constitutional:  Negative for chills, fatigue and fever.  HENT:  Negative for congestion, rhinorrhea, sinus pressure, sinus pain, sore throat and trouble swallowing.   Eyes:  Negative for pain.  Respiratory:  Negative for cough and shortness of breath.   Cardiovascular:  Negative for chest pain, palpitations and leg swelling.  Gastrointestinal:  Negative for abdominal pain, blood in stool, diarrhea, nausea and vomiting.  Genitourinary:  Negative for flank pain, frequency and penile pain.  Musculoskeletal:  Negative for back pain.  Neurological:   Negative for headaches.   History Past Medical History:  Diagnosis Date   Arthritis    GERD (gastroesophageal reflux disease)    Hypertension    Medicare annual wellness visit, subsequent 08/19/2014   Osteoarthritis 09/21/2007   Qualifier: Diagnosis of  By: Niel Hummer MD, Lorinda Creed    Preventative health care 06/07/2016   Right shoulder pain 12/03/2016    He has a past surgical history that includes Cholecystectomy; Hip surgery; Total hip arthroplasty; Joint replacement; and nose skin cancer removal.   His family history includes Diabetes in his maternal uncle; Heart attack (age of onset: 50) in his father.He reports that he has never smoked. He has never used smokeless tobacco. He reports current alcohol use. He reports that he does not use drugs.  Current Outpatient Medications on File Prior to Visit  Medication Sig Dispense Refill   Krill Oil 300 MG CAPS Take 1 capsule by mouth daily.     sildenafil (VIAGRA) 50 MG tablet Take 1 tablet (50 mg total) by mouth daily as needed. 10 tablet 3   telmisartan (MICARDIS) 80 MG tablet TAKE 1/2 TABLET BY MOUTH TWICE DAILY 180 tablet 1   rosuvastatin (CRESTOR) 5 MG tablet Take 1 tablet (5 mg total) by mouth daily. (Patient not taking: Reported on 01/08/2021) 90 tablet 1   No current facility-administered medications on file prior to visit.     Objective:  Objective  Physical Exam Constitutional:      General: He is not in acute distress.    Appearance: Normal appearance. He is not ill-appearing or  toxic-appearing.  HENT:     Head: Normocephalic and atraumatic.     Right Ear: Tympanic membrane, ear canal and external ear normal.     Left Ear: Tympanic membrane, ear canal and external ear normal.     Nose: No congestion or rhinorrhea.  Eyes:     Extraocular Movements: Extraocular movements intact.     Right eye: No nystagmus.     Left eye: No nystagmus.     Pupils: Pupils are equal, round, and reactive to light.  Cardiovascular:     Rate and  Rhythm: Normal rate and regular rhythm.     Pulses: Normal pulses.          Posterior tibial pulses are 2+ on the right side and 2+ on the left side.     Heart sounds: Normal heart sounds. No murmur heard. Pulmonary:     Effort: Pulmonary effort is normal. No respiratory distress.     Breath sounds: Normal breath sounds. No wheezing, rhonchi or rales.  Abdominal:     General: Bowel sounds are normal.     Palpations: Abdomen is soft. There is no mass.     Tenderness: There is no abdominal tenderness. There is no guarding.     Hernia: No hernia is present.  Musculoskeletal:        General: Normal range of motion.     Cervical back: Normal range of motion and neck supple.  Skin:    General: Skin is warm and dry.  Neurological:     Mental Status: He is alert and oriented to person, place, and time.     Cranial Nerves: No facial asymmetry.     Motor: Motor function is intact. No weakness.     Deep Tendon Reflexes:     Reflex Scores:      Patellar reflexes are 1+ on the right side and 1+ on the left side. Psychiatric:        Behavior: Behavior normal.   BP 128/66   Pulse 99   Temp (!) 97.5 F (36.4 C)   Resp 16   Ht 5\' 9"  (1.753 m)   Wt 165 lb 9.6 oz (75.1 kg)   SpO2 99%   BMI 24.45 kg/m  Wt Readings from Last 3 Encounters:  03/20/21 165 lb 9.6 oz (75.1 kg)  01/08/21 162 lb (73.5 kg)  08/06/20 167 lb 3.2 oz (75.8 kg)     Lab Results  Component Value Date   WBC 6.7 02/06/2020   HGB 14.9 02/06/2020   HCT 43.8 02/06/2020   PLT 163 02/06/2020   GLUCOSE 104 (H) 08/06/2020   CHOL 130 08/06/2020   TRIG 64.0 08/06/2020   HDL 58.10 08/06/2020   LDLCALC 59 08/06/2020   ALT 14 08/06/2020   AST 16 08/06/2020   NA 141 08/06/2020   K 4.4 08/06/2020   CL 107 08/06/2020   CREATININE 1.06 08/06/2020   BUN 20 08/06/2020   CO2 27 08/06/2020   TSH 1.31 02/06/2020   PSA 0.70 02/06/2020   HGBA1C 6.3 08/06/2020    ECHOCARDIOGRAM COMPLETE  Result Date: 03/06/2020     ECHOCARDIOGRAM REPORT   Patient Name:   Micheal Mcintyre Date of Exam: 03/06/2020 Medical Rec #:  [id number]   Height:       69.0 in Accession #:    [id number]  Weight:       165.4 lb Date of Birth:  [birth year]  BSA:          1.906  m Patient Age:    42 years    BP:           139/72 mmHg Patient Gender: M           HR:           75 bpm. Exam Location:  [facility] Procedure: 2D Echo, Cardiac Doppler and Color Doppler Indications:    R06.9 DOE  History:        Patient has no prior history of Echocardiogram examinations.                 Signs/Symptoms:Dyspnea; Risk Factors:Hypertension, Dyslipidemia                 and Non-Smoker.  Sonographer:    Geradine Girt Referring Phys: [place]  1. Left ventricular ejection fraction, by estimation, is 60 to 65%. The left ventricle has normal function. The left ventricle has no regional wall motion abnormalities.  2. Right ventricular systolic function is normal. The right ventricular size is normal. There is normal pulmonary artery systolic pressure.  3. The mitral valve is normal in structure. No evidence of mitral valve regurgitation. No evidence of mitral stenosis.  4. The aortic valve is normal in structure. Aortic valve regurgitation is not visualized. No aortic stenosis is present.  5. The inferior vena cava is normal in size with greater than 50% respiratory variability, suggesting right atrial pressure of 3 mmHg. FINDINGS  Left Ventricle: Left ventricular ejection fraction, by estimation, is 60 to 65%. The left ventricle has normal function. The left ventricle has no regional wall motion abnormalities. The left ventricular internal cavity size was normal in size. There is  no left ventricular hypertrophy. Left ventricular diastolic parameters were normal. Right Ventricle: The right ventricular size is normal. No increase in right ventricular wall thickness. Right ventricular systolic function is normal. There is normal pulmonary artery systolic pressure.  The tricuspid regurgitant velocity is 2.28 m/s, and  with an assumed right atrial pressure of 3 mmHg, the estimated right ventricular systolic pressure is 54.6 mmHg. Left Atrium: Left atrial size was normal in size. Right Atrium: Right atrial size was normal in size. Pericardium: There is no evidence of pericardial effusion. Mitral Valve: The mitral valve is normal in structure. No evidence of mitral valve regurgitation. No evidence of mitral valve stenosis. Tricuspid Valve: The tricuspid valve is normal in structure. Tricuspid valve regurgitation is mild . No evidence of tricuspid stenosis. Aortic Valve: The aortic valve is normal in structure. Aortic valve regurgitation is not visualized. No aortic stenosis is present. Pulmonic Valve: The pulmonic valve was normal in structure. Pulmonic valve regurgitation is not visualized. No evidence of pulmonic stenosis. Aorta: The aortic root is normal in size and structure. Venous: The inferior vena cava is normal in size with greater than 50% respiratory variability, suggesting right atrial pressure of 3 mmHg. IAS/Shunts: No atrial level shunt detected by color flow Doppler.  LEFT VENTRICLE PLAX 2D LVIDd:         3.95 cm  Diastology LVIDs:         2.12 cm  LV e' medial:    5.11 cm/s LV PW:         0.71 cm  LV E/e' medial:  13.3 LV IVS:        1.51 cm  LV e' lateral:   8.27 cm/s LVOT diam:     1.90 cm  LV E/e' lateral: 8.2 LV SV:  55 LV SV Index:   29 LVOT Area:     2.84 cm  RIGHT VENTRICLE RV S prime:     12.80 cm/s TAPSE (M-mode): 2.0 cm LEFT ATRIUM             Index       RIGHT ATRIUM           Index LA diam:        3.30 cm 1.73 cm/m  RA Area:     10.60 cm LA Vol (A2C):   24.5 ml 12.86 ml/m RA Volume:   21.40 ml  11.23 ml/m LA Vol (A4C):   40.2 ml 21.09 ml/m LA Biplane Vol: 31.8 ml 16.69 ml/m  AORTIC VALVE LVOT Vmax:   93.30 cm/s LVOT Vmean:  61.600 cm/s LVOT VTI:    0.194 m  AORTA Ao Root diam: 2.80 cm Ao Asc diam:  3.20 cm MITRAL VALVE                TRICUSPID VALVE MV Area (PHT): 3.93 cm    TR Peak grad:   20.8 mmHg MV Decel Time: 193 msec    TR Vmax:        228.00 cm/s MV E velocity: 68.10 cm/s MV A velocity: 67.30 cm/s  SHUNTS MV E/A ratio:  1.01        Systemic VTI:  0.19 m                            Systemic Diam: 1.90 cm Jyl Heinz MD Electronically signed by Jyl Heinz MD Signature Date/Time: 03/06/2020/10:12:47 AM    Final      Assessment & Plan:  Plan    No orders of the defined types were placed in this encounter.   Problem List Items Addressed This Visit     Hypothyroidism   Relevant Orders   TSH   Hyperlipidemia, mixed    Encourage heart healthy diet such as MIND or DASH diet, increase exercise, avoid trans fats, simple carbohydrates and processed foods, consider a krill or fish or flaxseed oil cap daily. Stopped Rosuvastatin to see if his       Relevant Orders   CBC with Differential/Platelet   Comprehensive metabolic panel   Lipid panel   Essential hypertension - Primary    Well controlled, no changes to meds. Encouraged heart healthy diet such as the DASH diet and exercise as tolerated.       Relevant Orders   CBC with Differential/Platelet   Comprehensive metabolic panel   Lipid panel   Hyperglycemia    hgba1c acceptable, minimize simple carbs. Increase exercise as tolerated.       Relevant Orders   Hemoglobin A1c   Vitamin D deficiency    Supplement and monitor      Relevant Orders   Vitamin D 1,25 dihydroxy   Preventative health care    Patient encouraged to maintain heart healthy diet, regular exercise, adequate sleep. Consider daily probiotics. Take medications as prescribed. Labs ordered and reviewed. Per gastroenterology he has aged out of colonoscopy      Sun-damaged skin    Dermatologist removed precancerous lesion from his nose this week      Muscle cramps    Better but still happens occasionally better with mustard and water, usually occurs with hard yard work, magnesium  checked today      Relevant Orders   Magnesium   Other Visit Diagnoses  Screening for prostate cancer       Relevant Orders   PSA   Need for influenza vaccination       Relevant Orders   Flu Vaccine QUAD High Dose(Fluad) (Completed)       Follow-up: Return in about 6 months (around 09/17/2021) for f/u visit.  I, Suezanne Jacquet, acting as a scribe for Penni Homans, MD, have documented all relevent documentation on behalf of Penni Homans, MD, as directed by Penni Homans, MD while in the presence of Penni Homans, MD. DO:03/20/21.  I, [name], MD personally performed the services described in this documentation. All medical record entries made by the scribe were at my direction and in my presence. I have reviewed the chart and agree that the record reflects my personal performance and is accurate and complete

## 2021-03-20 NOTE — Assessment & Plan Note (Addendum)
Encourage heart healthy diet such as MIND or DASH diet, increase exercise, avoid trans fats, simple carbohydrates and processed foods, consider a krill or fish or flaxseed oil cap daily. Stopped Rosuvastatin to see if his

## 2021-03-20 NOTE — Assessment & Plan Note (Signed)
Supplement and monitor 

## 2021-03-20 NOTE — Assessment & Plan Note (Signed)
Better but still happens occasionally better with mustard and water, usually occurs with hard yard work, magnesium checked today

## 2021-03-20 NOTE — Assessment & Plan Note (Addendum)
Patient encouraged to maintain heart healthy diet, regular exercise, adequate sleep. Consider daily probiotics. Take medications as prescribed. Labs ordered and reviewed. Per gastroenterology he has aged out of colonoscopy

## 2021-03-20 NOTE — Assessment & Plan Note (Signed)
Well controlled, no changes to meds. Encouraged heart healthy diet such as the DASH diet and exercise as tolerated.  °

## 2021-03-20 NOTE — Assessment & Plan Note (Signed)
Dermatologist removed precancerous lesion from his nose this week

## 2021-03-24 LAB — VITAMIN D 1,25 DIHYDROXY
Vitamin D 1, 25 (OH)2 Total: 27 pg/mL (ref 18–72)
Vitamin D2 1, 25 (OH)2: <8 pg/mL
Vitamin D3 1, 25 (OH)2: 27 pg/mL

## 2021-03-26 ENCOUNTER — Encounter: Payer: Self-pay | Admitting: *Deleted

## 2021-07-07 ENCOUNTER — Ambulatory Visit (INDEPENDENT_AMBULATORY_CARE_PROVIDER_SITE_OTHER): Payer: Medicare Other | Admitting: Family Medicine

## 2021-07-07 ENCOUNTER — Encounter: Payer: Self-pay | Admitting: Family Medicine

## 2021-07-07 ENCOUNTER — Other Ambulatory Visit: Payer: Self-pay

## 2021-07-07 ENCOUNTER — Ambulatory Visit (HOSPITAL_BASED_OUTPATIENT_CLINIC_OR_DEPARTMENT_OTHER)
Admission: RE | Admit: 2021-07-07 | Discharge: 2021-07-07 | Disposition: A | Discharge status: Home / Self Care | Payer: Medicare Other | Source: Ambulatory Visit | Attending: Family Medicine | Admitting: Family Medicine

## 2021-07-07 VITALS — BP 149/74 | HR 79 | Ht 69.0 in | Wt 167.2 lb

## 2021-07-07 DIAGNOSIS — M5416 Radiculopathy, lumbar region: Secondary | ICD-10-CM | POA: Diagnosis not present

## 2021-07-07 MED ORDER — PREDNISONE 20 MG PO TABS: 40.0000 mg | ORAL_TABLET | Freq: Every day | ORAL | 0 refills | Status: AC

## 2021-07-07 NOTE — Progress Notes (Signed)
? ?Acute Office Visit ? ?Subjective:  ? ? Patient ID: Micheal Mcintyre, male    DOB: 22-Feb-1941, 81 y.o.   MRN: [id number] ? ?CC: low back pain  ? ?HPI ?Patient is in today for back pain.  ? ? ?BACK PAIN ?Duration: 3+ weeks,  ?Mechanism of injury: no trauma ?Location: midline and low back ?Onset: gradual ?Severity: 4/10 ?Quality: dull, aching, and shooting ?Frequency: constant ?Radiation: R leg below the knee and L leg below the knee ?Aggravating factors: lifting, movement, walking, and prolonged sitting ?Alleviating factors:  swimming, rest, and NSAIDs ?Status: fluctuating ?Treatments attempted: rest, ice, heat, and ibuprofen  ?Relief with NSAIDs?: moderate ?Nighttime pain:  no ?Paresthesias / decreased sensation: intermittent   numbness/tingling/pain into both feet; no saddle paresthesia  ?Bowel / bladder incontinence:  no ?Fevers:  no ?Dysuria / urinary frequency:  no ? ? ?Swims every day for exercise and that usually helps, trying to avoid a lot of walking and lifting/bending as that makes back pain worse  ? ?Chronic hip pain (age 39, fell out of tree, multiple surgeries), septicemia d/t hip infection in 1990s, wears a shoe lift, no major problems since then  ? ? ? ? ?Past Medical History:  ?Diagnosis Date  ? Arthritis   ? GERD (gastroesophageal reflux disease)   ? Hypertension   ? Medicare annual wellness visit, subsequent 08/19/2014  ? 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 Qualifier: Diagnosis of  By: Niel Hummer MD, Lorinda Creed   ? Preventative health care 06/07/2016  ? Right shoulder pain 12/03/2016  ? ? ?Past Surgical History:  ?Procedure Laterality Date  ? CHOLECYSTECTOMY    ? HIP SURGERY    ? JOINT REPLACEMENT    ? nose skin cancer removal    ? TOTAL HIP ARTHROPLASTY    ? ? ?Family History  ?Problem Relation Age of Onset  ? Heart attack Father 76  ?     deceased  ? Diabetes Maternal Uncle   ?     maternal grandfather  ? Hypertension Neg Hx   ? Breast cancer Neg Hx   ? Colon cancer Neg Hx   ? Prostate cancer Neg Hx   ? ? ?Social  History  ? ?Socioeconomic History  ? Marital status: Married  ?  Spouse name: Not on file  ? Number of children: Not on file  ? Years of education: Not on file  ? Highest education level: Not on file  ?Occupational History  ? Not on file  ?Tobacco Use  ? Smoking status: Never  ? Smokeless tobacco: Never  ?Vaping Use  ? Vaping Use: Never used  ?Substance and Sexual Activity  ? Alcohol use: Yes  ?  Comment: occ  ? Drug use: No  ? Sexual activity: Not Currently  ?Other Topics Concern  ? Not on file  ?Social History Narrative  ? Not on file  ? ?Social Determinants of Health  ? ?Financial Resource Strain: Low Risk   ? Difficulty of Paying Living Expenses: Not hard at all  ?Food Insecurity: No Food Insecurity  ? Worried About Charity fundraiser in the Last Year: Never true  ? Ran Out of Food in the Last Year: Never true  ?Transportation Needs: No Transportation Needs  ? Lack of Transportation (Medical): No  ? Lack of Transportation (Non-Medical): No  ?Physical Activity: Sufficiently Active  ? Days of Exercise per Week: 5 days  ? Minutes of Exercise per Session: 30 min  ?Stress: No Stress Concern Present  ? Feeling  of Stress : Not at all  ?Social Connections: Moderately Integrated  ? Frequency of Communication with Friends and Family: More than three times a week  ? Frequency of Social Gatherings with Friends and Family: More than three times a week  ? Attends Religious Services: More than 4 times per year  ? Active Member of Clubs or Organizations: No  ? Attends Archivist Meetings: Never  ? Marital Status: Married  ?Intimate Partner Violence: Not At Risk  ? Fear of Current or Ex-Partner: No  ? Emotionally Abused: No  ? Physically Abused: No  ? Sexually Abused: No  ? ? ?Outpatient Medications Prior to Visit  ?Medication Sig Dispense Refill  ? Krill Oil 300 MG CAPS Take 1 capsule by mouth daily.    ? rosuvastatin (CRESTOR) 5 MG tablet Take 1 tablet (5 mg total) by mouth daily. (Patient not taking: Reported on  01/08/2021) 90 tablet 1  ? sildenafil (VIAGRA) 50 MG tablet Take 1 tablet (50 mg total) by mouth daily as needed. 10 tablet 3  ? telmisartan (MICARDIS) 80 MG tablet TAKE 1/2 TABLET BY MOUTH TWICE DAILY 180 tablet 1  ? ?No facility-administered medications prior to visit.  ? ? ?No Known Allergies ? ?Review of Systems ?All review of systems negative except what is listed in the HPI ? ?   ?Objective:  ?  ?Physical Exam ?Vitals reviewed.  ?Constitutional:   ?   Appearance: Normal appearance.  ?Musculoskeletal:     ?   General: Normal range of motion.  ?   Cervical back: Normal range of motion and neck supple.  ?   Comments: Normal core ROM, pain worse with back extension  ?Skin: ?   General: Skin is warm and dry.  ?Neurological:  ?   General: No focal deficit present.  ?   Mental Status: He is alert and oriented to person, place, and time. Mental status is at baseline.  ?Psychiatric:     ?   Mood and Affect: Mood normal.     ?   Behavior: Behavior normal.     ?   Thought Content: Thought content normal.     ?   Judgment: Judgment normal.  ? ? ? ? ?BP (!) 149/74   Pulse 79   Ht '5\' 9"'$  (1.753 m)   Wt 167 lb 3.2 oz (75.8 kg)   BMI 24.69 kg/m?  ?Wt Readings from Last 3 Encounters:  ?07/07/21 167 lb 3.2 oz (75.8 kg)  ?03/20/21 165 lb 9.6 oz (75.1 kg)  ?01/08/21 162 lb (73.5 kg)  ? ? ?Health Maintenance Due  ?Topic Date Due  ? COVID-19 Vaccine (4 - Booster for [place] series) 04/29/2020  ? ? ?There are no preventive care reminders to display for this patient. ? ? ?Lab Results  ?Component Value Date  ? TSH 1.55 03/20/2021  ? ?Lab Results  ?Component Value Date  ? WBC 7.3 03/20/2021  ? HGB 13.8 03/20/2021  ? HCT 41.6 03/20/2021  ? MCV 93.3 03/20/2021  ? PLT 208.0 03/20/2021  ? ?Lab Results  ?Component Value Date  ? NA 139 03/20/2021  ? K 5.1 03/20/2021  ? CO2 31 03/20/2021  ? GLUCOSE 104 (H) 03/20/2021  ? BUN 16 03/20/2021  ? CREATININE 1.15 03/20/2021  ? BILITOT 0.8 03/20/2021  ? ALKPHOS 94 03/20/2021  ? AST 15 03/20/2021  ?  ALT 16 03/20/2021  ? PROT 6.7 03/20/2021  ? ALBUMIN 4.0 03/20/2021  ? CALCIUM 9.5 03/20/2021  ? GFR 61.18  03/20/2021  ? ?Lab Results  ?Component Value Date  ? CHOL 161 03/20/2021  ? ?Lab Results  ?Component Value Date  ? HDL 48.20 03/20/2021  ? ?Lab Results  ?Component Value Date  ? Orr 89 03/20/2021  ? ?Lab Results  ?Component Value Date  ? TRIG 118.0 03/20/2021  ? ?Lab Results  ?Component Value Date  ? CHOLHDL 3 03/20/2021  ? ?Lab Results  ?Component Value Date  ? HGBA1C 6.4 03/20/2021  ? ? ?   ?Assessment & Plan:  ? ? ?1. Lumbar back pain with radiculopathy affecting lower extremity ?Xrays today.  ?Adding a few days of prednisone - do not take any other antiinflammatories (ibuprofen, Aleve, etc.) - resume your PRN ibuprofen AFTER the prednisone is done since that has seemed to be working for you  ?Home exercises provided ?Sports medicine referral as you likely will need advanced imaging.  ?If you develop any "saddle paresthesia" (numbness in your groin/privates), incontinence (losing control of bladder/bowels), severe pain, unusual weakness, etc. Go to the Emergency Department.  ? ? ? ?- predniSONE (DELTASONE) 20 MG tablet; Take 2 tablets (40 mg total) by mouth daily with breakfast for 5 days.  Dispense: 10 tablet; Refill: 0 ?- DG Lumbar Spine Complete; Future ?- Ambulatory referral to Sports Medicine ? ? ?Follow-up as previously scheduled or sooner if needed.  ? ? ? ? ?Terrilyn Saver, NP ? ?

## 2021-07-07 NOTE — Patient Instructions (Addendum)
Xrays today.  ?Adding a few days of prednisone - do not take any other antiinflammatories (ibuprofen, Aleve, etc.) ?Home exercises ?Sports medicine referral as you likely will need advanced imaging.  ?If you develop any "saddle paresthesia" (numbness in your groin/privates), incontinence (losing control of bladder/bowels), severe pain, unusual weakness, etc. Go to the Emergency Department.  ?

## 2021-07-15 ENCOUNTER — Encounter: Payer: Self-pay | Admitting: Family Medicine

## 2021-07-15 ENCOUNTER — Ambulatory Visit (INDEPENDENT_AMBULATORY_CARE_PROVIDER_SITE_OTHER): Payer: Medicare Other | Admitting: Family Medicine

## 2021-07-15 VITALS — BP 120/62 | Ht 69.0 in | Wt 167.0 lb

## 2021-07-15 DIAGNOSIS — M4127 Other idiopathic scoliosis, lumbosacral region: Secondary | ICD-10-CM | POA: Diagnosis not present

## 2021-07-15 DIAGNOSIS — G5702 Lesion of sciatic nerve, left lower limb: Secondary | ICD-10-CM

## 2021-07-15 DIAGNOSIS — M2170 Unequal limb length (acquired), unspecified site: Secondary | ICD-10-CM | POA: Diagnosis not present

## 2021-07-15 DIAGNOSIS — M47816 Spondylosis without myelopathy or radiculopathy, lumbar region: Secondary | ICD-10-CM | POA: Insufficient documentation

## 2021-07-15 DIAGNOSIS — M5416 Radiculopathy, lumbar region: Secondary | ICD-10-CM | POA: Insufficient documentation

## 2021-07-15 NOTE — Patient Instructions (Signed)
Nice to meet you ?Please try the exercises  ?Please adding tylenol for pain  ?Please try heat   ?Please send me a message in MyChart with any questions or updates.  ?Please see me back in 6 weeks or as needed.  ? ?--Dr. Raeford Razor ? ?

## 2021-07-15 NOTE — Assessment & Plan Note (Signed)
Acute on chronic in nature.  Has significant shortening of his left leg when compared to his right.  He has adapted to his biomechanics over time. ?-Counseled on home exercise therapy and supportive care. ?-Could consider orthotics and heel lifts or a prosthesis. ?

## 2021-07-15 NOTE — Assessment & Plan Note (Signed)
Acutely occurring.  He has significant bowel mechanic changes given his previous surgeries.  Does seem to have component of imbalance that is leading to his recent irritation. ?-Counseled on home exercise therapy and supportive care. ?-Could consider physical therapy or injection. ?

## 2021-07-15 NOTE — Progress Notes (Addendum)
?  Micheal Mcintyre - 81 y.o. male MRN [id number]  Date of birth: [birth year] ? ?SUBJECTIVE:  Including CC & ROS.  ?No chief complaint on file. ? ? ?Micheal Mcintyre is a 81 y.o. male that is presenting with acute left-sided gluteal pain.  The pain is intermittent in nature.  Seems to be worse with certain movements.  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  He developed sepsis of the surgical hardware and had a fusion of his left femur into his acetabulum.  He has subsequent right total hip arthroplasty.  Has bilateral leg symptoms at times.  He swims to help with his scoliosis and pain. ? ?Review of the note from 3/6 shows he was provided prednisone and x-rays obtained. 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 ? ? ?Review of Systems ?See HPI  ? ?HISTORY: Past Medical, Surgical, Social, and Family History Reviewed & Updated per EMR.   ?Pertinent Historical Findings include: ? ?Past Medical History:  ?Diagnosis Date  ? Arthritis   ? GERD (gastroesophageal reflux disease)   ? Hypertension   ? Medicare annual wellness visit, subsequent 08/19/2014  ? 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 Qualifier: Diagnosis of  By: Niel Hummer MD, Lorinda Creed   ? Preventative health care 06/07/2016  ? Right shoulder pain 12/03/2016  ? ? ?Past Surgical History:  ?Procedure Laterality Date  ? CHOLECYSTECTOMY    ? HIP SURGERY    ? JOINT REPLACEMENT    ? nose skin cancer removal    ? TOTAL HIP ARTHROPLASTY    ? ? ? ?PHYSICAL EXAM:  ?VS: BP 120/62 (BP Location: Left Arm, Patient Position: Sitting)   Ht '5\' 9"'$  (1.753 m)   Wt 167 lb (75.8 kg)   BMI 24.66 kg/m?  ?Physical Exam ?Gen: NAD, alert, cooperative with exam, well-appearing ?MSK:  ?Neurovascularly intact   ? ? ? ? ?ASSESSMENT & PLAN:  ? ?Piriformis syndrome of left side ?Acutely occurring.  He has significant bowel mechanic changes given his previous surgeries.  Does seem to have  component of imbalance that is leading to his recent irritation. ?-Counseled on home exercise therapy and supportive care. ?-Could consider physical therapy or injection. ? ?Facet arthritis of lumbar region ?Acutely occurring.  Does have degenerative changes of the facet joints in the lumbar region which could be contributing to some of this pain that he feels in his gluteus. ?-Counseled on home exercise therapy and supportive care. ?-Could consider further imaging for facet injections. ? ?Other idiopathic scoliosis, lumbosacral region ?Acute on chronic in nature.  This certainly does help with the pain that he experiences in his back as well as help with his function. ?-Counseled on home exercise therapy and supportive care. ?-Could consider physical therapy. ? ?Acquired leg length discrepancy ?Acute on chronic in nature.  Has significant shortening of his left leg when compared to his right.  He has adapted to his biomechanics over time. ?-Counseled on home exercise therapy and supportive care. ?-Could consider orthotics and heel lifts or a prosthesis. ? ? ? ? ?

## 2021-07-15 NOTE — Assessment & Plan Note (Signed)
Acute on chronic in nature.  This certainly does help with the pain that he experiences in his back as well as help with his function. ?-Counseled on home exercise therapy and supportive care. ?-Could consider physical therapy. ?

## 2021-07-15 NOTE — Assessment & Plan Note (Signed)
Acutely occurring.  Does have degenerative changes of the facet joints in the lumbar region which could be contributing to some of this pain that he feels in his gluteus. ?-Counseled on home exercise therapy and supportive care. ?-Could consider further imaging for facet injections. ?

## 2021-08-22 ENCOUNTER — Other Ambulatory Visit: Payer: Self-pay | Admitting: Neurological Surgery

## 2021-08-22 DIAGNOSIS — R1901 Right upper quadrant abdominal swelling, mass and lump: Secondary | ICD-10-CM

## 2021-08-26 ENCOUNTER — Ambulatory Visit (INDEPENDENT_AMBULATORY_CARE_PROVIDER_SITE_OTHER): Payer: Medicare Other | Admitting: Family Medicine

## 2021-08-26 ENCOUNTER — Encounter: Payer: Self-pay | Admitting: Family Medicine

## 2021-08-26 VITALS — BP 130/70 | Ht 69.0 in | Wt 167.0 lb

## 2021-08-26 DIAGNOSIS — R1901 Right upper quadrant abdominal swelling, mass and lump: Secondary | ICD-10-CM | POA: Diagnosis not present

## 2021-08-26 DIAGNOSIS — M5416 Radiculopathy, lumbar region: Secondary | ICD-10-CM

## 2021-08-26 NOTE — Assessment & Plan Note (Signed)
Recent MRI by his neurosurgeon was revealing for spinal stenosis as well as facet arthrosis. ?-He has follow-up with his neurosurgeon on May 9th ?

## 2021-08-26 NOTE — Progress Notes (Signed)
?  Micheal Mcintyre - 81 y.o. male MRN [id number]  Date of birth: [birth year] ? ?SUBJECTIVE:  Including CC & ROS.  ?No chief complaint on file. ? ? ?Micheal Mcintyre is a 81 y.o. male that is following up for his radicular type pain.  He had his MRI completed which shows spinal stenosis but was also present for a large right-sided upper abdominal mass.  Denies any history of prostate cancer or colon or kidney cancer. ? ? ?Review of Systems ?See HPI  ? ?HISTORY: Past Medical, Surgical, Social, and Family History Reviewed & Updated per EMR.   ?Pertinent Historical Findings include: ? ?Past Medical History:  ?Diagnosis Date  ? Arthritis   ? GERD (gastroesophageal reflux disease)   ? Hypertension   ? Medicare annual wellness visit, subsequent 08/19/2014  ? 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 Qualifier: Diagnosis of  By: Niel Hummer MD, Lorinda Creed   ? Preventative health care 06/07/2016  ? Right shoulder pain 12/03/2016  ? ? ?Past Surgical History:  ?Procedure Laterality Date  ? CHOLECYSTECTOMY    ? HIP SURGERY    ? JOINT REPLACEMENT    ? nose skin cancer removal    ? TOTAL HIP ARTHROPLASTY    ? ? ? ?PHYSICAL EXAM:  ?VS: BP 130/70 (BP Location: Left Arm, Patient Position: Sitting)   Ht '5\' 9"'$  (1.753 m)   Wt 167 lb (75.8 kg)   BMI 24.66 kg/m?  ?Physical Exam ?Gen: NAD, alert, cooperative with exam, well-appearing ?MSK:  ?Neurovascularly intact   ? ? ? ? ?ASSESSMENT & PLAN:  ? ?Lumbar radiculopathy ?Recent MRI by his neurosurgeon was revealing for spinal stenosis as well as facet arthrosis. ?-He has follow-up with his neurosurgeon on May 9th ? ?Right upper quadrant abdominal mass ?CT scan has been ordered but he has not been contacted yet. ?-Check kidney function today due to CT with contrast. ?-Counseled on speaking with the neurosurgeon office to get the update on CT scan. ? ? ? ? ?

## 2021-08-26 NOTE — Assessment & Plan Note (Signed)
CT scan has been ordered but he has not been contacted yet. ?-Check kidney function today due to CT with contrast. ?-Counseled on speaking with the neurosurgeon office to get the update on CT scan. ?

## 2021-08-26 NOTE — Patient Instructions (Signed)
Good to see you ?Dr. Ronnald Ramp has ordered the CT scan at [facility]. Please call his office to get an update on the status of the CT scan.   ?Please send me a message in MyChart with any questions or updates.  ?Please see me back as needed.  ? ?--Dr. Raeford Razor ? ?

## 2021-09-01 ENCOUNTER — Other Ambulatory Visit: Payer: Self-pay

## 2021-09-01 ENCOUNTER — Emergency Department (HOSPITAL_BASED_OUTPATIENT_CLINIC_OR_DEPARTMENT_OTHER)
Admission: EM | Admit: 2021-09-01 | Discharge: 2021-09-01 | Disposition: A | Discharge status: Home / Self Care | Payer: Medicare Other | Attending: Emergency Medicine | Admitting: Emergency Medicine

## 2021-09-01 ENCOUNTER — Encounter (HOSPITAL_BASED_OUTPATIENT_CLINIC_OR_DEPARTMENT_OTHER): Payer: Self-pay | Admitting: Emergency Medicine

## 2021-09-01 ENCOUNTER — Emergency Department (HOSPITAL_BASED_OUTPATIENT_CLINIC_OR_DEPARTMENT_OTHER): Payer: Medicare Other

## 2021-09-01 DIAGNOSIS — I10 Essential (primary) hypertension: Secondary | ICD-10-CM | POA: Diagnosis not present

## 2021-09-01 DIAGNOSIS — R319 Hematuria, unspecified: Secondary | ICD-10-CM | POA: Insufficient documentation

## 2021-09-01 DIAGNOSIS — R310 Gross hematuria: Secondary | ICD-10-CM

## 2021-09-01 DIAGNOSIS — R300 Dysuria: Secondary | ICD-10-CM | POA: Insufficient documentation

## 2021-09-01 DIAGNOSIS — Z79899 Other long term (current) drug therapy: Secondary | ICD-10-CM | POA: Insufficient documentation

## 2021-09-01 DIAGNOSIS — N2889 Other specified disorders of kidney and ureter: Secondary | ICD-10-CM

## 2021-09-01 LAB — URINALYSIS, ROUTINE W REFLEX MICROSCOPIC

## 2021-09-01 LAB — CBC
HCT: 39.4 % (ref 39.0–52.0)
Hemoglobin: 13.2 g/dL (ref 13.0–17.0)
MCH: 31.2 pg (ref 26.0–34.0)
MCHC: 33.5 g/dL (ref 30.0–36.0)
MCV: 93.1 fL (ref 80.0–100.0)
Platelets: 166 10*3/uL (ref 150–400)
RBC: 4.23 MIL/uL (ref 4.22–5.81)
RDW: 12.6 % (ref 11.5–15.5)
WBC: 6.6 10*3/uL (ref 4.0–10.5)
nRBC: 0 % (ref 0.0–0.2)

## 2021-09-01 LAB — BASIC METABOLIC PANEL
Anion gap: 6 (ref 5–15)
BUN: 20 mg/dL (ref 8–23)
CO2: 26 mmol/L (ref 22–32)
Calcium: 9 mg/dL (ref 8.9–10.3)
Chloride: 106 mmol/L (ref 98–111)
Creatinine, Ser: 1.18 mg/dL (ref 0.61–1.24)
GFR, Estimated: >60 mL/min (ref >60)
Glucose, Bld: 118 mg/dL — ABNORMAL HIGH (ref 70–99)
Potassium: 4.6 mmol/L (ref 3.5–5.1)
Sodium: 138 mmol/L (ref 135–145)

## 2021-09-01 LAB — URINALYSIS, MICROSCOPIC (REFLEX): RBC / HPF: >50 RBC/hpf (ref 0–5)

## 2021-09-01 MED ORDER — SODIUM CHLORIDE 0.9 % IV SOLN
1.0000 g | Freq: Once | INTRAVENOUS | Status: AC
  Administered 2021-09-01: 1 g via INTRAVENOUS
  Filled 2021-09-01: qty 10

## 2021-09-01 MED ORDER — IOHEXOL 300 MG/ML  SOLN
100.0000 mL | Freq: Once | INTRAMUSCULAR | Status: AC | PRN
  Administered 2021-09-01: 100 mL via INTRAVENOUS

## 2021-09-01 NOTE — ED Provider Notes (Signed)
?[facility] ?Provider Note ? ? ?CSN: [id number] ?Arrival date & time: 09/01/21  0331 ? ?  ? ?History ? ?Chief Complaint  ?Patient presents with  ? Hematuria  ? ? ?Micheal Mcintyre is a 81 y.o. male. ? ?The history is provided by the patient and the spouse.  ?Hematuria ?This is a new problem. The problem has been gradually worsening. Pertinent negatives include no abdominal pain. Nothing relieves the symptoms.  ?Patient reports he was at a concert yesterday in [facility] when he noted to have blood in his urine.  He reports it later cleared up, but then returned.  He reports mild dysuria.  No fevers or vomiting.  No trauma. ?Patient reports he is scheduled for CT imaging tomorrow due to a recent mass found on MRI ?Patient has had back pain and spinal stenosis recently ?  ? ?Home Medications ?Prior to Admission medications   ?Medication Sig Start Date End Date Taking? Authorizing Provider  ?Krill Oil 300 MG CAPS Take 1 capsule by mouth daily.    [provider]  ?rosuvastatin (CRESTOR) 5 MG tablet Take 1 tablet (5 mg total) by mouth daily. ?Patient not taking: Reported on 01/08/2021 08/05/20   [name], MD  ?sildenafil (VIAGRA) 50 MG tablet Take 1 tablet (50 mg total) by mouth daily as needed. 03/30/16   [name], MD  ?telmisartan (MICARDIS) 80 MG tablet TAKE 1/2 TABLET BY MOUTH TWICE DAILY 10/10/20   [name], MD  ?   ? ?Allergies    ?Patient has no known allergies.   ? ?Review of Systems   ?Review of Systems  ?Constitutional:  Negative for fever.  ?Gastrointestinal:  Negative for abdominal pain and vomiting.  ?Genitourinary:  Positive for dysuria and hematuria.  ? ?Physical Exam ?Updated Vital Signs ?BP (!) 171/84   Pulse 73   Temp 98.3 ?F (36.8 ?C)   Resp 18   Ht 1.753 m ('5\' 9"'$ )   Wt 74.8 kg   SpO2 99%   BMI 24.37 kg/m?  ?Physical Exam ?CONSTITUTIONAL elderly, no acute distress ?HEAD: Normocephalic/atraumatic ?EYES: EOMI ?ENMT: Mucous membranes moist ?NECK:  supple no meningeal signs ?CV: S1/S2 noted ?LUNGS: Lungs are clear to auscultation bilaterally, no apparent distress ?ABDOMEN: soft, nontender, no rebound or guarding, bowel sounds noted throughout abdomen ?GU:no cva tenderness ?NEURO: Pt is awake/alert/appropriate, moves all extremitiesx4.  No facial droop.   ?SKIN: warm, color normal ?PSYCH: no abnormalities of mood noted, alert and oriented to situation ? ?ED Results / Procedures / Treatments   ?Labs ?(all labs ordered are listed, but only abnormal results are displayed) ?Labs Reviewed  ?URINALYSIS, ROUTINE W REFLEX MICROSCOPIC - Abnormal; Notable for the following components:  ?    Result Value  ? Color, Urine RED (*)   ? APPearance TURBID (*)   ? Glucose, UA   (*)   ? Value: TEST NOT REPORTED DUE TO COLOR INTERFERENCE OF URINE PIGMENT  ? Hgb urine dipstick   (*)   ? Value: TEST NOT REPORTED DUE TO COLOR INTERFERENCE OF URINE PIGMENT  ? Bilirubin Urine   (*)   ? Value: TEST NOT REPORTED DUE TO COLOR INTERFERENCE OF URINE PIGMENT  ? Ketones, ur   (*)   ? Value: TEST NOT REPORTED DUE TO COLOR INTERFERENCE OF URINE PIGMENT  ? Protein, ur   (*)   ? Value: TEST NOT REPORTED DUE TO COLOR INTERFERENCE OF URINE PIGMENT  ? Nitrite   (*)   ?  Value: TEST NOT REPORTED DUE TO COLOR INTERFERENCE OF URINE PIGMENT  ? Leukocytes,Ua   (*)   ? Value: TEST NOT REPORTED DUE TO COLOR INTERFERENCE OF URINE PIGMENT  ? All other components within normal limits  ?BASIC METABOLIC PANEL - Abnormal; Notable for the following components:  ? Glucose, Bld 118 (*)   ? All other components within normal limits  ?URINALYSIS, MICROSCOPIC (REFLEX) - Abnormal; Notable for the following components:  ? Bacteria, UA MANY (*)   ? Crystals CHOLESTEROL CRYSTALS PRESENT (*)   ? All other components within normal limits  ?URINE CULTURE  ?CBC  ? ? ?EKG ?None ? ?Radiology ?No results found. ? ?Procedures ?Procedures  ? ? ?Medications Ordered in ED ?Medications  ?cefTRIAXone (ROCEPHIN) 1 g in sodium chloride  0.9 % 100 mL IVPB (1 g Intravenous New Bag/Given 09/01/21 0641)  ? ? ?ED Course/ Medical Decision Making/ A&P ?Clinical Course as of 09/01/21 0653  ?Mon Sep 01, 2021  ?0629 Outside MRI results have been found.  Patient found to have right upper quadrant mass of unclear etiology.  Patient was scheduled to have CT abdomen pelvis tomorrow [DW]  ?[phone] Signed out to dr long at shift change to f/u on CT imaging.  May need antibiotics at shift change [DW]  ?  ?Clinical Course User Index ?[DW] Ripley Fraise, MD  ? ?                        ?Medical Decision Making ?Amount and/or Complexity of Data Reviewed ?Labs: ordered. ?Radiology: ordered. ? ? ?This patient presents to the ED for concern of hematuria, this involves an extensive number of treatment options, and is a complaint that carries with it a high risk of complications and morbidity.  The differential diagnosis includes but is not limited to UTI, kidney stone, pyelonephritis, renal mass ? ?Comorbidities that complicate the patient evaluation: ?Patient?s presentation is complicated by their history of hypertension ? ? ?Additional history obtained: ?Additional history obtained from spouse ?Records reviewed  sports medicine notes reviewed, MRI results reviewed ? ?Lab Tests: ?I Ordered, and personally interpreted labs.  The pertinent results include: Hematuria ? ?Imaging Studies ordered: ?I ordered imaging studies including CT scan abdomen pelvis   ? ? ?Medicines ordered and prescription drug management: ?I ordered medication including Rocephin for possible UTI ? ?Complexity of problems addressed: ?Patient?s presentation is most consistent with  acute presentation with potential threat to life or bodily function ? ? ? ? ? ? ? ? ?Final Clinical Impression(s) / ED Diagnoses ?Final diagnoses:  ?None  ? ? ?Rx / DC Orders ?ED Discharge Orders   ? ? None  ? ?  ? ? ?  ?Ripley Fraise, MD ?09/01/21 [phone] ? ?

## 2021-09-01 NOTE — Discharge Instructions (Signed)
You were seen in the emergency room today with blood in the urine and back pain.  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  I have discussed your case with the urologist and they will be calling you for an appointment.  If you do not hear from them by tomorrow please call the office listed to schedule a follow-up appointment as soon as possible.  ? ?Return to the emergency department if you develop fever, worsening pain, or inability to urinate. ?

## 2021-09-01 NOTE — ED Provider Notes (Signed)
Blood pressure (!) 171/84, pulse 73, temperature 98.3 ?F (36.8 ?C), resp. rate 18, height '5\' 9"'$  (1.753 m), weight 74.8 kg, SpO2 99 %. ? ?Assuming care from Dr. Christy Gentles.  In short, Micheal Mcintyre is a 81 y.o. male with a chief complaint of Hematuria ?Marland Kitchen  Refer to the original H&P for additional details. ? ?The current plan of care is to follow up on CT and reassess. ? ?08:57 AM  ?CT imaging reviewed and discussed with Dr. Jeffie Pollock.  Reviewed the case and CT imaging with me.  Dr. Jeffie Pollock will arrange close outpatient follow-up with one of his colleagues.  ? ?09:06 AM ?Spoke with patient regarding the CT scans along with plan for urology follow-up.  Urine has been sent for culture.  No other UTI symptoms.  He is not having retention symptoms.  Hold on Foley catheter placement for now but discussed strict ED return precautions as well as importance of close urology follow-up. ? ?  ?Margette Fast, MD ?09/01/21 [phone] ? ?

## 2021-09-01 NOTE — ED Triage Notes (Signed)
Pt states he had an MRI that showed large R sided upper abd mass (per note from 4/25). Pt states he has CT scan ordered for Tuesday of this week but started having hematuria last night at 2030. He does endorse dysuria as well. Denies fever or other symptoms.  ?

## 2021-09-01 NOTE — ED Notes (Signed)
Patient transported to CT 

## 2021-09-02 ENCOUNTER — Ambulatory Visit (HOSPITAL_COMMUNITY)
Admission: RE | Admit: 2021-09-02 | Discharge: 2021-09-02 | Disposition: A | Discharge status: Home / Self Care | Payer: Medicare Other | Source: Ambulatory Visit | Attending: Urology | Admitting: Urology

## 2021-09-02 ENCOUNTER — Other Ambulatory Visit (HOSPITAL_COMMUNITY): Payer: Self-pay | Admitting: Urology

## 2021-09-02 ENCOUNTER — Ambulatory Visit: Admission: RE | Admit: 2021-09-02 | Payer: Medicare Other | Source: Ambulatory Visit

## 2021-09-02 ENCOUNTER — Other Ambulatory Visit: Payer: Self-pay | Admitting: Urology

## 2021-09-02 DIAGNOSIS — D49511 Neoplasm of unspecified behavior of right kidney: Secondary | ICD-10-CM | POA: Diagnosis not present

## 2021-09-02 DIAGNOSIS — I82220 Acute embolism and thrombosis of inferior vena cava: Secondary | ICD-10-CM | POA: Insufficient documentation

## 2021-09-02 LAB — URINE CULTURE: Culture: NO GROWTH

## 2021-09-22 ENCOUNTER — Encounter: Payer: Self-pay | Admitting: Family Medicine

## 2021-09-22 ENCOUNTER — Ambulatory Visit (INDEPENDENT_AMBULATORY_CARE_PROVIDER_SITE_OTHER): Payer: Medicare Other | Admitting: Family Medicine

## 2021-09-22 ENCOUNTER — Ambulatory Visit: Payer: Medicare Other | Admitting: Family Medicine

## 2021-09-22 VITALS — BP 120/62 | HR 68 | Resp 20 | Ht 69.0 in | Wt 162.0 lb

## 2021-09-22 DIAGNOSIS — R739 Hyperglycemia, unspecified: Secondary | ICD-10-CM

## 2021-09-22 DIAGNOSIS — I10 Essential (primary) hypertension: Secondary | ICD-10-CM | POA: Diagnosis not present

## 2021-09-22 DIAGNOSIS — E559 Vitamin D deficiency, unspecified: Secondary | ICD-10-CM

## 2021-09-22 DIAGNOSIS — E782 Mixed hyperlipidemia: Secondary | ICD-10-CM

## 2021-09-22 DIAGNOSIS — E039 Hypothyroidism, unspecified: Secondary | ICD-10-CM

## 2021-09-22 LAB — LIPID PANEL
Cholesterol: 164 mg/dL (ref 0–200)
HDL: 54 mg/dL (ref >39.00)
LDL Cholesterol: 93 mg/dL (ref 0–99)
NonHDL: 110.47
Total CHOL/HDL Ratio: 3
Triglycerides: 85 mg/dL (ref 0.0–149.0)
VLDL: 17 mg/dL (ref 0.0–40.0)

## 2021-09-22 LAB — HEMOGLOBIN A1C: Hgb A1c MFr Bld: 6.1 % (ref 4.6–6.5)

## 2021-09-22 LAB — TSH: TSH: 1.66 u[IU]/mL (ref 0.35–5.50)

## 2021-09-22 LAB — CBC
HCT: 38.1 % — ABNORMAL LOW (ref 39.0–52.0)
Hemoglobin: 12.6 g/dL — ABNORMAL LOW (ref 13.0–17.0)
MCHC: 33.1 g/dL (ref 30.0–36.0)
MCV: 95.1 fl (ref 78.0–100.0)
Platelets: 151 10*3/uL (ref 150.0–400.0)
RBC: 4.01 Mil/uL — ABNORMAL LOW (ref 4.22–5.81)
RDW: 13.4 % (ref 11.5–15.5)
WBC: 5.4 10*3/uL (ref 4.0–10.5)

## 2021-09-22 LAB — VITAMIN D 25 HYDROXY (VIT D DEFICIENCY, FRACTURES): VITD: 25.26 ng/mL — ABNORMAL LOW (ref 30.00–100.00)

## 2021-09-22 LAB — COMPREHENSIVE METABOLIC PANEL
ALT: 21 U/L (ref 0–53)
AST: 20 U/L (ref 0–37)
Albumin: 4 g/dL (ref 3.5–5.2)
Alkaline Phosphatase: 88 U/L (ref 39–117)
BUN: 20 mg/dL (ref 6–23)
CO2: 29 mEq/L (ref 19–32)
Calcium: 9.3 mg/dL (ref 8.4–10.5)
Chloride: 106 mEq/L (ref 96–112)
Creatinine, Ser: 1.18 mg/dL (ref 0.40–1.50)
GFR: 59.11 mL/min — ABNORMAL LOW (ref >60.00)
Glucose, Bld: 107 mg/dL — ABNORMAL HIGH (ref 70–99)
Potassium: 5 mEq/L (ref 3.5–5.1)
Sodium: 139 mEq/L (ref 135–145)
Total Bilirubin: 0.7 mg/dL (ref 0.2–1.2)
Total Protein: 6.1 g/dL (ref 6.0–8.3)

## 2021-09-22 NOTE — Assessment & Plan Note (Signed)
Currently only taking multivitamins. Will recheck labs today.

## 2021-09-22 NOTE — Assessment & Plan Note (Signed)
Blood pressure is at goal for age and co-morbidities.  I recommend continuing telmisartan 40 mg BID.   - BP goal <130/80 - monitor and log blood pressures at home - check around the same time each day in a relaxed setting - Limit salt to <2000 mg/day - Follow DASH eating plan (heart healthy diet) - limit alcohol to 2 standard drinks per day for men and 1 per day for women - avoid tobacco products - get at least 2 hours of regular aerobic exercise weekly Patient aware of signs/symptoms requiring further/urgent evaluation. Labs updated today.

## 2021-09-22 NOTE — Patient Instructions (Signed)
Good to see you today! Updating labs - for now continue all of your medications as prescribed. We will let you know results and any changes.   Good luck with your upcoming surgery! You will be in my prayers!

## 2021-09-22 NOTE — Assessment & Plan Note (Signed)
Well controlled with last A1c% Continue lifestyle measures  Discussed diet and exercise F/u in 6 months

## 2021-09-22 NOTE — Progress Notes (Signed)
Established Patient Office Visit  Subjective   Patient ID: Micheal Mcintyre, male    DOB: 24-Oct-1940  Age: 81 y.o. MRN: [id number]  CC: 68-monthf/u    HPI  Patient reports he saw a back specialist for his chronic back pain recently and got an MRI done - they saw suspicious area on kidney. Reports a few days later he started urinating a lot of blood - went to the ED, got CT scan and it showed renal mass. He was referred to urologist Dr. 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 He is still doing swimming for exercise, but otherwise taking it easy. Reports he is feeling alright overall.    HYPERTENSION: - Medications: telmisartan 40 mg BID - Compliance: good - Checking BP at home: occasionally, well controlled usually  - Denies any SOB, recurrent headaches, CP, vision changes, LE edema, dizziness, palpitations, or medication side effects. - Diet: heart healthy - Exercise: swimming - Stressors: upcoming surgery - very good outlook overall   Hypothyroidism Follow-up: -No medications prescribed  -No recent changes to hair, skin, nails, energy levels  HYPERLIPIDEMIA - medications: Crestor 5 mg daily - stopped about 6 months ago because of cramping; krill oil daily - compliance: good - medication SEs: n/a The 10-year ASCVD risk score (Arnett DK, et al., 2019) is: 29.4%   Values used to calculate the score:     Age: 89years     Sex: Male     Is Non-Hispanic African American: No     Diabetic: No     Tobacco smoker: No     Systolic Blood Pressure: 1381mmHg     Is BP treated: Yes     HDL Cholesterol: 48.2 mg/dL     Total Cholesterol: 161 mg/dL   VITAMIN D DEFICIENCY: - taking a multivitamin only    HYPERGLYCEMIA/PRE-DIABETES: - Checking BG at home: no - Medications: none - Compliance: n/a - Diet: healthy, balanced  - Exercise: swimming regularly  - Denies symptoms of hypoglycemia, polyuria, polydipsia, numbness extremities, foot ulcers/trauma, wounds that are  not healing, medication side effects        ROS All review of systems negative except what is listed in the HPI    Objective:     BP 120/62 (BP Location: Left Arm, Patient Position: Sitting, Cuff Size: Normal)   Pulse 68   Resp 20   Ht '5\' 9"'$  (1.753 m)   Wt 162 lb (73.5 kg)   SpO2 98%   BMI 23.92 kg/m    Physical Exam Vitals reviewed.  Constitutional:      Appearance: Normal appearance. He is normal weight.  HENT:     Head: Normocephalic and atraumatic.  Cardiovascular:     Rate and Rhythm: Normal rate and regular rhythm.  Pulmonary:     Effort: Pulmonary effort is normal.     Breath sounds: Normal breath sounds.  Musculoskeletal:     Cervical back: Normal range of motion and neck supple. No tenderness.  Lymphadenopathy:     Cervical: No cervical adenopathy.  Skin:    General: Skin is warm and dry.  Neurological:     General: No focal deficit present.     Mental Status: He is alert and oriented to person, place, and time. Mental status is at baseline.  Psychiatric:        Mood and Affect: Mood normal.        Behavior: Behavior normal.  Thought Content: Thought content normal.        Judgment: Judgment normal.     No results found for any visits on 09/22/21.    The 10-year ASCVD risk score (Arnett DK, et al., 2019) is: 29.4%    Assessment & Plan:   Problem List Items Addressed This Visit       Cardiovascular and Mediastinum   Essential hypertension - Primary    Blood pressure is at goal for age and co-morbidities.  I recommend continuing telmisartan 40 mg BID.   - BP goal <130/80 - monitor and log blood pressures at home - check around the same time each day in a relaxed setting - Limit salt to <2000 mg/day - Follow DASH eating plan (heart healthy diet) - limit alcohol to 2 standard drinks per day for men and 1 per day for women - avoid tobacco products - get at least 2 hours of regular aerobic exercise weekly Patient aware of  signs/symptoms requiring further/urgent evaluation. Labs updated today.       Relevant Orders   CBC   Comprehensive metabolic panel   Lipid panel   TSH     Endocrine   Hypothyroidism    Not currently on medication. Asymptomatic. Updating labs today.        Relevant Orders   TSH     Other   Hyperlipidemia, mixed    HLD PLAN: -Reviewed most recent lipid panel -Medication management: continue krill oil supplementation (stopped Crestor about 6 months ago due to cramping) -Repeat CMP and lipid panel today -Diet low in saturated fat -Regular exercise - at least 30 minutes, 5 times per week       Relevant Orders   Hemoglobin A1c   Hyperglycemia    Well controlled with last A1c% Continue lifestyle measures  Discussed diet and exercise F/u in 6 months       Relevant Orders   Hemoglobin A1c   Comprehensive metabolic panel   Lipid panel   Vitamin D deficiency    Currently only taking multivitamins. Will recheck labs today.        Relevant Orders   VITAMIN D 25 Hydroxy (Vit-D Deficiency, Fractures)    Return in about 6 months (around 03/25/2022) for routine f/u, CPE.    Terrilyn Saver, NP

## 2021-09-22 NOTE — Assessment & Plan Note (Signed)
Not currently on medication. Asymptomatic. Updating labs today.

## 2021-09-22 NOTE — Assessment & Plan Note (Signed)
HLD PLAN: -Reviewed most recent lipid panel -Medication management: continue krill oil supplementation (stopped Crestor about 6 months ago due to cramping) -Repeat CMP and lipid panel today -Diet low in saturated fat -Regular exercise - at least 30 minutes, 5 times per week

## 2021-09-24 NOTE — Progress Notes (Addendum)
Anesthesia Review:  PCP: Caleen Jobs, NP - LOV 09/22/21  Cardiologist : Chest x-ray : 09/02/21  EKG : 10/02/21  Echo : 2021  Stress test: Cardiac Cath :  Activity level: can do a flight of stiars without difficulty  Sleep Study/ CPAP : none  Fasting Blood Sugar :      / Checks Blood Sugar -- times a day:   Blood Thinner/ Instructions /Last Dose: ASA / Instructions/ Last Dose :   09/22/21- cbc, cmp and hgba1c- epic

## 2021-09-24 NOTE — Progress Notes (Addendum)
DUE TO COVID-19 ONLY  2  VISITOR IS ALLOWED TO COME WITH YOU AND STAY IN THE WAITING ROOM ONLY DURING PRE OP AND PROCEDURE DAY OF SURGERY.   4 VISITOR  MAY VISIT WITH YOU AFTER SURGERY IN YOUR PRIVATE ROOM DURING VISITING HOURS ONLY! YOU MAY HAVE ONE PERSON SPEND THE NITE WITH YOU IN YOUR ROOM AFTER SURGERY.       Your procedure is scheduled on:    10/15/2021   Report to [facility] Main  Entrance   Report to admitting at      0615          AM DO NOT [place], PICTURE ID OR WALLET DAY OF SURGERY.      Call this number if you have problems the morning of surgery [phone]    Clear liquid diet the day before surgery.   Mix 17 grams of Miralax in 4 ounces of water and drink at 12 noon day before surgery.    REMEMBER: NO  SOLID FOODS , CANDY, GUM OR MINTS AFTER MIDNITE THE NITE BEFORE SURGERY .       Marland Kitchen CLEAR LIQUIDS UNTIL       0515am          DAY OF SURGERY.            CLEAR LIQUID DIET   Foods Allowed      WATER BLACK COFFEE ( SUGAR OK, NO MILK, CREAM OR CREAMER) REGULAR AND DECAF  TEA ( SUGAR OK NO MILK, CREAM, OR CREAMER) REGULAR AND DECAF  PLAIN JELLO ( NO RED)  FRUIT ICES ( NO RED, NO FRUIT PULP)  POPSICLES ( NO RED)  JUICE- APPLE, WHITE GRAPE AND WHITE CRANBERRY  SPORT DRINK LIKE GATORADE ( NO RED)  CLEAR BROTH ( VEGETABLE , CHICKEN OR BEEF)                                                                     BRUSH YOUR TEETH MORNING OF SURGERY AND RINSE YOUR MOUTH OUT, NO CHEWING GUM CANDY OR MINTS.     Take these medicines the morning of surgery with A SIP OF WATER:  none    DO NOT TAKE ANY DIABETIC MEDICATIONS DAY OF YOUR SURGERY                               You may not have any metal on your body including hair pins and              piercings  Do not wear jewelry, make-up, lotions, powders or perfumes, deodorant             Do not wear nail polish on your fingernails.              IF YOU ARE A MALE AND WANT TO SHAVE UNDER ARMS OR LEGS  PRIOR TO SURGERY YOU MUST DO SO AT LEAST 48 HOURS PRIOR TO SURGERY.              Men may shave face and neck.   Do not bring valuables to the hospital. [facility] IS NOT  RESPONSIBLE   FOR VALUABLES.  Contacts, dentures or bridgework may not be worn into surgery.  Leave suitcase in the car. After surgery it may be brought to your room.     Patients discharged the day of surgery will not be allowed to drive home. IF YOU ARE HAVING SURGERY AND GOING HOME THE SAME DAY, YOU MUST HAVE AN ADULT TO DRIVE YOU HOME AND BE WITH YOU FOR 24 HOURS. YOU MAY GO HOME BY TAXI OR UBER OR ORTHERWISE, BUT AN ADULT MUST ACCOMPANY YOU HOME AND STAY WITH YOU FOR 24 HOURS.                Please read over the following fact sheets you were given: _____________________________________________________________________  [facility] - Preparing for Surgery Before surgery, you can play an important role.  Because skin is not sterile, your skin needs to be as free of germs as possible.  You can reduce the number of germs on your skin by washing with CHG (chlorahexidine gluconate) soap before surgery.  CHG is an antiseptic cleaner which kills germs and bonds with the skin to continue killing germs even after washing. Please DO NOT use if you have an allergy to CHG or antibacterial soaps.  If your skin becomes reddened/irritated stop using the CHG and inform your nurse when you arrive at Short Stay. Do not shave (including legs and underarms) for at least 48 hours prior to the first CHG shower.  You may shave your face/neck. Please follow these instructions carefully:  1.  Shower with CHG Soap the night before surgery and the  morning of Surgery.  2.  If you choose to wash your hair, wash your hair first as usual with your  normal  shampoo.  3.  After you shampoo, rinse your hair and body thoroughly to remove the  shampoo.                           4.  Use CHG as you would any other liquid soap.  You can apply chg  directly  to the skin and wash                       Gently with a scrungie or clean washcloth.  5.  Apply the CHG Soap to your body ONLY FROM THE NECK DOWN.   Do not use on face/ open                           Wound or open sores. Avoid contact with eyes, ears mouth and genitals (private parts).                       Wash face,  Genitals (private parts) with your normal soap.             6.  Wash thoroughly, paying special attention to the area where your surgery  will be performed.  7.  Thoroughly rinse your body with warm water from the neck down.  8.  DO NOT shower/wash with your normal soap after using and rinsing off  the CHG Soap.                9.  Pat yourself dry with a clean towel.            10.  Wear clean pajamas.  11.  Place clean sheets on your bed the night of your first shower and do not  sleep with pets. Day of Surgery : Do not apply any lotions/deodorants the morning of surgery.  Please wear clean clothes to the hospital/surgery center.  FAILURE TO FOLLOW THESE INSTRUCTIONS MAY RESULT IN THE CANCELLATION OF YOUR SURGERY PATIENT SIGNATURE_________________________________  NURSE SIGNATURE__________________________________  ________________________________________________________________________

## 2021-09-25 ENCOUNTER — Other Ambulatory Visit: Payer: Self-pay

## 2021-09-25 ENCOUNTER — Telehealth: Payer: Self-pay

## 2021-09-25 DIAGNOSIS — E559 Vitamin D deficiency, unspecified: Secondary | ICD-10-CM

## 2021-09-25 NOTE — Telephone Encounter (Signed)
error 

## 2021-09-25 NOTE — Addendum Note (Signed)
Addended by: Randolm Idol A on: 09/25/2021 11:51 AM   Modules accepted: Orders

## 2021-09-26 ENCOUNTER — Other Ambulatory Visit (HOSPITAL_COMMUNITY): Payer: Self-pay

## 2021-10-02 ENCOUNTER — Other Ambulatory Visit: Payer: Self-pay

## 2021-10-02 ENCOUNTER — Encounter (HOSPITAL_COMMUNITY)
Admission: RE | Admit: 2021-10-02 | Discharge: 2021-10-02 | Disposition: A | Discharge status: Home / Self Care | Payer: Medicare Other | Source: Ambulatory Visit | Attending: Urology | Admitting: Urology

## 2021-10-02 ENCOUNTER — Encounter (HOSPITAL_COMMUNITY): Payer: Self-pay

## 2021-10-02 VITALS — BP 135/67 | HR 81 | Temp 98.2°F | Resp 16 | Ht 69.0 in | Wt 162.0 lb

## 2021-10-02 DIAGNOSIS — Z01818 Encounter for other preprocedural examination: Secondary | ICD-10-CM | POA: Insufficient documentation

## 2021-10-02 HISTORY — DX: Nausea with vomiting, unspecified: R11.2

## 2021-10-02 HISTORY — DX: Nausea with vomiting, unspecified: Z98.890

## 2021-10-02 LAB — TYPE AND SCREEN
ABO/RH(D): O POS
Antibody Screen: NEGATIVE

## 2021-10-13 ENCOUNTER — Other Ambulatory Visit: Payer: Self-pay

## 2021-10-13 MED ORDER — TELMISARTAN 80 MG PO TABS: 40.0000 mg | ORAL_TABLET | Freq: Two times a day (BID) | ORAL | 1 refills | Status: DC

## 2021-10-14 NOTE — Anesthesia Preprocedure Evaluation (Addendum)
Anesthesia Evaluation  Patient identified by MRN, date of birth, ID band Patient awake    Reviewed: Allergy & Precautions, H&P , NPO status , Patient's Chart, lab work & pertinent test results  History of Anesthesia Complications (+) PONV and history of anesthetic complications  Airway Mallampati: II  TM Distance: >3 FB Neck ROM: Full    Dental no notable dental hx. (+) Teeth Intact, Dental Advisory Given, Caps   Pulmonary neg pulmonary ROS,    Pulmonary exam normal breath sounds clear to auscultation       Cardiovascular Exercise Tolerance: Good hypertension, Pt. on medications negative cardio ROS Normal cardiovascular exam Rhythm:Regular Rate:Normal     Neuro/Psych  Neuromuscular disease negative neurological ROS  negative psych ROS   GI/Hepatic negative GI ROS, Neg liver ROS, GERD  Medicated,  Endo/Other  negative endocrine ROSHypothyroidism   Renal/GU negative Renal ROS  negative genitourinary   Musculoskeletal negative musculoskeletal ROS (+) Arthritis , Osteoarthritis,    Abdominal   Peds negative pediatric ROS (+)  Hematology negative hematology ROS (+) Blood dyscrasia, anemia ,   Anesthesia Other Findings ECHO 11/21 1. Left ventricular ejection fraction, by estimation, is 60 to 65%. The  left ventricle has normal function. The left ventricle has no regional  wall motion abnormalities.  2. Right ventricular systolic function is normal. The right ventricular  size is normal. There is normal pulmonary artery systolic pressure.  3. The mitral valve is normal in structure. No evidence of mitral valve  regurgitation. No evidence of mitral stenosis.  4. The aortic valve is normal in structure. Aortic valve regurgitation is  not visualized. No aortic stenosis is present.  5. The inferior vena cava is normal in size with greater than 50%  respiratory variability, suggesting right atrial pressure of 3 mmHg.    Reproductive/Obstetrics negative OB ROS                           Anesthesia Physical Anesthesia Plan  ASA: 3  Anesthesia Plan: General   Post-op Pain Management: Tylenol PO (pre-op)*, Celebrex PO (pre-op)* and Dilaudid IV   Induction: Intravenous  PONV Risk Score and Plan: 3 and Ondansetron, Dexamethasone, Midazolam and Treatment may vary due to age or medical condition  Airway Management Planned: Oral ETT  Additional Equipment: None  Intra-op Plan:   Post-operative Plan: Extubation in OR  Informed Consent: I have reviewed the patients History and Physical, chart, labs and discussed the procedure including the risks, benefits and alternatives for the proposed anesthesia with the patient or authorized representative who has indicated his/her understanding and acceptance.       Plan Discussed with: Anesthesiologist and CRNA  Anesthesia Plan Comments: (  )        Anesthesia Quick Evaluation

## 2021-10-15 ENCOUNTER — Other Ambulatory Visit: Payer: Self-pay

## 2021-10-15 ENCOUNTER — Encounter (HOSPITAL_COMMUNITY)
Admission: RE | Disposition: A | Discharge status: Home / Self Care | Payer: Self-pay | Source: Home / Self Care | Attending: Urology

## 2021-10-15 ENCOUNTER — Encounter (HOSPITAL_COMMUNITY): Payer: Self-pay | Admitting: Urology

## 2021-10-15 ENCOUNTER — Inpatient Hospital Stay (HOSPITAL_COMMUNITY)
Admission: RE | Admit: 2021-10-15 | Discharge: 2021-10-17 | DRG: 658 | Disposition: A | Discharge status: Home / Self Care | Payer: Medicare Other | Attending: Urology | Admitting: Urology

## 2021-10-15 ENCOUNTER — Other Ambulatory Visit (HOSPITAL_COMMUNITY): Payer: Self-pay

## 2021-10-15 ENCOUNTER — Inpatient Hospital Stay (HOSPITAL_COMMUNITY): Payer: Medicare Other | Admitting: Anesthesiology

## 2021-10-15 ENCOUNTER — Inpatient Hospital Stay (HOSPITAL_COMMUNITY): Payer: Medicare Other | Admitting: Physician Assistant

## 2021-10-15 DIAGNOSIS — I10 Essential (primary) hypertension: Secondary | ICD-10-CM | POA: Diagnosis present

## 2021-10-15 DIAGNOSIS — Z8249 Family history of ischemic heart disease and other diseases of the circulatory system: Secondary | ICD-10-CM

## 2021-10-15 DIAGNOSIS — K219 Gastro-esophageal reflux disease without esophagitis: Secondary | ICD-10-CM | POA: Diagnosis present

## 2021-10-15 DIAGNOSIS — N2889 Other specified disorders of kidney and ureter: Secondary | ICD-10-CM | POA: Diagnosis present

## 2021-10-15 DIAGNOSIS — D638 Anemia in other chronic diseases classified elsewhere: Secondary | ICD-10-CM | POA: Diagnosis not present

## 2021-10-15 DIAGNOSIS — E039 Hypothyroidism, unspecified: Secondary | ICD-10-CM

## 2021-10-15 DIAGNOSIS — C641 Malignant neoplasm of right kidney, except renal pelvis: Principal | ICD-10-CM | POA: Diagnosis present

## 2021-10-15 DIAGNOSIS — Z85828 Personal history of other malignant neoplasm of skin: Secondary | ICD-10-CM

## 2021-10-15 DIAGNOSIS — Z96649 Presence of unspecified artificial hip joint: Secondary | ICD-10-CM | POA: Diagnosis present

## 2021-10-15 DIAGNOSIS — Z833 Family history of diabetes mellitus: Secondary | ICD-10-CM

## 2021-10-15 DIAGNOSIS — Z01818 Encounter for other preprocedural examination: Secondary | ICD-10-CM

## 2021-10-15 HISTORY — PX: NEPHRECTOMY: SHX65

## 2021-10-15 LAB — HEMOGLOBIN AND HEMATOCRIT, BLOOD
HCT: 30.4 % — ABNORMAL LOW (ref 39.0–52.0)
Hemoglobin: 10 g/dL — ABNORMAL LOW (ref 13.0–17.0)

## 2021-10-15 LAB — ABO/RH: ABO/RH(D): O POS

## 2021-10-15 SURGERY — NEPHRECTOMY
Anesthesia: General | Site: Abdomen | Laterality: Right

## 2021-10-15 MED ORDER — FENTANYL CITRATE PF 50 MCG/ML IJ SOSY: 25.0000 ug | PREFILLED_SYRINGE | INTRAMUSCULAR | Status: DC | PRN

## 2021-10-15 MED ORDER — SUGAMMADEX SODIUM 200 MG/2ML IV SOLN
INTRAVENOUS | Status: DC | PRN
  Administered 2021-10-15: 400 mg via INTRAVENOUS

## 2021-10-15 MED ORDER — DIPHENHYDRAMINE HCL 50 MG/ML IJ SOLN: 12.5000 mg | Freq: Four times a day (QID) | INTRAMUSCULAR | Status: DC | PRN

## 2021-10-15 MED ORDER — 0.9 % SODIUM CHLORIDE (POUR BTL) OPTIME
TOPICAL | Status: DC | PRN
  Administered 2021-10-15: 2000 mL

## 2021-10-15 MED ORDER — OXYCODONE HCL 5 MG PO TABS
5.0000 mg | ORAL_TABLET | ORAL | Status: DC | PRN
  Administered 2021-10-15 – 2021-10-17 (×5): 5 mg via ORAL
  Filled 2021-10-15 (×5): qty 1

## 2021-10-15 MED ORDER — DEXAMETHASONE SODIUM PHOSPHATE 10 MG/ML IJ SOLN
INTRAMUSCULAR | Status: AC
  Filled 2021-10-15: qty 1

## 2021-10-15 MED ORDER — GLYCOPYRROLATE 0.2 MG/ML IJ SOLN
INTRAMUSCULAR | Status: DC | PRN
  Administered 2021-10-15: .2 mg via INTRAVENOUS

## 2021-10-15 MED ORDER — CEFAZOLIN SODIUM-DEXTROSE 2-4 GM/100ML-% IV SOLN
2.0000 g | INTRAVENOUS | Status: AC
  Administered 2021-10-15: 2 g via INTRAVENOUS
  Filled 2021-10-15: qty 100

## 2021-10-15 MED ORDER — FENTANYL CITRATE (PF) 100 MCG/2ML IJ SOLN
INTRAMUSCULAR | Status: DC | PRN
Start: 2021-10-15 — End: 2021-10-15
  Administered 2021-10-15: 50 ug via INTRAVENOUS
  Administered 2021-10-15: 100 ug via INTRAVENOUS
  Administered 2021-10-15: 50 ug via INTRAVENOUS

## 2021-10-15 MED ORDER — OXYCODONE HCL 5 MG PO TABS: 5.0000 mg | ORAL_TABLET | Freq: Once | ORAL | Status: DC | PRN

## 2021-10-15 MED ORDER — MICROFIBRILLAR COLL HEMOSTAT EX PADS
MEDICATED_PAD | CUTANEOUS | Status: DC | PRN
  Administered 2021-10-15: 1 via TOPICAL

## 2021-10-15 MED ORDER — PROPOFOL 10 MG/ML IV BOLUS
INTRAVENOUS | Status: AC
  Filled 2021-10-15: qty 20

## 2021-10-15 MED ORDER — FENTANYL CITRATE (PF) 250 MCG/5ML IJ SOLN
INTRAMUSCULAR | Status: AC
  Filled 2021-10-15: qty 5

## 2021-10-15 MED ORDER — ROCURONIUM BROMIDE 10 MG/ML (PF) SYRINGE
PREFILLED_SYRINGE | INTRAVENOUS | Status: AC
  Filled 2021-10-15: qty 20

## 2021-10-15 MED ORDER — CHLORHEXIDINE GLUCONATE 0.12 % MT SOLN
15.0000 mL | Freq: Once | OROMUCOSAL | Status: AC
  Administered 2021-10-15: 15 mL via OROMUCOSAL

## 2021-10-15 MED ORDER — ROCURONIUM BROMIDE 10 MG/ML (PF) SYRINGE
PREFILLED_SYRINGE | INTRAVENOUS | Status: AC
  Filled 2021-10-15: qty 10

## 2021-10-15 MED ORDER — SUCCINYLCHOLINE CHLORIDE 200 MG/10ML IV SOSY
PREFILLED_SYRINGE | INTRAVENOUS | Status: AC
  Filled 2021-10-15: qty 10

## 2021-10-15 MED ORDER — BUPIVACAINE HCL (PF) 0.25 % IJ SOLN
INTRAMUSCULAR | Status: DC | PRN
  Administered 2021-10-15: 30 mL

## 2021-10-15 MED ORDER — HYDROMORPHONE HCL 1 MG/ML IJ SOLN
0.5000 mg | INTRAMUSCULAR | Status: DC | PRN
  Administered 2021-10-15: 1 mg via INTRAVENOUS
  Filled 2021-10-15: qty 1

## 2021-10-15 MED ORDER — BUPIVACAINE LIPOSOME 1.3 % IJ SUSP
INTRAMUSCULAR | Status: AC
  Filled 2021-10-15: qty 20

## 2021-10-15 MED ORDER — LACTATED RINGERS IV SOLN: INTRAVENOUS | Status: DC | PRN

## 2021-10-15 MED ORDER — BUPIVACAINE HCL 0.25 % IJ SOLN
INTRAMUSCULAR | Status: AC
  Filled 2021-10-15: qty 1

## 2021-10-15 MED ORDER — DEXAMETHASONE SODIUM PHOSPHATE 10 MG/ML IJ SOLN
INTRAMUSCULAR | Status: DC | PRN
  Administered 2021-10-15: 8 mg via INTRAVENOUS

## 2021-10-15 MED ORDER — HYDROMORPHONE HCL 2 MG/ML IJ SOLN
INTRAMUSCULAR | Status: AC
  Filled 2021-10-15: qty 1

## 2021-10-15 MED ORDER — PHENYLEPHRINE HCL (PRESSORS) 10 MG/ML IV SOLN
INTRAVENOUS | Status: AC
  Filled 2021-10-15: qty 1

## 2021-10-15 MED ORDER — GLYCOPYRROLATE 0.2 MG/ML IJ SOLN
INTRAMUSCULAR | Status: AC
  Filled 2021-10-15: qty 1

## 2021-10-15 MED ORDER — PROPOFOL 10 MG/ML IV BOLUS
INTRAVENOUS | Status: DC | PRN
  Administered 2021-10-15: 130 mg via INTRAVENOUS

## 2021-10-15 MED ORDER — DOCUSATE SODIUM 100 MG PO CAPS
100.0000 mg | ORAL_CAPSULE | Freq: Two times a day (BID) | ORAL | Status: DC
  Administered 2021-10-15 – 2021-10-17 (×4): 100 mg via ORAL
  Filled 2021-10-15 (×4): qty 1

## 2021-10-15 MED ORDER — KETAMINE HCL 10 MG/ML IJ SOLN
INTRAMUSCULAR | Status: DC | PRN
  Administered 2021-10-15: 30 mg via INTRAVENOUS

## 2021-10-15 MED ORDER — LIDOCAINE 2% (20 MG/ML) 5 ML SYRINGE
INTRAMUSCULAR | Status: DC | PRN
  Administered 2021-10-15: 70 mg via INTRAVENOUS

## 2021-10-15 MED ORDER — ALBUMIN HUMAN 5 % IV SOLN: INTRAVENOUS | Status: DC | PRN

## 2021-10-15 MED ORDER — KETAMINE HCL 10 MG/ML IJ SOLN
INTRAMUSCULAR | Status: AC
  Filled 2021-10-15: qty 1

## 2021-10-15 MED ORDER — POLYETHYLENE GLYCOL 3350 17 G PO PACK
17.0000 g | PACK | Freq: Every day | ORAL | Status: DC
Start: 2021-10-15 — End: 2021-10-15

## 2021-10-15 MED ORDER — ONDANSETRON HCL 4 MG/2ML IJ SOLN
INTRAMUSCULAR | Status: AC
  Filled 2021-10-15: qty 2

## 2021-10-15 MED ORDER — HYDROMORPHONE HCL 1 MG/ML IJ SOLN
INTRAMUSCULAR | Status: AC
  Filled 2021-10-15: qty 2

## 2021-10-15 MED ORDER — EPHEDRINE SULFATE-NACL 50-0.9 MG/10ML-% IV SOSY
PREFILLED_SYRINGE | INTRAVENOUS | Status: DC | PRN
  Administered 2021-10-15: 7.5 mg via INTRAVENOUS

## 2021-10-15 MED ORDER — ONDANSETRON HCL 4 MG/2ML IJ SOLN
4.0000 mg | Freq: Once | INTRAMUSCULAR | Status: DC | PRN
Start: 2021-10-15 — End: 2021-10-15

## 2021-10-15 MED ORDER — LACTATED RINGERS IV SOLN
INTRAVENOUS | Status: DC
  Administered 2021-10-15: 1000 mL via INTRAVENOUS

## 2021-10-15 MED ORDER — PROPOFOL 10 MG/ML IV BOLUS
INTRAVENOUS | Status: AC
Start: 2021-10-15 — End: ?
  Filled 2021-10-15: qty 20

## 2021-10-15 MED ORDER — SODIUM CHLORIDE 0.9 % IV SOLN: INTRAVENOUS | Status: DC

## 2021-10-15 MED ORDER — ROCURONIUM BROMIDE 10 MG/ML (PF) SYRINGE
PREFILLED_SYRINGE | INTRAVENOUS | Status: DC | PRN
  Administered 2021-10-15: 100 mg via INTRAVENOUS
  Administered 2021-10-15: 20 mg via INTRAVENOUS
  Administered 2021-10-15: 30 mg via INTRAVENOUS

## 2021-10-15 MED ORDER — SODIUM CHLORIDE (PF) 0.9 % IJ SOLN
INTRAMUSCULAR | Status: DC | PRN
  Administered 2021-10-15: 10 mL

## 2021-10-15 MED ORDER — FERROUS SULFATE 325 (65 FE) MG PO TABS
325.0000 mg | ORAL_TABLET | ORAL | Status: DC
  Administered 2021-10-16: 325 mg via ORAL
  Filled 2021-10-15: qty 1

## 2021-10-15 MED ORDER — DEXMEDETOMIDINE HCL IN NACL 200 MCG/50ML IV SOLN
INTRAVENOUS | Status: AC
  Filled 2021-10-15: qty 50

## 2021-10-15 MED ORDER — LIDOCAINE HCL (PF) 2 % IJ SOLN
INTRAMUSCULAR | Status: AC
  Filled 2021-10-15: qty 25

## 2021-10-15 MED ORDER — ONDANSETRON HCL 4 MG/2ML IJ SOLN: 4.0000 mg | INTRAMUSCULAR | Status: DC | PRN

## 2021-10-15 MED ORDER — ACETAMINOPHEN 325 MG PO TABS: 325.0000 mg | ORAL_TABLET | ORAL | Status: DC | PRN

## 2021-10-15 MED ORDER — EPHEDRINE 5 MG/ML INJ
INTRAVENOUS | Status: AC
  Filled 2021-10-15: qty 5

## 2021-10-15 MED ORDER — OXYCODONE HCL 5 MG/5ML PO SOLN: 5.0000 mg | Freq: Once | ORAL | Status: DC | PRN

## 2021-10-15 MED ORDER — ACETAMINOPHEN 10 MG/ML IV SOLN
1000.0000 mg | Freq: Four times a day (QID) | INTRAVENOUS | Status: AC
  Administered 2021-10-15 – 2021-10-16 (×4): 1000 mg via INTRAVENOUS
  Filled 2021-10-15 (×4): qty 100

## 2021-10-15 MED ORDER — PHENYLEPHRINE HCL-NACL 20-0.9 MG/250ML-% IV SOLN
INTRAVENOUS | Status: DC | PRN
  Administered 2021-10-15: 50 ug/min via INTRAVENOUS

## 2021-10-15 MED ORDER — HYOSCYAMINE SULFATE 0.125 MG SL SUBL: 0.1250 mg | SUBLINGUAL_TABLET | SUBLINGUAL | Status: DC | PRN

## 2021-10-15 MED ORDER — SODIUM CHLORIDE (PF) 0.9 % IJ SOLN
INTRAMUSCULAR | Status: AC
  Filled 2021-10-15: qty 10

## 2021-10-15 MED ORDER — ORAL CARE MOUTH RINSE: 15.0000 mL | Freq: Once | OROMUCOSAL | Status: AC

## 2021-10-15 MED ORDER — ACETAMINOPHEN 160 MG/5ML PO SOLN: 325.0000 mg | ORAL | Status: DC | PRN

## 2021-10-15 MED ORDER — MEPERIDINE HCL 50 MG/ML IJ SOLN: 6.2500 mg | INTRAMUSCULAR | Status: DC | PRN

## 2021-10-15 MED ORDER — CELECOXIB 200 MG PO CAPS
200.0000 mg | ORAL_CAPSULE | Freq: Once | ORAL | Status: AC
  Administered 2021-10-15: 200 mg via ORAL
  Filled 2021-10-15: qty 1

## 2021-10-15 MED ORDER — HYDROMORPHONE HCL 1 MG/ML IJ SOLN
INTRAMUSCULAR | Status: DC | PRN
  Administered 2021-10-15: .5 mg via INTRAVENOUS

## 2021-10-15 MED ORDER — DEXMEDETOMIDINE HCL IN NACL 200 MCG/50ML IV SOLN
0.4000 ug/kg/h | INTRAVENOUS | Status: DC
  Administered 2021-10-15: .3 ug/kg/h via INTRAVENOUS

## 2021-10-15 MED ORDER — HYDROMORPHONE HCL 1 MG/ML IJ SOLN: 0.2500 mg | INTRAMUSCULAR | Status: DC | PRN

## 2021-10-15 MED ORDER — DIPHENHYDRAMINE HCL 12.5 MG/5ML PO ELIX: 12.5000 mg | ORAL_SOLUTION | Freq: Four times a day (QID) | ORAL | Status: DC | PRN

## 2021-10-15 MED ORDER — OXYCODONE-ACETAMINOPHEN 5-325 MG PO TABS
1.0000 | ORAL_TABLET | ORAL | 0 refills | Status: DC | PRN
  Filled 2021-10-15: qty 30, 5d supply, fill #0

## 2021-10-15 MED ORDER — ACETAMINOPHEN 500 MG PO TABS
1000.0000 mg | ORAL_TABLET | Freq: Once | ORAL | Status: AC
  Administered 2021-10-15: 1000 mg via ORAL
  Filled 2021-10-15: qty 2

## 2021-10-15 MED ORDER — IRBESARTAN 150 MG PO TABS
150.0000 mg | ORAL_TABLET | Freq: Every day | ORAL | Status: DC
  Administered 2021-10-15 – 2021-10-17 (×3): 150 mg via ORAL
  Filled 2021-10-15 (×3): qty 1

## 2021-10-15 MED ORDER — BUPIVACAINE LIPOSOME 1.3 % IJ SUSP
INTRAMUSCULAR | Status: DC | PRN
  Administered 2021-10-15: 20 mL

## 2021-10-15 MED ORDER — ARTIFICIAL TEARS OPHTHALMIC OINT
TOPICAL_OINTMENT | OPHTHALMIC | Status: AC
  Filled 2021-10-15: qty 3.5

## 2021-10-15 SURGICAL SUPPLY — 62 items
APL PRP STRL LF DISP 70% ISPRP (MISCELLANEOUS) ×1
ATTRACTOMAT 16X20 MAGNETIC DRP (DRAPES) ×2 IMPLANT
BAG COUNTER SPONGE SURGICOUNT (BAG) IMPLANT
BLADE EXTENDED COATED 6.5IN (ELECTRODE) ×2 IMPLANT
BLADE HEX COATED 2.75 (ELECTRODE) ×2 IMPLANT
CATH FOLEY 2WAY SLVR  5CC 16FR (CATHETERS) ×2
CATH FOLEY 2WAY SLVR 5CC 16FR (CATHETERS) ×1 IMPLANT
CHLORAPREP W/TINT 26 (MISCELLANEOUS) ×2 IMPLANT
CLIP LIGATING HEM O LOK PURPLE (MISCELLANEOUS) ×8 IMPLANT
CLIP LIGATING HEMO O LOK GREEN (MISCELLANEOUS) ×8 IMPLANT
COVER BACK TABLE 60X90IN (DRAPES) ×2 IMPLANT
CUTTER ECHEON FLEX ENDO 45 340 (ENDOMECHANICALS) ×1 IMPLANT
DISSECTOR ROUND CHERRY 3/8 STR (MISCELLANEOUS) ×2 IMPLANT
DRAIN CHANNEL 15F RND FF 3/16 (WOUND CARE) ×2 IMPLANT
DRAIN PENROSE 0.5X18 (DRAIN) IMPLANT
DRAPE LAPAROSCOPIC ABDOMINAL (DRAPES) IMPLANT
DRAPE LAPAROTOMY TRNSV 102X78 (DRAPES) IMPLANT
DRAPE UTILITY XL STRL (DRAPES) ×2 IMPLANT
DRAPE WARM FLUID 44X44 (DRAPES) ×2 IMPLANT
DRSG TELFA 3X8 NADH (GAUZE/BANDAGES/DRESSINGS) ×2 IMPLANT
ELECT REM PT RETURN 15FT ADLT (MISCELLANEOUS) ×2 IMPLANT
EVACUATOR SILICONE 100CC (DRAIN) ×2 IMPLANT
GAUZE 4X4 16PLY ~~LOC~~+RFID DBL (SPONGE) ×2 IMPLANT
GAUZE SPONGE 4X4 12PLY STRL (GAUZE/BANDAGES/DRESSINGS) ×3 IMPLANT
GLOVE SURG LX 7.5 STRW (GLOVE) ×1
GLOVE SURG LX STRL 7.5 STRW (GLOVE) ×1 IMPLANT
GOWN STRL REUS W/ TWL XL LVL3 (GOWN DISPOSABLE) ×1 IMPLANT
GOWN STRL REUS W/TWL XL LVL3 (GOWN DISPOSABLE) ×2
HEMOSTAT SURGICEL 4X8 (HEMOSTASIS) ×1 IMPLANT
KIT BASIN OR (CUSTOM PROCEDURE TRAY) ×2 IMPLANT
KIT TURNOVER KIT A (KITS) IMPLANT
LOOP VESSEL MAXI BLUE (MISCELLANEOUS) ×2 IMPLANT
NS IRRIG 1000ML POUR BTL (IV SOLUTION) ×2 IMPLANT
PACK GENERAL/GYN (CUSTOM PROCEDURE TRAY) ×2 IMPLANT
PAD DRESSING TELFA 3X8 NADH (GAUZE/BANDAGES/DRESSINGS) IMPLANT
PROTECTOR NERVE ULNAR (MISCELLANEOUS) ×4 IMPLANT
RELOAD STAPLE 45 2.6 WHT THIN (STAPLE) IMPLANT
SPIKE FLUID TRANSFER (MISCELLANEOUS) IMPLANT
SPONGE SURGIFOAM ABS GEL 100 (HEMOSTASIS) IMPLANT
STAPLE RELOAD 45 WHT (STAPLE) ×3 IMPLANT
STAPLE RELOAD 45MM WHITE (STAPLE) ×6
STAPLER VISISTAT 35W (STAPLE) ×4 IMPLANT
SURGIFLO W/THROMBIN 8M KIT (HEMOSTASIS) ×2 IMPLANT
SUT ETHILON 3 0 PS 1 (SUTURE) IMPLANT
SUT MON AB 2-0 SH 27 (SUTURE) ×4
SUT MON AB 2-0 SH27 (SUTURE) ×2 IMPLANT
SUT PDS AB 1 TP1 96 (SUTURE) ×4 IMPLANT
SUT SILK 0 (SUTURE) ×2
SUT SILK 0 30XBRD TIE 6 (SUTURE) ×1 IMPLANT
SUT SILK 2 0 (SUTURE) ×2
SUT SILK 2-0 30XBRD TIE 12 (SUTURE) ×1 IMPLANT
SUT VIC AB 2-0 SH 27 (SUTURE) ×8
SUT VIC AB 2-0 SH 27X BRD (SUTURE) ×4 IMPLANT
SUT VIC AB 4-0 RB1 27 (SUTURE) ×8
SUT VIC AB 4-0 RB1 27XBRD (SUTURE) ×4 IMPLANT
TAPE PAPER 3X10 WHT MICROPORE (GAUZE/BANDAGES/DRESSINGS) ×1 IMPLANT
TAPE UMBILICAL 1/8 X36 TWILL (MISCELLANEOUS) ×2 IMPLANT
TOWEL OR 17X26 10 PK STRL BLUE (TOWEL DISPOSABLE) ×2 IMPLANT
TOWEL OR NON WOVEN STRL DISP B (DISPOSABLE) ×2 IMPLANT
TUBING CONNECTING 10 (TUBING) ×2 IMPLANT
URINEMETER 350 (MISCELLANEOUS) ×2 IMPLANT
WATER STERILE IRR 1000ML POUR (IV SOLUTION) ×2 IMPLANT

## 2021-10-15 NOTE — Discharge Instructions (Signed)

## 2021-10-15 NOTE — Brief Op Note (Addendum)
Operative Note  Preoperative diagnosis:  1.  Right renal mass  Postoperative diagnosis: 1.  Right renal mass  Procedure(s): 1.  Open right radical nephrectomy (adrenal sparing) 2. Incisional block for post-operative pain relief  Surgeon: Rexene Alberts, MD  Assistants:  Debbrah Alar, PA  An assistant was required for this surgical procedure.  The duties of the assistant included but were not limited to suctioning, passing suture, retraction.  This procedure would not be able to be performed without an Environmental consultant.   Anesthesia:  General  Complications:  None  EBL:  420m  Specimens: 1.  ID Type Source Tests Collected by Time Destination  1 : right kidney Tissue PATH Other SURGICAL PATHOLOGY Micheal Lima MD 10/15/2021 0935    Drains/Catheters: 1.  16 Fr foley catheter  Intraoperative findings:   Very large right anterior renal mass. Branching renal vein and branching renal artery. No palpable renal vein thrombus. Hilum stapled en bloc with excellent hemostasis.  Indication:  Micheal HASTINGis a 81y.o. male with a large complex right renal mass measuring up to 13.5cm. He is here for open radical right nephrectomy.  Description of procedure: After satisfactory induction of general endotracheal anesthesia, the patient was placed in the supine position, and prepped and draped in normal sterile fashion for open right renal surgery. A bump was placed on his right side.  A preoperative time-out was performed.  The correct patient, site, and procedure were identified as right side.  The patient received preoperative antibiotics within 30 minutes of start of the case.  SCDs were on and functioning throughout the case.  All pressure points were padded and a bump was placed underneath the patient's right side.  The table had a slight flexion of the level of the ASIS.  All fire hazards were identified.  First incision was marked out starting at the xiphoid process two fingerbreadths below this  to the tip of the right twelfth rib 2 cm inferior to the costal margin. We extended this incision contralaterally to the left side as a chevron incision. This was incised with a #10 blade.  Bovie electrocautery was used to carry the dissection down through the muscle and fascial layers until the peritoneum was encountered.  This was then opened sharply with Metzenbaum scissors and the entirety of the peritoneum was opened along the length of the incision.  Inspection of the abdomen was then performed.  It was noted that there was a large easily palpable mass in the anterior right kidney. There were no masses palpated within the liver. The NG tube was palpated to be within the fundus of the stomach.  We then proceeded with right open radical nephrectomy.  Next, the white line of Toldt was incised from the hepatic flexure to the iliac vessels within the pelvis using a right angle clamp and Bovie electrocautery.  At this time, we did note that there was an intense inflammatory reaction with parasitic vessels on the anterior surface of the mass.    We then carried the dissection down around the lower pole of the kidney, dividing line between the colonic mesenteric Gerota's fascia to mobilize the large bowel. Gerotas fascia remained intact on the kidney and this was eventually removed with the specimen. We kocherized the duodenum which was plastered against the anterior portion of the renal mass. This was taken sharply without use of cautery. There were several small parasitic veins and these were ligated with silk ties as well as clips.  The  ureter was dissected out the right angle clamp, and then ligated with clips and divided.  Dissection was then performed around the lateral portion of the kidney and the kidney was mobilized off the peritoneum.   We then dissected out the renal hilum using a right angle clamp.  A single branching renal artery and a single branching large renal vein as well as one or two small  accessory vessels were encountered.  Of note, we identified the vein easily, however required more kidney mobilization in order to identify the artery. We took the hilum in block with a battery operated stapler. We used several vascular loads in order to take the entire hilum. The renal vein was flat and no tumor thrombus was palpated.  Now, the kidney was completely mobilized except by the upper pole.  We continued the dissection around the superior to the adrenal gland and we were able to obtain a plane between the adrenal gland and upper pole of the kidney. Once the kidney had been dissected out, this was removed and passed off for specimen. We irrigated with 2L normal saline. Excellent hemostasis was obtained. The fascia was infiltrated with exparel for an incisional block.   The posterior portion of the rectus sheath was then closed with a running #1 PDS suture.  The posterior portion of transversalis fascia and internal oblique fascia and muscles were then closed with a running 0 Vicryl suture.  The anterior layer was then closed with two separate running #1 PDS sutures also incorporating the anterior rectus sheath.  3-0 Vicryl deep dermal sutures were then placed to reapproximate the subcutaneous tissue.  The skin was reapproximated with staples.  A dressing with 4x4s, Telfa, and tape was then applied.  The patient was then cleaned off, awoken from anesthesia, and returned to PACU in stable condition, having tolerated the procedure well.  [name] [name]  Pager: [phone]

## 2021-10-15 NOTE — Transfer of Care (Signed)
Immediate Anesthesia Transfer of Care Note  Patient: Micheal Mcintyre  Procedure(s) Performed: NEPHRECTOMY, OPEN (Right: Abdomen)  Patient Location: PACU  Anesthesia Type:General  Level of Consciousness: drowsy  Airway & Oxygen Therapy: Patient Spontanous Breathing and Patient connected to face mask oxygen  Post-op Assessment: Report given to RN and Post -op Vital signs reviewed and stable  Post vital signs: Reviewed and stable  Last Vitals:  Vitals Value Taken Time  BP 98/45 10/15/21 1256  Temp 36.4 C 10/15/21 1256  Pulse 61 10/15/21 1300  Resp 11 10/15/21 1300  SpO2 100 % 10/15/21 1300  Vitals shown include unvalidated device data.  Last Pain:  Vitals:   10/15/21 1256  TempSrc:   PainSc: Asleep      Patients Stated Pain Goal: 3 (47/42/59 [id number])  Complications: No notable events documented.

## 2021-10-15 NOTE — Anesthesia Procedure Notes (Signed)
Procedure Name: Intubation Date/Time: 10/15/2021 8:46 AM  Performed by: Cleda Daub, CRNAPre-anesthesia Checklist: Patient identified, Emergency Drugs available, Suction available and Patient being monitored Patient Re-evaluated:Patient Re-evaluated prior to induction Oxygen Delivery Method: Circle system utilized Preoxygenation: Pre-oxygenation with 100% oxygen Induction Type: IV induction Ventilation: Mask ventilation without difficulty Laryngoscope Size: Mac and 3 Grade View: Grade I Tube type: Oral Tube size: 7.5 mm Number of attempts: 1 Airway Equipment and Method: Stylet and Oral airway Placement Confirmation: ETT inserted through vocal cords under direct vision, positive ETCO2 and breath sounds checked- equal and bilateral Secured at: 23 cm Tube secured with: Tape Dental Injury: Teeth and Oropharynx as per pre-operative assessment

## 2021-10-15 NOTE — H&P (Signed)
Office Visit Report     10/07/2021    CC/HPI: Micheal Mcintyre is a 81 year old male seen today for cystoscopy for evaluation of gross hematuria. He also has a recent diagnosis of large complex right renal mass for which a right nephrectomy scheduled.   #1. Large complex right renal mass:  -He has a recent history of back pain for which he underwent an MRI partially demonstrating a large right upper abdominal mass. He was due to undergo further evaluation however presented to ED on 09/01/2021 with complaints of gross hematuria.  -CT A/P 09/01/2021 demonstrated large complex right renal mass most compatible with renal cell carcinoma measuring 11.2 x 13.5 cm. He is also found to have a 4 mm posterior medial right lower lobe nodule.  -He denies abdominal pain or flank pain. He did have some gross hematuria that has now resolved.   #2Johney Mcintyre hematuria: He denies gross hematuria earlier this week that has resolved. He denies a smoking history. He denies a family history of urologic malignancy. CT recently with large right renal mass as above. Cystoscopy 09/2021 with moderately obstructing prostate with some hypervascularity of the prostate. No suspicious bladder lesions.   He does have a past medical history of hypertension. He denies prior abdominal surgeries. He denies anticoagulation.   10/07/2021: Today he presents for preoperative appointment prior to undergoing a open right nephrectomy. He is ready to proceed with surgery and feels he understands all of the preoperative directions. He has had no changes to his medications, health history or allergy list. He denies chest pain and shortness of breath.     ALLERGIES: No Known Drug Allergies    MEDICATIONS: TELMISARTAN PO Daily  Tylenol     GU PSH: Cystoscopy - 09/08/2021     NON-GU PSH: Hip Arthroscopy/surgery Hip Replacement - about 2005     GU PMH: Gross hematuria - 09/08/2021, - 09/02/2021 Right renal neoplasm - 09/08/2021, - 09/02/2021    NON-GU PMH:  Arthritis GERD Hypertension    FAMILY HISTORY: 1 Daughter - Runs in Family 1 son - Runs in Family Kidney Stones - Runs in Family   SOCIAL HISTORY: Marital Status: Married Ethnicity: Not Hispanic Or Latino; Race: White Current Smoking Status: Patient has never smoked.  Drinks 2 drinks per week. Social Drinker.  Drinks 3 caffeinated drinks per day. Has not had a blood transfusion. Patient's occupation [employer].    REVIEW OF SYSTEMS:    GU Review Male:   Patient denies frequent urination, hard to postpone urination, burning/ pain with urination, get up at night to urinate, leakage of urine, stream starts and stops, trouble starting your stream, have to strain to urinate , erection problems, and penile pain.  Gastrointestinal (Upper):   Patient denies nausea, vomiting, and indigestion/ heartburn.  Gastrointestinal (Lower):   Patient denies diarrhea and constipation.  Constitutional:   Patient denies fever, night sweats, weight loss, and fatigue.  Skin:   Patient denies skin rash/ lesion and itching.  Eyes:   Patient denies blurred vision and double vision.  Ears/ Nose/ Throat:   Patient denies sore throat and sinus problems.  Hematologic/Lymphatic:   Patient denies swollen glands and easy bruising.  Cardiovascular:   Patient denies leg swelling and chest pains.  Respiratory:   Patient denies cough and shortness of breath.  Endocrine:   Patient denies excessive thirst.  Musculoskeletal:   Patient denies back pain and joint pain.  Neurological:   Patient denies headaches and dizziness.  Psychologic:   Patient denies depression and anxiety.   VITAL SIGNS:      10/07/2021 11:45 AM  BP 141/71 mmHg  Heart Rate 80 /min  Temperature 97.1 F / 36.1 C   MULTI-SYSTEM PHYSICAL EXAMINATION:    Constitutional: Well-nourished. No physical deformities. Normally developed. Good grooming.  Respiratory: Normal breath sounds. No labored breathing, no  use of accessory muscles.   Cardiovascular: Regular rate and rhythm. No murmur, no gallop. Normal temperature, normal extremity pulses, no swelling, no varicosities.   Skin: No paleness, no jaundice, no cyanosis. No lesion, no ulcer, no rash.  Neurologic / Psychiatric: Oriented to time, oriented to place, oriented to person. No depression, no anxiety, no agitation.  Gastrointestinal: No mass, no tenderness, no rigidity, non obese abdomen.  Eyes: Normal conjunctivae. Normal eyelids.  Musculoskeletal: Normal gait and station of head and neck.     Complexity of Data:  Source Of History:  Patient  Records Review:   Previous Doctor Records, Previous Patient Records  Urine Test Review:   Urinalysis   10/07/21  Urinalysis  Urine Appearance Slightly Cloudy   Urine Color Yellow   Urine Glucose Neg mg/dL  Urine Bilirubin Neg mg/dL  Urine Ketones Neg mg/dL  Urine Specific Gravity 1.015   Urine Blood 3+ ery/uL  Urine pH <=5.0   Urine Protein 1+ mg/dL  Urine Urobilinogen 0.2 mg/dL  Urine Nitrites Neg   Urine Leukocyte Esterase Neg leu/uL  Urine WBC/hpf 0 - 5/hpf   Urine RBC/hpf 20 - 40/hpf   Urine Epithelial Cells NS (Not Seen)   Urine Bacteria Mod (26-50/hpf)   Urine Mucous Not Present   Urine Yeast NS (Not Seen)   Urine Trichomonas Not Present   Urine Cystals NS (Not Seen)   Urine Casts NS (Not Seen)   Urine Sperm Not Present    PROCEDURES:          Urinalysis w/Scope Dipstick Dipstick Cont'd Micro  Color: Yellow Bilirubin: Neg mg/dL WBC/hpf: 0 - 5/hpf  Appearance: Slightly Cloudy Ketones: Neg mg/dL RBC/hpf: 20 - 40/hpf  Specific Gravity: 1.015 Blood: 3+ ery/uL Bacteria: Mod (26-50/hpf)  pH: <=5.0 Protein: 1+ mg/dL Cystals: NS (Not Seen)  Glucose: Neg mg/dL Urobilinogen: 0.2 mg/dL Casts: NS (Not Seen)    Nitrites: Neg Trichomonas: Not Present    Leukocyte Esterase: Neg leu/uL Mucous: Not Present      Epithelial Cells: NS (Not Seen)      Yeast: NS (Not Seen)      Sperm: Not  Present    ASSESSMENT:      ICD-10 Details  1 GU:   Right renal neoplasm - D49.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            Orders Labs CULTURE, URINE          Document Letter(s):  Created for Patient: Clinical Summary         Notes:   All questions answered to the best of my ability. He will keep his upcoming surgery as scheduled and his urine will be sent for culture. He understands to notify the clinic with any changes to his medications or allergies or health history.         Next Appointment:      Next Appointment: 10/15/2021 08:30 AM    Appointment Type: Surgery     Location: [facility] [phone]    Provider: Mcarthur Rossetti, PA    Reason for Visit: WL/EXT REC RT OPEN NEPHRECTOMY WITH DR Abner Greenspan    Urology Preoperative H&P   Chief Complaint: Right renal mass  History of Present Illness: Micheal Mcintyre is a 81 y.o. male with a large right renal mass here for right open radical nephrectomy. Denies fevers, chills, dysuria. Received medical clearance.  Past Medical History:  Diagnosis Date   Arthritis    GERD (gastroesophageal reflux disease)    Hypertension    Medicare annual wellness visit, subsequent 08/19/2014   Osteoarthritis 09/21/2007   Qualifier: Diagnosis of  By: Niel Hummer MD, Lorinda Creed    PONV (postoperative nausea and vomiting)    Preventative health care 06/07/2016   Right shoulder pain 12/03/2016    Past Surgical History:  Procedure Laterality Date   CHOLECYSTECTOMY     HIP SURGERY     JOINT REPLACEMENT     nose skin cancer removal     TOTAL HIP ARTHROPLASTY      Allergies: No Known Allergies  Family History  Problem Relation Age of Onset   Heart attack Father 35       deceased   Diabetes Maternal Uncle        maternal grandfather   Hypertension Neg Hx    Breast cancer Neg Hx    Colon cancer Neg Hx     Prostate cancer Neg Hx     Social History:  reports that he has never smoked. He has never used smokeless tobacco. He reports current alcohol use. He reports that he does not use drugs.  ROS: A complete review of systems was performed.  All systems are negative except for pertinent findings as noted.  Physical Exam:  Vital signs in last 24 hours: Temp:  [98 F (36.7 C)] 98 F (36.7 C) (06/14 0658) Pulse Rate:  [78] 78 (06/14 0658) Resp:  [15] 15 (06/14 0658) BP: (147)/(70) 147/70 (06/14 0658) SpO2:  [97 %] 97 % (06/14 0658) Weight:  [73.5 kg] 73.5 kg (06/14 0707) Constitutional:  Alert and oriented, No acute distress Cardiovascular: Regular rate and rhythm Respiratory: Normal respiratory effort, Lungs clear bilaterally GI: Abdomen is soft, nontender, nondistended, no abdominal masses GU: No CVA tenderness Lymphatic: No lymphadenopathy Neurologic: Grossly intact, no focal deficits Psychiatric: Normal mood and affect  Laboratory Data:  No results for input(s): "WBC", "HGB", "HCT", "PLT" in the last 72 hours.  No results for input(s): "NA", "K", "CL", "GLUCOSE", "BUN", "CALCIUM", "CREATININE" in the last 72 hours.  Invalid input(s): "CO3"   No results found for this or any previous visit (from the past 24 hour(s)). No results found for this or any previous visit (from the past 240 hour(s)).  Renal Function: No results for input(s): "CREATININE" in the last 168 hours. CrCl cannot be calculated (Patient's most recent lab result is older than the maximum 21 days allowed.).  Radiologic Imaging: No results found.  I independently reviewed the above imaging studies.  Assessment and Plan HARCE VOLDEN is a 81 y.o. male with a large right renal mass here for right open radical nephrectomy.    We have discussed the risks of treatment in detail including but not limited to bleeding, infection, tumor spillage, heart attack, stroke, death, venothromoboembolism, cancer recurrence,  injury/damage to surrounding organs and structures, urine leak, the risk of developing chronic kidney disease and its associated implications, and the potential risk of end stage renal disease possibly necessitating  dialysis.    [name] Branson Kranz MD 10/15/2021, 7:17 AM  [facility] Pager: [phone]

## 2021-10-16 ENCOUNTER — Other Ambulatory Visit (HOSPITAL_COMMUNITY): Payer: Self-pay

## 2021-10-16 ENCOUNTER — Encounter (HOSPITAL_COMMUNITY): Payer: Self-pay | Admitting: Urology

## 2021-10-16 LAB — HEMOGLOBIN AND HEMATOCRIT, BLOOD
HCT: 30.2 % — ABNORMAL LOW (ref 39.0–52.0)
Hemoglobin: 9.9 g/dL — ABNORMAL LOW (ref 13.0–17.0)

## 2021-10-16 LAB — BASIC METABOLIC PANEL
Anion gap: 6 (ref 5–15)
BUN: 20 mg/dL (ref 8–23)
CO2: 24 mmol/L (ref 22–32)
Calcium: 7.8 mg/dL — ABNORMAL LOW (ref 8.9–10.3)
Chloride: 108 mmol/L (ref 98–111)
Creatinine, Ser: 1.43 mg/dL — ABNORMAL HIGH (ref 0.61–1.24)
GFR, Estimated: 50 mL/min — ABNORMAL LOW (ref >60)
Glucose, Bld: 184 mg/dL — ABNORMAL HIGH (ref 70–99)
Potassium: 4.3 mmol/L (ref 3.5–5.1)
Sodium: 138 mmol/L (ref 135–145)

## 2021-10-16 MED ORDER — HEPARIN SODIUM (PORCINE) 5000 UNIT/ML IJ SOLN
5000.0000 [IU] | Freq: Three times a day (TID) | INTRAMUSCULAR | Status: DC
  Administered 2021-10-16 – 2021-10-17 (×5): 5000 [IU] via SUBCUTANEOUS
  Filled 2021-10-16 (×5): qty 1

## 2021-10-16 MED ORDER — CHLORHEXIDINE GLUCONATE CLOTH 2 % EX PADS
6.0000 | MEDICATED_PAD | Freq: Every day | CUTANEOUS | Status: DC
  Administered 2021-10-16: 6 via TOPICAL

## 2021-10-16 MED ORDER — SODIUM CHLORIDE 0.9 % IV SOLN: INTRAVENOUS | Status: DC

## 2021-10-16 MED ORDER — TAMSULOSIN HCL 0.4 MG PO CAPS
0.4000 mg | ORAL_CAPSULE | Freq: Every day | ORAL | Status: DC
  Administered 2021-10-16 – 2021-10-17 (×2): 0.4 mg via ORAL
  Filled 2021-10-16 (×2): qty 1

## 2021-10-16 MED ORDER — ACETAMINOPHEN 325 MG PO TABS
650.0000 mg | ORAL_TABLET | Freq: Four times a day (QID) | ORAL | Status: DC
  Administered 2021-10-16 – 2021-10-17 (×4): 650 mg via ORAL
  Filled 2021-10-16 (×4): qty 2

## 2021-10-16 MED ORDER — TAMSULOSIN HCL 0.4 MG PO CAPS
0.4000 mg | ORAL_CAPSULE | Freq: Every day | ORAL | 3 refills | Status: DC
  Filled 2021-10-16: qty 90, 90d supply, fill #0

## 2021-10-16 NOTE — TOC Initial Note (Signed)
Transition of Care [facility]) - Initial/Assessment Note    Patient Details  Name: Micheal Mcintyre MRN: [id number] Date of Birth: Jul 17, 1940  Transition of Care [facility]) CM/SW Contact:    Leeroy Cha, RN Phone Number: 10/16/2021, 8:17 AM  Clinical Narrative:                  Transition of Care [facility]) Screening Note   Patient Details  Name: Micheal Mcintyre Date of Birth: 1940/11/02   Transition of Care [facility]) CM/SW Contact:    Leeroy Cha, RN Phone Number: 10/16/2021, 8:17 AM    Transition of Care Department [facility]) has reviewed patient and no TOC needs have been identified at this time. We will continue to monitor patient advancement through interdisciplinary progression rounds. If new patient transition needs arise, please place a TOC consult.    Expected Discharge Plan: Home/Self Care Barriers to Discharge: Continued Medical Work up   Patient Goals and CMS Choice Patient states their goals for this hospitalization and ongoing recovery are:: to go home CMS Medicare.gov Compare Post Acute Care list provided to:: Patient    Expected Discharge Plan and Services Expected Discharge Plan: Home/Self Care   Discharge Planning Services: CM Consult   Living arrangements for the past 2 months: Single Family Home                                      Prior Living Arrangements/Services Living arrangements for the past 2 months: Single Family Home Lives with:: Spouse Patient language and need for interpreter reviewed:: Yes Do you feel safe going back to the place where you live?: Yes            Criminal Activity/Legal Involvement Pertinent to Current Situation/Hospitalization: No - Comment as needed  Activities of Daily Living      Permission Sought/Granted                  Emotional Assessment Appearance:: Appears stated age     Orientation: : Oriented to Self, Oriented to Place, Oriented to  Time, Oriented to Situation Alcohol / Substance Use: Never  Used Psych Involvement: No (comment)  Admission diagnosis:  Renal mass [N28.89] Patient Active Problem List   Diagnosis Date Noted   Renal mass 10/15/2021   Right upper quadrant abdominal mass 08/26/2021   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   Acquired leg length discrepancy 07/15/2021   Lumbar radiculopathy 07/15/2021   Other idiopathic scoliosis, 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/15/2021   Muscle cramps 07/08/2018   Sun-damaged skin 12/06/2016   Right shoulder pain 12/03/2016   Preventative health care 06/07/2016   Vitamin D deficiency 06/02/2015   Medicare annual wellness visit, subsequent 08/19/2014   Hyperglycemia 05/25/2012   Hypothyroidism 03/14/2009   Anemia 03/14/2009   FREQUENCY, URINARY 03/14/2009   UNS ADVRS EFF OTH RX MEDICINAL&BIOLOGICAL SBSTNC 03/14/2009   ERECTILE DYSFUNCTION 10/19/2007   Hyperlipidemia, mixed 09/21/2007   Osteoarthritis 09/21/2007   Essential hypertension 01/12/2007   GERD 01/12/2007   PCP:  [name], MD Pharmacy:   [id number] - [place], [place] - [address] AT [place] OF [address] [place] [address] Phone: [phone] Fax: [phone]     Social Determinants of Health (SDOH) Interventions    Readmission Risk Interventions     No data to display

## 2021-10-16 NOTE — Progress Notes (Signed)
   10/16/21 1841  Urine Characteristics  Urinary Interventions Bladder scan  Bladder Scan Volume (mL) 678 mL   Dr. Abner Greenspan notified about bladder scan volume results and ordered to place foley. Foley inserted and 625 cc drained from foley catheter initially. See new orders. MD ordered flomax for patient experiencing urinary urgency and feelings of a lack of emptying after urinating. Plan to remove foley tomorrow morning.  Layla Maw, RN

## 2021-10-16 NOTE — Progress Notes (Signed)
1 Day Post-Op Subjective: Pain very well controlled. No nausea or emesis. No flatus or BM.  Objective: Vital signs in last 24 hours: Temp:  [97.4 F (36.3 C)-98.2 F (36.8 C)] 98.2 F (36.8 C) (06/15 0528) Pulse Rate:  [63-81] 72 (06/15 0528) Resp:  [7-20] 18 (06/15 0528) BP: (72-152)/(42-81) 123/69 (06/15 0528) SpO2:  [97 %-100 %] 100 % (06/15 0528)  Intake/Output from previous day: 06/14 0701 - 06/15 0700 In: 5048.7 [P.O.:720; I.V.:3428.7; IV Piggyback:900] Out: 1525 [Urine:1125; Blood:400] Intake/Output this shift: No intake/output data recorded. UOP: 1.1L clear yellow  Physical Exam:  General: Alert and oriented CV: RRR Lungs: Clear Abdomen: Soft, ND, ATTP; inc c/d/i Ext: NT, No erythema  Lab Results: Recent Labs    10/15/21 1343 10/16/21 0406  HGB 10.0* 9.9*  HCT 30.4* 30.2*   BMET Recent Labs    10/16/21 0406  NA 138  K 4.3  CL 108  CO2 24  GLUCOSE 184*  BUN 20  CREATININE 1.43*  CALCIUM 7.8*     Studies/Results: No results found.  Assessment/Plan: Right renal mass s/p right open radical nephrectomy on 10/15/2021  -Pain control prn -Clears, advance to regular diet -Saline lock -Void trial -Reviewed labs, appropriate -OOB, amb, IS, SQH -Keep in house today   LOS: 1 day   [name] Camisha Srey MD 10/16/2021, 8:02 AM [facility]  Pager: [phone]

## 2021-10-16 NOTE — Progress Notes (Signed)
Pt. Ambulated in hall with assistance and walker 450 feet.  Tolerated well.

## 2021-10-16 NOTE — Op Note (Signed)
Operative Note   Preoperative diagnosis:  1.  Right renal mass   Postoperative diagnosis: 1.  Right renal mass   Procedure(s): 1.  Open right radical nephrectomy (adrenal sparing) 2. Incisional block for post-operative pain relief   Surgeon: Rexene Alberts, MD   Assistants:  Debbrah Alar, PA   An assistant was required for this surgical procedure.  The duties of the assistant included but were not limited to suctioning, passing suture, retraction.  This procedure would not be able to be performed without an Environmental consultant.    Anesthesia:  General   Complications:  None   EBL:  460m   Specimens: 1.  ID Type Source Tests Collected by Time Destination 1 : right kidney Tissue PATH Other SURGICAL PATHOLOGY GJanith Lima MD 10/15/2021 0935     Drains/Catheters: 1.  16 Fr foley catheter   Intraoperative findings:   1. Very large right anterior renal mass. Branching renal vein and branching renal artery. No palpable renal vein thrombus. Hilum stapled en bloc with excellent hemostasis.   Indication:  DLAMARION MCEVERSis a 81y.o. male with a large complex right renal mass measuring up to 13.5cm. He is here for open radical right nephrectomy.   Description of procedure: After satisfactory induction of general endotracheal anesthesia, the patient was placed in the supine position, and prepped and draped in normal sterile fashion for open right renal surgery. A bump was placed on his right side.  A preoperative time-out was performed.  The correct patient, site, and procedure were identified as right side.  The patient received preoperative antibiotics within 30 minutes of start of the case.  SCDs were on and functioning throughout the case.  All pressure points were padded and a bump was placed underneath the patient's right side.  The table had a slight flexion of the level of the ASIS.  All fire hazards were identified.   First incision was marked out starting at the xiphoid process two  fingerbreadths below this to the tip of the right twelfth rib 2 cm inferior to the costal margin. We extended this incision contralaterally to the left side as a chevron incision. This was incised with a #10 blade.  Bovie electrocautery was used to carry the dissection down through the muscle and fascial layers until the peritoneum was encountered.  This was then opened sharply with Metzenbaum scissors and the entirety of the peritoneum was opened along the length of the incision.  Inspection of the abdomen was then performed.  It was noted that there was a large easily palpable mass in the anterior right kidney. There were no masses palpated within the liver. The NG tube was palpated to be within the fundus of the stomach.  We then proceeded with right open radical nephrectomy.   Next, the white line of Toldt was incised from the hepatic flexure to the iliac vessels within the pelvis using a right angle clamp and Bovie electrocautery.  At this time, we did note that there was an intense inflammatory reaction with parasitic vessels on the anterior surface of the mass.     We then carried the dissection down around the lower pole of the kidney, dividing line between the colonic mesenteric Gerota's fascia to mobilize the large bowel. Gerotas fascia remained intact on the kidney and this was eventually removed with the specimen. We kocherized the duodenum which was plastered against the anterior portion of the renal mass. This was taken sharply without use of cautery. There were  several small parasitic veins and these were ligated with silk ties as well as clips.  The ureter was dissected out the right angle clamp, and then ligated with clips and divided.  Dissection was then performed around the lateral portion of the kidney and the kidney was mobilized off the peritoneum.    We then dissected out the renal hilum using a right angle clamp.  A single branching renal artery and a single branching large renal vein  as well as one or two small accessory vessels were encountered.  Of note, we identified the vein easily, however required more kidney mobilization in order to identify the artery. We took the hilum in block with a battery operated stapler. We used several vascular loads in order to take the entire hilum. The renal vein was flat and no tumor thrombus was palpated.   Now, the kidney was completely mobilized except by the upper pole.  We continued the dissection around the superior to the adrenal gland and we were able to obtain a plane between the adrenal gland and upper pole of the kidney. Once the kidney had been dissected out, this was removed and passed off for specimen. We irrigated with 2L normal saline. Excellent hemostasis was obtained. The fascia was infiltrated with exparel for an incisional block.   The posterior portion of the rectus sheath was then closed with a running #1 PDS suture.  The posterior portion of transversalis fascia and internal oblique fascia and muscles were then closed with a running 0 Vicryl suture.  The anterior layer was then closed with two separate running #1 PDS sutures also incorporating the anterior rectus sheath.  3-0 Vicryl deep dermal sutures were then placed to reapproximate the subcutaneous tissue.  The skin was reapproximated with staples.  A dressing with 4x4s, Telfa, and tape was then applied.  The patient was then cleaned off, awoken from anesthesia, and returned to PACU in stable condition, having tolerated the procedure well.   [name] [facility]  Pager: [phone]

## 2021-10-16 NOTE — Anesthesia Postprocedure Evaluation (Signed)
Anesthesia Post Note  Patient: Micheal Mcintyre  Procedure(s) Performed: NEPHRECTOMY, OPEN (Right: Abdomen)     Patient location during evaluation: PACU Anesthesia Type: General Level of consciousness: awake and alert Pain management: pain level controlled Vital Signs Assessment: post-procedure vital signs reviewed and stable Respiratory status: spontaneous breathing, nonlabored ventilation, respiratory function stable and patient connected to nasal cannula oxygen Cardiovascular status: blood pressure returned to baseline and stable Postop Assessment: no apparent nausea or vomiting Anesthetic complications: no   No notable events documented.  Last Vitals:  Vitals:   10/16/21 0950 10/16/21 1236  BP:  (!) 126/92  Pulse:  74  Resp:  16  Temp:  36.5 C  SpO2: 97% 97%    Last Pain:  Vitals:   10/16/21 1236  TempSrc: Oral  PainSc:                  Jaspreet Hollings L Frona Yost

## 2021-10-17 ENCOUNTER — Other Ambulatory Visit (HOSPITAL_COMMUNITY): Payer: Self-pay

## 2021-10-17 LAB — CBC WITH DIFFERENTIAL/PLATELET
Abs Immature Granulocytes: 0.04 10*3/uL (ref 0.00–0.07)
Basophils Absolute: 0 10*3/uL (ref 0.0–0.1)
Basophils Relative: 0 %
Eosinophils Absolute: 0 10*3/uL (ref 0.0–0.5)
Eosinophils Relative: 0 %
HCT: 30.2 % — ABNORMAL LOW (ref 39.0–52.0)
Hemoglobin: 9.8 g/dL — ABNORMAL LOW (ref 13.0–17.0)
Immature Granulocytes: 0 %
Lymphocytes Relative: 8 %
Lymphs Abs: 0.7 10*3/uL (ref 0.7–4.0)
MCH: 32.1 pg (ref 26.0–34.0)
MCHC: 32.5 g/dL (ref 30.0–36.0)
MCV: 99 fL (ref 80.0–100.0)
Monocytes Absolute: 0.9 10*3/uL (ref 0.1–1.0)
Monocytes Relative: 10 %
Neutro Abs: 7.4 10*3/uL (ref 1.7–7.7)
Neutrophils Relative %: 82 %
Platelets: 132 10*3/uL — ABNORMAL LOW (ref 150–400)
RBC: 3.05 MIL/uL — ABNORMAL LOW (ref 4.22–5.81)
RDW: 13.1 % (ref 11.5–15.5)
WBC: 9 10*3/uL (ref 4.0–10.5)
nRBC: 0 % (ref 0.0–0.2)

## 2021-10-17 LAB — BASIC METABOLIC PANEL
Anion gap: 7 (ref 5–15)
BUN: 19 mg/dL (ref 8–23)
CO2: 26 mmol/L (ref 22–32)
Calcium: 8.2 mg/dL — ABNORMAL LOW (ref 8.9–10.3)
Chloride: 107 mmol/L (ref 98–111)
Creatinine, Ser: 1.44 mg/dL — ABNORMAL HIGH (ref 0.61–1.24)
GFR, Estimated: 50 mL/min — ABNORMAL LOW (ref >60)
Glucose, Bld: 123 mg/dL — ABNORMAL HIGH (ref 70–99)
Potassium: 4.6 mmol/L (ref 3.5–5.1)
Sodium: 140 mmol/L (ref 135–145)

## 2021-10-17 MED ORDER — DOCUSATE SODIUM 100 MG PO CAPS
100.0000 mg | ORAL_CAPSULE | Freq: Every day | ORAL | 0 refills | Status: DC | PRN
  Filled 2021-10-17: qty 30, 30d supply, fill #0

## 2021-10-17 MED ORDER — CALCIUM CARBONATE ANTACID 500 MG PO CHEW
400.0000 mg | CHEWABLE_TABLET | Freq: Two times a day (BID) | ORAL | Status: DC | PRN
  Administered 2021-10-17: 400 mg via ORAL
  Filled 2021-10-17: qty 2

## 2021-10-17 NOTE — Progress Notes (Signed)
Patient has been read and explained discharge instructions. Patient has no further questions at this time. IV's of the right hand have been removed. Sites are clean, dry and intact.  Layla Maw, RN

## 2021-10-17 NOTE — Care Management Important Message (Signed)
Important Message  Patient Details IM Letter given to the Patient. Name: Micheal Mcintyre MRN: [id number] Date of Birth: [birth year]   Medicare Important Message Given:  Yes     Kerin Salen 10/17/2021, 12:19 PM

## 2021-10-17 NOTE — Progress Notes (Signed)
Seen this PM. Looks great. Pain well controlled. No nausea or emesis. No flatus. Tolerated some regular diet. Still hasn't voided. Hesitant to go this PM, but may change his mind.  -VT ongoing. If fails, replace catheter and plan for VT outpatient. -Pain control prn -Diet as tolerated -OOB, amb, IS, SQH -Discharge home this afternoon or tomorrow  [name] [facility]  Pager: [phone]

## 2021-10-17 NOTE — Progress Notes (Signed)
2 Days Post-Op Subjective: Pain very well controlled. No nausea or emesis. No flatus or BM. Tolerating clears. Failed VT, excellent UOP.  Objective: Vital signs in last 24 hours: Temp:  [97.7 F (36.5 C)-98.2 F (36.8 C)] 98.2 F (36.8 C) (06/16 0501) Pulse Rate:  [73-74] 73 (06/16 0501) Resp:  [16-18] 18 (06/16 0501) BP: (126-139)/(65-92) 139/65 (06/16 0501) SpO2:  [96 %-97 %] 96 % (06/16 0501)  Intake/Output from previous day: 06/15 0701 - 06/16 0700 In: 2291.3 [P.O.:1920; I.V.:371.3] Out: 3360 [Urine:3360] Intake/Output this shift: No intake/output data recorded. UOP: 3.3L  Physical Exam:  General: Alert and oriented CV: RRR Lungs: Clear Abdomen: Soft, ND, ATTP; inc c/d/I Ext: NT, No erythema  Lab Results: Recent Labs    10/15/21 1343 10/16/21 0406 10/17/21 0330  HGB 10.0* 9.9* 9.8*  HCT 30.4* 30.2* 30.2*   BMET Recent Labs    10/16/21 0406 10/17/21 0330  NA 138 140  K 4.3 4.6  CL 108 107  CO2 24 26  GLUCOSE 184* 123*  BUN 20 19  CREATININE 1.43* 1.44*  CALCIUM 7.8* 8.2*     Studies/Results: No results found.  Assessment/Plan: Right renal mass s/p right open radical nephrectomy on 10/15/2021  -Pain control prn -Advance to regular diet -Saline locked -Repeat void trial. Added flomax last night. -Labs appropriate -OOB, amb, IS, SQH -Possible discharge home this afternoon vs tomorrow   LOS: 2 days   [name] Candita Borenstein MD 10/17/2021, 7:39 AM [facility]  Pager: [phone]

## 2021-10-17 NOTE — Progress Notes (Signed)
   10/17/21 1736  Urine Characteristics  Urinary Interventions Bladder scan  Bladder Scan Volume (mL) 261 mL   Paged Dr. Abner Greenspan about bladder scan volume. MD denies need for foley. Patient will d/c home today.  Layla Maw, RN

## 2021-10-18 ENCOUNTER — Other Ambulatory Visit (HOSPITAL_COMMUNITY): Payer: Self-pay

## 2021-10-20 ENCOUNTER — Telehealth: Payer: Self-pay | Admitting: *Deleted

## 2021-10-20 LAB — SURGICAL PATHOLOGY

## 2021-10-20 NOTE — Discharge Summary (Addendum)
Date of admission: 10/15/2021  Date of discharge: 10/20/2021  Admission diagnosis: Right renal mass  Discharge diagnosis: Right renal mass; high grade papillary renal cell carcinoma  History and Physical: For full details, please see admission history and physical. Briefly, Micheal Mcintyre is a 81 y.o. year old patient with a right renal mass who underwent a right open radical nephrectomy on 10/15/2021.   Hospital Course: The patient recovered in the usual expected fashion.  He had his diet advanced slowly.  Initially managed with IV pain control, then transitioned to PO meds when he was tolerating oral intake.  His labs were stable throughout the hospital course.  He was discharged to home on POD#2.  At the time of discharge the patient was tolerating a regular diet, passing flatus, ambulating, had adequate pain control and was agreeable to discharge.  Follow up as scheduled.    Pathology:   SURGICAL PATHOLOGY  CASE: [id number]  PATIENT: Micheal Mcintyre  Surgical Pathology Report      Clinical History: Right renal mass (crm)      FINAL MICROSCOPIC DIAGNOSIS:   A. 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, nuclear grade 4,  size 16.5 cm  Tumor invades perirenal and renal sinus fat (pT3a)  Ureteral, vascular and all margins of resection are negative for tumor   KIDNEY:  Procedure: Radical Nephrectomy  Specimen Laterality: Right  Tumor Location: Mid  Tumor Size: 16.5 cm  Histologic Type: Papillary renal cell carcinoma  Sarcomatoid Features: Negative  Rhabdoid Features: Identified  Histologic Grade (WHO/ISUP grade): G4  Tumor Necrosis: Negative  Tumor Extension: Perirenal and renal sinus fat, but not beyond Gerota's  fascia  Margins: Negative  Regional Lymph Nodes:       No lymph nodes submitted or found: X       Number of Lymph Nodes Involved: NA       Number of Lymph Nodes Examined: NA  Distant Metastasis: NA  Pathologic Stage  Classification (pTNM, AJCC 8th Edition):  pT3a, pN  Pathologic Findings in Nonneoplastic Kidney: Unremarkable  Representative tumor block: A4 and A11  Comment(s): The neoplasm is composed of papillary renal cell carcinoma,  with areas showing rhabdoid and high grade solid epithelioid  differentiation, Immunohistochemistry stains are positive for pax8, ck7,  p504s, ca-ix (focal), negative for high molecular weight cytokeratin,  p63, Gata 3 and WT-1. The morphology and immunostaining pattern support  the diagnosis of high grade papillary renal cell carcinoma.   Diagnosis of high grade papillary renal cell carcinoma is a clinically significant diagnosis.   Laboratory values: No results for input(s): "HGB", "HCT" in the last 72 hours. No results for input(s): "CREATININE" in the last 72 hours.  Disposition: Home  Discharge instruction: The patient was instructed to be ambulatory but told to refrain from heavy lifting, strenuous activity, or driving.   Discharge medications:  Allergies as of 10/17/2021   No Known Allergies      Medication List     TAKE these medications    acetaminophen 500 MG tablet Commonly known as: TYLENOL Take 1,000 mg by mouth every 6 (six) hours as needed for moderate pain.   docusate sodium 100 MG capsule Commonly known as: Colace Take 1 capsule (100 mg total) by mouth daily as needed for up to 30 doses.   ferrous sulfate 325 (65 FE) MG tablet Take 325 mg by mouth 4 (four) times a week.   Gaviscon 95-358 MG/15ML Susp Generic drug: aluminum hydroxide-magnesium carbonate Take 15  mLs by mouth as needed for heartburn or indigestion.   ibuprofen 200 MG tablet Commonly known as: ADVIL Take 200 mg by mouth daily.   multivitamin with minerals Tabs tablet Take 1 tablet by mouth 4 (four) times a week.   Omega-3 350 MG Cpdr Take 350 mg by mouth daily.   oxyCODONE-acetaminophen 5-325 MG tablet Commonly known as: Percocet Take 1 tablet by mouth every 4  (four) hours as needed for up to 30 doses for severe pain.   tamsulosin 0.4 MG Caps capsule Commonly known as: FLOMAX Take 1 capsule (0.4 mg total) by mouth daily after supper.   telmisartan 80 MG tablet Commonly known as: MICARDIS Take 0.5 tablets (40 mg total) by mouth 2 (two) times daily.   TURMERIC PO Take 1 tablet by mouth 3 (three) times a week.   Vitamin D3 10 MCG (400 UNIT) tablet Take 800 Units by mouth daily.   Voltaren 1 % Gel Generic drug: diclofenac Sodium Apply 1 application. topically 4 (four) times daily as needed (pain).        Followup:   Follow-up Information     Janith Lima, MD Follow up on 10/22/2021.   Specialty: Urology Why: at 9:45 Contact information: [place] [address] [address] [facility]  Pager: [phone]

## 2021-10-20 NOTE — Telephone Encounter (Signed)
Transition Care Management Follow-up Telephone Call Date of discharge and from where: 10/17/21 How have you been since you were released from the hospital? "Doing OK" Any questions or concerns? No  Items Reviewed: Did the pt receive and understand the discharge instructions provided? Yes  Medications obtained and verified? Yes  Other? Yes  Any new allergies since your discharge? No  Dietary orders reviewed? Yes Do you have support at home? Yes - wife  Home Care and Equipment/Supplies: Were home health services ordered? no If so, what is the name of the agency? Not applicable  Has the agency set up a time to come to the patient's home? not applicable Were any new equipment or medical supplies ordered?  No What is the name of the medical supply agency? Not applicable Were you able to get the supplies/equipment? not applicable Do you have any questions related to the use of the equipment or supplies? Not applicable  Functional Questionnaire: (I = Independent and D = Dependent) ADLs: I  Bathing/Dressing- I  Meal Prep- I  Eating- I  Maintaining continence- I  Transferring/Ambulation- I  Managing Meds- I  Follow up appointments reviewed:  PCP Hospital f/u appt confirmed? Yes  Scheduled to see Dr Charlett Blake on 03/31/22 @ 9:20 am. [facility] f/u appt confirmed? Yes  Scheduled to see Dr Abner Greenspan on 10/22/21 @ 9:45 am.- patient states he would like to move the appointment to a later time so advised him to call urology office today to reschedule Are transportation arrangements needed? No  If their condition worsens, is the pt aware to call PCP or go to the Emergency Dept.? Yes Was the patient provided with contact information for the PCP's office or ED? Yes Was to pt encouraged to call back with questions or concerns? Yes

## 2021-10-28 ENCOUNTER — Telehealth: Payer: Self-pay | Admitting: Oncology

## 2021-11-18 ENCOUNTER — Inpatient Hospital Stay: Payer: Medicare Other | Attending: Oncology | Admitting: Oncology

## 2021-11-18 ENCOUNTER — Other Ambulatory Visit: Payer: Self-pay

## 2021-11-18 VITALS — BP 138/65 | HR 85 | Temp 97.4°F | Resp 16 | Wt 160.3 lb

## 2021-11-18 DIAGNOSIS — C641 Malignant neoplasm of right kidney, except renal pelvis: Secondary | ICD-10-CM

## 2021-11-18 DIAGNOSIS — C649 Malignant neoplasm of unspecified kidney, except renal pelvis: Secondary | ICD-10-CM | POA: Insufficient documentation

## 2021-11-18 MED ORDER — PROCHLORPERAZINE MALEATE 10 MG PO TABS: 10.0000 mg | ORAL_TABLET | Freq: Four times a day (QID) | ORAL | 0 refills | Status: DC | PRN

## 2021-11-18 NOTE — Progress Notes (Signed)
Reason for the request: Kidney cancer  HPI: I was asked by Dr. Abner Greenspan to evaluate [name] for the evaluation of kidney cancer.  He is a 81 year old man with history of hypertension and arthritis who is developing chronic progressive back pain.  He was evaluated with an MRI a kidney mass.  He subsequently developed abdominal pain and hematuria and underwent a CT scan of the abdomen pelvis on Sep 01, 2021.  The CT scan showed large complex right renal mass compatible with neoplasm.  4 mm right lower lobe nodule was also noted but no other areas of metastasis discovered.  He subsequently underwent open right radical nephrectomy completed on October 15, 2021.  The final pathology showed papillary renal cell carcinoma with rhabdoid features and nuclear grade 4 measuring 16.5 cm indicating T3a disease.  He recovered well postoperatively without any complaints.  He has reported some fatigue and some tiredness and periodic weakness.  He is back pain is still there and related to osteoarthritis.  He does not report any headaches, blurry vision, syncope or seizures. Does not report any fevers, chills or sweats.  Does not report any cough, wheezing or hemoptysis.  Does not report any chest pain, palpitation, orthopnea or leg edema.  Does not report any nausea, vomiting or abdominal pain.  Does not report any constipation or diarrhea.  Does not report any skeletal complaints.    Does not report frequency, urgency or hematuria.  Does not report any skin rashes or lesions. Does not report any heat or cold intolerance.  Does not report any lymphadenopathy or petechiae.  Does not report any anxiety or depression.  Remaining review of systems is negative.     Past Medical History:  Diagnosis Date   Arthritis    GERD (gastroesophageal reflux disease)    Hypertension    Medicare annual wellness visit, subsequent 08/19/2014   Osteoarthritis 09/21/2007   Qualifier: Diagnosis of  By: Niel Hummer MD, Izell [place] R    PONV  (postoperative nausea and vomiting)    Preventative health care 06/07/2016   Right shoulder pain 12/03/2016  :   Past Surgical History:  Procedure Laterality Date   CHOLECYSTECTOMY     HIP SURGERY     JOINT REPLACEMENT     NEPHRECTOMY Right 10/15/2021   Procedure: NEPHRECTOMY, OPEN;  Surgeon: Janith Lima, MD;  Location: [facility];  Service: Urology;  Laterality: Right;   nose skin cancer removal     TOTAL HIP ARTHROPLASTY    :   Current Outpatient Medications:    acetaminophen (TYLENOL) 500 MG tablet, Take 1,000 mg by mouth every 6 (six) hours as needed for moderate pain., Disp: , Rfl:    aluminum hydroxide-magnesium carbonate (GAVISCON) 95-358 MG/15ML SUSP, Take 15 mLs by mouth as needed for heartburn or indigestion., Disp: , Rfl:    Cholecalciferol (VITAMIN D3) 10 MCG (400 UNIT) tablet, Take 800 Units by mouth daily., Disp: , Rfl:    diclofenac Sodium (VOLTAREN) 1 % GEL, Apply 1 application. topically 4 (four) times daily as needed (pain)., Disp: , Rfl:    docusate sodium (COLACE) 100 MG capsule, Take 1 capsule (100 mg total) by mouth daily as needed for up to 30 doses., Disp: 30 capsule, Rfl: 0   ferrous sulfate 325 (65 FE) MG tablet, Take 325 mg by mouth 4 (four) times a week., Disp: , Rfl:    ibuprofen (ADVIL) 200 MG tablet, Take 200 mg by mouth daily., Disp: , Rfl:    Multiple Vitamin (  MULTIVITAMIN WITH MINERALS) TABS tablet, Take 1 tablet by mouth 4 (four) times a week., Disp: , Rfl:    Omega-3 350 MG CPDR, Take 350 mg by mouth daily., Disp: , Rfl:    oxyCODONE-acetaminophen (PERCOCET) 5-325 MG tablet, Take 1 tablet by mouth every 4 (four) hours as needed for up to 30 doses for severe pain., Disp: 30 tablet, Rfl: 0   tamsulosin (FLOMAX) 0.4 MG CAPS capsule, Take 1 capsule (0.4 mg total) by mouth daily after supper., Disp: 90 capsule, Rfl: 3   telmisartan (MICARDIS) 80 MG tablet, Take 0.5 tablets (40 mg total) by mouth 2 (two) times daily., Disp: 180 tablet, Rfl: 1   TURMERIC PO,  Take 1 tablet by mouth 3 (three) times a week., Disp: , Rfl: :  No Known Allergies:   Family History  Problem Relation Age of Onset   Heart attack Father 81       deceased   Diabetes Maternal Uncle        maternal grandfather   Hypertension Neg Hx    Breast cancer Neg Hx    Colon cancer Neg Hx    Prostate cancer Neg Hx   :   Social History   Socioeconomic History   Marital status: Married    Spouse name: Not on file   Number of children: Not on file   Years of education: Not on file   Highest education level: Not on file  Occupational History   Not on file  Tobacco Use   Smoking status: Never   Smokeless tobacco: Never  Vaping Use   Vaping Use: Never used  Substance and Sexual Activity   Alcohol use: Yes    Comment: occ   Drug use: No   Sexual activity: Not Currently  Other Topics Concern   Not on file  Social History Narrative   Not on file   Social Determinants of Health   Financial Resource Strain: Low Risk  (01/08/2021)   Overall Financial Resource Strain (CARDIA)    Difficulty of Paying Living Expenses: Not hard at all  Food Insecurity: No Food Insecurity (01/08/2021)   Hunger Vital Sign    Worried About Running Out of Food in the Last Year: Never true    Ran Out of Food in the Last Year: Never true  Transportation Needs: No Transportation Needs (01/08/2021)   PRAPARE - Hydrologist (Medical): No    Lack of Transportation (Non-Medical): No  Physical Activity: Sufficiently Active (01/08/2021)   Exercise Vital Sign    Days of Exercise per Week: 5 days    Minutes of Exercise per Session: 30 min  Stress: No Stress Concern Present (01/08/2021)   [place]    Feeling of Stress : Not at all  Social Connections: Moderately Integrated (01/08/2021)   Social Connection and Isolation Panel [NHANES]    Frequency of Communication with Friends and Family: More than three times a  week    Frequency of Social Gatherings with Friends and Family: More than three times a week    Attends Religious Services: More than 4 times per year    Active Member of [employer] or Organizations: No    Attends Archivist Meetings: Never    Marital Status: Married  Human resources officer Violence: Not At Risk (01/08/2021)   Humiliation, Afraid, Rape, and Kick questionnaire    Fear of Current or Ex-Partner: No    Emotionally Abused: No  Physically Abused: No    Sexually Abused: No  :  Pertinent items are noted in HPI.  Exam: Blood pressure 138/65, pulse 85, temperature (!) 97.4 F (36.3 C), temperature source Temporal, resp. rate 16, weight 160 lb 4.8 oz (72.7 kg), SpO2 99 %. ECOG 1 General appearance: alert and cooperative appeared without distress. Head: atraumatic without any abnormalities. Eyes: conjunctivae/corneas clear. PERRL.  Sclera anicteric. Throat: lips, mucosa, and tongue normal; without oral thrush or ulcers. Resp: clear to auscultation bilaterally without rhonchi, wheezes or dullness to percussion. Cardio: regular rate and rhythm, S1, S2 normal, no murmur, click, rub or gallop GI: soft, non-tender; bowel sounds normal; no masses,  no organomegaly Skin: Skin color, texture, turgor normal. No rashes or lesions Lymph nodes: Cervical, supraclavicular, and axillary nodes normal. Neurologic: Grossly normal without any motor, sensory or deep tendon reflexes. Musculoskeletal: No joint deformity or effusion.     Assessment and Plan:    47 year old with:  1.  Kidney cancer diagnosed in May 2023.  He was found to have a 16.5 cm papillary renal cell carcinoma with rhabdoid features indicating T3a disease.  The natural course of this disease was reviewed at this time and the role for adjuvant immunotherapy were discussed.  The role for Pembrolizumab in this particular setting was discussed and would be an extrapolation from the clear-cell renal cell neoplasm data.  The  benefit would be unclear in this particular setting but it would be reasonable to try given his high risk features including the large tumor as well as the rhabdoid features.  Complication associated with Pembrolizumab were discussed today in detail.  These include nausea, fatigue, and autoimmune complications among others.  After discussion today he is agreeable to consider this treatment at this time.  Alternative treatment would be surveillance alone which is mandatory at this time regardless whether he elects to proceed.  We will arrange starting the treatment of Pembrolizumab 200 mg every 3 weeks in the near future.  Education class will be arranged as well.   2.  IV access: Peripheral veins will be in use at this time.  3.  Antiemetics: Compazine will be available to him.   4.  Autoimmune complications: These will be monitored periodically.  I educated him about pneumonitis, colitis and thyroid disease.  5.  Follow-up: We will be in the near future to start treatment.  Thank you for the referral.  I had the pleasure of meeting this patient today.  A copy of this consult has been forwarded to the requesting physician.

## 2021-11-18 NOTE — Progress Notes (Signed)
START ON PATHWAY REGIMEN - Renal Cell     A cycle is every 21 days:     Pembrolizumab   **Always confirm dose/schedule in your pharmacy ordering system**  Patient Characteristics: Postoperative without Neoadjuvant Therapy, M0 (Pathologic Staging), Stage I, II, III, or Resected T4M0 Stage IV, Intermediate-High Risk Therapeutic Status: Postoperative without Neoadjuvant Therapy, M0 (Pathologic Staging) AJCC M Category: cM0 AJCC 8 Stage Grouping: III AJCC T Category: pT3a AJCC N Category: pN0 Risk Status: Intermediate-High Risk Intent of Therapy: Curative Intent, Discussed with Patient 

## 2021-11-24 ENCOUNTER — Other Ambulatory Visit: Payer: Self-pay

## 2021-11-25 ENCOUNTER — Inpatient Hospital Stay: Payer: Medicare Other

## 2021-11-26 NOTE — Progress Notes (Signed)
Pharmacist Chemotherapy Monitoring - Initial Assessment    Anticipated start date: 12/03/21   The following has been reviewed per standard work regarding the patient's treatment regimen: The patient's diagnosis, treatment plan and drug doses, and organ/hematologic function Lab orders and baseline tests specific to treatment regimen  The treatment plan start date, drug sequencing, and pre-medications Prior authorization status  Patient's documented medication list, including drug-drug interaction screen and prescriptions for anti-emetics and supportive care specific to the treatment regimen The drug concentrations, fluid compatibility, administration routes, and timing of the medications to be used The patient's access for treatment and lifetime cumulative dose history, if applicable  The patient's medication allergies and previous infusion related reactions, if applicable   Changes made to treatment plan:  N/A  Follow up needed:  N/A   Micheal Mcintyre, [place], 11/26/2021  10:29 AM

## 2021-12-01 ENCOUNTER — Other Ambulatory Visit: Payer: Self-pay

## 2021-12-01 ENCOUNTER — Inpatient Hospital Stay: Payer: Medicare Other

## 2021-12-02 ENCOUNTER — Other Ambulatory Visit: Payer: Self-pay

## 2021-12-02 ENCOUNTER — Other Ambulatory Visit (INDEPENDENT_AMBULATORY_CARE_PROVIDER_SITE_OTHER): Payer: Medicare Other

## 2021-12-02 DIAGNOSIS — E559 Vitamin D deficiency, unspecified: Secondary | ICD-10-CM

## 2021-12-03 ENCOUNTER — Inpatient Hospital Stay: Payer: Medicare Other

## 2021-12-03 ENCOUNTER — Other Ambulatory Visit: Payer: Self-pay

## 2021-12-03 ENCOUNTER — Inpatient Hospital Stay: Payer: Medicare Other | Attending: Oncology

## 2021-12-03 VITALS — BP 151/78 | HR 72 | Temp 97.5°F | Resp 17 | Wt 160.0 lb

## 2021-12-03 DIAGNOSIS — Z791 Long term (current) use of non-steroidal anti-inflammatories (NSAID): Secondary | ICD-10-CM | POA: Insufficient documentation

## 2021-12-03 DIAGNOSIS — Z79899 Other long term (current) drug therapy: Secondary | ICD-10-CM | POA: Diagnosis not present

## 2021-12-03 DIAGNOSIS — Z5112 Encounter for antineoplastic immunotherapy: Secondary | ICD-10-CM | POA: Insufficient documentation

## 2021-12-03 DIAGNOSIS — C641 Malignant neoplasm of right kidney, except renal pelvis: Secondary | ICD-10-CM

## 2021-12-03 DIAGNOSIS — C649 Malignant neoplasm of unspecified kidney, except renal pelvis: Secondary | ICD-10-CM | POA: Insufficient documentation

## 2021-12-03 LAB — CBC WITH DIFFERENTIAL (CANCER CENTER ONLY)
Abs Immature Granulocytes: 0.01 10*3/uL (ref 0.00–0.07)
Basophils Absolute: 0 10*3/uL (ref 0.0–0.1)
Basophils Relative: 1 %
Eosinophils Absolute: 0 10*3/uL (ref 0.0–0.5)
Eosinophils Relative: 1 %
HCT: 36.8 % — ABNORMAL LOW (ref 39.0–52.0)
Hemoglobin: 12.2 g/dL — ABNORMAL LOW (ref 13.0–17.0)
Immature Granulocytes: 0 %
Lymphocytes Relative: 14 %
Lymphs Abs: 0.8 10*3/uL (ref 0.7–4.0)
MCH: 31.3 pg (ref 26.0–34.0)
MCHC: 33.2 g/dL (ref 30.0–36.0)
MCV: 94.4 fL (ref 80.0–100.0)
Monocytes Absolute: 0.4 10*3/uL (ref 0.1–1.0)
Monocytes Relative: 8 %
Neutro Abs: 4.2 10*3/uL (ref 1.7–7.7)
Neutrophils Relative %: 76 %
Platelet Count: 176 10*3/uL (ref 150–400)
RBC: 3.9 MIL/uL — ABNORMAL LOW (ref 4.22–5.81)
RDW: 12.5 % (ref 11.5–15.5)
WBC Count: 5.5 10*3/uL (ref 4.0–10.5)
nRBC: 0 % (ref 0.0–0.2)

## 2021-12-03 LAB — CMP (CANCER CENTER ONLY)
ALT: 13 U/L (ref 0–44)
AST: 14 U/L — ABNORMAL LOW (ref 15–41)
Albumin: 4.2 g/dL (ref 3.5–5.0)
Alkaline Phosphatase: 135 U/L — ABNORMAL HIGH (ref 38–126)
Anion gap: 6 (ref 5–15)
BUN: 26 mg/dL — ABNORMAL HIGH (ref 8–23)
CO2: 24 mmol/L (ref 22–32)
Calcium: 9.1 mg/dL (ref 8.9–10.3)
Chloride: 108 mmol/L (ref 98–111)
Creatinine: 1.63 mg/dL — ABNORMAL HIGH (ref 0.61–1.24)
GFR, Estimated: 43 mL/min — ABNORMAL LOW (ref >60)
Glucose, Bld: 154 mg/dL — ABNORMAL HIGH (ref 70–99)
Potassium: 4.5 mmol/L (ref 3.5–5.1)
Sodium: 138 mmol/L (ref 135–145)
Total Bilirubin: 0.5 mg/dL (ref 0.3–1.2)
Total Protein: 6.9 g/dL (ref 6.5–8.1)

## 2021-12-03 LAB — TSH: TSH: 1.229 u[IU]/mL (ref 0.350–4.500)

## 2021-12-03 MED ORDER — SODIUM CHLORIDE 0.9 % IV SOLN
200.0000 mg | Freq: Once | INTRAVENOUS | Status: AC
  Administered 2021-12-03: 200 mg via INTRAVENOUS
  Filled 2021-12-03: qty 200

## 2021-12-03 MED ORDER — SODIUM CHLORIDE 0.9 % IV SOLN: Freq: Once | INTRAVENOUS | Status: AC

## 2021-12-03 NOTE — Patient Instructions (Signed)
[facility]  Discharge Instructions: Thank you for choosing [facility] to provide your oncology and hematology care.   If you have a lab appointment with the [facility], please go directly to the [facility] and check in at the registration area.   Wear comfortable clothing and clothing appropriate for easy access to any Portacath or PICC line.   We strive to give you quality time with your provider. You may need to reschedule your appointment if you arrive late (15 or more minutes).  Arriving late affects you and other patients whose appointments are after yours.  Also, if you miss three or more appointments without notifying the office, you may be dismissed from the clinic at the provider's discretion.      For prescription refill requests, have your pharmacy contact our office and allow 72 hours for refills to be completed.    Today you received the following chemotherapy and/or immunotherapy agent: Keytruda      To help prevent nausea and vomiting after your treatment, we encourage you to take your nausea medication as directed.  BELOW ARE SYMPTOMS THAT SHOULD BE REPORTED IMMEDIATELY: *FEVER GREATER THAN 100.4 F (38 C) OR HIGHER *CHILLS OR SWEATING *NAUSEA AND VOMITING THAT IS NOT CONTROLLED WITH YOUR NAUSEA MEDICATION *UNUSUAL SHORTNESS OF BREATH *UNUSUAL BRUISING OR BLEEDING *URINARY PROBLEMS (pain or burning when urinating, or frequent urination) *BOWEL PROBLEMS (unusual diarrhea, constipation, pain near the anus) TENDERNESS IN MOUTH AND THROAT WITH OR WITHOUT PRESENCE OF ULCERS (sore throat, sores in mouth, or a toothache) UNUSUAL RASH, SWELLING OR PAIN  UNUSUAL VAGINAL DISCHARGE OR ITCHING   Items with * indicate a potential emergency and should be followed up as soon as possible or go to the Emergency Department if any problems should occur.  Please show the CHEMOTHERAPY ALERT CARD or IMMUNOTHERAPY ALERT CARD at check-in to  the Emergency Department and triage nurse.  Should you have questions after your visit or need to cancel or reschedule your appointment, please contact [facility]  Dept: [phone]  and follow the prompts.  Office hours are 8:00 a.m. to 4:30 p.m. Monday - Friday. Please note that voicemails left after 4:00 p.m. may not be returned until the following business day.  We are closed weekends and major holidays. You have access to a nurse at all times for urgent questions. Please call the main number to the clinic Dept: [phone] and follow the prompts.   For any non-urgent questions, you may also contact your provider using MyChart. We now offer e-Visits for anyone 57 and older to request care online for non-urgent symptoms. For details visit [email or web address].   Also download the MyChart app! Go to the app store, search "MyChart", open the app, select [facility], and log in with your MyChart username and password.  Masks are optional in the cancer centers. If you would like for your care team to wear a mask while they are taking care of you, please let them know. You may have one support person who is at least 79 years old accompany you for your appointments.  Pembrolizumab injection What is this medication? PEMBROLIZUMAB (pem broe liz ue mab) is a monoclonal antibody. It is used to treat certain types of cancer. This medicine may be used for other purposes; ask your health care provider or pharmacist if you have questions. COMMON BRAND NAME(S): Keytruda What should I tell my care team before I take this medication? They  need to know if you have any of these conditions: autoimmune diseases like Crohn's disease, ulcerative colitis, or lupus have had or planning to have an allogeneic stem cell transplant (uses someone else's stem cells) history of organ transplant history of chest radiation nervous system problems like myasthenia gravis or Guillain-Barre  syndrome an unusual or allergic reaction to pembrolizumab, other medicines, foods, dyes, or preservatives pregnant or trying to get pregnant breast-feeding How should I use this medication? This medicine is for infusion into a vein. It is given by a health care professional in a hospital or clinic setting. A special MedGuide will be given to you before each treatment. Be sure to read this information carefully each time. Talk to your pediatrician regarding the use of this medicine in children. While this drug may be prescribed for children as young as 6 months for selected conditions, precautions do apply. Overdosage: If you think you have taken too much of this medicine contact a poison control center or emergency room at once. NOTE: This medicine is only for you. Do not share this medicine with others. What if I miss a dose? It is important not to miss your dose. Call your doctor or health care professional if you are unable to keep an appointment. What may interact with this medication? Interactions have not been studied. This list may not describe all possible interactions. Give your health care provider a list of all the medicines, herbs, non-prescription drugs, or dietary supplements you use. Also tell them if you smoke, drink alcohol, or use illegal drugs. Some items may interact with your medicine. What should I watch for while using this medication? Your condition will be monitored carefully while you are receiving this medicine. You may need blood work done while you are taking this medicine. Do not become pregnant while taking this medicine or for 4 months after stopping it. Women should inform their doctor if they wish to become pregnant or think they might be pregnant. There is a potential for serious side effects to an unborn child. Talk to your health care professional or pharmacist for more information. Do not breast-feed an infant while taking this medicine or for 4 months after  the last dose. What side effects may I notice from receiving this medication? Side effects that you should report to your doctor or health care professional as soon as possible: allergic reactions like skin rash, itching or hives, swelling of the face, lips, or tongue bloody or black, tarry breathing problems changes in vision chest pain chills confusion constipation cough diarrhea dizziness or feeling faint or lightheaded fast or irregular heartbeat fever flushing joint pain low blood counts - this medicine may decrease the number of white blood cells, red blood cells and platelets. You may be at increased risk for infections and bleeding. muscle pain muscle weakness pain, tingling, numbness in the hands or feet persistent headache redness, blistering, peeling or loosening of the skin, including inside the mouth signs and symptoms of high blood sugar such as dizziness; dry mouth; dry skin; fruity breath; nausea; stomach pain; increased hunger or thirst; increased urination signs and symptoms of kidney injury like trouble passing urine or change in the amount of urine signs and symptoms of liver injury like dark urine, light-colored stools, loss of appetite, nausea, right upper belly pain, yellowing of the eyes or skin sweating swollen lymph nodes weight loss Side effects that usually do not require medical attention (report to your doctor or health care professional if they continue  or are bothersome): decreased appetite hair loss tiredness This list may not describe all possible side effects. Call your doctor for medical advice about side effects. You may report side effects to FDA at 1-800-FDA-1088. Where should I keep my medication? This drug is given in a hospital or clinic and will not be stored at home. NOTE: This sheet is a summary. It may not cover all possible information. If you have questions about this medicine, talk to your doctor, pharmacist, or health care  provider.  2023 Elsevier/Gold Standard (2021-03-21 00:00:00)

## 2021-12-03 NOTE — Progress Notes (Signed)
Per Dr. Alen Blew - okay to proceed with elevated creatinine of 1.63.

## 2021-12-04 ENCOUNTER — Telehealth: Payer: Self-pay | Admitting: *Deleted

## 2021-12-04 NOTE — Telephone Encounter (Signed)
Called for chemo follow up. States everything went well. Denies any side effects. No questions or concerns

## 2021-12-04 NOTE — Telephone Encounter (Signed)
-----   Message from Dionne Ano, RN sent at 12/03/2021  2:59 PM EDT ----- Regarding: Follow up: 1st time Keytruda Dr Alen Blew 12/04/2021

## 2021-12-05 LAB — VITAMIN D 1,25 DIHYDROXY
Vitamin D 1, 25 (OH)2 Total: 24 pg/mL (ref 18–72)
Vitamin D2 1, 25 (OH)2: <8 pg/mL
Vitamin D3 1, 25 (OH)2: 24 pg/mL

## 2021-12-08 ENCOUNTER — Encounter: Payer: Self-pay | Admitting: *Deleted

## 2021-12-12 ENCOUNTER — Other Ambulatory Visit: Payer: Self-pay | Admitting: Oncology

## 2021-12-12 DIAGNOSIS — C641 Malignant neoplasm of right kidney, except renal pelvis: Secondary | ICD-10-CM

## 2021-12-14 ENCOUNTER — Other Ambulatory Visit: Payer: Self-pay | Admitting: Oncology

## 2021-12-24 ENCOUNTER — Encounter: Payer: Self-pay | Admitting: Oncology

## 2021-12-24 NOTE — Progress Notes (Signed)
Called pt to introduce myself as his Arboriculturist and to discuss copay assistance.  Pt gave me consent to apply in his behalf so I completed the online application w/ the [facility] and he was approved for $10,000 for Keytruda from 11/24/21 through 11/24/22.  Pt is overqualified for the [employer].  I will give him my card on 12/24/21 for any questions or concerns he may have in the future.

## 2021-12-25 ENCOUNTER — Other Ambulatory Visit: Payer: Self-pay

## 2021-12-25 ENCOUNTER — Inpatient Hospital Stay (HOSPITAL_BASED_OUTPATIENT_CLINIC_OR_DEPARTMENT_OTHER): Payer: Medicare Other | Admitting: Oncology

## 2021-12-25 ENCOUNTER — Inpatient Hospital Stay: Payer: Medicare Other

## 2021-12-25 VITALS — BP 143/71 | HR 71 | Temp 98.3°F | Resp 16 | Ht 69.0 in | Wt 162.1 lb

## 2021-12-25 DIAGNOSIS — C641 Malignant neoplasm of right kidney, except renal pelvis: Secondary | ICD-10-CM

## 2021-12-25 DIAGNOSIS — C649 Malignant neoplasm of unspecified kidney, except renal pelvis: Secondary | ICD-10-CM | POA: Diagnosis not present

## 2021-12-25 LAB — CBC WITH DIFFERENTIAL (CANCER CENTER ONLY)
Abs Immature Granulocytes: 0.01 10*3/uL (ref 0.00–0.07)
Basophils Absolute: 0.1 10*3/uL (ref 0.0–0.1)
Basophils Relative: 1 %
Eosinophils Absolute: 0.1 10*3/uL (ref 0.0–0.5)
Eosinophils Relative: 2 %
HCT: 38 % — ABNORMAL LOW (ref 39.0–52.0)
Hemoglobin: 12.8 g/dL — ABNORMAL LOW (ref 13.0–17.0)
Immature Granulocytes: 0 %
Lymphocytes Relative: 18 %
Lymphs Abs: 0.8 10*3/uL (ref 0.7–4.0)
MCH: 31.5 pg (ref 26.0–34.0)
MCHC: 33.7 g/dL (ref 30.0–36.0)
MCV: 93.6 fL (ref 80.0–100.0)
Monocytes Absolute: 0.4 10*3/uL (ref 0.1–1.0)
Monocytes Relative: 10 %
Neutro Abs: 3.1 10*3/uL (ref 1.7–7.7)
Neutrophils Relative %: 69 %
Platelet Count: 155 10*3/uL (ref 150–400)
RBC: 4.06 MIL/uL — ABNORMAL LOW (ref 4.22–5.81)
RDW: 12.6 % (ref 11.5–15.5)
WBC Count: 4.5 10*3/uL (ref 4.0–10.5)
nRBC: 0 % (ref 0.0–0.2)

## 2021-12-25 LAB — CMP (CANCER CENTER ONLY)
ALT: 14 U/L (ref 0–44)
AST: 15 U/L (ref 15–41)
Albumin: 4.1 g/dL (ref 3.5–5.0)
Alkaline Phosphatase: 107 U/L (ref 38–126)
Anion gap: 5 (ref 5–15)
BUN: 23 mg/dL (ref 8–23)
CO2: 25 mmol/L (ref 22–32)
Calcium: 9.2 mg/dL (ref 8.9–10.3)
Chloride: 110 mmol/L (ref 98–111)
Creatinine: 1.43 mg/dL — ABNORMAL HIGH (ref 0.61–1.24)
GFR, Estimated: 50 mL/min — ABNORMAL LOW (ref >60)
Glucose, Bld: 124 mg/dL — ABNORMAL HIGH (ref 70–99)
Potassium: 4.7 mmol/L (ref 3.5–5.1)
Sodium: 140 mmol/L (ref 135–145)
Total Bilirubin: 0.4 mg/dL (ref 0.3–1.2)
Total Protein: 6.5 g/dL (ref 6.5–8.1)

## 2021-12-25 LAB — TSH: TSH: 1.486 u[IU]/mL (ref 0.350–4.500)

## 2021-12-25 MED ORDER — SODIUM CHLORIDE 0.9 % IV SOLN
200.0000 mg | Freq: Once | INTRAVENOUS | Status: AC
  Administered 2021-12-25: 200 mg via INTRAVENOUS
  Filled 2021-12-25: qty 200

## 2021-12-25 MED ORDER — SODIUM CHLORIDE 0.9% FLUSH: 10.0000 mL | INTRAVENOUS | Status: DC | PRN

## 2021-12-25 MED ORDER — SODIUM CHLORIDE 0.9 % IV SOLN: Freq: Once | INTRAVENOUS | Status: AC

## 2021-12-25 MED ORDER — HEPARIN SOD (PORK) LOCK FLUSH 100 UNIT/ML IV SOLN: 500.0000 [IU] | Freq: Once | INTRAVENOUS | Status: DC | PRN

## 2021-12-25 NOTE — Patient Instructions (Signed)
[facility]  Discharge Instructions: Thank you for choosing [facility] to provide your oncology and hematology care.   If you have a lab appointment with the Cancer Center, please go directly to the Cancer Center and check in at the registration area.   Wear comfortable clothing and clothing appropriate for easy access to any Portacath or PICC line.   We strive to give you quality time with your provider. You may need to reschedule your appointment if you arrive late (15 or more minutes).  Arriving late affects you and other patients whose appointments are after yours.  Also, if you miss three or more appointments without notifying the office, you may be dismissed from the clinic at the provider's discretion.      For prescription refill requests, have your pharmacy contact our office and allow 72 hours for refills to be completed.    Today you received the following chemotherapy and/or immunotherapy agents: Keytruda.       To help prevent nausea and vomiting after your treatment, we encourage you to take your nausea medication as directed.  BELOW ARE SYMPTOMS THAT SHOULD BE REPORTED IMMEDIATELY: *FEVER GREATER THAN 100.4 F (38 C) OR HIGHER *CHILLS OR SWEATING *NAUSEA AND VOMITING THAT IS NOT CONTROLLED WITH YOUR NAUSEA MEDICATION *UNUSUAL SHORTNESS OF BREATH *UNUSUAL BRUISING OR BLEEDING *URINARY PROBLEMS (pain or burning when urinating, or frequent urination) *BOWEL PROBLEMS (unusual diarrhea, constipation, pain near the anus) TENDERNESS IN MOUTH AND THROAT WITH OR WITHOUT PRESENCE OF ULCERS (sore throat, sores in mouth, or a toothache) UNUSUAL RASH, SWELLING OR PAIN  UNUSUAL VAGINAL DISCHARGE OR ITCHING   Items with * indicate a potential emergency and should be followed up as soon as possible or go to the Emergency Department if any problems should occur.  Please show the CHEMOTHERAPY ALERT CARD or IMMUNOTHERAPY ALERT CARD at check-in to  the Emergency Department and triage nurse.  Should you have questions after your visit or need to cancel or reschedule your appointment, please contact [facility]  Dept: [phone]  and follow the prompts.  Office hours are 8:00 a.m. to 4:30 p.m. Monday - Friday. Please note that voicemails left after 4:00 p.m. may not be returned until the following business day.  We are closed weekends and major holidays. You have access to a nurse at all times for urgent questions. Please call the main number to the clinic Dept: [phone] and follow the prompts.   For any non-urgent questions, you may also contact your provider using MyChart. We now offer e-Visits for anyone 18 and older to request care online for non-urgent symptoms. For details visit [email or web address].   Also download the MyChart app! Go to the app store, search "MyChart", open the app, select [facility], and log in with your MyChart username and password.  Masks are optional in the cancer centers. If you would like for your care team to wear a mask while they are taking care of you, please let them know. You may have one support person who is at least 79 years old accompany you for your appointments. 

## 2021-12-25 NOTE — Progress Notes (Signed)
Hematology and Oncology Follow Up Visit  Micheal Mcintyre [id number] June 18, 1940 81 y.o. 12/25/2021 11:00 AM Micheal Mcintyre, MDBlyth, Micheal [name], MD   Principle Diagnosis: 81 year old with kidney cancer diagnosed in May 2023.  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    Current therapy: Pembrolizumab 200 mg every 3 weeks started on December 03, 2021.  He is here for cycle 2 of therapy.  Interim History: [name] returns today for routine follow-up.  Since last visit, he reports feeling well without any major complaints.  He tolerated the first cycle of Pembro without any complaints.  He denies any nausea, vomiting or abdominal pain.  He denies skin rash or pruritus.  He denies any hospitalizations or illnesses.     Medications: I have reviewed the patient's current medications.  Current Outpatient Medications  Medication Sig Dispense Refill   acetaminophen (TYLENOL) 500 MG tablet Take 1,000 mg by mouth every 6 (six) hours as needed for moderate pain.     aluminum hydroxide-magnesium carbonate (GAVISCON) 95-358 MG/15ML SUSP Take 15 mLs by mouth as needed for heartburn or indigestion.     Cholecalciferol (VITAMIN D3) 10 MCG (400 UNIT) tablet Take 800 Units by mouth daily.     diclofenac Sodium (VOLTAREN) 1 % GEL Apply 1 application. topically 4 (four) times daily as needed (pain).     docusate sodium (COLACE) 100 MG capsule Take 1 capsule (100 mg total) by mouth daily as needed for up to 30 doses. 30 capsule 0   ferrous sulfate 325 (65 FE) MG tablet Take 325 mg by mouth 4 (four) times a week.     ibuprofen (ADVIL) 200 MG tablet Take 200 mg by mouth daily.     Multiple Vitamin (MULTIVITAMIN WITH MINERALS) TABS tablet Take 1 tablet by mouth 4 (four) times a week.     Omega-3 350 MG CPDR Take 350 mg by mouth daily.     oxyCODONE-acetaminophen (PERCOCET) 5-325 MG tablet Take 1 tablet by mouth every 4 (four) hours as needed for up to 30 doses for severe pain. 30 tablet 0    prochlorperazine (COMPAZINE) 10 MG tablet Take 1 tablet (10 mg total) by mouth every 6 (six) hours as needed for nausea or vomiting. 30 tablet 0   tamsulosin (FLOMAX) 0.4 MG CAPS capsule Take 1 capsule (0.4 mg total) by mouth daily after supper. 90 capsule 3   telmisartan (MICARDIS) 80 MG tablet Take 0.5 tablets (40 mg total) by mouth 2 (two) times daily. 180 tablet 1   TURMERIC PO Take 1 tablet by mouth 3 (three) times a week.     No current facility-administered medications for this visit.     Allergies: No Known Allergies    Physical Exam: Blood pressure (!) 143/71, pulse 71, temperature 98.3 F (36.8 C), temperature source Temporal, resp. rate 16, height '5\' 9"'$  (1.753 m), weight 162 lb 1.6 oz (73.5 kg), SpO2 96 %.  ECOG: 1   General appearance: Comfortable appearing without any discomfort Head: Normocephalic without any trauma Oropharynx: Mucous membranes are moist and pink without any thrush or ulcers. Eyes: Pupils are equal and round reactive to light. Lymph nodes: No cervical, supraclavicular, inguinal or axillary lymphadenopathy.   Heart:regular rate and rhythm.  S1 and S2 without leg edema. Lung: Clear without any rhonchi or wheezes.  No dullness to percussion. Abdomin: Soft, nontender, nondistended with good bowel sounds.  No hepatosplenomegaly. Musculoskeletal: No joint deformity or effusion.  Full range of motion noted.  Neurological: No deficits noted on motor, sensory and deep tendon reflex exam. Skin: No petechial rash or dryness.  Appeared moist.      Lab Results: Lab Results  Component Value Date   WBC 5.5 12/03/2021   HGB 12.2 (L) 12/03/2021   HCT 36.8 (L) 12/03/2021   MCV 94.4 12/03/2021   PLT 176 12/03/2021   PSA 0.84 03/20/2021     Chemistry      Component Value Date/Time   NA 138 12/03/2021 1230   K 4.5 12/03/2021 1230   CL 108 12/03/2021 1230   CO2 24 12/03/2021 1230   BUN 26 (H) 12/03/2021 1230   CREATININE 1.63 (H) 12/03/2021 1230    CREATININE 1.09 02/06/2020 1306      Component Value Date/Time   CALCIUM 9.1 12/03/2021 1230   ALKPHOS 135 (H) 12/03/2021 1230   AST 14 (L) 12/03/2021 1230   ALT 13 12/03/2021 1230   BILITOT 0.5 12/03/2021 1230        Impression and Plan:    81 year old with:  1.  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   He is currently receiving adjuvant Pembrolizumab without any major complications.  Risks and benefits of continuing this treatment for 1 year were discussed.  These complications include GI toxicity as well as autoimmune situations.  He is agreeable to proceed and we will update his staging scan in the next 4 to 6 months.   2.  IV access: I recommended deferring Port-A-Cath for the time being.   3.  Antiemetics: No nausea or vomiting reported at this time.  Compazine is available to him.   4.  Autoimmune complications: I continue to educate him about this complication clinic pneumonitis, colitis and thyroid disease.  5.  Follow-up: In 3 weeks for the next cycle of therapy.   30  minutes were spent on this encounter.  The time was dedicated to reviewing his disease status, treatment choices and addressing complication related to his cancer and cancer therapy.     Zola Button, MD 8/24/202311:00 AM

## 2022-01-15 ENCOUNTER — Inpatient Hospital Stay (HOSPITAL_BASED_OUTPATIENT_CLINIC_OR_DEPARTMENT_OTHER): Payer: Medicare Other | Admitting: Oncology

## 2022-01-15 ENCOUNTER — Inpatient Hospital Stay: Payer: Medicare Other

## 2022-01-15 ENCOUNTER — Inpatient Hospital Stay: Payer: Medicare Other | Attending: Oncology

## 2022-01-15 VITALS — BP 138/83 | HR 70 | Temp 97.9°F | Resp 14 | Ht 69.0 in | Wt 163.6 lb

## 2022-01-15 DIAGNOSIS — C641 Malignant neoplasm of right kidney, except renal pelvis: Secondary | ICD-10-CM | POA: Diagnosis not present

## 2022-01-15 DIAGNOSIS — Z5112 Encounter for antineoplastic immunotherapy: Secondary | ICD-10-CM | POA: Diagnosis not present

## 2022-01-15 DIAGNOSIS — C649 Malignant neoplasm of unspecified kidney, except renal pelvis: Secondary | ICD-10-CM | POA: Insufficient documentation

## 2022-01-15 DIAGNOSIS — M791 Myalgia, unspecified site: Secondary | ICD-10-CM | POA: Diagnosis not present

## 2022-01-15 DIAGNOSIS — Z79899 Other long term (current) drug therapy: Secondary | ICD-10-CM | POA: Insufficient documentation

## 2022-01-15 DIAGNOSIS — Z791 Long term (current) use of non-steroidal anti-inflammatories (NSAID): Secondary | ICD-10-CM | POA: Insufficient documentation

## 2022-01-15 DIAGNOSIS — M2550 Pain in unspecified joint: Secondary | ICD-10-CM | POA: Insufficient documentation

## 2022-01-15 LAB — CBC WITH DIFFERENTIAL (CANCER CENTER ONLY)
Abs Immature Granulocytes: 0.02 10*3/uL (ref 0.00–0.07)
Basophils Absolute: 0.1 10*3/uL (ref 0.0–0.1)
Basophils Relative: 1 %
Eosinophils Absolute: 0.2 10*3/uL (ref 0.0–0.5)
Eosinophils Relative: 3 %
HCT: 40 % (ref 39.0–52.0)
Hemoglobin: 13.5 g/dL (ref 13.0–17.0)
Immature Granulocytes: 0 %
Lymphocytes Relative: 19 %
Lymphs Abs: 1.1 10*3/uL (ref 0.7–4.0)
MCH: 31.4 pg (ref 26.0–34.0)
MCHC: 33.8 g/dL (ref 30.0–36.0)
MCV: 93 fL (ref 80.0–100.0)
Monocytes Absolute: 0.6 10*3/uL (ref 0.1–1.0)
Monocytes Relative: 10 %
Neutro Abs: 4 10*3/uL (ref 1.7–7.7)
Neutrophils Relative %: 67 %
Platelet Count: 151 10*3/uL (ref 150–400)
RBC: 4.3 MIL/uL (ref 4.22–5.81)
RDW: 12.8 % (ref 11.5–15.5)
WBC Count: 6 10*3/uL (ref 4.0–10.5)
nRBC: 0 % (ref 0.0–0.2)

## 2022-01-15 LAB — CMP (CANCER CENTER ONLY)
ALT: 14 U/L (ref 0–44)
AST: 14 U/L — ABNORMAL LOW (ref 15–41)
Albumin: 4.3 g/dL (ref 3.5–5.0)
Alkaline Phosphatase: 96 U/L (ref 38–126)
Anion gap: 7 (ref 5–15)
BUN: 32 mg/dL — ABNORMAL HIGH (ref 8–23)
CO2: 23 mmol/L (ref 22–32)
Calcium: 9.6 mg/dL (ref 8.9–10.3)
Chloride: 107 mmol/L (ref 98–111)
Creatinine: 1.82 mg/dL — ABNORMAL HIGH (ref 0.61–1.24)
GFR, Estimated: 38 mL/min — ABNORMAL LOW (ref >60)
Glucose, Bld: 108 mg/dL — ABNORMAL HIGH (ref 70–99)
Potassium: 4.7 mmol/L (ref 3.5–5.1)
Sodium: 137 mmol/L (ref 135–145)
Total Bilirubin: 0.5 mg/dL (ref 0.3–1.2)
Total Protein: 6.8 g/dL (ref 6.5–8.1)

## 2022-01-15 LAB — TSH: TSH: 1.477 u[IU]/mL (ref 0.350–4.500)

## 2022-01-15 MED ORDER — SODIUM CHLORIDE 0.9 % IV SOLN
200.0000 mg | Freq: Once | INTRAVENOUS | Status: AC
  Administered 2022-01-15: 200 mg via INTRAVENOUS
  Filled 2022-01-15: qty 200

## 2022-01-15 MED ORDER — SODIUM CHLORIDE 0.9 % IV SOLN: Freq: Once | INTRAVENOUS | Status: AC

## 2022-01-15 NOTE — Progress Notes (Signed)
Per Dr Alen Blew ok to treat today with Scr of 1.82.

## 2022-01-15 NOTE — Patient Instructions (Signed)
[facility]  Discharge Instructions: Thank you for choosing [facility] to provide your oncology and hematology care.   If you have a lab appointment with the Cancer Center, please go directly to the Cancer Center and check in at the registration area.   Wear comfortable clothing and clothing appropriate for easy access to any Portacath or PICC line.   We strive to give you quality time with your provider. You may need to reschedule your appointment if you arrive late (15 or more minutes).  Arriving late affects you and other patients whose appointments are after yours.  Also, if you miss three or more appointments without notifying the office, you may be dismissed from the clinic at the provider's discretion.      For prescription refill requests, have your pharmacy contact our office and allow 72 hours for refills to be completed.    Today you received the following chemotherapy and/or immunotherapy agents: Keytruda.       To help prevent nausea and vomiting after your treatment, we encourage you to take your nausea medication as directed.  BELOW ARE SYMPTOMS THAT SHOULD BE REPORTED IMMEDIATELY: *FEVER GREATER THAN 100.4 F (38 C) OR HIGHER *CHILLS OR SWEATING *NAUSEA AND VOMITING THAT IS NOT CONTROLLED WITH YOUR NAUSEA MEDICATION *UNUSUAL SHORTNESS OF BREATH *UNUSUAL BRUISING OR BLEEDING *URINARY PROBLEMS (pain or burning when urinating, or frequent urination) *BOWEL PROBLEMS (unusual diarrhea, constipation, pain near the anus) TENDERNESS IN MOUTH AND THROAT WITH OR WITHOUT PRESENCE OF ULCERS (sore throat, sores in mouth, or a toothache) UNUSUAL RASH, SWELLING OR PAIN  UNUSUAL VAGINAL DISCHARGE OR ITCHING   Items with * indicate a potential emergency and should be followed up as soon as possible or go to the Emergency Department if any problems should occur.  Please show the CHEMOTHERAPY ALERT CARD or IMMUNOTHERAPY ALERT CARD at check-in to  the Emergency Department and triage nurse.  Should you have questions after your visit or need to cancel or reschedule your appointment, please contact [facility]  Dept: [phone]  and follow the prompts.  Office hours are 8:00 a.m. to 4:30 p.m. Monday - Friday. Please note that voicemails left after 4:00 p.m. may not be returned until the following business day.  We are closed weekends and major holidays. You have access to a nurse at all times for urgent questions. Please call the main number to the clinic Dept: [phone] and follow the prompts.   For any non-urgent questions, you may also contact your provider using MyChart. We now offer e-Visits for anyone 18 and older to request care online for non-urgent symptoms. For details visit [email or web address].   Also download the MyChart app! Go to the app store, search "MyChart", open the app, select [facility], and log in with your MyChart username and password.  Masks are optional in the cancer centers. If you would like for your care team to wear a mask while they are taking care of you, please let them know. You may have one support person who is at least 79 years old accompany you for your appointments. 

## 2022-01-15 NOTE — Progress Notes (Signed)
Hematology and Oncology Follow Up Visit  Micheal Mcintyre [id number] May 05, 1940 81 y.o. 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  He was found to have papillary and rhabdoid features.    Current therapy: Pembrolizumab 200 mg every 3 weeks started on December 03, 2021.  He is here for cycle 2 of therapy.  Interim History: Micheal Mcintyre presents today for a follow-up visit.  Since the last visit, he reports no major changes in his health.  He denies any hospitalizations or illnesses.  He denies any bone pain or pathological fractures.  He does report some arthralgias related due to osteoarthritis which has not changed dramatically.     Medications: Updated on review. Current Outpatient Medications  Medication Sig Dispense Refill   acetaminophen (TYLENOL) 500 MG tablet Take 1,000 mg by mouth every 6 (six) hours as needed for moderate pain.     aluminum hydroxide-magnesium carbonate (GAVISCON) 95-358 MG/15ML SUSP Take 15 mLs by mouth as needed for heartburn or indigestion.     Cholecalciferol (VITAMIN D3) 10 MCG (400 UNIT) tablet Take 800 Units by mouth daily.     diclofenac Sodium (VOLTAREN) 1 % GEL Apply 1 application. topically 4 (four) times daily as needed (pain).     docusate sodium (COLACE) 100 MG capsule Take 1 capsule (100 mg total) by mouth daily as needed for up to 30 doses. 30 capsule 0   ferrous sulfate 325 (65 FE) MG tablet Take 325 mg by mouth 4 (four) times a week.     ibuprofen (ADVIL) 200 MG tablet Take 200 mg by mouth daily.     Multiple Vitamin (MULTIVITAMIN WITH MINERALS) TABS tablet Take 1 tablet by mouth 4 (four) times a week.     Omega-3 350 MG CPDR Take 350 mg by mouth daily.     oxyCODONE-acetaminophen (PERCOCET) 5-325 MG tablet Take 1 tablet by mouth every 4 (four) hours as needed for up to 30 doses for severe pain. 30 tablet 0   prochlorperazine (COMPAZINE) 10 MG tablet Take  1 tablet (10 mg total) by mouth every 6 (six) hours as needed for nausea or vomiting. 30 tablet 0   tamsulosin (FLOMAX) 0.4 MG CAPS capsule Take 1 capsule (0.4 mg total) by mouth daily after supper. 90 capsule 3   telmisartan (MICARDIS) 80 MG tablet Take 0.5 tablets (40 mg total) by mouth 2 (two) times daily. 180 tablet 1   TURMERIC PO Take 1 tablet by mouth 3 (three) times a week.     No current facility-administered medications for this visit.     Allergies: No Known Allergies    Physical Exam: Blood pressure 138/83, pulse 70, temperature 97.9 F (36.6 C), temperature source Temporal, resp. rate 14, height '5\' 9"'$  (1.753 m), weight 163 lb 9.6 oz (74.2 kg), SpO2 98 %.  ECOG: 1  General appearance: Alert, awake without any distress. Head: Atraumatic without abnormalities Oropharynx: Without any thrush or ulcers. Eyes: No scleral icterus. Lymph nodes: No lymphadenopathy noted in the cervical, supraclavicular, or axillary nodes Heart:regular rate and rhythm, without any murmurs or gallops.   Lung: Clear to auscultation without any rhonchi, wheezes or dullness to percussion. Abdomin: Soft, nontender without any shifting dullness or ascites. Musculoskeletal: No clubbing or cyanosis. Neurological: No motor or sensory deficits. Skin: No rashes or lesions.      Lab Results: Lab Results  Component Value Date   WBC 6.0 01/15/2022  HGB 13.5 01/15/2022   HCT 40.0 01/15/2022   MCV 93.0 01/15/2022   PLT 151 01/15/2022   PSA 0.84 03/20/2021     Chemistry      Component Value Date/Time   NA 140 12/25/2021 1123   K 4.7 12/25/2021 1123   CL 110 12/25/2021 1123   CO2 25 12/25/2021 1123   BUN 23 12/25/2021 1123   CREATININE 1.43 (H) 12/25/2021 1123   CREATININE 1.09 02/06/2020 1306      Component Value Date/Time   CALCIUM 9.2 12/25/2021 1123   ALKPHOS 107 12/25/2021 1123   AST 15 12/25/2021 1123   ALT 14 12/25/2021 1123   BILITOT 0.4 12/25/2021 1123        Impression  and Plan:    54 year old with:  1.  Kidney cancer diagnosed in May 2023.  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   The natural course of his disease was reviewed at this time and risks and benefits of continuing adjuvant Pembrolizumab were discussed.  Complications that include arthralgias, myalgias and pruritus were reiterated.  Plan is to complete 17 cycles total therapy over 1 year.  We will update his staging scan in the next 4 months.   2.  IV access: No issues reported with peripheral veins.  Port-A-Cath has been deferred.   3.  Antiemetics: Compazine is available to her without any nausea or vomiting.   4.  Autoimmune complications: He has not experienced any complication clued pneumonitis, colitis and thyroid disease.  5.  Follow-up: In 3 weeks for repeat follow-up.   30  minutes were  dedicated to this visit.  Time was spent on reviewing his disease status, treatment choices and addressing complication related to cancer and cancer therapy.     Zola Button, MD 9/14/202311:58 AM

## 2022-01-16 LAB — T4: T4, Total: 5.7 ug/dL (ref 4.5–12.0)

## 2022-01-19 ENCOUNTER — Other Ambulatory Visit: Payer: Self-pay | Admitting: Urology

## 2022-01-19 DIAGNOSIS — D49511 Neoplasm of unspecified behavior of right kidney: Secondary | ICD-10-CM

## 2022-01-21 ENCOUNTER — Telehealth: Payer: Self-pay | Admitting: Oncology

## 2022-01-21 NOTE — Telephone Encounter (Signed)
Scheduled per 09/14 work-queue, patient has been called and voicemail was left.

## 2022-02-03 ENCOUNTER — Ambulatory Visit
Admission: RE | Admit: 2022-02-03 | Discharge: 2022-02-03 | Disposition: A | Discharge status: Home / Self Care | Payer: Medicare Other | Source: Ambulatory Visit | Attending: Urology | Admitting: Urology

## 2022-02-03 DIAGNOSIS — D49511 Neoplasm of unspecified behavior of right kidney: Secondary | ICD-10-CM

## 2022-02-03 MED ORDER — GADOPICLENOL 0.5 MMOL/ML IV SOLN
8.0000 mL | Freq: Once | INTRAVENOUS | Status: AC | PRN
  Administered 2022-02-03: 8 mL via INTRAVENOUS

## 2022-02-05 ENCOUNTER — Inpatient Hospital Stay: Payer: Medicare Other | Attending: Oncology

## 2022-02-05 ENCOUNTER — Other Ambulatory Visit: Payer: Self-pay

## 2022-02-05 ENCOUNTER — Inpatient Hospital Stay (HOSPITAL_BASED_OUTPATIENT_CLINIC_OR_DEPARTMENT_OTHER): Payer: Medicare Other | Admitting: Oncology

## 2022-02-05 ENCOUNTER — Inpatient Hospital Stay: Payer: Medicare Other

## 2022-02-05 VITALS — BP 121/65 | HR 84 | Temp 97.7°F | Resp 16 | Ht 69.0 in | Wt 163.1 lb

## 2022-02-05 DIAGNOSIS — Z791 Long term (current) use of non-steroidal anti-inflammatories (NSAID): Secondary | ICD-10-CM | POA: Insufficient documentation

## 2022-02-05 DIAGNOSIS — Z5112 Encounter for antineoplastic immunotherapy: Secondary | ICD-10-CM | POA: Insufficient documentation

## 2022-02-05 DIAGNOSIS — C649 Malignant neoplasm of unspecified kidney, except renal pelvis: Secondary | ICD-10-CM | POA: Diagnosis not present

## 2022-02-05 DIAGNOSIS — Z79899 Other long term (current) drug therapy: Secondary | ICD-10-CM | POA: Insufficient documentation

## 2022-02-05 DIAGNOSIS — C641 Malignant neoplasm of right kidney, except renal pelvis: Secondary | ICD-10-CM

## 2022-02-05 LAB — CBC WITH DIFFERENTIAL (CANCER CENTER ONLY)
Abs Immature Granulocytes: 0.02 10*3/uL (ref 0.00–0.07)
Basophils Absolute: 0.1 10*3/uL (ref 0.0–0.1)
Basophils Relative: 1 %
Eosinophils Absolute: 0.1 10*3/uL (ref 0.0–0.5)
Eosinophils Relative: 2 %
HCT: 40.7 % (ref 39.0–52.0)
Hemoglobin: 13.8 g/dL (ref 13.0–17.0)
Immature Granulocytes: 0 %
Lymphocytes Relative: 19 %
Lymphs Abs: 1 10*3/uL (ref 0.7–4.0)
MCH: 31.2 pg (ref 26.0–34.0)
MCHC: 33.9 g/dL (ref 30.0–36.0)
MCV: 91.9 fL (ref 80.0–100.0)
Monocytes Absolute: 0.5 10*3/uL (ref 0.1–1.0)
Monocytes Relative: 10 %
Neutro Abs: 3.7 10*3/uL (ref 1.7–7.7)
Neutrophils Relative %: 68 %
Platelet Count: 151 10*3/uL (ref 150–400)
RBC: 4.43 MIL/uL (ref 4.22–5.81)
RDW: 12.7 % (ref 11.5–15.5)
WBC Count: 5.3 10*3/uL (ref 4.0–10.5)
nRBC: 0 % (ref 0.0–0.2)

## 2022-02-05 LAB — CMP (CANCER CENTER ONLY)
ALT: 15 U/L (ref 0–44)
AST: 15 U/L (ref 15–41)
Albumin: 4.4 g/dL (ref 3.5–5.0)
Alkaline Phosphatase: 78 U/L (ref 38–126)
Anion gap: 6 (ref 5–15)
BUN: 31 mg/dL — ABNORMAL HIGH (ref 8–23)
CO2: 26 mmol/L (ref 22–32)
Calcium: 9.5 mg/dL (ref 8.9–10.3)
Chloride: 106 mmol/L (ref 98–111)
Creatinine: 1.71 mg/dL — ABNORMAL HIGH (ref 0.61–1.24)
GFR, Estimated: 40 mL/min — ABNORMAL LOW (ref >60)
Glucose, Bld: 93 mg/dL (ref 70–99)
Potassium: 4.9 mmol/L (ref 3.5–5.1)
Sodium: 138 mmol/L (ref 135–145)
Total Bilirubin: 0.8 mg/dL (ref 0.3–1.2)
Total Protein: 7.4 g/dL (ref 6.5–8.1)

## 2022-02-05 LAB — TSH: TSH: 1.638 u[IU]/mL (ref 0.350–4.500)

## 2022-02-05 MED ORDER — SODIUM CHLORIDE 0.9 % IV SOLN
200.0000 mg | Freq: Once | INTRAVENOUS | Status: AC
  Administered 2022-02-05: 200 mg via INTRAVENOUS
  Filled 2022-02-05: qty 200

## 2022-02-05 MED ORDER — SODIUM CHLORIDE 0.9 % IV SOLN: Freq: Once | INTRAVENOUS | Status: AC

## 2022-02-05 NOTE — Patient Instructions (Signed)
[facility]  Discharge Instructions: Thank you for choosing [facility] to provide your oncology and hematology care.   If you have a lab appointment with the Cancer Center, please go directly to the Cancer Center and check in at the registration area.   Wear comfortable clothing and clothing appropriate for easy access to any Portacath or PICC line.   We strive to give you quality time with your provider. You may need to reschedule your appointment if you arrive late (15 or more minutes).  Arriving late affects you and other patients whose appointments are after yours.  Also, if you miss three or more appointments without notifying the office, you may be dismissed from the clinic at the provider's discretion.      For prescription refill requests, have your pharmacy contact our office and allow 72 hours for refills to be completed.    Today you received the following chemotherapy and/or immunotherapy agents: Keytruda.       To help prevent nausea and vomiting after your treatment, we encourage you to take your nausea medication as directed.  BELOW ARE SYMPTOMS THAT SHOULD BE REPORTED IMMEDIATELY: *FEVER GREATER THAN 100.4 F (38 C) OR HIGHER *CHILLS OR SWEATING *NAUSEA AND VOMITING THAT IS NOT CONTROLLED WITH YOUR NAUSEA MEDICATION *UNUSUAL SHORTNESS OF BREATH *UNUSUAL BRUISING OR BLEEDING *URINARY PROBLEMS (pain or burning when urinating, or frequent urination) *BOWEL PROBLEMS (unusual diarrhea, constipation, pain near the anus) TENDERNESS IN MOUTH AND THROAT WITH OR WITHOUT PRESENCE OF ULCERS (sore throat, sores in mouth, or a toothache) UNUSUAL RASH, SWELLING OR PAIN  UNUSUAL VAGINAL DISCHARGE OR ITCHING   Items with * indicate a potential emergency and should be followed up as soon as possible or go to the Emergency Department if any problems should occur.  Please show the CHEMOTHERAPY ALERT CARD or IMMUNOTHERAPY ALERT CARD at check-in to  the Emergency Department and triage nurse.  Should you have questions after your visit or need to cancel or reschedule your appointment, please contact [facility]  Dept: [phone]  and follow the prompts.  Office hours are 8:00 a.m. to 4:30 p.m. Monday - Friday. Please note that voicemails left after 4:00 p.m. may not be returned until the following business day.  We are closed weekends and major holidays. You have access to a nurse at all times for urgent questions. Please call the main number to the clinic Dept: [phone] and follow the prompts.   For any non-urgent questions, you may also contact your provider using MyChart. We now offer e-Visits for anyone 18 and older to request care online for non-urgent symptoms. For details visit [email or web address].   Also download the MyChart app! Go to the app store, search "MyChart", open the app, select [place], and log in with your MyChart username and password.  Masks are optional in the cancer centers. If you would like for your care team to wear a mask while they are taking care of you, please let them know. You may have one support Elida Harbin who is at least 79 years old accompany you for your appointments. 

## 2022-02-05 NOTE — Progress Notes (Signed)
Per Dr. Alen Blew, okay to treat with creatinine 1.71.

## 2022-02-05 NOTE — Progress Notes (Signed)
Hematology and Oncology Follow Up Visit  Micheal Mcintyre [id number] [birth year] 81 y.o. 02/05/2022 11:29 AM Micheal Mcintyre, MDShadad, Micheal Dad, MD   Principle Diagnosis: 87 year old with kidney cancer diagnosed in May 2023.  He was found to have T3a tumor with papillary and rhabdoid features.    Current therapy: Pembrolizumab 200 mg every 3 weeks started on December 03, 2021.  He is here for cycle 4 of therapy.  Interim History: [name] returns today for a follow-up evaluation.  Since the last visit, he reports no major changes in his health.  He continues to tolerate current therapy without any complaints.  He denies any nausea, vomiting or abdominal pain.  He does report occasional pruritus but no skin rash.  His performance status quality of life remains unchanged.     Medications: Reviewed without changes. Current Outpatient Medications  Medication Sig Dispense Refill   acetaminophen (TYLENOL) 500 MG tablet Take 1,000 mg by mouth every 6 (six) hours as needed for moderate pain.     aluminum hydroxide-magnesium carbonate (GAVISCON) 95-358 MG/15ML SUSP Take 15 mLs by mouth as needed for heartburn or indigestion.     Cholecalciferol (VITAMIN D3) 10 MCG (400 UNIT) tablet Take 800 Units by mouth daily.     diclofenac Sodium (VOLTAREN) 1 % GEL Apply 1 application. topically 4 (four) times daily as needed (pain).     docusate sodium (COLACE) 100 MG capsule Take 1 capsule (100 mg total) by mouth daily as needed for up to 30 doses. 30 capsule 0   ferrous sulfate 325 (65 FE) MG tablet Take 325 mg by mouth 4 (four) times a week.     ibuprofen (ADVIL) 200 MG tablet Take 200 mg by mouth daily.     Multiple Vitamin (MULTIVITAMIN WITH MINERALS) TABS tablet Take 1 tablet by mouth 4 (four) times a week.     Omega-3 350 MG CPDR Take 350 mg by mouth daily.     oxyCODONE-acetaminophen (PERCOCET) 5-325 MG tablet Take 1 tablet by mouth every 4 (four) hours as needed for up to 30 doses for severe pain. 30 tablet  0   prochlorperazine (COMPAZINE) 10 MG tablet Take 1 tablet (10 mg total) by mouth every 6 (six) hours as needed for nausea or vomiting. 30 tablet 0   tamsulosin (FLOMAX) 0.4 MG CAPS capsule Take 1 capsule (0.4 mg total) by mouth daily after supper. 90 capsule 3   telmisartan (MICARDIS) 80 MG tablet Take 0.5 tablets (40 mg total) by mouth 2 (two) times daily. 180 tablet 1   TURMERIC PO Take 1 tablet by mouth 3 (three) times a week.     No current facility-administered medications for this visit.     Allergies: No Known Allergies    Physical Exam: Blood pressure 121/65, pulse 84, temperature 97.7 F (36.5 C), temperature source Temporal, resp. rate 16, height '5\' 9"'$  (1.753 m), weight 163 lb 1.6 oz (74 kg), SpO2 98 %.   ECOG: 1    General appearance: Comfortable appearing without any discomfort Head: Normocephalic without any trauma Oropharynx: Mucous membranes are moist and pink without any thrush or ulcers. Eyes: Pupils are equal and round reactive to light. Lymph nodes: No cervical, supraclavicular, inguinal or axillary lymphadenopathy.   Heart:regular rate and rhythm.  S1 and S2 without leg edema. Lung: Clear without any rhonchi or wheezes.  No dullness to percussion. Abdomin: Soft, nontender, nondistended with good bowel sounds.  No hepatosplenomegaly. Musculoskeletal: No joint deformity or effusion.  Full range of  motion noted. Neurological: No deficits noted on motor, sensory and deep tendon reflex exam. Skin: No petechial rash or dryness.  Appeared moist.       Lab Results: Lab Results  Component Value Date   WBC 6.0 01/15/2022   HGB 13.5 01/15/2022   HCT 40.0 01/15/2022   MCV 93.0 01/15/2022   PLT 151 01/15/2022   PSA 0.84 03/20/2021     Chemistry      Component Value Date/Time   NA 137 01/15/2022 1132   K 4.7 01/15/2022 1132   CL 107 01/15/2022 1132   CO2 23 01/15/2022 1132   BUN 32 (H) 01/15/2022 1132   CREATININE 1.82 (H) 01/15/2022 1132   CREATININE  1.09 02/06/2020 1306      Component Value Date/Time   CALCIUM 9.6 01/15/2022 1132   ALKPHOS 96 01/15/2022 1132   AST 14 (L) 01/15/2022 1132   ALT 14 01/15/2022 1132   BILITOT 0.5 01/15/2022 1132        Impression and Plan:    81 year old with:  1.  T3a kidney cancer diagnosed in May 2023.  He was found to have rhabdoid features indicating high risk disease.   He is currently on adjuvant Pembrolizumab without any major complications.  Risks and benefits of continuing this treatment were discussed.  Completing at 1 year of therapy has been recommended and approved for this particular indication.  Surveillance scans will be required and will be repeated in 3 months.   2.  IV access: Port option has been discussed and the peripheral veins for the time being are preferred.   3.  Antiemetics: No nausea or vomiting reported at this time.  Compazine is available to him.   4.  Autoimmune complications: I continue to educate him about potential complication clued pneumonitis, colitis and thyroid disease.  5.  Follow-up: In 3 weeks for a follow-up.   30  minutes were spent on this encounter.  The time was dedicated to updating disease status, treatment choices and outlining future plan of care reviewed.     Zola Button, MD 10/5/202311:29 AM

## 2022-02-17 ENCOUNTER — Telehealth: Payer: Self-pay | Admitting: Oncology

## 2022-02-17 NOTE — Telephone Encounter (Signed)
Called patient regarding upcoming October and November appointments, patient is notified.  

## 2022-02-26 ENCOUNTER — Other Ambulatory Visit: Payer: Self-pay

## 2022-02-26 ENCOUNTER — Inpatient Hospital Stay: Payer: Medicare Other

## 2022-02-26 ENCOUNTER — Inpatient Hospital Stay (HOSPITAL_BASED_OUTPATIENT_CLINIC_OR_DEPARTMENT_OTHER): Payer: Medicare Other | Admitting: Oncology

## 2022-02-26 VITALS — BP 137/68 | HR 68 | Temp 97.6°F | Resp 17 | Ht 69.0 in | Wt 165.8 lb

## 2022-02-26 DIAGNOSIS — C641 Malignant neoplasm of right kidney, except renal pelvis: Secondary | ICD-10-CM | POA: Diagnosis not present

## 2022-02-26 DIAGNOSIS — C649 Malignant neoplasm of unspecified kidney, except renal pelvis: Secondary | ICD-10-CM | POA: Diagnosis not present

## 2022-02-26 LAB — CMP (CANCER CENTER ONLY)
ALT: 16 U/L (ref 0–44)
AST: 15 U/L (ref 15–41)
Albumin: 4.3 g/dL (ref 3.5–5.0)
Alkaline Phosphatase: 92 U/L (ref 38–126)
Anion gap: 7 (ref 5–15)
BUN: 24 mg/dL — ABNORMAL HIGH (ref 8–23)
CO2: 25 mmol/L (ref 22–32)
Calcium: 9.5 mg/dL (ref 8.9–10.3)
Chloride: 106 mmol/L (ref 98–111)
Creatinine: 1.52 mg/dL — ABNORMAL HIGH (ref 0.61–1.24)
GFR, Estimated: 47 mL/min — ABNORMAL LOW (ref >60)
Glucose, Bld: 96 mg/dL (ref 70–99)
Potassium: 4.6 mmol/L (ref 3.5–5.1)
Sodium: 138 mmol/L (ref 135–145)
Total Bilirubin: 0.7 mg/dL (ref 0.3–1.2)
Total Protein: 7 g/dL (ref 6.5–8.1)

## 2022-02-26 LAB — CBC WITH DIFFERENTIAL (CANCER CENTER ONLY)
Abs Immature Granulocytes: 0.01 10*3/uL (ref 0.00–0.07)
Basophils Absolute: 0.1 10*3/uL (ref 0.0–0.1)
Basophils Relative: 1 %
Eosinophils Absolute: 0.1 10*3/uL (ref 0.0–0.5)
Eosinophils Relative: 2 %
HCT: 40.2 % (ref 39.0–52.0)
Hemoglobin: 13.9 g/dL (ref 13.0–17.0)
Immature Granulocytes: 0 %
Lymphocytes Relative: 16 %
Lymphs Abs: 0.8 10*3/uL (ref 0.7–4.0)
MCH: 31.8 pg (ref 26.0–34.0)
MCHC: 34.6 g/dL (ref 30.0–36.0)
MCV: 92 fL (ref 80.0–100.0)
Monocytes Absolute: 0.5 10*3/uL (ref 0.1–1.0)
Monocytes Relative: 10 %
Neutro Abs: 3.7 10*3/uL (ref 1.7–7.7)
Neutrophils Relative %: 71 %
Platelet Count: 145 10*3/uL — ABNORMAL LOW (ref 150–400)
RBC: 4.37 MIL/uL (ref 4.22–5.81)
RDW: 13 % (ref 11.5–15.5)
WBC Count: 5.1 10*3/uL (ref 4.0–10.5)
nRBC: 0 % (ref 0.0–0.2)

## 2022-02-26 LAB — TSH: TSH: 1.369 u[IU]/mL (ref 0.350–4.500)

## 2022-02-26 MED ORDER — SODIUM CHLORIDE 0.9 % IV SOLN
200.0000 mg | Freq: Once | INTRAVENOUS | Status: AC
  Administered 2022-02-26: 200 mg via INTRAVENOUS
  Filled 2022-02-26: qty 200

## 2022-02-26 MED ORDER — SODIUM CHLORIDE 0.9 % IV SOLN: Freq: Once | INTRAVENOUS | Status: AC

## 2022-02-26 NOTE — Patient Instructions (Signed)
[facility]  Discharge Instructions: Thank you for choosing [facility] to provide your oncology and hematology care.   If you have a lab appointment with the Cancer Center, please go directly to the Cancer Center and check in at the registration area.   Wear comfortable clothing and clothing appropriate for easy access to any Portacath or PICC line.   We strive to give you quality time with your provider. You may need to reschedule your appointment if you arrive late (15 or more minutes).  Arriving late affects you and other patients whose appointments are after yours.  Also, if you miss three or more appointments without notifying the office, you may be dismissed from the clinic at the provider's discretion.      For prescription refill requests, have your pharmacy contact our office and allow 72 hours for refills to be completed.    Today you received the following chemotherapy and/or immunotherapy agents: Keytruda.       To help prevent nausea and vomiting after your treatment, we encourage you to take your nausea medication as directed.  BELOW ARE SYMPTOMS THAT SHOULD BE REPORTED IMMEDIATELY: *FEVER GREATER THAN 100.4 F (38 C) OR HIGHER *CHILLS OR SWEATING *NAUSEA AND VOMITING THAT IS NOT CONTROLLED WITH YOUR NAUSEA MEDICATION *UNUSUAL SHORTNESS OF BREATH *UNUSUAL BRUISING OR BLEEDING *URINARY PROBLEMS (pain or burning when urinating, or frequent urination) *BOWEL PROBLEMS (unusual diarrhea, constipation, pain near the anus) TENDERNESS IN MOUTH AND THROAT WITH OR WITHOUT PRESENCE OF ULCERS (sore throat, sores in mouth, or a toothache) UNUSUAL RASH, SWELLING OR PAIN  UNUSUAL VAGINAL DISCHARGE OR ITCHING   Items with * indicate a potential emergency and should be followed up as soon as possible or go to the Emergency Department if any problems should occur.  Please show the CHEMOTHERAPY ALERT CARD or IMMUNOTHERAPY ALERT CARD at check-in to  the Emergency Department and triage nurse.  Should you have questions after your visit or need to cancel or reschedule your appointment, please contact [facility]  Dept: [phone]  and follow the prompts.  Office hours are 8:00 a.m. to 4:30 p.m. Monday - Friday. Please note that voicemails left after 4:00 p.m. may not be returned until the following business day.  We are closed weekends and major holidays. You have access to a nurse at all times for urgent questions. Please call the main number to the clinic Dept: [phone] and follow the prompts.   For any non-urgent questions, you may also contact your provider using MyChart. We now offer e-Visits for anyone 18 and older to request care online for non-urgent symptoms. For details visit [email or web address].   Also download the MyChart app! Go to the app store, search "MyChart", open the app, select [place], and log in with your MyChart username and password.  Masks are optional in the cancer centers. If you would like for your care team to wear a mask while they are taking care of you, please let them know. You may have one support Lynzi Meulemans who is at least 79 years old accompany you for your appointments. 

## 2022-02-26 NOTE — Progress Notes (Signed)
Per Dr. Alen Blew, okay to treat with Creatinine 1.52

## 2022-02-26 NOTE — Progress Notes (Signed)
Hematology and Oncology Follow Up Visit  Micheal Mcintyre [id number] [birth year] 81 y.o. 02/26/2022 11:35 AM Micheal Mcintyre, MDShadad, [name], MD   Principle Diagnosis: 68 year old with T3a papillary renal cell carcinoma rhabdoid features diagnosed in May 2023.  Prior therapy: He is status post right open radical nephrectomy completed on October 15, 2021.  Current therapy: Pembrolizumab 200 mg every 3 weeks started on December 03, 2021.  He is here for cycle 5 of therapy.  Interim History: [name] presents today for a follow-up.  Since the last visit, he reports no major changes in his health.  He continues to tolerate current treatment without any complications.  He denies any nausea, vomiting or abdominal pain.  He denies any hospitalizations or illnesses.  He denies any shortness of breath or difficulty breathing.  Pulm status quality of life remains unchanged.     Medications: Reviewed without changes. Current Outpatient Medications  Medication Sig Dispense Refill   acetaminophen (TYLENOL) 500 MG tablet Take 1,000 mg by mouth every 6 (six) hours as needed for moderate pain.     aluminum hydroxide-magnesium carbonate (GAVISCON) 95-358 MG/15ML SUSP Take 15 mLs by mouth as needed for heartburn or indigestion.     Cholecalciferol (VITAMIN D3) 10 MCG (400 UNIT) tablet Take 800 Units by mouth daily.     diclofenac Sodium (VOLTAREN) 1 % GEL Apply 1 application. topically 4 (four) times daily as needed (pain).     docusate sodium (COLACE) 100 MG capsule Take 1 capsule (100 mg total) by mouth daily as needed for up to 30 doses. 30 capsule 0   ferrous sulfate 325 (65 FE) MG tablet Take 325 mg by mouth 4 (four) times a week.     ibuprofen (ADVIL) 200 MG tablet Take 200 mg by mouth daily.     Multiple Vitamin (MULTIVITAMIN WITH MINERALS) TABS tablet Take 1 tablet by mouth 4 (four) times a week.     Omega-3 350 MG CPDR Take 350 mg by mouth daily.     oxyCODONE-acetaminophen (PERCOCET) 5-325 MG tablet  Take 1 tablet by mouth every 4 (four) hours as needed for up to 30 doses for severe pain. 30 tablet 0   prochlorperazine (COMPAZINE) 10 MG tablet Take 1 tablet (10 mg total) by mouth every 6 (six) hours as needed for nausea or vomiting. 30 tablet 0   tamsulosin (FLOMAX) 0.4 MG CAPS capsule Take 1 capsule (0.4 mg total) by mouth daily after supper. 90 capsule 3   telmisartan (MICARDIS) 80 MG tablet Take 0.5 tablets (40 mg total) by mouth 2 (two) times daily. 180 tablet 1   TURMERIC PO Take 1 tablet by mouth 3 (three) times a week.     No current facility-administered medications for this visit.     Allergies: No Known Allergies    Physical Exam:  Blood pressure 137/68, pulse 68, temperature 97.6 F (36.4 C), temperature source Temporal, resp. rate 17, height '5\' 9"'$  (1.753 m), weight 165 lb 12.8 oz (75.2 kg), SpO2 98 %.   ECOG: 1   General appearance: Alert, awake without any distress. Head: Atraumatic without abnormalities Oropharynx: Without any thrush or ulcers. Eyes: No scleral icterus. Lymph nodes: No lymphadenopathy noted in the cervical, supraclavicular, or axillary nodes Heart:regular rate and rhythm, without any murmurs or gallops.   Lung: Clear to auscultation without any rhonchi, wheezes or dullness to percussion. Abdomin: Soft, nontender without any shifting dullness or ascites. Musculoskeletal: No clubbing or cyanosis. Neurological: No motor or sensory deficits. Skin:  No rashes or lesions.      Lab Results: Lab Results  Component Value Date   WBC 5.3 02/05/2022   HGB 13.8 02/05/2022   HCT 40.7 02/05/2022   MCV 91.9 02/05/2022   PLT 151 02/05/2022   PSA 0.84 03/20/2021     Chemistry      Component Value Date/Time   NA 138 02/05/2022 1136   K 4.9 02/05/2022 1136   CL 106 02/05/2022 1136   CO2 26 02/05/2022 1136   BUN 31 (H) 02/05/2022 1136   CREATININE 1.71 (H) 02/05/2022 1136   CREATININE 1.09 02/06/2020 1306      Component Value Date/Time    CALCIUM 9.5 02/05/2022 1136   ALKPHOS 78 02/05/2022 1136   AST 15 02/05/2022 1136   ALT 15 02/05/2022 1136   BILITOT 0.8 02/05/2022 1136        Impression and Plan:    73 year old with:  1.  Kidney cancer diagnosed in May 2023.  He was found to have T3a papillary tumor with rhabdoid features.    The natural course of his disease was reviewed and risks and benefits of continuing Pembrolizumab were discussed.  Complication associated with this treatment that include nausea, fatigue, and autoimmune complications.  He is agreeable to proceed.  Today he had on February 03, 2022 which showed no evidence of recurrent disease.  He will continue to follow with urology for future imaging studies.   2.  IV access: Peripheral veins are currently in use without any issues.   3.  Antiemetics: Compazine is available to him without any nausea or vomiting.   4.  Autoimmune complications: These issues include pneumonitis, colitis and thyroid disease were reiterated.  5.  Follow-up: In 3 weeks for a follow-up.   30  minutes were dedicated to this visit.  The time was spent on updating disease status, treatment choices and outlining future plan of care review.     Zola Button, MD 10/26/202311:35 AM

## 2022-03-02 ENCOUNTER — Other Ambulatory Visit: Payer: Self-pay

## 2022-03-03 ENCOUNTER — Other Ambulatory Visit: Payer: Self-pay

## 2022-03-04 ENCOUNTER — Ambulatory Visit (INDEPENDENT_AMBULATORY_CARE_PROVIDER_SITE_OTHER): Payer: Medicare Other | Admitting: *Deleted

## 2022-03-04 DIAGNOSIS — Z0000 Encounter for general adult medical examination without abnormal findings: Secondary | ICD-10-CM

## 2022-03-04 NOTE — Progress Notes (Signed)
Subjective:   Micheal Mcintyre is a 81 y.o. male who presents for Medicare Annual/Subsequent preventive examination.  I connected with  Micheal Mcintyre on 03/04/22 by a audio enabled telemedicine application and verified that I am speaking with the correct person using two identifiers.  Patient Location: Home  Provider Location: Office/Clinic  I discussed the limitations of evaluation and management by telemedicine. The patient expressed understanding and agreed to proceed.   Review of Systems    Defer to PCP Cardiac Risk Factors include: advanced age (>64mn, >>54women);dyslipidemia;male gender;hypertension     Objective:    There were no vitals filed for this visit. There is no height or weight on file to calculate BMI.     03/04/2022    9:09 AM 10/15/2021    7:05 AM 10/02/2021   11:21 AM 09/01/2021    4:19 AM 01/08/2021    8:27 AM 02/06/2020    3:32 PM 12/26/2019    8:52 AM  Advanced Directives  Does Patient Have a Medical Advance Directive? Yes Yes Yes Yes Yes Yes Yes  Type of AParamedicof [place]ok[place]Living will [place]Living will HM[place]Living will  [place]Living will H[place]Living will [place]Living will  Does patient want to make changes to medical advance directive? No - Patient declined No - Patient declined     No - Patient declined  Copy of HZemplein Chart? Yes - validated most recent copy scanned in chart (See row information) No - copy requested   Yes - validated most recent copy scanned in chart (See row information)  No - copy requested    Current Medications (verified) Outpatient Encounter Medications as of 03/04/2022  Medication Sig   acetaminophen (TYLENOL) 500 MG tablet Take 1,000 mg by mouth every 6 (six) hours as needed for moderate pain.   aluminum hydroxide-magnesium carbonate (GAVISCON) 95-358 MG/15ML SUSP Take 15  mLs by mouth as needed for heartburn or indigestion.   Cholecalciferol (VITAMIN D3) 10 MCG (400 UNIT) tablet Take 800 Units by mouth daily.   diclofenac Sodium (VOLTAREN) 1 % GEL Apply 1 application. topically 4 (four) times daily as needed (pain).   docusate sodium (COLACE) 100 MG capsule Take 1 capsule (100 mg total) by mouth daily as needed for up to 30 doses.   ferrous sulfate 325 (65 FE) MG tablet Take 325 mg by mouth 4 (four) times a week.   ibuprofen (ADVIL) 200 MG tablet Take 200 mg by mouth daily.   Multiple Vitamin (MULTIVITAMIN WITH MINERALS) TABS tablet Take 1 tablet by mouth 4 (four) times a week.   Omega-3 350 MG CPDR Take 350 mg by mouth daily.   oxyCODONE-acetaminophen (PERCOCET) 5-325 MG tablet Take 1 tablet by mouth every 4 (four) hours as needed for up to 30 doses for severe pain.   prochlorperazine (COMPAZINE) 10 MG tablet Take 1 tablet (10 mg total) by mouth every 6 (six) hours as needed for nausea or vomiting.   tamsulosin (FLOMAX) 0.4 MG CAPS capsule Take 1 capsule (0.4 mg total) by mouth daily after supper.   telmisartan (MICARDIS) 80 MG tablet Take 0.5 tablets (40 mg total) by mouth 2 (two) times daily.   TURMERIC PO Take 1 tablet by mouth 3 (three) times a week.   No facility-administered encounter medications on file as of 03/04/2022.    Allergies (verified) Patient has no known allergies.   History: Past  Medical History:  Diagnosis Date   Arthritis    GERD (gastroesophageal reflux disease)    Hypertension    Medicare annual wellness visit, subsequent 08/19/2014   Osteoarthritis 09/21/2007   Qualifier: Diagnosis of  By: Niel Hummer MD, Izell LaSalle R    PONV (postoperative nausea and vomiting)    Preventative health care 06/07/2016   Right shoulder pain 12/03/2016   Past Surgical History:  Procedure Laterality Date   CHOLECYSTECTOMY     HIP SURGERY     JOINT REPLACEMENT     NEPHRECTOMY Right 10/15/2021   Procedure: NEPHRECTOMY, OPEN;  Surgeon: Janith Lima,  MD;  Location: [facility];  Service: Urology;  Laterality: Right;   nose skin cancer removal     TOTAL HIP ARTHROPLASTY     Family History  Problem Relation Age of Onset   Heart attack Father 57       deceased   Diabetes Maternal Uncle        maternal grandfather   Hypertension Neg Hx    Breast cancer Neg Hx    Colon cancer Neg Hx    Prostate cancer Neg Hx    Social History   Socioeconomic History   Marital status: Married    Spouse name: Not on file   Number of children: Not on file   Years of education: Not on file   Highest education level: Not on file  Occupational History   Not on file  Tobacco Use   Smoking status: Never   Smokeless tobacco: Never  Vaping Use   Vaping Use: Never used  Substance and Sexual Activity   Alcohol use: Yes    Comment: occ   Drug use: No   Sexual activity: Not Currently  Other Topics Concern   Not on file  Social History Narrative   Not on file   Social Determinants of Health   Financial Resource Strain: Low Risk  (01/08/2021)   Overall Financial Resource Strain (CARDIA)    Difficulty of Paying Living Expenses: Not hard at all  Food Insecurity: No Food Insecurity (01/08/2021)   Hunger Vital Sign    Worried About Running Out of Food in the Last Year: Never true    [place] in the Last Year: Never true  Transportation Needs: No Transportation Needs (01/08/2021)   PRAPARE - Hydrologist (Medical): No    Lack of Transportation (Non-Medical): No  Physical Activity: Sufficiently Active (01/08/2021)   Exercise Vital Sign    Days of Exercise per Week: 5 days    Minutes of Exercise per Session: 30 min  Stress: No Stress Concern Present (01/08/2021)   [place]    Feeling of Stress : Not at all  Social Connections: Moderately Integrated (01/08/2021)   Social Connection and Isolation Panel [NHANES]    Frequency of Communication with Friends and  Family: More than three times a week    Frequency of Social Gatherings with Friends and Family: More than three times a week    Attends Religious Services: More than 4 times per year    Active Member of [employer] or Organizations: No    Attends Archivist Meetings: Never    Marital Status: Married    Tobacco Counseling Counseling given: Not Answered   Clinical Intake:  Pre-visit preparation completed: Yes  Pain : No/denies pain     Diabetes: No  How often do you need to have someone  help you when you read instructions, pamphlets, or other written materials from your doctor or pharmacy?: 1 - Never   Activities of Daily Living    03/04/2022    9:13 AM 10/16/2021    9:30 AM  In your present state of health, do you have any difficulty performing the following activities:  Hearing? 0 0  Vision? 0 0  Difficulty concentrating or making decisions? 0 0  Walking or climbing stairs? 0 0  Dressing or bathing? 0 0  Doing errands, shopping? 0 0  Preparing Food and eating ? N   Using the Toilet? N   In the past six months, have you accidently leaked urine? Y   Do you have problems with loss of bowel control? N   Managing your Medications? N   Managing your Finances? N   Housekeeping or managing your Housekeeping? N     Patient Care Team: [name], MD as PCP - General (Family Medicine)  Indicate any recent Medical Services you may have received from other than [facility] providers in the past year (date may be approximate).     Assessment:   This is a routine wellness examination for [facility].  Hearing/Vision screen No results found.  Dietary issues and exercise activities discussed: Current Exercise Habits: Home exercise routine, Type of exercise: Other - see comments;stretching (swims), Time (Minutes): 30, Frequency (Times/Week): 3, Weekly Exercise (Minutes/Week): 90, Intensity: Mild, Exercise limited by: orthopedic condition(s)   Goals Addressed   None     Depression Screen    03/04/2022    9:12 AM 01/08/2021    8:30 AM 08/06/2020    9:49 AM 12/26/2019    8:58 AM 12/23/2018    9:10 AM 12/21/2017    1:12 PM 12/03/2016    8:58 AM  PHQ 2/9 Scores  PHQ - 2 Score 0 0 0 0 0 0 0    Fall Risk    03/04/2022    9:11 AM 01/08/2021    8:28 AM 08/06/2020    9:49 AM 12/26/2019    8:58 AM 12/23/2018    9:10 AM  [place] in the past year? 0 0 0 1 1  Number falls in past yr: 0 0  0 0  Injury with Fall? 0 0  0 0  Risk for fall due to : No Fall Risks      Follow up Falls evaluation completed Falls prevention discussed  Education provided;Falls prevention discussed Education provided;Falls prevention discussed    FALL RISK PREVENTION PERTAINING TO THE HOME:  Any stairs in or around the home? Yes  If so, are there any without handrails? No  Home free of loose throw rugs in walkways, pet beds, electrical cords, etc? Yes  Adequate lighting in your home to reduce risk of falls? Yes   ASSISTIVE DEVICES UTILIZED TO PREVENT FALLS:  Life alert? No  Use of a cane, walker or w/c? No  Grab bars in the bathroom? Yes  Shower chair or bench in shower? No  Elevated toilet seat or a handicapped toilet? Yes   TIMED UP AND GO:  Was the test performed?  No, audio visit .    Cognitive Function:    06/01/2016    2:10 PM  MMSE - Mini Mental State Exam  Orientation to time 5  Orientation to Place 5  Registration 3  Attention/ Calculation 5  Recall 3  Language- name 2 objects 2  Language- repeat 1  Language- follow 3 step command  3  Language- read & follow direction 1  Write a sentence 1  Copy design 1  Total score 30        03/04/2022    9:24 AM  6CIT Screen  What Year? 0 points  What month? 0 points  What time? 0 points  Count back from 20 0 points  Months in reverse 0 points  Repeat phrase 0 points  Total Score 0 points    Immunizations Immunization History  Administered Date(s) Administered   Fluad Quad(high Dose 65+) 02/16/2019,  02/06/2020, 03/20/2021   Influenza Whole 02/07/2008, 02/01/2009   Influenza, High Dose Seasonal PF 01/07/2018   Influenza,inj,Quad PF,6+ Mos 01/24/2013, 01/01/2014   Influenza-Unspecified 02/23/2015, 03/04/2016   PFIZER(Purple Top)SARS-COV-2 Vaccination 05/27/2019, 06/19/2019, 03/04/2020   Pneumococcal Conjugate-13 05/24/2013   Pneumococcal Polysaccharide-23 02/07/2008, 06/01/2016   Td 09/28/2007, 01/29/2018   Zoster Recombinat (Shingrix) 03/20/2018, 06/21/2018   Zoster, Live 10/19/2007, 05/02/2009    TDAP status: Up to date  Flu Vaccine status: Due, Education has been provided regarding the importance of this vaccine. Advised may receive this vaccine at local pharmacy or Health Dept. Aware to provide a copy of the vaccination record if obtained from local pharmacy or Health Dept. Verbalized acceptance and understanding.  Pneumococcal vaccine status: Up to date  Covid-19 vaccine status: Information provided on how to obtain vaccines.   Qualifies for Shingles Vaccine? Yes   Zostavax completed Yes   Shingrix Completed?: Yes  Screening Tests Health Maintenance  Topic Date Due   COVID-19 Vaccine (4 - Pfizer risk series) 04/29/2020   INFLUENZA VACCINE  12/02/2021   Medicare Annual Wellness (AWV)  01/08/2022   Hepatitis C Screening  08/07/2031 (Originally 03/25/1959)   TETANUS/TDAP  01/30/2028   Pneumonia Vaccine 71+ Years old  Completed   Zoster Vaccines- Shingrix  Completed   HPV VACCINES  Aged Out   COLONOSCOPY (Pts 45-69yr Insurance coverage will need to be confirmed)  Discontinued    Health Maintenance  Health Maintenance Due  Topic Date Due   COVID-19 Vaccine (4 - Pfizer risk series) 04/29/2020   INFLUENZA VACCINE  12/02/2021   Medicare Annual Wellness (AWV)  01/08/2022    Colorectal cancer screening: No longer required.   Lung Cancer Screening: (Low Dose CT Chest recommended if Age 79-80years, 30 pack-year currently smoking OR have quit w/in 15years.) does not  qualify.   Lung Cancer Screening Referral: N/a  Additional Screening:  Hepatitis C Screening: does qualify; Completed N/a  Vision Screening: Recommended annual ophthalmology exams for early detection of glaucoma and other disorders of the eye. Is the patient up to date with their annual eye exam?  Yes  Who is the provider or what is the name of the office in which the patient attends annual eye exams? Dr. MSabra HeckIf pt is not established with a provider, would they like to be referred to a provider to establish care? No .   Dental Screening: Recommended annual dental exams for proper oral hygiene  Community Resource Referral / Chronic Care Management: CRR required this visit?  No   CCM required this visit?  No      Plan:     I have personally reviewed and noted the following in the patient's chart:   Medical and social history Use of alcohol, tobacco or illicit drugs  Current medications and supplements including opioid prescriptions. Patient is currently taking opioid prescriptions. Information provided to patient regarding non-opioid alternatives. Patient advised to discuss non-opioid treatment plan with their provider. Functional  ability and status Nutritional status Physical activity Advanced directives List of other physicians Hospitalizations, surgeries, and ER visits in previous 12 months Vitals Screenings to include cognitive, depression, and falls Referrals and appointments  In addition, I have reviewed and discussed with patient certain preventive protocols, quality metrics, and best practice recommendations. A written personalized care plan for preventive services as well as general preventive health recommendations were provided to patient.   Due to this being a telephonic visit, the after visit summary with patients personalized plan was offered to patient via mail or my-chart. Patient would like to access on my-chart.  Beatris Ship, [place]   03/04/2022   Nurse  Notes: None

## 2022-03-04 NOTE — Patient Instructions (Signed)
Micheal Mcintyre , Thank you for taking time to come for your Medicare Wellness Visit. I appreciate your ongoing commitment to your health goals. Please review the following plan we discussed and let me know if I can assist you in the future.   These are the goals we discussed:  Goals      Exercise 150 min/wk Moderate Activity     Continue swimming at the [place].        This is a list of the screening recommended for you and due dates:  Health Maintenance  Topic Date Due   COVID-19 Vaccine (4 - Pfizer risk series) 04/29/2020   Flu Shot  12/02/2021   Hepatitis C Screening: USPSTF Recommendation to screen - Ages 18-79 yo.  08/07/2031*   Medicare Annual Wellness Visit  03/05/2023   Tetanus Vaccine  01/30/2028   Pneumonia Vaccine  Completed   Zoster (Shingles) Vaccine  Completed   HPV Vaccine  Aged Out   Colon Cancer Screening  Discontinued  *Topic was postponed. The date shown is not the original due date.     Next appointment: Follow up in one year for your annual wellness visit.   Preventive Care 79 Years and Older, Male Preventive care refers to lifestyle choices and visits with your health care provider that can promote health and wellness. What does preventive care include? A yearly physical exam. This is also called an annual well check. Dental exams once or twice a year. Routine eye exams. Ask your health care provider how often you should have your eyes checked. Personal lifestyle choices, including: Daily care of your teeth and gums. Regular physical activity. Eating a healthy diet. Avoiding tobacco and drug use. Limiting alcohol use. Practicing safe sex. Taking low doses of aspirin every day. Taking vitamin and mineral supplements as recommended by your health care provider. What happens during an annual well check? The services and screenings done by your health care provider during your annual well check will depend on your age, overall health, lifestyle risk  factors, and family history of disease. Counseling  Your health care provider may ask you questions about your: Alcohol use. Tobacco use. Drug use. Emotional well-being. Home and relationship well-being. Sexual activity. Eating habits. History of falls. Memory and ability to understand (cognition). Work and work Statistician. Screening  You may have the following tests or measurements: Height, weight, and BMI. Blood pressure. Lipid and cholesterol levels. These may be checked every 5 years, or more frequently if you are over 45 years old. Skin check. Lung cancer screening. You may have this screening every year starting at age 79 if you have a 30-pack-year history of smoking and currently smoke or have quit within the past 15 years. Fecal occult blood test (FOBT) of the stool. You may have this test every year starting at age 79. Flexible sigmoidoscopy or colonoscopy. You may have a sigmoidoscopy every 5 years or a colonoscopy every 10 years starting at age 79. Prostate cancer screening. Recommendations will vary depending on your family history and other risks. Hepatitis C blood test. Hepatitis B blood test. Sexually transmitted disease (STD) testing. Diabetes screening. This is done by checking your blood sugar (glucose) after you have not eaten for a while (fasting). You may have this done every 1-3 years. Abdominal aortic aneurysm (AAA) screening. You may need this if you are a current or former smoker. Osteoporosis. You may be screened starting at age 79 if you are at high risk. if you are at high risk. Talk with your health  care provider about your test results, treatment options, and if necessary, the need for more tests. Vaccines  Your health care provider may recommend certain vaccines, such as: Influenza vaccine. This is recommended every year. Tetanus, diphtheria, and acellular pertussis (Tdap, Td) vaccine. You may need a Td booster every 10 years. Zoster vaccine. You may need this after age  79. Pneumococcal 13-valent conjugate (PCV13) vaccine. One dose is recommended after age 79. Pneumococcal polysaccharide (PPSV23) vaccine. One dose is recommended after age 73. Talk to your health care provider about which screenings and vaccines you need and how often you need them. This information is not intended to replace advice given to you by your health care provider. Make sure you discuss any questions you have with your health care provider. Document Released: 05/17/2015 Document Revised: 01/08/2016 Document Reviewed: 02/19/2015 Elsevier Interactive Patient Education  2017 [employer] Prevention in the Home Falls can cause injuries. They can happen to people of all ages. There are many things you can do to make your home safe and to help prevent falls. What can I do on the outside of my home? Regularly fix the edges of walkways and driveways and fix any cracks. Remove anything that might make you trip as you walk through a door, such as a raised step or threshold. Trim any bushes or trees on the path to your home. Use bright outdoor lighting. Clear any walking paths of anything that might make someone trip, such as rocks or tools. Regularly check to see if handrails are loose or broken. Make sure that both sides of any steps have handrails. Any raised decks and porches should have guardrails on the edges. Have any leaves, snow, or ice cleared regularly. Use sand or salt on walking paths during winter. Clean up any spills in your garage right away. This includes oil or grease spills. What can I do in the bathroom? Use night lights. Install grab bars by the toilet and in the tub and shower. Do not use towel bars as grab bars. Use non-skid mats or decals in the tub or shower. If you need to sit down in the shower, use a plastic, non-slip stool. Keep the floor dry. Clean up any water that spills on the floor as soon as it happens. Remove soap buildup in the tub or shower  regularly. Attach bath mats securely with double-sided non-slip rug tape. Do not have throw rugs and other things on the floor that can make you trip. What can I do in the bedroom? Use night lights. Make sure that you have a light by your bed that is easy to reach. Do not use any sheets or blankets that are too big for your bed. They should not hang down onto the floor. Have a firm chair that has side arms. You can use this for support while you get dressed. Do not have throw rugs and other things on the floor that can make you trip. What can I do in the kitchen? Clean up any spills right away. Avoid walking on wet floors. Keep items that you use a lot in easy-to-reach places. If you need to reach something above you, use a strong step stool that has a grab bar. Keep electrical cords out of the way. Do not use floor polish or wax that makes floors slippery. If you must use wax, use non-skid floor wax. Do not have throw rugs and other things on the floor that can make you trip. What can  I do with my stairs? Do not leave any items on the stairs. Make sure that there are handrails on both sides of the stairs and use them. Fix handrails that are broken or loose. Make sure that handrails are as long as the stairways. Check any carpeting to make sure that it is firmly attached to the stairs. Fix any carpet that is loose or worn. Avoid having throw rugs at the top or bottom of the stairs. If you do have throw rugs, attach them to the floor with carpet tape. Make sure that you have a light switch at the top of the stairs and the bottom of the stairs. If you do not have them, ask someone to add them for you. What else can I do to help prevent falls? Wear shoes that: Do not have high heels. Have rubber bottoms. Are comfortable and fit you well. Are closed at the toe. Do not wear sandals. If you use a stepladder: Make sure that it is fully opened. Do not climb a closed stepladder. Make sure that  both sides of the stepladder are locked into place. Ask someone to hold it for you, if possible. Clearly mark and make sure that you can see: Any grab bars or handrails. First and last steps. Where the edge of each step is. Use tools that help you move around (mobility aids) if they are needed. These include: Canes. Walkers. Scooters. Crutches. Turn on the lights when you go into a dark area. Replace any light bulbs as soon as they burn out. Set up your furniture so you have a clear path. Avoid moving your furniture around. If any of your floors are uneven, fix them. If there are any pets around you, be aware of where they are. Review your medicines with your doctor. Some medicines can make you feel dizzy. This can increase your chance of falling. Ask your doctor what other things that you can do to help prevent falls. This information is not intended to replace advice given to you by your health care provider. Make sure you discuss any questions you have with your health care provider. Document Released: 02/14/2009 Document Revised: 09/26/2015 Document Reviewed: 05/25/2014 Elsevier Interactive Patient Education  2017 [employer].

## 2022-03-19 ENCOUNTER — Inpatient Hospital Stay: Payer: Medicare Other

## 2022-03-19 ENCOUNTER — Other Ambulatory Visit: Payer: Self-pay

## 2022-03-19 ENCOUNTER — Inpatient Hospital Stay (HOSPITAL_BASED_OUTPATIENT_CLINIC_OR_DEPARTMENT_OTHER): Payer: Medicare Other | Admitting: Oncology

## 2022-03-19 ENCOUNTER — Inpatient Hospital Stay: Payer: Medicare Other | Attending: Oncology

## 2022-03-19 VITALS — BP 134/73 | HR 72 | Temp 98.0°F | Resp 16 | Wt 162.9 lb

## 2022-03-19 VITALS — BP 136/76

## 2022-03-19 DIAGNOSIS — C641 Malignant neoplasm of right kidney, except renal pelvis: Secondary | ICD-10-CM

## 2022-03-19 DIAGNOSIS — C642 Malignant neoplasm of left kidney, except renal pelvis: Secondary | ICD-10-CM | POA: Insufficient documentation

## 2022-03-19 DIAGNOSIS — Z5112 Encounter for antineoplastic immunotherapy: Secondary | ICD-10-CM | POA: Insufficient documentation

## 2022-03-19 DIAGNOSIS — Z79899 Other long term (current) drug therapy: Secondary | ICD-10-CM | POA: Insufficient documentation

## 2022-03-19 DIAGNOSIS — Z791 Long term (current) use of non-steroidal anti-inflammatories (NSAID): Secondary | ICD-10-CM | POA: Insufficient documentation

## 2022-03-19 LAB — CMP (CANCER CENTER ONLY)
ALT: 15 U/L (ref 0–44)
AST: 15 U/L (ref 15–41)
Albumin: 4.4 g/dL (ref 3.5–5.0)
Alkaline Phosphatase: 87 U/L (ref 38–126)
Anion gap: 8 (ref 5–15)
BUN: 32 mg/dL — ABNORMAL HIGH (ref 8–23)
CO2: 25 mmol/L (ref 22–32)
Calcium: 9.6 mg/dL (ref 8.9–10.3)
Chloride: 106 mmol/L (ref 98–111)
Creatinine: 1.54 mg/dL — ABNORMAL HIGH (ref 0.61–1.24)
GFR, Estimated: 46 mL/min — ABNORMAL LOW (ref >60)
Glucose, Bld: 90 mg/dL (ref 70–99)
Potassium: 4.6 mmol/L (ref 3.5–5.1)
Sodium: 139 mmol/L (ref 135–145)
Total Bilirubin: 0.7 mg/dL (ref 0.3–1.2)
Total Protein: 7.3 g/dL (ref 6.5–8.1)

## 2022-03-19 LAB — CBC WITH DIFFERENTIAL (CANCER CENTER ONLY)
Abs Immature Granulocytes: 0.01 10*3/uL (ref 0.00–0.07)
Basophils Absolute: 0.1 10*3/uL (ref 0.0–0.1)
Basophils Relative: 1 %
Eosinophils Absolute: 0.1 10*3/uL (ref 0.0–0.5)
Eosinophils Relative: 3 %
HCT: 40.9 % (ref 39.0–52.0)
Hemoglobin: 13.6 g/dL (ref 13.0–17.0)
Immature Granulocytes: 0 %
Lymphocytes Relative: 18 %
Lymphs Abs: 0.9 10*3/uL (ref 0.7–4.0)
MCH: 31.2 pg (ref 26.0–34.0)
MCHC: 33.3 g/dL (ref 30.0–36.0)
MCV: 93.8 fL (ref 80.0–100.0)
Monocytes Absolute: 0.4 10*3/uL (ref 0.1–1.0)
Monocytes Relative: 9 %
Neutro Abs: 3.4 10*3/uL (ref 1.7–7.7)
Neutrophils Relative %: 69 %
Platelet Count: 156 10*3/uL (ref 150–400)
RBC: 4.36 MIL/uL (ref 4.22–5.81)
RDW: 13.1 % (ref 11.5–15.5)
WBC Count: 5 10*3/uL (ref 4.0–10.5)
nRBC: 0 % (ref 0.0–0.2)

## 2022-03-19 LAB — TSH: TSH: 1.206 u[IU]/mL (ref 0.350–4.500)

## 2022-03-19 MED ORDER — SODIUM CHLORIDE 0.9 % IV SOLN
200.0000 mg | Freq: Once | INTRAVENOUS | Status: AC
  Administered 2022-03-19: 200 mg via INTRAVENOUS
  Filled 2022-03-19: qty 200

## 2022-03-19 MED ORDER — SODIUM CHLORIDE 0.9 % IV SOLN: Freq: Once | INTRAVENOUS | Status: AC

## 2022-03-19 NOTE — Patient Instructions (Signed)
[facility]  Discharge Instructions: Thank you for choosing [facility] to provide your oncology and hematology care.   If you have a lab appointment with the Cancer Center, please go directly to the Cancer Center and check in at the registration area.   Wear comfortable clothing and clothing appropriate for easy access to any Portacath or PICC line.   We strive to give you quality time with your provider. You may need to reschedule your appointment if you arrive late (15 or more minutes).  Arriving late affects you and other patients whose appointments are after yours.  Also, if you miss three or more appointments without notifying the office, you may be dismissed from the clinic at the provider's discretion.      For prescription refill requests, have your pharmacy contact our office and allow 72 hours for refills to be completed.    Today you received the following chemotherapy and/or immunotherapy agents: Keytruda.       To help prevent nausea and vomiting after your treatment, we encourage you to take your nausea medication as directed.  BELOW ARE SYMPTOMS THAT SHOULD BE REPORTED IMMEDIATELY: *FEVER GREATER THAN 100.4 F (38 C) OR HIGHER *CHILLS OR SWEATING *NAUSEA AND VOMITING THAT IS NOT CONTROLLED WITH YOUR NAUSEA MEDICATION *UNUSUAL SHORTNESS OF BREATH *UNUSUAL BRUISING OR BLEEDING *URINARY PROBLEMS (pain or burning when urinating, or frequent urination) *BOWEL PROBLEMS (unusual diarrhea, constipation, pain near the anus) TENDERNESS IN MOUTH AND THROAT WITH OR WITHOUT PRESENCE OF ULCERS (sore throat, sores in mouth, or a toothache) UNUSUAL RASH, SWELLING OR PAIN  UNUSUAL VAGINAL DISCHARGE OR ITCHING   Items with * indicate a potential emergency and should be followed up as soon as possible or go to the Emergency Department if any problems should occur.  Please show the CHEMOTHERAPY ALERT CARD or IMMUNOTHERAPY ALERT CARD at check-in to  the Emergency Department and triage nurse.  Should you have questions after your visit or need to cancel or reschedule your appointment, please contact [facility]  Dept: [phone]  and follow the prompts.  Office hours are 8:00 a.m. to 4:30 p.m. Monday - Friday. Please note that voicemails left after 4:00 p.m. may not be returned until the following business day.  We are closed weekends and major holidays. You have access to a nurse at all times for urgent questions. Please call the main number to the clinic Dept: [phone] and follow the prompts.   For any non-urgent questions, you may also contact your provider using MyChart. We now offer e-Visits for anyone 18 and older to request care online for non-urgent symptoms. For details visit [email or web address].   Also download the MyChart app! Go to the app store, search "MyChart", open the app, select , and log in with your MyChart username and password.  Masks are optional in the cancer centers. If you would like for your care team to wear a mask while they are taking care of you, please let them know. You may have one support person who is at least 79 years old accompany you for your appointments. 

## 2022-03-19 NOTE — Progress Notes (Signed)
Hematology and Oncology Follow Up Visit  Micheal Mcintyre [id number] 12-Aug-1940 81 y.o. 03/19/2022 10:10 AM Micheal Mcintyre, MDShadad, Mcintyre Dad, MD   Principle Diagnosis: 68 year old with kidney cancer diagnosed in May 2023.  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    Prior therapy: He is status post right open radical nephrectomy completed on October 15, 2021.  Current therapy: Pembrolizumab 200 mg every 3 weeks started on December 03, 2021.  He is here for cycle 6 of therapy.  Interim History: [name] returns today for a follow-up.  Since last visit, he reports no major injuries in his health.  He denies any nausea vomiting or abdominal pain.  He denies any hospitalizations or illnesses.  He continues to tolerate Pembrolizumab without any complications.  He denies any skin rash, lesion or shortness of breath.  He does report some mild pruritus.     Medications: Updated on review. Current Outpatient Medications  Medication Sig Dispense Refill   acetaminophen (TYLENOL) 500 MG tablet Take 1,000 mg by mouth every 6 (six) hours as needed for moderate pain.     aluminum hydroxide-magnesium carbonate (GAVISCON) 95-358 MG/15ML SUSP Take 15 mLs by mouth as needed for heartburn or indigestion.     Cholecalciferol (VITAMIN D3) 10 MCG (400 UNIT) tablet Take 800 Units by mouth daily.     diclofenac Sodium (VOLTAREN) 1 % GEL Apply 1 application. topically 4 (four) times daily as needed (pain).     docusate sodium (COLACE) 100 MG capsule Take 1 capsule (100 mg total) by mouth daily as needed for up to 30 doses. 30 capsule 0   ferrous sulfate 325 (65 FE) MG tablet Take 325 mg by mouth 4 (four) times a week.     ibuprofen (ADVIL) 200 MG tablet Take 200 mg by mouth daily.     Multiple Vitamin (MULTIVITAMIN WITH MINERALS) TABS tablet Take 1 tablet by mouth 4 (four) times a week.     Omega-3 350 MG CPDR Take 350 mg by mouth daily.     oxyCODONE-acetaminophen (PERCOCET)  5-325 MG tablet Take 1 tablet by mouth every 4 (four) hours as needed for up to 30 doses for severe pain. 30 tablet 0   prochlorperazine (COMPAZINE) 10 MG tablet Take 1 tablet (10 mg total) by mouth every 6 (six) hours as needed for nausea or vomiting. 30 tablet 0   tamsulosin (FLOMAX) 0.4 MG CAPS capsule Take 1 capsule (0.4 mg total) by mouth daily after supper. 90 capsule 3   telmisartan (MICARDIS) 80 MG tablet Take 0.5 tablets (40 mg total) by mouth 2 (two) times daily. 180 tablet 1   TURMERIC PO Take 1 tablet by mouth 3 (three) times a week.     No current facility-administered medications for this visit.     Allergies: No Known Allergies    Physical Exam:   Blood pressure 134/73, pulse 72, temperature 98 F (36.7 C), temperature source Temporal, resp. rate 16, weight 162 lb 14.4 oz (73.9 kg), SpO2 98 %.   ECOG: 1   General appearance: Comfortable appearing without any discomfort Head: Normocephalic without any trauma Oropharynx: Mucous membranes are moist and pink without any thrush or ulcers. Eyes: Pupils are equal and round reactive to light. Lymph nodes: No cervical, supraclavicular, inguinal or axillary lymphadenopathy.   Heart:regular rate and rhythm.  S1 and S2 without leg edema. Lung: Clear without any rhonchi or wheezes.  No dullness to percussion. Abdomin: Soft, nontender, nondistended with  good bowel sounds.  No hepatosplenomegaly. Musculoskeletal: No joint deformity or effusion.  Full range of motion noted. Neurological: No deficits noted on motor, sensory and deep tendon reflex exam. Skin: No petechial rash or dryness.  Appeared moist.        Lab Results: Lab Results  Component Value Date   WBC 5.1 02/26/2022   HGB 13.9 02/26/2022   HCT 40.2 02/26/2022   MCV 92.0 02/26/2022   PLT 145 (L) 02/26/2022   PSA 0.84 03/20/2021     Chemistry      Component Value Date/Time   NA 138 02/26/2022 1137   K 4.6 02/26/2022 1137   CL 106 02/26/2022 1137   CO2  25 02/26/2022 1137   BUN 24 (H) 02/26/2022 1137   CREATININE 1.52 (H) 02/26/2022 1137   CREATININE 1.09 02/06/2020 1306      Component Value Date/Time   CALCIUM 9.5 02/26/2022 1137   ALKPHOS 92 02/26/2022 1137   AST 15 02/26/2022 1137   ALT 16 02/26/2022 1137   BILITOT 0.7 02/26/2022 1137        Impression and Plan:    45 year old with:  1.  Kidney cancer presented with T3a papillary tumor with rhabdoid features in May 2023.   He continues to receive adjuvant Pembrolizumab without any complications.  Risks and benefits of continuing this treatment were reviewed at this time.  Potential complications that include autoimmune considerations, skin rash and GI toxicity were reiterated.  MRI in October 2023 did not show any evidence of recurrent disease.  After discussion today, he is agreeable to continue.    2.  IV access: No issues noted related to his peripheral veins.  Port-A-Cath option has been deferred.   3.  Antiemetics: No nausea or vomiting reported at this time.  Compazine is available to him.   4.  Autoimmune complications: Potential complication clinic pneumonitis, colitis and thyroid disease were reiterated.  He has not experienced any and I continue to educate him about these issues.  5.  Follow-up: In 3 weeks for a follow-up.   30  minutes were spent on this encounter.  The time was dedicated to updating his disease status, treatment choices and outlining future plan of care review.     Zola Button, MD 11/16/202310:10 AM

## 2022-03-20 LAB — T4: T4, Total: 5.8 ug/dL (ref 4.5–12.0)

## 2022-03-21 ENCOUNTER — Other Ambulatory Visit: Payer: Self-pay

## 2022-03-23 ENCOUNTER — Other Ambulatory Visit: Payer: Self-pay

## 2022-03-30 DIAGNOSIS — N189 Chronic kidney disease, unspecified: Secondary | ICD-10-CM | POA: Insufficient documentation

## 2022-03-30 NOTE — Assessment & Plan Note (Signed)
Supplement and monitor 

## 2022-03-30 NOTE — Assessment & Plan Note (Signed)
Encourage heart healthy diet such as MIND or DASH diet, increase exercise, avoid trans fats, simple carbohydrates and processed foods, consider a krill or fish or flaxseed oil cap daily.  °

## 2022-03-30 NOTE — Assessment & Plan Note (Signed)
Hydrate and monitor 

## 2022-03-30 NOTE — Assessment & Plan Note (Signed)
hgba1c acceptable, minimize simple carbs. Increase exercise as tolerated.  

## 2022-03-30 NOTE — Assessment & Plan Note (Signed)
Well controlled, no changes to meds. Encouraged heart healthy diet such as the DASH diet and exercise as tolerated.  °

## 2022-03-31 ENCOUNTER — Ambulatory Visit (INDEPENDENT_AMBULATORY_CARE_PROVIDER_SITE_OTHER): Payer: Medicare Other | Admitting: Family Medicine

## 2022-03-31 ENCOUNTER — Other Ambulatory Visit (HOSPITAL_BASED_OUTPATIENT_CLINIC_OR_DEPARTMENT_OTHER): Payer: Self-pay

## 2022-03-31 VITALS — BP 128/76 | HR 67 | Temp 97.5°F | Resp 16 | Ht 71.0 in | Wt 164.0 lb

## 2022-03-31 DIAGNOSIS — C641 Malignant neoplasm of right kidney, except renal pelvis: Secondary | ICD-10-CM

## 2022-03-31 DIAGNOSIS — E559 Vitamin D deficiency, unspecified: Secondary | ICD-10-CM

## 2022-03-31 DIAGNOSIS — N189 Chronic kidney disease, unspecified: Secondary | ICD-10-CM

## 2022-03-31 DIAGNOSIS — M47816 Spondylosis without myelopathy or radiculopathy, lumbar region: Secondary | ICD-10-CM

## 2022-03-31 DIAGNOSIS — I10 Essential (primary) hypertension: Secondary | ICD-10-CM | POA: Diagnosis not present

## 2022-03-31 DIAGNOSIS — R739 Hyperglycemia, unspecified: Secondary | ICD-10-CM | POA: Diagnosis not present

## 2022-03-31 DIAGNOSIS — R252 Cramp and spasm: Secondary | ICD-10-CM

## 2022-03-31 DIAGNOSIS — G5602 Carpal tunnel syndrome, left upper limb: Secondary | ICD-10-CM | POA: Insufficient documentation

## 2022-03-31 DIAGNOSIS — E782 Mixed hyperlipidemia: Secondary | ICD-10-CM | POA: Diagnosis not present

## 2022-03-31 DIAGNOSIS — M25532 Pain in left wrist: Secondary | ICD-10-CM

## 2022-03-31 LAB — COMPREHENSIVE METABOLIC PANEL
ALT: 20 U/L (ref 0–53)
AST: 18 U/L (ref 0–37)
Albumin: 4.4 g/dL (ref 3.5–5.2)
Alkaline Phosphatase: 87 U/L (ref 39–117)
BUN: 26 mg/dL — ABNORMAL HIGH (ref 6–23)
CO2: 28 mEq/L (ref 19–32)
Calcium: 9.5 mg/dL (ref 8.4–10.5)
Chloride: 105 mEq/L (ref 96–112)
Creatinine, Ser: 1.55 mg/dL — ABNORMAL HIGH (ref 0.40–1.50)
GFR: 42.45 mL/min — ABNORMAL LOW (ref >60.00)
Glucose, Bld: 98 mg/dL (ref 70–99)
Potassium: 4.8 mEq/L (ref 3.5–5.1)
Sodium: 141 mEq/L (ref 135–145)
Total Bilirubin: 0.8 mg/dL (ref 0.2–1.2)
Total Protein: 7 g/dL (ref 6.0–8.3)

## 2022-03-31 LAB — HEMOGLOBIN A1C: Hgb A1c MFr Bld: 6.4 % (ref 4.6–6.5)

## 2022-03-31 LAB — LIPID PANEL
Cholesterol: 192 mg/dL (ref 0–200)
HDL: 54.8 mg/dL (ref >39.00)
LDL Cholesterol: 104 mg/dL — ABNORMAL HIGH (ref 0–99)
NonHDL: 137.22
Total CHOL/HDL Ratio: 4
Triglycerides: 164 mg/dL — ABNORMAL HIGH (ref 0.0–149.0)
VLDL: 32.8 mg/dL (ref 0.0–40.0)

## 2022-03-31 MED ORDER — AREXVY 120 MCG/0.5ML IM SUSR
INTRAMUSCULAR | 0 refills | Status: DC
  Filled 2022-03-31: qty 0.5, 1d supply, fill #0

## 2022-03-31 NOTE — Assessment & Plan Note (Signed)
Is following with urology, oncology and is tolerating treatments

## 2022-03-31 NOTE — Progress Notes (Signed)
Subjective:   By signing my name below, I, Kellie Simmering, attest that this documentation has been prepared under the direction and in the presence of [name], MD., 03/31/2022.    Patient ID: Micheal Mcintyre, male    DOB: May 10, 1940, 81 y.o.   MRN: [id number]  No chief complaint on file.  HPI Patient is in today for an office visit. Patient denies CP/ palp/ SOB/ HA/ congestion/fevers/GI or GU c/o.  Back Pain Patient complains of chronic back pain that worsens from prolonged sitting and walking. He uses an assisted walking device. He is currently being seen by neurosurgeon Dr. 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 The pain radiates down both legs and causes numbness/tingling. He was seen by sports medicine specialist, Dr. Raeford Razor, on 08/26/2021 and is interested in seeing him again. He takes up to 6 Tylenol 500 mg nightly.    Cholesterol Patient has an intolerance to cholesterol medications, experiencing muscle cramps when taking Rosuvastatin 5 mg. He has discontinued this medication.   Left Wrist Pain Patient complains of left wrist pain that originates in the back of the wrist and radiates to the thumb. He is wearing a wrist brace today and states that he also wears it when sleeping. He has been applying Voltaren 1% gel which provides temporary relief. His nephrologist has told him not to take NSAIDs.  Malignant Neoplasm (Right Kidney) Patient is currently being seen by Dr. Alen Blew to manage the malignant neoplasm of the right kidney. Dr. Alen Blew has been administering Pembrolizumab 200 mg every 3 weeks and he  completed his 6th cycle of therapy on 03/19/2022. He has been tolerating treatment well. He underwent a right open radical nephrectomy on 10/15/2021. He has been attempting to reduce his salt intake and maintain a balanced diet.  Past Medical History:  Diagnosis Date   Arthritis    GERD (gastroesophageal reflux disease)    Hypertension     Medicare annual wellness visit, subsequent 08/19/2014   Osteoarthritis 09/21/2007   Qualifier: Diagnosis of  By: Niel Hummer MD, Izell [place] R    PONV (postoperative nausea and vomiting)    Preventative health care 06/07/2016   Right shoulder pain 12/03/2016   Past Surgical History:  Procedure Laterality Date   CHOLECYSTECTOMY     HIP SURGERY     JOINT REPLACEMENT     NEPHRECTOMY Right 10/15/2021   Procedure: NEPHRECTOMY, OPEN;  Surgeon: Janith Lima, MD;  Location: [facility];  Service: Urology;  Laterality: Right;   nose skin cancer removal     TOTAL HIP ARTHROPLASTY     Family History  Problem Relation Age of Onset   Heart attack Father 3       deceased   Diabetes Maternal Uncle        maternal grandfather   Hypertension Neg Hx    Breast cancer Neg Hx    Colon cancer Neg Hx    Prostate cancer Neg Hx    Social History   Socioeconomic History   Marital status: Married    Spouse name: Not on file   Number of children: Not on file   Years of education: Not on file   Highest education level: Not on file  Occupational History   Not on file  Tobacco Use   Smoking status: Never   Smokeless tobacco: Never  Vaping Use   Vaping Use: Never used  Substance and Sexual Activity   Alcohol use: Yes    Comment:  occ   Drug use: No   Sexual activity: Not Currently  Other Topics Concern   Not on file  Social History Narrative   Not on file   Social Determinants of Health   Financial Resource Strain: Low Risk  (01/08/2021)   Overall Financial Resource Strain (CARDIA)    Difficulty of Paying Living Expenses: Not hard at all  Food Insecurity: No Food Insecurity (01/08/2021)   Hunger Vital Sign    Worried About Running Out of Food in the Last Year: Never true    Santa Claus in the Last Year: Never true  Transportation Needs: No Transportation Needs (01/08/2021)   PRAPARE - Hydrologist (Medical): No    Lack of Transportation (Non-Medical): No  Physical  Activity: Sufficiently Active (01/08/2021)   Exercise Vital Sign    Days of Exercise per Week: 5 days    Minutes of Exercise per Session: 30 min  Stress: No Stress Concern Present (01/08/2021)   [place]    Feeling of Stress : Not at all  Social Connections: Moderately Integrated (01/08/2021)   Social Connection and Isolation Panel [NHANES]    Frequency of Communication with Friends and Family: More than three times a week    Frequency of Social Gatherings with Friends and Family: More than three times a week    Attends Religious Services: More than 4 times per year    Active Member of [employer] or Organizations: No    Attends Archivist Meetings: Never    Marital Status: Married  Human resources officer Violence: Not At Risk (01/08/2021)   Humiliation, Afraid, Rape, and Kick questionnaire    Fear of Current or Ex-Partner: No    Emotionally Abused: No    Physically Abused: No    Sexually Abused: No   Outpatient Medications Prior to Visit  Medication Sig Dispense Refill   acetaminophen (TYLENOL) 500 MG tablet Take 1,000 mg by mouth every 6 (six) hours as needed for moderate pain.     aluminum hydroxide-magnesium carbonate (GAVISCON) 95-358 MG/15ML SUSP Take 15 mLs by mouth as needed for heartburn or indigestion.     Cholecalciferol (VITAMIN D3) 10 MCG (400 UNIT) tablet Take 800 Units by mouth daily.     diclofenac Sodium (VOLTAREN) 1 % GEL Apply 1 application. topically 4 (four) times daily as needed (pain).     docusate sodium (COLACE) 100 MG capsule Take 1 capsule (100 mg total) by mouth daily as needed for up to 30 doses. 30 capsule 0   ferrous sulfate 325 (65 FE) MG tablet Take 325 mg by mouth 4 (four) times a week.     ibuprofen (ADVIL) 200 MG tablet Take 200 mg by mouth daily.     Multiple Vitamin (MULTIVITAMIN WITH MINERALS) TABS tablet Take 1 tablet by mouth 4 (four) times a week.     Omega-3 350 MG CPDR Take 350 mg by  mouth daily.     oxyCODONE-acetaminophen (PERCOCET) 5-325 MG tablet Take 1 tablet by mouth every 4 (four) hours as needed for up to 30 doses for severe pain. 30 tablet 0   prochlorperazine (COMPAZINE) 10 MG tablet Take 1 tablet (10 mg total) by mouth every 6 (six) hours as needed for nausea or vomiting. 30 tablet 0   tamsulosin (FLOMAX) 0.4 MG CAPS capsule Take 1 capsule (0.4 mg total) by mouth daily after supper. 90 capsule 3   telmisartan (MICARDIS) 80 MG tablet  Take 0.5 tablets (40 mg total) by mouth 2 (two) times daily. 180 tablet 1   TURMERIC PO Take 1 tablet by mouth 3 (three) times a week.     No facility-administered medications prior to visit.   No Known Allergies  Review of Systems  Constitutional:  Negative for chills and fever.  HENT:  Negative for congestion.   Respiratory:  Negative for shortness of breath.   Cardiovascular:  Negative for chest pain and palpitations.  Gastrointestinal:  Negative for abdominal pain, blood in stool, constipation, diarrhea, nausea and vomiting.  Genitourinary:  Negative for dysuria, frequency, hematuria and urgency.  Musculoskeletal:  Positive for back pain and joint pain (left wrist).  Skin:           Neurological:  Negative for headaches.      Objective:    Physical Exam Constitutional:      General: He is not in acute distress.    Appearance: Normal appearance. He is normal weight. He is not ill-appearing.  HENT:     Head: Normocephalic and atraumatic.     Right Ear: External ear normal.     Left Ear: External ear normal.     Nose: Nose normal.     Mouth/Throat:     Mouth: Mucous membranes are moist.     Pharynx: Oropharynx is clear.  Eyes:     General:        Right eye: No discharge.        Left eye: No discharge.     Extraocular Movements: Extraocular movements intact.     Pupils: Pupils are equal, round, and reactive to light.  Cardiovascular:     Rate and Rhythm: Normal rate and regular rhythm.     Pulses: Normal  pulses.     Heart sounds: Normal heart sounds. No murmur heard.    No gallop.  Pulmonary:     Effort: Pulmonary effort is normal. No respiratory distress.     Breath sounds: Normal breath sounds. No wheezing or rales.  Abdominal:     General: Bowel sounds are normal.     Tenderness: There is no abdominal tenderness. There is no guarding.  Musculoskeletal:        General: Normal range of motion.     Right lower leg: No edema.     Left lower leg: No edema.  Skin:    General: Skin is warm and dry.  Neurological:     Mental Status: He is alert and oriented to person, place, and time.  Psychiatric:        Mood and Affect: Mood normal.        Behavior: Behavior normal.        Judgment: Judgment normal.    There were no vitals taken for this visit. Wt Readings from Last 3 Encounters:  03/19/22 162 lb 14.4 oz (73.9 kg)  02/26/22 165 lb 12.8 oz (75.2 kg)  02/05/22 163 lb 1.6 oz (74 kg)   Diabetic Foot Exam - Simple   No data filed    Lab Results  Component Value Date   WBC 5.0 03/19/2022   HGB 13.6 03/19/2022   HCT 40.9 03/19/2022   PLT 156 03/19/2022   GLUCOSE 90 03/19/2022   CHOL 164 09/22/2021   TRIG 85.0 09/22/2021   HDL 54.00 09/22/2021   LDLCALC 93 09/22/2021   ALT 15 03/19/2022   AST 15 03/19/2022   NA 139 03/19/2022   K 4.6 03/19/2022   CL 106 03/19/2022  CREATININE 1.54 (H) 03/19/2022   BUN 32 (H) 03/19/2022   CO2 25 03/19/2022   TSH 1.206 03/19/2022   PSA 0.84 03/20/2021   HGBA1C 6.1 09/22/2021   Lab Results  Component Value Date   TSH 1.206 03/19/2022   Lab Results  Component Value Date   WBC 5.0 03/19/2022   HGB 13.6 03/19/2022   HCT 40.9 03/19/2022   MCV 93.8 03/19/2022   PLT 156 03/19/2022   Lab Results  Component Value Date   NA 139 03/19/2022   K 4.6 03/19/2022   CO2 25 03/19/2022   GLUCOSE 90 03/19/2022   BUN 32 (H) 03/19/2022   CREATININE 1.54 (H) 03/19/2022   BILITOT 0.7 03/19/2022   ALKPHOS 87 03/19/2022   AST 15 03/19/2022    ALT 15 03/19/2022   PROT 7.3 03/19/2022   ALBUMIN 4.4 03/19/2022   CALCIUM 9.6 03/19/2022   ANIONGAP 8 03/19/2022   GFR 59.11 (L) 09/22/2021   Lab Results  Component Value Date   CHOL 164 09/22/2021   Lab Results  Component Value Date   HDL 54.00 09/22/2021   Lab Results  Component Value Date   LDLCALC 93 09/22/2021   Lab Results  Component Value Date   TRIG 85.0 09/22/2021   Lab Results  Component Value Date   CHOLHDL 3 09/22/2021   Lab Results  Component Value Date   HGBA1C 6.1 09/22/2021      Assessment & Plan:   Immunizations: Patient has been informed about receiving COVID-19 and RSV immunizations.   Left Wrist Pain: Patient has been advised to apply Lidocaine 4% patches, cut into quarters, on his left wrist to manage the pain.  Labs: Routine lab work will be performed today.  Nephrology: Patient has been provided with nephrology referral.  Problem List Items Addressed This Visit       Cardiovascular and Mediastinum   Essential hypertension - Primary     Genitourinary   CRI (chronic renal insufficiency)     Other   Hyperlipidemia, mixed   Hyperglycemia   Vitamin D deficiency   Muscle cramps   No orders of the defined types were placed in this encounter.  I, Kellie Simmering, personally preformed the services described in this documentation.  All medical record entries made by the scribe were at my direction and in my presence.  I have reviewed the chart and discharge instructions (if applicable) and agree that the record reflects my personal performance and is accurate and complete. 03/31/2022  I,Mohammed Iqbal,acting as a scribe for Penni Homans, MD.,have documented all relevant documentation on the behalf of Penni Homans, MD,as directed by  Penni Homans, MD while in the presence of Penni Homans, MD.  Kellie Simmering

## 2022-03-31 NOTE — Patient Instructions (Addendum)
RSV (respiratory syncitial virus) vaccine at pharmacy, Arexvy Covid booster if willing at pharmacy

## 2022-03-31 NOTE — Assessment & Plan Note (Signed)
He thinks if is related to phone use. Continue to splint and add 1/4 Lidocaine patches qhs and then splint

## 2022-04-01 ENCOUNTER — Other Ambulatory Visit: Payer: Self-pay

## 2022-04-02 ENCOUNTER — Other Ambulatory Visit: Payer: Self-pay

## 2022-04-02 MED ORDER — ROSUVASTATIN CALCIUM 20 MG PO TABS: 10.0000 mg | ORAL_TABLET | Freq: Every day | ORAL | 3 refills | Status: DC

## 2022-04-02 NOTE — Assessment & Plan Note (Signed)
With ddd as well. Would consider surgical correction  but has to finish his cancer treatments first.

## 2022-04-06 ENCOUNTER — Telehealth: Payer: Self-pay | Admitting: Oncology

## 2022-04-06 NOTE — Telephone Encounter (Signed)
Called patients regarding upcoming December appointments, left a voicemail.

## 2022-04-09 ENCOUNTER — Inpatient Hospital Stay: Payer: Medicare Other | Attending: Oncology

## 2022-04-09 ENCOUNTER — Inpatient Hospital Stay (HOSPITAL_BASED_OUTPATIENT_CLINIC_OR_DEPARTMENT_OTHER): Payer: Medicare Other | Admitting: Oncology

## 2022-04-09 ENCOUNTER — Inpatient Hospital Stay: Payer: Medicare Other

## 2022-04-09 ENCOUNTER — Other Ambulatory Visit: Payer: Self-pay

## 2022-04-09 VITALS — BP 137/67 | HR 72 | Temp 97.7°F | Resp 16 | Ht 71.0 in | Wt 164.0 lb

## 2022-04-09 DIAGNOSIS — Z5112 Encounter for antineoplastic immunotherapy: Secondary | ICD-10-CM | POA: Diagnosis not present

## 2022-04-09 DIAGNOSIS — Z79899 Other long term (current) drug therapy: Secondary | ICD-10-CM | POA: Diagnosis not present

## 2022-04-09 DIAGNOSIS — C642 Malignant neoplasm of left kidney, except renal pelvis: Secondary | ICD-10-CM | POA: Diagnosis present

## 2022-04-09 DIAGNOSIS — C641 Malignant neoplasm of right kidney, except renal pelvis: Secondary | ICD-10-CM | POA: Diagnosis not present

## 2022-04-09 DIAGNOSIS — M79602 Pain in left arm: Secondary | ICD-10-CM | POA: Diagnosis not present

## 2022-04-09 DIAGNOSIS — R200 Anesthesia of skin: Secondary | ICD-10-CM | POA: Insufficient documentation

## 2022-04-09 DIAGNOSIS — E079 Disorder of thyroid, unspecified: Secondary | ICD-10-CM | POA: Insufficient documentation

## 2022-04-09 DIAGNOSIS — Z791 Long term (current) use of non-steroidal anti-inflammatories (NSAID): Secondary | ICD-10-CM | POA: Insufficient documentation

## 2022-04-09 DIAGNOSIS — M79643 Pain in unspecified hand: Secondary | ICD-10-CM | POA: Diagnosis not present

## 2022-04-09 LAB — CMP (CANCER CENTER ONLY)
ALT: 22 U/L (ref 0–44)
AST: 18 U/L (ref 15–41)
Albumin: 4.1 g/dL (ref 3.5–5.0)
Alkaline Phosphatase: 94 U/L (ref 38–126)
Anion gap: 7 (ref 5–15)
BUN: 27 mg/dL — ABNORMAL HIGH (ref 8–23)
CO2: 24 mmol/L (ref 22–32)
Calcium: 9.4 mg/dL (ref 8.9–10.3)
Chloride: 106 mmol/L (ref 98–111)
Creatinine: 1.65 mg/dL — ABNORMAL HIGH (ref 0.61–1.24)
GFR, Estimated: 42 mL/min — ABNORMAL LOW (ref >60)
Glucose, Bld: 109 mg/dL — ABNORMAL HIGH (ref 70–99)
Potassium: 4.3 mmol/L (ref 3.5–5.1)
Sodium: 137 mmol/L (ref 135–145)
Total Bilirubin: 0.6 mg/dL (ref 0.3–1.2)
Total Protein: 6.8 g/dL (ref 6.5–8.1)

## 2022-04-09 LAB — TSH: TSH: 1.524 u[IU]/mL (ref 0.350–4.500)

## 2022-04-09 LAB — CBC WITH DIFFERENTIAL (CANCER CENTER ONLY)
Abs Immature Granulocytes: 0.01 10*3/uL (ref 0.00–0.07)
Basophils Absolute: 0.1 10*3/uL (ref 0.0–0.1)
Basophils Relative: 1 %
Eosinophils Absolute: 0.1 10*3/uL (ref 0.0–0.5)
Eosinophils Relative: 3 %
HCT: 40.1 % (ref 39.0–52.0)
Hemoglobin: 13.6 g/dL (ref 13.0–17.0)
Immature Granulocytes: 0 %
Lymphocytes Relative: 22 %
Lymphs Abs: 1 10*3/uL (ref 0.7–4.0)
MCH: 31.7 pg (ref 26.0–34.0)
MCHC: 33.9 g/dL (ref 30.0–36.0)
MCV: 93.5 fL (ref 80.0–100.0)
Monocytes Absolute: 0.5 10*3/uL (ref 0.1–1.0)
Monocytes Relative: 12 %
Neutro Abs: 2.9 10*3/uL (ref 1.7–7.7)
Neutrophils Relative %: 62 %
Platelet Count: 165 10*3/uL (ref 150–400)
RBC: 4.29 MIL/uL (ref 4.22–5.81)
RDW: 12.9 % (ref 11.5–15.5)
WBC Count: 4.6 10*3/uL (ref 4.0–10.5)
nRBC: 0 % (ref 0.0–0.2)

## 2022-04-09 MED ORDER — SODIUM CHLORIDE 0.9 % IV SOLN
200.0000 mg | Freq: Once | INTRAVENOUS | Status: AC
  Administered 2022-04-09: 200 mg via INTRAVENOUS
  Filled 2022-04-09: qty 200

## 2022-04-09 MED ORDER — SODIUM CHLORIDE 0.9 % IV SOLN: Freq: Once | INTRAVENOUS | Status: AC

## 2022-04-09 NOTE — Progress Notes (Signed)
Hematology and Oncology Follow Up Visit  Micheal Mcintyre [id number] [birth year] 81 y.o. 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   Prior therapy: He is status post right open radical nephrectomy completed on October 15, 2021.  Current therapy: Pembrolizumab 200 mg every 3 weeks started on December 03, 2021.  He is here for cycle 7 of therapy.  Interim History: Micheal Mcintyre presents today for a repeat evaluation.  Since last visit, he reports no major changes in his health.  He denies any nausea, vomiting or abdominal pain.  He does report some minor joint pains predominantly in his left wrist and thumb is manageable with Tylenol.  He denies any skin rash or lesions.  He denies any excessive pruritus or fatigue.  His performance status quality of life remains unchanged.     Medications: Reviewed without changes. Current Outpatient Medications  Medication Sig Dispense Refill   acetaminophen (TYLENOL) 500 MG tablet Take 1,000 mg by mouth every 6 (six) hours as needed for moderate pain.     aluminum hydroxide-magnesium carbonate (GAVISCON) 95-358 MG/15ML SUSP Take 15 mLs by mouth as needed for heartburn or indigestion.     Cholecalciferol (VITAMIN D3) 10 MCG (400 UNIT) tablet Take 800 Units by mouth daily.     diclofenac Sodium (VOLTAREN) 1 % GEL Apply 1 application. topically 4 (four) times daily as needed (pain).     ferrous sulfate 325 (65 FE) MG tablet Take 325 mg by mouth 4 (four) times a week.     ibuprofen (ADVIL) 200 MG tablet Take 200 mg by mouth daily.     Multiple Vitamin (MULTIVITAMIN WITH MINERALS) TABS tablet Take 1 tablet by mouth 4 (four) times a week.     Omega-3 350 MG CPDR Take 350 mg by mouth daily.     rosuvastatin (CRESTOR) 20 MG tablet Take 0.5 tablets (10 mg total) by mouth daily. 90 tablet 3   RSV vaccine recomb adjuvanted (AREXVY) 120 MCG/0.5ML  injection Inject into the muscle. 0.5 mL 0   telmisartan (MICARDIS) 80 MG tablet Take 0.5 tablets (40 mg total) by mouth 2 (two) times daily. 180 tablet 1   TURMERIC PO Take 1 tablet by mouth 3 (three) times a week.     No current facility-administered medications for this visit.     Allergies:  Allergies  Allergen Reactions   Statins Other (See Comments)    myalgia      Physical Exam:    Blood pressure 137/67, pulse 72, temperature 97.7 F (36.5 C), temperature source Temporal, resp. rate 16, height '5\' 11"'$  (1.803 m), weight 164 lb (74.4 kg), SpO2 99 %.   ECOG: 1    General appearance: Alert, awake without any distress. Head: Atraumatic without abnormalities Oropharynx: Without any thrush or ulcers. Eyes: No scleral icterus. Lymph nodes: No lymphadenopathy noted in the cervical, supraclavicular, or axillary nodes Heart:regular rate and rhythm, without any murmurs or gallops.   Lung: Clear to auscultation without any rhonchi, wheezes or dullness to percussion. Abdomin: Soft, nontender without any shifting dullness or ascites. Musculoskeletal: No clubbing or cyanosis. Neurological: No motor or sensory deficits. Skin: No rashes or lesions.        Lab Results: Lab Results  Component Value Date   WBC 5.0 03/19/2022   HGB 13.6 03/19/2022   HCT 40.9 03/19/2022   MCV 93.8 03/19/2022   PLT 156 03/19/2022  PSA 0.84 03/20/2021     Chemistry      Component Value Date/Time   NA 141 03/31/2022 1028   K 4.8 03/31/2022 1028   CL 105 03/31/2022 1028   CO2 28 03/31/2022 1028   BUN 26 (H) 03/31/2022 1028   CREATININE 1.55 (H) 03/31/2022 1028   CREATININE 1.54 (H) 03/19/2022 0950   CREATININE 1.09 02/06/2020 1306      Component Value Date/Time   CALCIUM 9.5 03/31/2022 1028   ALKPHOS 87 03/31/2022 1028   AST 18 03/31/2022 1028   AST 15 03/19/2022 0950   ALT 20 03/31/2022 1028   ALT 15 03/19/2022 0950   BILITOT 0.8 03/31/2022 1028   BILITOT 0.7 03/19/2022 0950         Impression and Plan:    81 year old with:  1. T3a papillary kidney cancer with rhabdoid features diagnosed in May 2023.   The natural course of this disease and treatment options were discussed at this time.  He continues to tolerate Pembrolizumab without any major complications.  Risks and benefits of continuing this treatment to complete 1 year of therapy were discussed.  Autoimmune complications as well as GI toxicity and dermatological concerns were reiterated.  After discussion today he is agreeable to continue and he will have repeat imaging studies tentatively in April 2024 under the care of [facility].   2.  IV access: Port-A-Cath is needed at this time.  Peripheral veins are currently in use.   3.  Antiemetics: Compazine is available to him without any nausea or vomiting.   4.  Autoimmune complications: To educate him about potential complications including pneumonitis, colitis and thyroid disease.  5.  Follow-up: He will return in 3 weeks for the next cycle of therapy.   30  minutes were dedicated to this encounter.  The time was spent on reviewing his disease status, treatment choices and outlining future plan of care.    Micheal Button, MD 12/7/20238:10 AM

## 2022-04-09 NOTE — Patient Instructions (Signed)
[facility]   Discharge Instructions: Thank you for choosing [facility] to provide your oncology and hematology care.   If you have a lab appointment with the Cancer Center, please go directly to the Cancer Center and check in at the registration area.   Wear comfortable clothing and clothing appropriate for easy access to any Portacath or PICC line.   We strive to give you quality time with your provider. You may need to reschedule your appointment if you arrive late (15 or more minutes).  Arriving late affects you and other patients whose appointments are after yours.  Also, if you miss three or more appointments without notifying the office, you may be dismissed from the clinic at the provider's discretion.      For prescription refill requests, have your pharmacy contact our office and allow 72 hours for refills to be completed.    Today you received the following chemotherapy and/or immunotherapy agents: pembrolizumab      To help prevent nausea and vomiting after your treatment, we encourage you to take your nausea medication as directed.  BELOW ARE SYMPTOMS THAT SHOULD BE REPORTED IMMEDIATELY: *FEVER GREATER THAN 100.4 F (38 C) OR HIGHER *CHILLS OR SWEATING *NAUSEA AND VOMITING THAT IS NOT CONTROLLED WITH YOUR NAUSEA MEDICATION *UNUSUAL SHORTNESS OF BREATH *UNUSUAL BRUISING OR BLEEDING *URINARY PROBLEMS (pain or burning when urinating, or frequent urination) *BOWEL PROBLEMS (unusual diarrhea, constipation, pain near the anus) TENDERNESS IN MOUTH AND THROAT WITH OR WITHOUT PRESENCE OF ULCERS (sore throat, sores in mouth, or a toothache) UNUSUAL RASH, SWELLING OR PAIN  UNUSUAL VAGINAL DISCHARGE OR ITCHING   Items with * indicate a potential emergency and should be followed up as soon as possible or go to the Emergency Department if any problems should occur.  Please show the CHEMOTHERAPY ALERT CARD or IMMUNOTHERAPY ALERT CARD at  check-in to the Emergency Department and triage nurse.  Should you have questions after your visit or need to cancel or reschedule your appointment, please contact [facility]  Dept: [phone]  and follow the prompts.  Office hours are 8:00 a.m. to 4:30 p.m. Monday - Friday. Please note that voicemails left after 4:00 p.m. may not be returned until the following business day.  We are closed weekends and major holidays. You have access to a nurse at all times for urgent questions. Please call the main number to the clinic Dept: [phone] and follow the prompts.   For any non-urgent questions, you may also contact your provider using MyChart. We now offer e-Visits for anyone 18 and older to request care online for non-urgent symptoms. For details visit [email or web address].   Also download the MyChart app! Go to the app store, search "MyChart", open the app, select [facility], and log in with your MyChart username and password.  Masks are optional in the cancer centers. If you would like for your care team to wear a mask while they are taking care of you, please let them know. You may have one support person who is at least 79 years old accompany you for your appointments. 

## 2022-04-22 LAB — HEPATIC FUNCTION PANEL
ALT: 22 U/L (ref 10–40)
AST: 21 (ref 14–40)
Alkaline Phosphatase: 108 (ref 25–125)
Bilirubin, Total: 0.5

## 2022-04-22 LAB — COMPREHENSIVE METABOLIC PANEL
Albumin: 4.5 (ref 3.5–5.0)
Calcium: 9.7 (ref 8.7–10.7)
Globulin: 2.5
eGFR: 46

## 2022-04-22 LAB — BASIC METABOLIC PANEL
BUN: 21 (ref 4–21)
CO2: 23 — AB (ref 13–22)
Chloride: 102 (ref 99–108)
Creatinine: 1.5 — AB (ref 0.6–1.3)
Glucose: 88
Potassium: 5 mEq/L (ref 3.5–5.1)
Sodium: 140 (ref 137–147)

## 2022-04-22 LAB — CBC AND DIFFERENTIAL
HCT: 39 — AB (ref 41–53)
Hemoglobin: 13.5 (ref 13.5–17.5)
Neutrophils Absolute: 4.3
Platelets: 165 10*3/uL (ref 150–400)
WBC: 6.1

## 2022-04-22 LAB — CBC: RBC: 4.23 (ref 3.87–5.11)

## 2022-04-22 LAB — PROTEIN / CREATININE RATIO, URINE: Creatinine, Urine: 22.7

## 2022-04-30 ENCOUNTER — Other Ambulatory Visit: Payer: Self-pay

## 2022-04-30 ENCOUNTER — Inpatient Hospital Stay: Payer: Medicare Other

## 2022-04-30 ENCOUNTER — Inpatient Hospital Stay (HOSPITAL_BASED_OUTPATIENT_CLINIC_OR_DEPARTMENT_OTHER): Payer: Medicare Other | Admitting: Oncology

## 2022-04-30 ENCOUNTER — Encounter: Payer: Self-pay | Admitting: Family Medicine

## 2022-04-30 DIAGNOSIS — C641 Malignant neoplasm of right kidney, except renal pelvis: Secondary | ICD-10-CM

## 2022-04-30 DIAGNOSIS — C642 Malignant neoplasm of left kidney, except renal pelvis: Secondary | ICD-10-CM | POA: Diagnosis not present

## 2022-04-30 LAB — CBC WITH DIFFERENTIAL (CANCER CENTER ONLY)
Abs Immature Granulocytes: 0.01 10*3/uL (ref 0.00–0.07)
Basophils Absolute: 0.1 10*3/uL (ref 0.0–0.1)
Basophils Relative: 1 %
Eosinophils Absolute: 0.1 10*3/uL (ref 0.0–0.5)
Eosinophils Relative: 2 %
HCT: 40.1 % (ref 39.0–52.0)
Hemoglobin: 13.6 g/dL (ref 13.0–17.0)
Immature Granulocytes: 0 %
Lymphocytes Relative: 22 %
Lymphs Abs: 0.9 10*3/uL (ref 0.7–4.0)
MCH: 32 pg (ref 26.0–34.0)
MCHC: 33.9 g/dL (ref 30.0–36.0)
MCV: 94.4 fL (ref 80.0–100.0)
Monocytes Absolute: 0.5 10*3/uL (ref 0.1–1.0)
Monocytes Relative: 11 %
Neutro Abs: 2.8 10*3/uL (ref 1.7–7.7)
Neutrophils Relative %: 64 %
Platelet Count: 156 10*3/uL (ref 150–400)
RBC: 4.25 MIL/uL (ref 4.22–5.81)
RDW: 13 % (ref 11.5–15.5)
WBC Count: 4.3 10*3/uL (ref 4.0–10.5)
nRBC: 0 % (ref 0.0–0.2)

## 2022-04-30 LAB — CMP (CANCER CENTER ONLY)
ALT: 26 U/L (ref 0–44)
AST: 20 U/L (ref 15–41)
Albumin: 4.3 g/dL (ref 3.5–5.0)
Alkaline Phosphatase: 90 U/L (ref 38–126)
Anion gap: 8 (ref 5–15)
BUN: 32 mg/dL — ABNORMAL HIGH (ref 8–23)
CO2: 25 mmol/L (ref 22–32)
Calcium: 9.5 mg/dL (ref 8.9–10.3)
Chloride: 105 mmol/L (ref 98–111)
Creatinine: 1.41 mg/dL — ABNORMAL HIGH (ref 0.61–1.24)
GFR, Estimated: 51 mL/min — ABNORMAL LOW (ref >60)
Glucose, Bld: 116 mg/dL — ABNORMAL HIGH (ref 70–99)
Potassium: 4.4 mmol/L (ref 3.5–5.1)
Sodium: 138 mmol/L (ref 135–145)
Total Bilirubin: 0.7 mg/dL (ref 0.3–1.2)
Total Protein: 7.1 g/dL (ref 6.5–8.1)

## 2022-04-30 LAB — TSH: TSH: 1.511 u[IU]/mL (ref 0.350–4.500)

## 2022-04-30 MED ORDER — SODIUM CHLORIDE 0.9 % IV SOLN
200.0000 mg | Freq: Once | INTRAVENOUS | Status: AC
  Administered 2022-04-30: 200 mg via INTRAVENOUS
  Filled 2022-04-30: qty 200

## 2022-04-30 MED ORDER — SODIUM CHLORIDE 0.9 % IV SOLN: Freq: Once | INTRAVENOUS | Status: AC

## 2022-04-30 NOTE — Progress Notes (Signed)
Hematology and Oncology Follow Up Visit  Micheal Mcintyre [id number] [birth year] 81 y.o. 04/30/2022 9:25 AM Micheal Mcintyre, MDShadad, [name], MD   Principle Diagnosis: 6 year old with kidney cancer diagnosed in May 2023.  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   Prior therapy: He is status post right open radical nephrectomy completed on October 15, 2021.  Current therapy: Pembrolizumab 200 mg every 3 weeks started on December 03, 2021.  He is here for cycle 8 of therapy out of planned 17.  Interim History: [name] returns today for a follow-up visit.  Since the last visit, he reports feeling well without any major complaints.  He has reported left arm and hand pain and numbness.  This is exacerbated at nighttime he takes Tylenol with improvement.  He is currently under evaluation for possible carpal tunnel syndrome.  He denies any nausea, vomiting or abdominal pain.  He denies any diarrhea or weight loss.  He denies any shortness of breath or difficulty breathing.     Medications: Updated on review. Current Outpatient Medications  Medication Sig Dispense Refill   acetaminophen (TYLENOL) 500 MG tablet Take 1,000 mg by mouth every 6 (six) hours as needed for moderate pain.     aluminum hydroxide-magnesium carbonate (GAVISCON) 95-358 MG/15ML SUSP Take 15 mLs by mouth as needed for heartburn or indigestion.     Cholecalciferol (VITAMIN D3) 10 MCG (400 UNIT) tablet Take 800 Units by mouth daily.     diclofenac Sodium (VOLTAREN) 1 % GEL Apply 1 application. topically 4 (four) times daily as needed (pain).     ferrous sulfate 325 (65 FE) MG tablet Take 325 mg by mouth 4 (four) times a week.     ibuprofen (ADVIL) 200 MG tablet Take 200 mg by mouth daily.     Multiple Vitamin (MULTIVITAMIN WITH MINERALS) TABS tablet Take 1 tablet by mouth 4 (four) times a week.     Omega-3 350 MG CPDR Take 350 mg by mouth daily.     rosuvastatin (CRESTOR) 20 MG tablet Take 0.5  tablets (10 mg total) by mouth daily. 90 tablet 3   RSV vaccine recomb adjuvanted (AREXVY) 120 MCG/0.5ML injection Inject into the muscle. 0.5 mL 0   telmisartan (MICARDIS) 80 MG tablet Take 0.5 tablets (40 mg total) by mouth 2 (two) times daily. 180 tablet 1   TURMERIC PO Take 1 tablet by mouth 3 (three) times a week.     No current facility-administered medications for this visit.     Allergies:  Allergies  Allergen Reactions   Statins Other (See Comments)    myalgia      Physical Exam:   Blood pressure 120/63, pulse 76, temperature (!) 97.2 F (36.2 C), temperature source Temporal, resp. rate 17, height '5\' 11"'$  (1.803 m), weight 164 lb 14.4 oz (74.8 kg), SpO2 98 %.     ECOG: 1   General appearance: Comfortable appearing without any discomfort Head: Normocephalic without any trauma Oropharynx: Mucous membranes are moist and pink without any thrush or ulcers. Eyes: Pupils are equal and round reactive to light. Lymph nodes: No cervical, supraclavicular, inguinal or axillary lymphadenopathy.   Heart:regular rate and rhythm.  S1 and S2 without leg edema. Lung: Clear without any rhonchi or wheezes.  No dullness to percussion. Abdomin: Soft, nontender, nondistended with good bowel sounds.  No hepatosplenomegaly. Musculoskeletal: No joint deformity or effusion.  Full range of motion noted. Neurological: No deficits noted on motor, sensory and  deep tendon reflex exam. Skin: No petechial rash or dryness.  Appeared moist.          Lab Results: Lab Results  Component Value Date   WBC 4.6 04/09/2022   HGB 13.6 04/09/2022   HCT 40.1 04/09/2022   MCV 93.5 04/09/2022   PLT 165 04/09/2022   PSA 0.84 03/20/2021     Chemistry      Component Value Date/Time   NA 137 04/09/2022 0817   K 4.3 04/09/2022 0817   CL 106 04/09/2022 0817   CO2 24 04/09/2022 0817   BUN 27 (H) 04/09/2022 0817   CREATININE 1.65 (H) 04/09/2022 0817   CREATININE 1.09 02/06/2020 1306       Component Value Date/Time   CALCIUM 9.4 04/09/2022 0817   ALKPHOS 94 04/09/2022 0817   AST 18 04/09/2022 0817   ALT 22 04/09/2022 0817   BILITOT 0.6 04/09/2022 0817        Impression and Plan:    82 year old with:  1.  Kidney cancer diagnosed May 2023.  He was found to have T3a papillary with rhabdoid features.   He continues to tolerate Pembrolizumab without any major complications.  Risks and benefits of continuing this treatment were reiterated again.  Plan is to complete a year of therapy which would tentatively conclude around July 2024.  Repeat imaging studies will continue in addition to adjuvant therapy and will be completed in April 2024 under the care of [facility].  He is agreeable to proceed at this time.  Interrupting therapy or discontinuation of therapy could be considered if he develops side effects from this treatment.     2.  IV access: Peripheral veins remain in use at this time.   3.  Antiemetics: No nausea or vomiting reported at this time.  Compazine is available to him.   4.  Autoimmune complications: These complications including pneumonitis, colitis, hepatitis and thyroid disease were reiterated.  He has not experienced any at this time.  5.  Arm pain and numbness: This is predominantly on the left side and more consistent with carpal tunnel syndrome rather than autoimmune considerations.  He has follow-up with sports medicine regarding this issue.  6.  Follow-up: In 3 weeks for the next cycle of therapy.   30  minutes were spent on this visit.  The time was dedicated to updating his disease status, addressing complications to his cancer and cancer therapy and future plan of care discussion.    Zola Button, MD 12/28/20239:25 AM

## 2022-04-30 NOTE — Patient Instructions (Signed)
[facility]  Discharge Instructions: Thank you for choosing [facility] to provide your oncology and hematology care.   If you have a lab appointment with the Cancer Center, please go directly to the Cancer Center and check in at the registration area.   Wear comfortable clothing and clothing appropriate for easy access to any Portacath or PICC line.   We strive to give you quality time with your provider. You may need to reschedule your appointment if you arrive late (15 or more minutes).  Arriving late affects you and other patients whose appointments are after yours.  Also, if you miss three or more appointments without notifying the office, you may be dismissed from the clinic at the provider's discretion.      For prescription refill requests, have your pharmacy contact our office and allow 72 hours for refills to be completed.    Today you received the following chemotherapy and/or immunotherapy agents: Keytruda      To help prevent nausea and vomiting after your treatment, we encourage you to take your nausea medication as directed.  BELOW ARE SYMPTOMS THAT SHOULD BE REPORTED IMMEDIATELY: *FEVER GREATER THAN 100.4 F (38 C) OR HIGHER *CHILLS OR SWEATING *NAUSEA AND VOMITING THAT IS NOT CONTROLLED WITH YOUR NAUSEA MEDICATION *UNUSUAL SHORTNESS OF BREATH *UNUSUAL BRUISING OR BLEEDING *URINARY PROBLEMS (pain or burning when urinating, or frequent urination) *BOWEL PROBLEMS (unusual diarrhea, constipation, pain near the anus) TENDERNESS IN MOUTH AND THROAT WITH OR WITHOUT PRESENCE OF ULCERS (sore throat, sores in mouth, or a toothache) UNUSUAL RASH, SWELLING OR PAIN  UNUSUAL VAGINAL DISCHARGE OR ITCHING   Items with * indicate a potential emergency and should be followed up as soon as possible or go to the Emergency Department if any problems should occur.  Please show the CHEMOTHERAPY ALERT CARD or IMMUNOTHERAPY ALERT CARD at check-in to  the Emergency Department and triage nurse.  Should you have questions after your visit or need to cancel or reschedule your appointment, please contact [facility]  Dept: [phone]  and follow the prompts.  Office hours are 8:00 a.m. to 4:30 p.m. Monday - Friday. Please note that voicemails left after 4:00 p.m. may not be returned until the following business day.  We are closed weekends and major holidays. You have access to a nurse at all times for urgent questions. Please call the main number to the clinic Dept: [phone] and follow the prompts.   For any non-urgent questions, you may also contact your provider using MyChart. We now offer e-Visits for anyone 18 and older to request care online for non-urgent symptoms. For details visit [email or web address].   Also download the MyChart app! Go to the app store, search "MyChart", open the app, select [facility], and log in with your MyChart username and password.   

## 2022-05-05 ENCOUNTER — Ambulatory Visit: Payer: Self-pay

## 2022-05-05 ENCOUNTER — Encounter: Payer: Self-pay | Admitting: Family Medicine

## 2022-05-05 ENCOUNTER — Ambulatory Visit (INDEPENDENT_AMBULATORY_CARE_PROVIDER_SITE_OTHER): Payer: Medicare Other | Admitting: Family Medicine

## 2022-05-05 VITALS — BP 90/52 | Wt 164.0 lb

## 2022-05-05 DIAGNOSIS — G5602 Carpal tunnel syndrome, left upper limb: Secondary | ICD-10-CM | POA: Diagnosis not present

## 2022-05-05 DIAGNOSIS — M25532 Pain in left wrist: Secondary | ICD-10-CM

## 2022-05-05 NOTE — Patient Instructions (Signed)
Good to see you Please try the brace, mainly at night.  Please try the exercises  You can continue voltaren gel   Please send me a message in MyChart with any questions or updates.  Please see me back in 4-6 weeks.   --Dr. Raeford Razor

## 2022-05-05 NOTE — Assessment & Plan Note (Signed)
Acutely occurring.  Clinical exam and imaging consistent with carpal tunnel syndrome. -Counseled on home exercise therapy and supportive care. -Provided brace. -Could consider injection or physical therapy

## 2022-05-05 NOTE — Progress Notes (Signed)
  Micheal Mcintyre - 82 y.o. male MRN [id number]  Date of birth: Aug 22, 1940  SUBJECTIVE:  Including CC & ROS.  No chief complaint on file.   Micheal Mcintyre is a 82 y.o. male that is presenting with left hand pain.  The pain is occurring over the palmar aspect.  Does seem to be worse at night.  It is a burning sensation.   Review of Systems See HPI   HISTORY: Past Medical, Surgical, Social, and Family History Reviewed & Updated per EMR.   Pertinent Historical Findings include:  Past Medical History:  Diagnosis Date   Arthritis    GERD (gastroesophageal reflux disease)    Hypertension    Medicare annual wellness visit, subsequent 08/19/2014   Osteoarthritis 09/21/2007   Qualifier: Diagnosis of  By: Niel Hummer MD, Izell [place] R    PONV (postoperative nausea and vomiting)    Preventative health care 06/07/2016   Right shoulder pain 12/03/2016    Past Surgical History:  Procedure Laterality Date   CHOLECYSTECTOMY     HIP SURGERY     JOINT REPLACEMENT     NEPHRECTOMY Right 10/15/2021   Procedure: NEPHRECTOMY, OPEN;  Surgeon: Janith Lima, MD;  Location: [facility];  Service: Urology;  Laterality: Right;   nose skin cancer removal     TOTAL HIP ARTHROPLASTY       PHYSICAL EXAM:  VS: BP (!) 90/52 (BP Location: Left Arm, Patient Position: Sitting)   Wt 164 lb (74.4 kg)   BMI 22.87 kg/m  Physical Exam Gen: NAD, alert, cooperative with exam, well-appearing MSK:  Neurovascularly intact    Limited ultrasound: Left wrist pain:  Normal-appearing CMC joint. Median nerve is flattened and measures 0.14 cm. Right median nerve measures 0.12 cm  Summary: Findings consistent with carpal tunnel syndrome in the left and right wrist.  Ultrasound and interpretation by Clearance Coots, MD    ASSESSMENT & PLAN:   Carpal tunnel syndrome, left Acutely occurring.  Clinical exam and imaging consistent with carpal tunnel syndrome. -Counseled on home exercise therapy and supportive care. -Provided  brace. -Could consider injection or physical therapy

## 2022-05-21 ENCOUNTER — Inpatient Hospital Stay: Payer: Medicare Other | Attending: Oncology

## 2022-05-21 ENCOUNTER — Telehealth: Payer: Self-pay | Admitting: Medical Oncology

## 2022-05-21 ENCOUNTER — Inpatient Hospital Stay: Payer: Medicare Other

## 2022-05-21 ENCOUNTER — Inpatient Hospital Stay (HOSPITAL_BASED_OUTPATIENT_CLINIC_OR_DEPARTMENT_OTHER): Payer: Medicare Other | Admitting: Internal Medicine

## 2022-05-21 ENCOUNTER — Other Ambulatory Visit: Payer: Self-pay

## 2022-05-21 DIAGNOSIS — C641 Malignant neoplasm of right kidney, except renal pelvis: Secondary | ICD-10-CM | POA: Diagnosis not present

## 2022-05-21 DIAGNOSIS — Z79899 Other long term (current) drug therapy: Secondary | ICD-10-CM | POA: Insufficient documentation

## 2022-05-21 DIAGNOSIS — I10 Essential (primary) hypertension: Secondary | ICD-10-CM | POA: Insufficient documentation

## 2022-05-21 DIAGNOSIS — K219 Gastro-esophageal reflux disease without esophagitis: Secondary | ICD-10-CM | POA: Diagnosis not present

## 2022-05-21 DIAGNOSIS — Z791 Long term (current) use of non-steroidal anti-inflammatories (NSAID): Secondary | ICD-10-CM | POA: Insufficient documentation

## 2022-05-21 DIAGNOSIS — Z5112 Encounter for antineoplastic immunotherapy: Secondary | ICD-10-CM | POA: Diagnosis not present

## 2022-05-21 DIAGNOSIS — M1990 Unspecified osteoarthritis, unspecified site: Secondary | ICD-10-CM | POA: Diagnosis not present

## 2022-05-21 DIAGNOSIS — C642 Malignant neoplasm of left kidney, except renal pelvis: Secondary | ICD-10-CM | POA: Insufficient documentation

## 2022-05-21 LAB — CMP (CANCER CENTER ONLY)
ALT: 24 U/L (ref 0–44)
AST: 20 U/L (ref 15–41)
Albumin: 4.1 g/dL (ref 3.5–5.0)
Alkaline Phosphatase: 92 U/L (ref 38–126)
Anion gap: 8 (ref 5–15)
BUN: 26 mg/dL — ABNORMAL HIGH (ref 8–23)
CO2: 24 mmol/L (ref 22–32)
Calcium: 9.5 mg/dL (ref 8.9–10.3)
Chloride: 105 mmol/L (ref 98–111)
Creatinine: 1.56 mg/dL — ABNORMAL HIGH (ref 0.61–1.24)
GFR, Estimated: 45 mL/min — ABNORMAL LOW (ref >60)
Glucose, Bld: 98 mg/dL (ref 70–99)
Potassium: 4.5 mmol/L (ref 3.5–5.1)
Sodium: 137 mmol/L (ref 135–145)
Total Bilirubin: 0.5 mg/dL (ref 0.3–1.2)
Total Protein: 6.6 g/dL (ref 6.5–8.1)

## 2022-05-21 LAB — CBC WITH DIFFERENTIAL (CANCER CENTER ONLY)
Abs Immature Granulocytes: 0.01 10*3/uL (ref 0.00–0.07)
Basophils Absolute: 0.1 10*3/uL (ref 0.0–0.1)
Basophils Relative: 1 %
Eosinophils Absolute: 0.2 10*3/uL (ref 0.0–0.5)
Eosinophils Relative: 3 %
HCT: 36.9 % — ABNORMAL LOW (ref 39.0–52.0)
Hemoglobin: 12.9 g/dL — ABNORMAL LOW (ref 13.0–17.0)
Immature Granulocytes: 0 %
Lymphocytes Relative: 22 %
Lymphs Abs: 1.1 10*3/uL (ref 0.7–4.0)
MCH: 32.9 pg (ref 26.0–34.0)
MCHC: 35 g/dL (ref 30.0–36.0)
MCV: 94.1 fL (ref 80.0–100.0)
Monocytes Absolute: 0.6 10*3/uL (ref 0.1–1.0)
Monocytes Relative: 11 %
Neutro Abs: 3.2 10*3/uL (ref 1.7–7.7)
Neutrophils Relative %: 63 %
Platelet Count: 177 10*3/uL (ref 150–400)
RBC: 3.92 MIL/uL — ABNORMAL LOW (ref 4.22–5.81)
RDW: 12.4 % (ref 11.5–15.5)
WBC Count: 5.1 10*3/uL (ref 4.0–10.5)
nRBC: 0 % (ref 0.0–0.2)

## 2022-05-21 LAB — TSH: TSH: 1.701 u[IU]/mL (ref 0.350–4.500)

## 2022-05-21 MED ORDER — SODIUM CHLORIDE 0.9 % IV SOLN: Freq: Once | INTRAVENOUS | Status: AC

## 2022-05-21 MED ORDER — SODIUM CHLORIDE 0.9 % IV SOLN
200.0000 mg | Freq: Once | INTRAVENOUS | Status: AC
  Administered 2022-05-21: 200 mg via INTRAVENOUS
  Filled 2022-05-21: qty 200

## 2022-05-21 NOTE — Telephone Encounter (Signed)
LVM -Does pharmacy have record of Shingrex administration in  June 2023?

## 2022-05-21 NOTE — Progress Notes (Signed)
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   PRIOR THERAPY: Status post right open radical nephrectomy completed on October 15, 2021 by Dr. Rexene Alberts.   CURRENT THERAPY: Keytruda 200 Mg IV every 3 weeks started December 03, 2021.  Status post 8 cycles.  INTERVAL HISTORY: Micheal Mcintyre 82 y.o. male a former patient of Dr. Alen Blew who is here today to establish care with me after Dr. Alen Blew left the practice.  The patient was accompanied by his wife.  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 Abner Greenspan.  The patient was seen by Dr. Alen Blew and started treatment with [facility] on December 03, 2021 status post 8 cycles.  He has been tolerating this treatment fairly well.  He denied having any current chest pain, shortness of breath, cough or hemoptysis.  He has no nausea, vomiting, diarrhea or constipation.  He has no headache or visual changes.  He is here today for evaluation before starting cycle #9.  MEDICAL HISTORY: Past Medical History:  Diagnosis Date   Arthritis    GERD (gastroesophageal reflux disease)    Hypertension    Medicare annual wellness visit, subsequent 08/19/2014   Osteoarthritis 09/21/2007   Qualifier: Diagnosis of  By: Niel Hummer MD, Izell Roy R    PONV (postoperative nausea and vomiting)    Preventative health care 06/07/2016   Right shoulder pain 12/03/2016    ALLERGIES:  is allergic to statins.  MEDICATIONS:  Current Outpatient Medications  Medication Sig Dispense Refill   amLODipine (NORVASC) 5 MG tablet Take 5 mg by mouth daily.     acetaminophen (TYLENOL) 500 MG tablet Take 1,000 mg by mouth every 6 (six) hours as needed for moderate  pain.     aluminum hydroxide-magnesium carbonate (GAVISCON) 95-358 MG/15ML SUSP Take 15 mLs by mouth as needed for heartburn or indigestion.     Cholecalciferol (VITAMIN D3) 10 MCG (400 UNIT) tablet Take 800 Units by mouth daily.     diclofenac Sodium (VOLTAREN) 1 % GEL Apply 1 application. topically 4 (four) times daily as needed (pain).     ferrous sulfate 325 (65 FE) MG tablet Take 325 mg by mouth 4 (four) times a week.     ibuprofen (ADVIL) 200 MG tablet Take 200 mg by mouth daily.     Multiple Vitamin (MULTIVITAMIN WITH MINERALS) TABS tablet Take 1 tablet by mouth 4 (four) times a week.     Omega-3 350 MG CPDR Take 350 mg by mouth daily.     rosuvastatin (CRESTOR) 20 MG tablet Take 0.5 tablets (10 mg total) by mouth daily. 90 tablet 3   RSV vaccine recomb adjuvanted (AREXVY) 120 MCG/0.5ML injection Inject into the muscle. 0.5 mL 0   telmisartan (MICARDIS) 80 MG tablet Take 0.5 tablets (40 mg total) by mouth 2 (two) times daily. 180 tablet 1   TURMERIC PO Take 1 tablet by mouth 3 (three) times a week.     No current facility-administered medications for this visit.    SURGICAL HISTORY:  Past Surgical History:  Procedure Laterality Date   CHOLECYSTECTOMY     HIP SURGERY     JOINT REPLACEMENT  NEPHRECTOMY Right 10/15/2021   Procedure: NEPHRECTOMY, OPEN;  Surgeon: Janith Lima, MD;  Location: [facility];  Service: Urology;  Laterality: Right;   nose skin cancer removal     TOTAL HIP ARTHROPLASTY      REVIEW OF SYSTEMS:  Constitutional: negative Eyes: negative Ears, nose, mouth, throat, and face: negative Respiratory: negative Cardiovascular: negative Gastrointestinal: negative Genitourinary:negative Integument/breast: positive for pruritus Hematologic/lymphatic: negative Musculoskeletal:positive for arthralgias Neurological: negative Behavioral/Psych: negative Endocrine: negative Allergic/Immunologic: negative   PHYSICAL EXAMINATION: General appearance: alert, cooperative,  fatigued, and no distress Head: Normocephalic, without obvious abnormality, atraumatic Neck: no adenopathy, no JVD, supple, symmetrical, trachea midline, and thyroid not enlarged, symmetric, no tenderness/mass/nodules Lymph nodes: Cervical, supraclavicular, and axillary nodes normal. Resp: clear to auscultation bilaterally Back: symmetric, no curvature. ROM normal. No CVA tenderness. Cardio: regular rate and rhythm, S1, S2 normal, no murmur, click, rub or gallop GI: soft, non-tender; bowel sounds normal; no masses,  no organomegaly Extremities: extremities normal, atraumatic, no cyanosis or edema Neurologic: Alert and oriented X 3, normal strength and tone. Normal symmetric reflexes. Normal coordination and gait  ECOG PERFORMANCE STATUS: 1 - Symptomatic but completely ambulatory  Blood pressure (!) 109/56, pulse 69, temperature 97.6 F (36.4 C), temperature source Oral, resp. rate 17, weight 163 lb (73.9 kg), SpO2 100 %.  LABORATORY DATA: Lab Results  Component Value Date   WBC 5.1 05/21/2022   HGB 12.9 (L) 05/21/2022   HCT 36.9 (L) 05/21/2022   MCV 94.1 05/21/2022   PLT 177 05/21/2022      Chemistry      Component Value Date/Time   NA 138 04/30/2022 0928   NA 140 04/22/2022 0000   K 4.4 04/30/2022 0928   CL 105 04/30/2022 0928   CO2 25 04/30/2022 0928   BUN 32 (H) 04/30/2022 0928   BUN 21 04/22/2022 0000   CREATININE 1.41 (H) 04/30/2022 0928   CREATININE 1.09 02/06/2020 1306   GLU 88 04/22/2022 0000      Component Value Date/Time   CALCIUM 9.5 04/30/2022 0928   ALKPHOS 90 04/30/2022 0928   AST 20 04/30/2022 0928   ALT 26 04/30/2022 0928   BILITOT 0.7 04/30/2022 0928       RADIOGRAPHIC STUDIES: No results found.  ASSESSMENT AND PLAN: This is a very pleasant 82 years old white male diagnosed with a stage IIIa (T3a, N0, M0) papillary renal cell carcinoma with rhabdoid features diagnosed in June 2023 status post right open radical nephrectomy. The patient is  currently undergoing treatment with adjuvant immunotherapy with Keytruda 200 Mg IV every 3 weeks status post 8 cycles.  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 I recommended for the patient to continue his current treatment with [facility] and he will proceed with cycle #9 today. I will see him back for follow-up visit in 3 weeks for evaluation with repeat MRI of the abdomen for further evaluation of his disease. The patient was advised to call immediately if he has any other concerning symptoms in the interval. The patient voices understanding of current disease status and treatment options and is in agreement with the current care plan.  All questions were answered. The patient knows to call the clinic with any problems, questions or concerns. We can certainly see the patient much sooner if necessary.  The total time spent in the appointment was 40 minutes.  Disclaimer: This note was dictated with voice recognition software. Similar  sounding words can inadvertently be transcribed and may not be corrected upon review.

## 2022-05-21 NOTE — Patient Instructions (Signed)
[facility] ONCOLOGY  Discharge Instructions: Thank you for choosing [facility] to provide your oncology and hematology care.   If you have a lab appointment with the [facility], please go directly to the [facility] and check in at the registration area.   Wear comfortable clothing and clothing appropriate for easy access to any Portacath or PICC line.   We strive to give you quality time with your provider. You may need to reschedule your appointment if you arrive late (15 or more minutes).  Arriving late affects you and other patients whose appointments are after yours.  Also, if you miss three or more appointments without notifying the office, you may be dismissed from the clinic at the provider's discretion.      For prescription refill requests, have your pharmacy contact our office and allow 72 hours for refills to be completed.    Today you received the following chemotherapy and/or immunotherapy agents Beryle Flock      To help prevent nausea and vomiting after your treatment, we encourage you to take your nausea medication as directed.  BELOW ARE SYMPTOMS THAT SHOULD BE REPORTED IMMEDIATELY: *FEVER GREATER THAN 100.4 F (38 C) OR HIGHER *CHILLS OR SWEATING *NAUSEA AND VOMITING THAT IS NOT CONTROLLED WITH YOUR NAUSEA MEDICATION *UNUSUAL SHORTNESS OF BREATH *UNUSUAL BRUISING OR BLEEDING *URINARY PROBLEMS (pain or burning when urinating, or frequent urination) *BOWEL PROBLEMS (unusual diarrhea, constipation, pain near the anus) TENDERNESS IN MOUTH AND THROAT WITH OR WITHOUT PRESENCE OF ULCERS (sore throat, sores in mouth, or a toothache) UNUSUAL RASH, SWELLING OR PAIN  UNUSUAL VAGINAL DISCHARGE OR ITCHING   Items with * indicate a potential emergency and should be followed up as soon as possible or go to the Emergency Department if any problems should occur.  Please show the CHEMOTHERAPY ALERT CARD or IMMUNOTHERAPY ALERT CARD at check-in to  the Emergency Department and triage nurse.  Should you have questions after your visit or need to cancel or reschedule your appointment, please contact [facility]  Dept: [phone]  and follow the prompts.  Office hours are 8:00 a.m. to 4:30 p.m. Monday - Friday. Please note that voicemails left after 4:00 p.m. may not be returned until the following business day.  We are closed weekends and major holidays. You have access to a nurse at all times for urgent questions. Please call the main number to the clinic Dept: [phone] and follow the prompts.   For any non-urgent questions, you may also contact your provider using MyChart. We now offer e-Visits for anyone 23 and older to request care online for non-urgent symptoms. For details visit [email or web address].   Also download the MyChart app! Go to the app store, search "MyChart", open the app, select , and log in with your MyChart username and password.

## 2022-05-21 NOTE — Telephone Encounter (Signed)
Pharmacist said he has no record of pt receiving shingrix at [facility].

## 2022-05-22 LAB — T4: T4, Total: 6.2 ug/dL (ref 4.5–12.0)

## 2022-06-04 ENCOUNTER — Telehealth: Payer: Self-pay | Admitting: Internal Medicine

## 2022-06-04 NOTE — Telephone Encounter (Signed)
Called patient regarding upcoming February appointments, left a voicemail.

## 2022-06-09 ENCOUNTER — Ambulatory Visit (HOSPITAL_COMMUNITY)
Admission: RE | Admit: 2022-06-09 | Discharge: 2022-06-09 | Disposition: A | Discharge status: Home / Self Care | Payer: Medicare Other | Source: Ambulatory Visit | Attending: Internal Medicine | Admitting: Internal Medicine

## 2022-06-09 DIAGNOSIS — C641 Malignant neoplasm of right kidney, except renal pelvis: Secondary | ICD-10-CM | POA: Insufficient documentation

## 2022-06-09 MED ORDER — GADOBUTROL 1 MMOL/ML IV SOLN
7.0000 mL | Freq: Once | INTRAVENOUS | Status: AC | PRN
  Administered 2022-06-09: 7 mL via INTRAVENOUS

## 2022-06-11 ENCOUNTER — Other Ambulatory Visit (HOSPITAL_COMMUNITY): Payer: Self-pay

## 2022-06-11 ENCOUNTER — Other Ambulatory Visit: Payer: Self-pay

## 2022-06-11 ENCOUNTER — Telehealth: Payer: Self-pay | Admitting: Pharmacy Technician

## 2022-06-11 ENCOUNTER — Encounter: Payer: Self-pay | Admitting: Internal Medicine

## 2022-06-11 ENCOUNTER — Inpatient Hospital Stay: Payer: Medicare Other | Attending: Oncology | Admitting: Internal Medicine

## 2022-06-11 ENCOUNTER — Telehealth: Payer: Self-pay | Admitting: Pharmacist

## 2022-06-11 VITALS — BP 120/55 | HR 80 | Temp 97.6°F | Resp 16 | Wt 162.1 lb

## 2022-06-11 DIAGNOSIS — C787 Secondary malignant neoplasm of liver and intrahepatic bile duct: Secondary | ICD-10-CM | POA: Insufficient documentation

## 2022-06-11 DIAGNOSIS — Z791 Long term (current) use of non-steroidal anti-inflammatories (NSAID): Secondary | ICD-10-CM | POA: Diagnosis not present

## 2022-06-11 DIAGNOSIS — Z79899 Other long term (current) drug therapy: Secondary | ICD-10-CM | POA: Insufficient documentation

## 2022-06-11 DIAGNOSIS — M1990 Unspecified osteoarthritis, unspecified site: Secondary | ICD-10-CM | POA: Diagnosis not present

## 2022-06-11 DIAGNOSIS — R5383 Other fatigue: Secondary | ICD-10-CM | POA: Diagnosis not present

## 2022-06-11 DIAGNOSIS — C649 Malignant neoplasm of unspecified kidney, except renal pelvis: Secondary | ICD-10-CM

## 2022-06-11 DIAGNOSIS — K219 Gastro-esophageal reflux disease without esophagitis: Secondary | ICD-10-CM | POA: Insufficient documentation

## 2022-06-11 DIAGNOSIS — C7951 Secondary malignant neoplasm of bone: Secondary | ICD-10-CM | POA: Insufficient documentation

## 2022-06-11 DIAGNOSIS — Z5112 Encounter for antineoplastic immunotherapy: Secondary | ICD-10-CM | POA: Insufficient documentation

## 2022-06-11 DIAGNOSIS — C642 Malignant neoplasm of left kidney, except renal pelvis: Secondary | ICD-10-CM | POA: Insufficient documentation

## 2022-06-11 DIAGNOSIS — I10 Essential (primary) hypertension: Secondary | ICD-10-CM | POA: Insufficient documentation

## 2022-06-11 MED ORDER — CABOMETYX 40 MG PO TABS
40.0000 mg | ORAL_TABLET | Freq: Every day | ORAL | 3 refills | Status: DC
  Filled 2022-06-11 (×3): qty 30, 30d supply, fill #0
  Filled 2022-07-02: qty 30, 30d supply, fill #1
  Filled 2022-08-03: qty 30, 30d supply, fill #2
  Filled 2022-09-08: qty 30, 30d supply, fill #3

## 2022-06-11 NOTE — Telephone Encounter (Signed)
Oral Oncology Patient Advocate Encounter  Prior Authorization for Cabometyx has been approved.    PA# [id number] Effective dates: 05/12/22 through 06/11/23  Patients co-pay is $120.90.    Lady Deutscher, CPhT-Adv Oncology Pharmacy Patient [place] Direct Number: [phone]  Fax: [phone]

## 2022-06-11 NOTE — Telephone Encounter (Signed)
Oral Chemotherapy Pharmacist Encounter  I spoke with patient for overview of: Cabometyx for the treatment of metastatic papillary renal cell carcinoma, in conjunction with nivolumab, planned duration until disease progression or unacceptable toxicity.   Counseled patient on administration, dosing, side effects, monitoring, drug-food interactions, safe handling, storage, and disposal.  Patient will take Cabometyx '40mg'$  tablets, 1 tablet ('40mg'$ ) by mouth once daily on an empty stomach, 1 hour before or 2 hours after a meal.  Patient knows to avoid grapefruit and grapefruit juice.  Cabometyx start date: 06/18/22  Adverse effects include but are not limited to: diarrhea, nausea, decreased appetite, fatigue, hypertension, hand-foot syndrome, decreased blood counts, and electrolyte abnormalities. Patient will obtain anti diarrheal and alert the office of 4 or more loose stools above baseline.  Patient informed that Cabometyx should be held at least 3 weeks prior to any scheduled surgery (including dental surgery) and not resumed until at least 2 weeks after major surgery and until adequate wound healing is established.  Reviewed with patient importance of keeping a medication schedule and plan for any missed doses. No barriers to medication adherence identified.  Medication reconciliation performed and medication/allergy list updated.  All questions answered.  Micheal Mcintyre voiced understanding and appreciation.   Medication education handout placed in mail for patient. Patient knows to call the office with questions or concerns. Oral Chemotherapy Clinic phone number provided to patient.   Leron Croak, PharmD, BCPS, BCOP Hematology/Oncology Clinical Pharmacist [facility] [phone] 06/11/2022 4:02 PM

## 2022-06-11 NOTE — Telephone Encounter (Signed)
Oral Oncology Patient Advocate Encounter   Received notification that prior authorization for Cabometyx is required.   PA submitted on 06/11/22 Key [id number] Status is pending     Lady Deutscher, CPhT-Adv Oncology Pharmacy Patient [place] Direct Number: [phone]  Fax: [phone]

## 2022-06-11 NOTE — Progress Notes (Signed)
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  PRIOR THERAPY:  1) Status post right open radical nephrectomy completed on October 15, 2021 by Dr. Rexene Alberts.  2) Keytruda 200 Mg IV every 3 weeks started December 03, 2021.  Status post 9 cycles.  Last dose was given May 21, 2022.  This was discontinued secondary to disease progression  CURRENT THERAPY: Systemic treatment with nivolumab 480 Mg IV every 4 weeks in addition to Cabometyx 40 mg p.o. daily.  First dose June 18, 2022   INTERVAL HISTORY: Micheal Mcintyre 82 y.o. male returns to clinic today for follow-up visit.  The patient is feeling fine today with no concerning complaints except for fatigue.  He denied having any current chest pain, shortness of breath, cough or hemoptysis.  He continues to have the carpal tunnel symptoms in his left hand.  He denied having any nausea, vomiting, diarrhea or constipation.  He has no headache or visual changes.  He has been tolerating his previous treatment with Keytruda fairly well.  He had repeat MRI of the abdomen and pelvis performed recently and is here for evaluation and discussion of his imaging studies and treatment options.  MEDICAL HISTORY: Past Medical History:  Diagnosis Date   Arthritis    GERD (gastroesophageal reflux disease)    Hypertension    Medicare annual wellness visit, subsequent 08/19/2014   Osteoarthritis 09/21/2007   Qualifier: Diagnosis of  By: Niel Hummer MD, Izell Ganado R    PONV (postoperative nausea and vomiting)    Preventative health care 06/07/2016   Right shoulder pain 12/03/2016    ALLERGIES:  is allergic to statins.  MEDICATIONS:   Current Outpatient Medications  Medication Sig Dispense Refill   acetaminophen (TYLENOL) 500 MG tablet Take 1,000 mg by mouth every 6 (six) hours as needed for moderate pain.     aluminum hydroxide-magnesium carbonate (GAVISCON) 95-358 MG/15ML SUSP Take 15 mLs by mouth as needed for heartburn or indigestion.     amLODipine (NORVASC) 5 MG tablet Take 5 mg by mouth daily.     Cholecalciferol (VITAMIN D3) 10 MCG (400 UNIT) tablet Take 800 Units by mouth daily.     diclofenac Sodium (VOLTAREN) 1 % GEL Apply 1 application. topically 4 (four) times daily as needed (pain).     ferrous sulfate 325 (65 FE) MG tablet Take 325 mg by mouth 4 (four) times a week.     ibuprofen (ADVIL) 200 MG tablet Take 200 mg by mouth daily.     Multiple Vitamin (MULTIVITAMIN WITH MINERALS) TABS tablet Take 1 tablet by mouth 4 (four) times a week.     Omega-3 350 MG CPDR Take 350 mg by mouth daily.     rosuvastatin (CRESTOR) 20 MG tablet Take 0.5 tablets (10 mg total) by mouth daily. 90 tablet 3   RSV vaccine recomb adjuvanted (AREXVY) 120 MCG/0.5ML injection Inject into the muscle. 0.5 mL 0   telmisartan (MICARDIS) 80 MG tablet Take 0.5 tablets (40 mg total) by mouth 2 (two) times daily. 180 tablet 1   TURMERIC PO Take 1 tablet by mouth 3 (three) times a week.     No current facility-administered medications  for this visit.    SURGICAL HISTORY:  Past Surgical History:  Procedure Laterality Date   CHOLECYSTECTOMY     HIP SURGERY     JOINT REPLACEMENT     NEPHRECTOMY Right 10/15/2021   Procedure: NEPHRECTOMY, OPEN;  Surgeon: Janith Lima, MD;  Location: [facility];  Service: Urology;  Laterality: Right;   nose skin cancer removal     TOTAL HIP ARTHROPLASTY      REVIEW OF SYSTEMS:  Constitutional: positive for fatigue Eyes: negative Ears, nose, mouth, throat, and face: negative Respiratory: negative Cardiovascular: negative Gastrointestinal: negative Genitourinary:negative Integument/breast: positive for  pruritus Hematologic/lymphatic: negative Musculoskeletal:positive for arthralgias Neurological: negative Behavioral/Psych: negative Endocrine: negative Allergic/Immunologic: negative   PHYSICAL EXAMINATION: General appearance: alert, cooperative, fatigued, and no distress Head: Normocephalic, without obvious abnormality, atraumatic Neck: no adenopathy, no JVD, supple, symmetrical, trachea midline, and thyroid not enlarged, symmetric, no tenderness/mass/nodules Lymph nodes: Cervical, supraclavicular, and axillary nodes normal. Resp: clear to auscultation bilaterally Back: symmetric, no curvature. ROM normal. No CVA tenderness. Cardio: regular rate and rhythm, S1, S2 normal, no murmur, click, rub or gallop GI: soft, non-tender; bowel sounds normal; no masses,  no organomegaly Extremities: extremities normal, atraumatic, no cyanosis or edema Neurologic: Alert and oriented X 3, normal strength and tone. Normal symmetric reflexes. Normal coordination and gait  ECOG PERFORMANCE STATUS: 1 - Symptomatic but completely ambulatory  Blood pressure (!) 120/55, pulse 80, temperature 97.6 F (36.4 C), temperature source Oral, resp. rate 16, weight 162 lb 1.6 oz (73.5 kg), SpO2 100 %.  LABORATORY DATA: Lab Results  Component Value Date   WBC 5.1 05/21/2022   HGB 12.9 (L) 05/21/2022   HCT 36.9 (L) 05/21/2022   MCV 94.1 05/21/2022   PLT 177 05/21/2022      Chemistry      Component Value Date/Time   NA 137 05/21/2022 0921   NA 140 04/22/2022 0000   K 4.5 05/21/2022 0921   CL 105 05/21/2022 0921   CO2 24 05/21/2022 0921   BUN 26 (H) 05/21/2022 0921   BUN 21 04/22/2022 0000   CREATININE 1.56 (H) 05/21/2022 0921   CREATININE 1.09 02/06/2020 1306   GLU 88 04/22/2022 0000      Component Value Date/Time   CALCIUM 9.5 05/21/2022 0921   ALKPHOS 92 05/21/2022 0921   AST 20 05/21/2022 0921   ALT 24 05/21/2022 0921   BILITOT 0.5 05/21/2022 0921       RADIOGRAPHIC STUDIES: MR Abdomen W  Wo Contrast  Result Date: 06/09/2022 CLINICAL DATA:  Renal cell carcinoma, status post right nephrectomy EXAM: MRI ABDOMEN WITHOUT AND WITH CONTRAST TECHNIQUE: Multiplanar multisequence MR imaging of the abdomen was performed both before and after the administration of intravenous contrast. CONTRAST:  57m GADAVIST GADOBUTROL 1 MMOL/ML IV SOLN COMPARISON:  MR abdomen, 02/03/2022, CT abdomen pelvis, 09/01/2021 FINDINGS: Lower chest: No acute abnormality. Hepatobiliary: Multiple new T2 hyperintense, rim hypoenhancing lesions throughout the liver, including of the anterior right lobe of the liver, hepatic segment II 8, measuring 2.7 x 2.5 cm (series 13, image 23), and of the inferior right lobe of the liver, hepatic segment V/VI measuring 2.2 x 1.8 cm (series 13, image 42). No gallstones, gallbladder wall thickening, or biliary dilatation. Pancreas: Unremarkable. No pancreatic ductal dilatation or surrounding inflammatory changes. Spleen: Normal in size without significant abnormality. Adrenals/Urinary Tract: Adrenal glands are unremarkable. Status post right nephrectomy. No suspicious soft tissue or contrast enhancement in the nephrectomy bed. The left kidney is normal, without renal calculi, solid  lesion, or hydronephrosis. Stomach/Bowel: Stomach is within normal limits. No evidence of bowel wall thickening, distention, or inflammatory changes. Vascular/Lymphatic: No significant vascular findings are present. No enlarged abdominal lymph nodes. Other: No abdominal wall hernia or abnormality. No ascites. Musculoskeletal: Rim enhancing lesion of the inferior endplate L1 measuring 1.2 x 1.1 cm (series 11, image 50). IMPRESSION: 1. Multiple new rim hypoenhancing lesions throughout the liver, consistent with metastatic disease. 2. Rim enhancing lesion of the inferior endplate L1 measuring 1.2 x 1.1 cm, concerning for metastatic disease. 3. Status post right nephrectomy. No evidence of local recurrence or lymphadenopathy  Electronically Signed   By: Delanna Ahmadi M.D.   On: 06/09/2022 23:05    ASSESSMENT AND PLAN: This is a very pleasant 82 years old white male diagnosed with metastatic papillary renal cell carcinoma initially diagnosed as stage IIIa (T3a, N0, M0) papillary renal cell carcinoma with rhabdoid features diagnosed in June 2023 status post right open radical nephrectomy.  The patient has evidence for metastatic disease to the liver and bone in February 2024. The patient underwent treatment with adjuvant immunotherapy with Keytruda 200 Mg IV every 3 weeks status post 9 cycles.   He had MRI of the abdomen and pelvis performed recently.  I personally and independently reviewed the imaging studies and discussed the result with the patient today. Unfortunately his MRI showed multiple new rim-hypoenhancing lesions throughout the liver consistent with metastatic disease.  There was also a rim enhancing lesion of the inferior endplate of L1 measuring 1.2 x 1.1 cm concerning for metastatic disease.  There is no evidence of local recurrence or lymphadenopathy. I had a lengthy discussion with the patient today about his condition and treatment options. I recommended for the patient to discontinue his current adjuvant treatment with [facility] because of lack of efficacy and the disease progression. I discussed with the patient other treatment options for what is now consider metastatic renal cell carcinoma.  I gave the patient the option of palliative care versus consideration of palliative treatment with a combination of nivolumab 480 Mg IV every 4 weeks in addition to Cabometyx 40 mg p.o. daily.  He is interested in this treatment.  I discussed with him the adverse effect of the combination treatment and he will meet with the pharmacist for oral oncolytic for education and also to help the patient with the refill of Cabometyx. He is expected to start the first dose of nivolumab next week. He will come back for follow-up  visit in 2 weeks for evaluation and management of any adverse effect of his treatment. The patient has several questions and I answered them completely to his satisfaction. He was advised to call immediately if he has any other concerning symptoms in the interval. The patient voices understanding of current disease status and treatment options and is in agreement with the current care plan.  All questions were answered. The patient knows to call the clinic with any problems, questions or concerns. We can certainly see the patient much sooner if necessary.  The total time spent in the appointment was 55 minutes.  Disclaimer: This note was dictated with voice recognition software. Similar sounding words can inadvertently be transcribed and may not be corrected upon review.

## 2022-06-11 NOTE — Telephone Encounter (Signed)
Oral Oncology Pharmacist Encounter  Received new prescription for Cabometyx (cabozantinib) for the treatment of metastatic renal cell carcinoma in conjunction with nivolumab, planned duration until disease progression or unacceptable drug toxicity.  CBC w/ Diff and CMP from 05/21/22 assessed, noted pt with Scr of 1.56 mg/dL (CrCl ~39.9 mL/min). No renal dose adjustments required. Patient BP is 120/55 mmHg in clinic on 06/11/22. TSH and T4 from 05/21/22 WNL. Prescription dose and frequency assessed for appropriateness. Appropriate for therapy initiation.   Current medication list in Epic reviewed, no relevant/significant DDIs with Cabometyx identified.  Evaluated chart and no patient barriers to medication adherence noted.   Patient agreement for treatment documented in MD note on 06/11/22.  Prescription has been e-scribed to the [facility] ([facility]) for benefits analysis and approval.  Oral Oncology Clinic will continue to follow for insurance authorization, copayment issues, initial counseling and start date.  Leron Croak, PharmD, BCPS, [facility] Hematology/Oncology Clinical Pharmacist [facility] and [place] [phone] 06/11/2022 11:31 AM

## 2022-06-11 NOTE — Progress Notes (Signed)
DISCONTINUE ON PATHWAY REGIMEN - Renal Cell     A cycle is every 21 days:     Pembrolizumab   **Always confirm dose/schedule in your pharmacy ordering system**  REASON: Disease Progression PRIOR TREATMENT: [id number]: Pembrolizumab 200 mg q21 Days for up to 1 Year TREATMENT RESPONSE: Stable Disease (SD)  START OFF PATHWAY REGIMEN - Renal Cell   [id number] (Cabometyx) 40 mg PO Daily D1-28 + Nivolumab 480 mg IV D1 q28 Days:   A cycle is every 28 days:     Cabozantinib (tablet)      Nivolumab   **Always confirm dose/schedule in your pharmacy ordering system**  Patient Characteristics: Stage IV (Unresected T4M0 or Any T, M1)/Metastatic Disease, Non-Clear Cell, First Line Therapeutic Status: Stage IV (Unresected T4M0 or Any T, M1)/Metastatic Disease Histology: Non-Clear Cell Line of therapy: First Line Intent of Therapy: Non-Curative / Palliative Intent, Discussed with Patient

## 2022-06-12 ENCOUNTER — Telehealth: Payer: Self-pay | Admitting: Internal Medicine

## 2022-06-12 NOTE — Telephone Encounter (Signed)
Scheduled per 02/08 los, patient has been called and notified of upcoming appointments.

## 2022-06-16 ENCOUNTER — Encounter: Payer: Self-pay | Admitting: Family Medicine

## 2022-06-16 ENCOUNTER — Ambulatory Visit (INDEPENDENT_AMBULATORY_CARE_PROVIDER_SITE_OTHER): Payer: Medicare Other | Admitting: Family Medicine

## 2022-06-16 ENCOUNTER — Telehealth: Payer: Self-pay | Admitting: Internal Medicine

## 2022-06-16 VITALS — BP 118/62 | Ht 70.0 in | Wt 162.0 lb

## 2022-06-16 DIAGNOSIS — G5602 Carpal tunnel syndrome, left upper limb: Secondary | ICD-10-CM | POA: Diagnosis not present

## 2022-06-16 NOTE — Telephone Encounter (Signed)
Called patient regarding upcoming appointments, patient is notified. 

## 2022-06-16 NOTE — Patient Instructions (Signed)
Good to see you Please continue the exercises  Please use the braces at night   Please send me a message in MyChart with any questions or updates.  Please see me back as needed.   --Dr. Raeford Razor

## 2022-06-16 NOTE — Assessment & Plan Note (Signed)
Doing well with the modifications in place. His right has become more symptomatic as of late.  - counseled on home exercise therapy and supportive care - could consider injection or PT

## 2022-06-16 NOTE — Progress Notes (Signed)
  Micheal Mcintyre - 82 y.o. male MRN [id number]  Date of birth: 05/07/1940  SUBJECTIVE:  Including CC & ROS.  No chief complaint on file.   Micheal Mcintyre is a 82 y.o. male that is  following up for his carpal tunnel syndrome. He feels improvement with his left. His right has been more symptomatic.    Review of Systems See HPI   HISTORY: Past Medical, Surgical, Social, and Family History Reviewed & Updated per EMR.   Pertinent Historical Findings include:  Past Medical History:  Diagnosis Date   Arthritis    GERD (gastroesophageal reflux disease)    Hypertension    Medicare annual wellness visit, subsequent 08/19/2014   Osteoarthritis 09/21/2007   Qualifier: Diagnosis of  By: Niel Hummer MD, Izell Holloway R    PONV (postoperative nausea and vomiting)    Preventative health care 06/07/2016   Right shoulder pain 12/03/2016    Past Surgical History:  Procedure Laterality Date   CHOLECYSTECTOMY     HIP SURGERY     JOINT REPLACEMENT     NEPHRECTOMY Right 10/15/2021   Procedure: NEPHRECTOMY, OPEN;  Surgeon: Janith Lima, MD;  Location: [facility];  Service: Urology;  Laterality: Right;   nose skin cancer removal     TOTAL HIP ARTHROPLASTY       PHYSICAL EXAM:  VS: BP 118/62   Ht '5\' 10"'$  (1.778 m)   Wt 162 lb (73.5 kg)   BMI 23.24 kg/m  Physical Exam Gen: NAD, alert, cooperative with exam, well-appearing MSK:  Neurovascularly intact       ASSESSMENT & PLAN:   Carpal tunnel syndrome, left Doing well with the modifications in place. His right has become more symptomatic as of late.  - counseled on home exercise therapy and supportive care - could consider injection or PT

## 2022-06-17 ENCOUNTER — Other Ambulatory Visit: Payer: Self-pay

## 2022-06-18 ENCOUNTER — Inpatient Hospital Stay: Payer: Medicare Other

## 2022-06-18 ENCOUNTER — Other Ambulatory Visit: Payer: Self-pay

## 2022-06-18 VITALS — BP 134/66 | HR 66 | Temp 97.8°F | Resp 17 | Wt 162.0 lb

## 2022-06-18 DIAGNOSIS — C649 Malignant neoplasm of unspecified kidney, except renal pelvis: Secondary | ICD-10-CM

## 2022-06-18 DIAGNOSIS — C642 Malignant neoplasm of left kidney, except renal pelvis: Secondary | ICD-10-CM | POA: Diagnosis not present

## 2022-06-18 LAB — CBC WITH DIFFERENTIAL (CANCER CENTER ONLY)
Abs Immature Granulocytes: 0.01 10*3/uL (ref 0.00–0.07)
Basophils Absolute: 0 10*3/uL (ref 0.0–0.1)
Basophils Relative: 1 %
Eosinophils Absolute: 0 10*3/uL (ref 0.0–0.5)
Eosinophils Relative: 1 %
HCT: 35.5 % — ABNORMAL LOW (ref 39.0–52.0)
Hemoglobin: 12 g/dL — ABNORMAL LOW (ref 13.0–17.0)
Immature Granulocytes: 0 %
Lymphocytes Relative: 17 %
Lymphs Abs: 1 10*3/uL (ref 0.7–4.0)
MCH: 32.5 pg (ref 26.0–34.0)
MCHC: 33.8 g/dL (ref 30.0–36.0)
MCV: 96.2 fL (ref 80.0–100.0)
Monocytes Absolute: 0.5 10*3/uL (ref 0.1–1.0)
Monocytes Relative: 8 %
Neutro Abs: 4.3 10*3/uL (ref 1.7–7.7)
Neutrophils Relative %: 73 %
Platelet Count: 172 10*3/uL (ref 150–400)
RBC: 3.69 MIL/uL — ABNORMAL LOW (ref 4.22–5.81)
RDW: 12.7 % (ref 11.5–15.5)
WBC Count: 5.8 10*3/uL (ref 4.0–10.5)
nRBC: 0 % (ref 0.0–0.2)

## 2022-06-18 LAB — CMP (CANCER CENTER ONLY)
ALT: 17 U/L (ref 0–44)
AST: 15 U/L (ref 15–41)
Albumin: 4.2 g/dL (ref 3.5–5.0)
Alkaline Phosphatase: 98 U/L (ref 38–126)
Anion gap: 7 (ref 5–15)
BUN: 30 mg/dL — ABNORMAL HIGH (ref 8–23)
CO2: 26 mmol/L (ref 22–32)
Calcium: 9.2 mg/dL (ref 8.9–10.3)
Chloride: 105 mmol/L (ref 98–111)
Creatinine: 1.67 mg/dL — ABNORMAL HIGH (ref 0.61–1.24)
GFR, Estimated: 41 mL/min — ABNORMAL LOW (ref >60)
Glucose, Bld: 114 mg/dL — ABNORMAL HIGH (ref 70–99)
Potassium: 4.8 mmol/L (ref 3.5–5.1)
Sodium: 138 mmol/L (ref 135–145)
Total Bilirubin: 0.5 mg/dL (ref 0.3–1.2)
Total Protein: 6.7 g/dL (ref 6.5–8.1)

## 2022-06-18 MED ORDER — SODIUM CHLORIDE 0.9 % IV SOLN: Freq: Once | INTRAVENOUS | Status: AC

## 2022-06-18 MED ORDER — SODIUM CHLORIDE 0.9 % IV SOLN
480.0000 mg | Freq: Once | INTRAVENOUS | Status: AC
  Administered 2022-06-18: 480 mg via INTRAVENOUS
  Filled 2022-06-18: qty 48

## 2022-06-18 NOTE — Progress Notes (Signed)
Ok to treat with creat 1.67 mg/dL per Dr Julien Nordmann

## 2022-06-18 NOTE — Patient Instructions (Addendum)
[facility]  Discharge Instructions: Thank you for choosing [facility] to provide your oncology and hematology care.   If you have a lab appointment with the [facility], please go directly to the [facility] and check in at the registration area.   Wear comfortable clothing and clothing appropriate for easy access to any Portacath or PICC line.   We strive to give you quality time with your provider. You may need to reschedule your appointment if you arrive late (15 or more minutes).  Arriving late affects you and other patients whose appointments are after yours.  Also, if you miss three or more appointments without notifying the office, you may be dismissed from the clinic at the provider's discretion.      For prescription refill requests, have your pharmacy contact our office and allow 72 hours for refills to be completed.    Today you received the following chemotherapy and/or immunotherapy agents: Opdivo      To help prevent nausea and vomiting after your treatment, we encourage you to take your nausea medication as directed.  BELOW ARE SYMPTOMS THAT SHOULD BE REPORTED IMMEDIATELY: *FEVER GREATER THAN 100.4 F (38 C) OR HIGHER *CHILLS OR SWEATING *NAUSEA AND VOMITING THAT IS NOT CONTROLLED WITH YOUR NAUSEA MEDICATION *UNUSUAL SHORTNESS OF BREATH *UNUSUAL BRUISING OR BLEEDING *URINARY PROBLEMS (pain or burning when urinating, or frequent urination) *BOWEL PROBLEMS (unusual diarrhea, constipation, pain near the anus) TENDERNESS IN MOUTH AND THROAT WITH OR WITHOUT PRESENCE OF ULCERS (sore throat, sores in mouth, or a toothache) UNUSUAL RASH, SWELLING OR PAIN  UNUSUAL VAGINAL DISCHARGE OR ITCHING   Items with * indicate a potential emergency and should be followed up as soon as possible or go to the Emergency Department if any problems should occur.  Please show the CHEMOTHERAPY ALERT CARD or IMMUNOTHERAPY ALERT CARD at check-in  to the Emergency Department and triage nurse.  Should you have questions after your visit or need to cancel or reschedule your appointment, please contact [facility]  Dept: [phone]  and follow the prompts.  Office hours are 8:00 a.m. to 4:30 p.m. Monday - Friday. Please note that voicemails left after 4:00 p.m. may not be returned until the following business day.  We are closed weekends and major holidays. You have access to a nurse at all times for urgent questions. Please call the main number to the clinic Dept: [phone] and follow the prompts.   For any non-urgent questions, you may also contact your provider using MyChart. We now offer e-Visits for anyone 93 and older to request care online for non-urgent symptoms. For details visit [email or web address].   Also download the MyChart app! Go to the app store, search "MyChart", open the app, select [facility], and log in with your MyChart username and password.  Nivolumab Injection What is this medication? NIVOLUMAB (nye VOL ue mab) treats some types of cancer. It works by helping your immune system slow or stop the spread of cancer cells. It is a monoclonal antibody. This medicine may be used for other purposes; ask your health care provider or pharmacist if you have questions. COMMON BRAND NAME(S): Opdivo What should I tell my care team before I take this medication? They need to know if you have any of these conditions: Allogeneic stem cell transplant (uses someone else's stem cells) Autoimmune diseases, such as Crohn disease, ulcerative colitis, lupus History of chest radiation Nervous system  problems, such as Guillain-Barre syndrome or myasthenia gravis Organ transplant An unusual or allergic reaction to nivolumab, other medications, foods, dyes, or preservatives Pregnant or trying to get pregnant Breast-feeding How should I use this medication? This medication is infused into a vein.  It is given in a hospital or clinic setting. A special MedGuide will be given to you before each treatment. Be sure to read this information carefully each time. Talk to your care team about the use of this medication in children. While it may be prescribed for children as young as 12 years for selected conditions, precautions do apply. Overdosage: If you think you have taken too much of this medicine contact a poison control center or emergency room at once. NOTE: This medicine is only for you. Do not share this medicine with others. What if I miss a dose? Keep appointments for follow-up doses. It is important not to miss your dose. Call your care team if you are unable to keep an appointment. What may interact with this medication? Interactions have not been studied. This list may not describe all possible interactions. Give your health care provider a list of all the medicines, herbs, non-prescription drugs, or dietary supplements you use. Also tell them if you smoke, drink alcohol, or use illegal drugs. Some items may interact with your medicine. What should I watch for while using this medication? Your condition will be monitored carefully while you are receiving this medication. You may need blood work while taking this medication. This medication may cause serious skin reactions. They can happen weeks to months after starting the medication. Contact your care team right away if you notice fevers or flu-like symptoms with a rash. The rash may be red or purple and then turn into blisters or peeling of the skin. You may also notice a red rash with swelling of the face, lips, or lymph nodes in your neck or under your arms. Tell your care team right away if you have any change in your eyesight. Talk to your care team if you are pregnant or think you might be pregnant. A negative pregnancy test is required before starting this medication. A reliable form of contraception is recommended while taking  this medication and for 5 months after the last dose. Talk to your care team about effective forms of contraception. Do not breast-feed while taking this medication and for 5 months after the last dose. What side effects may I notice from receiving this medication? Side effects that you should report to your care team as soon as possible: Allergic reactions--skin rash, itching, hives, swelling of the face, lips, tongue, or throat Dry cough, shortness of breath or trouble breathing Eye pain, redness, irritation, or discharge with blurry or decreased vision Heart muscle inflammation--unusual weakness or fatigue, shortness of breath, chest pain, fast or irregular heartbeat, dizziness, swelling of the ankles, feet, or hands Hormone gland problems--headache, sensitivity to light, unusual weakness or fatigue, dizziness, fast or irregular heartbeat, increased sensitivity to cold or heat, excessive sweating, constipation, hair loss, increased thirst or amount of urine, tremors or shaking, irritability Infusion reactions--chest pain, shortness of breath or trouble breathing, feeling faint or lightheaded Kidney injury (glomerulonephritis)--decrease in the amount of urine, red or dark brown urine, foamy or bubbly urine, swelling of the ankles, hands, or feet Liver injury--right upper belly pain, loss of appetite, nausea, light-colored stool, dark yellow or brown urine, yellowing skin or eyes, unusual weakness or fatigue Pain, tingling, or numbness in the hands or feet,  change in vision, confusion or trouble speaking, loss of balance or coordination, trouble walking, seizures Rash, fever, and swollen lymph nodes Redness, blistering, peeling, or loosening of the skin, including inside the mouth Sudden or severe stomach pain, bloody diarrhea, fever, nausea, vomiting Side effects that usually do not require medical attention (report these to your care team if they continue or are  bothersome): Bone, joint, or muscle pain Diarrhea Fatigue Loss of appetite Nausea Skin rash This list may not describe all possible side effects. Call your doctor for medical advice about side effects. You may report side effects to FDA at 1-800-FDA-1088. Where should I keep my medication? This medication is given in a hospital or clinic. It will not be stored at home. NOTE: This sheet is a summary. It may not cover all possible information. If you have questions about this medicine, talk to your doctor, pharmacist, or health care provider.  2023 Elsevier/Gold Standard (2021-08-18 00:00:00)    

## 2022-06-19 ENCOUNTER — Telehealth: Payer: Self-pay | Admitting: *Deleted

## 2022-06-19 LAB — TSH: TSH: 1.111 u[IU]/mL (ref 0.350–4.500)

## 2022-06-19 LAB — T4: T4, Total: 5.7 ug/dL (ref 4.5–12.0)

## 2022-06-19 NOTE — Telephone Encounter (Signed)
Called pt to see how he did with his recent treatment.  He reports doing well & is very pleased.  He denies any problems/ concerns.  He states he has an appt on Mon & knows how to reach [place] if needed.

## 2022-06-19 NOTE — Telephone Encounter (Signed)
-----   Message from Charleston Poot, RN sent at 06/18/2022  4:27 PM EST ----- Regarding: first time/ opdivo/ Dr Julien Nordmann pt Pt had first time opdivo today. Tolerated well.  Thanks!

## 2022-06-19 NOTE — Progress Notes (Unsigned)
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   PRIOR THERAPY: 1) Status post right open radical nephrectomy completed on October 15, 2021 by Dr. Rexene Alberts.  2) Keytruda 200 Mg IV every 3 weeks started December 03, 2021.  Status post 9 cycles.  Last dose was given May 21, 2022.  This was discontinued secondary to disease progression  CURRENT THERAPY: Systemic treatment with nivolumab 480 Mg IV every 4 weeks in addition to Cabometyx 40 mg p.o. daily. First dose June 18, 2022   INTERVAL HISTORY: Micheal Mcintyre 82 y.o. male returns to the clinic today for follow-up visit.  The patient was last seen by Dr. Julien Nordmann on 06/11/2022.  At that time he had some evidence of disease progression; therefore, Dr. Julien Nordmann added cabometyx which he started on 06/18/22. Thus far, he is tolerating well. Otherwise, he is feeling well. Fatigue.  He denies any fever, chills, night sweats, or unexplained weight loss.  Bone pain?  He denies any rashes or skin changes.  Denies any chest pain, shortness of breath, cough, or hemoptysis.  Denies any nausea, vomiting, diarrhea, or constipation.  Denies any headache or visual changes.  Fatigue?  Hypertension?  Rashes or skin changes?  Tach changes.  He is here today for evaluation repeat blood work before undergoing his first cycle. ***Or one week follow up  MEDICAL HISTORY: Past Medical History:  Diagnosis Date   Arthritis    GERD (gastroesophageal reflux disease)    Hypertension    Medicare annual wellness visit, subsequent 08/19/2014   Osteoarthritis 09/21/2007   Qualifier: Diagnosis of  By: Niel Hummer MD, Izell Oracle R    PONV (postoperative nausea and vomiting)    Preventative health care 06/07/2016    Right shoulder pain 12/03/2016    ALLERGIES:  is allergic to statins.  MEDICATIONS:  Current Outpatient Medications  Medication Sig Dispense Refill   acetaminophen (TYLENOL) 500 MG tablet Take 1,000 mg by mouth every 6 (six) hours as needed for moderate pain.     aluminum hydroxide-magnesium carbonate (GAVISCON) 95-358 MG/15ML SUSP Take 15 mLs by mouth as needed for heartburn or indigestion.     amLODipine (NORVASC) 5 MG tablet Take 5 mg by mouth daily.     cabozantinib (CABOMETYX) 40 MG tablet Take 1 tablet (40 mg total) by mouth daily. Take on an empty stomach, 1 hour before or 2 hours after meals. 30 tablet 3   Cholecalciferol (VITAMIN D3) 10 MCG (400 UNIT) tablet Take 800 Units by mouth daily.     diclofenac Sodium (VOLTAREN) 1 % GEL Apply 1 application. topically 4 (four) times daily as needed (pain).     ferrous sulfate 325 (65 FE) MG tablet Take 325 mg by mouth 4 (four) times a week.     ibuprofen (ADVIL) 200 MG tablet Take 200 mg by mouth daily.     Multiple Vitamin (MULTIVITAMIN WITH MINERALS) TABS tablet Take 1 tablet by mouth 4 (four) times a week.     Omega-3 350 MG CPDR Take 350 mg by mouth daily.     rosuvastatin (CRESTOR) 20 MG tablet Take 0.5 tablets (10 mg total) by mouth daily. 90 tablet 3   RSV vaccine recomb adjuvanted (AREXVY) 120 MCG/0.5ML injection Inject into  the muscle. 0.5 mL 0   telmisartan (MICARDIS) 80 MG tablet Take 0.5 tablets (40 mg total) by mouth 2 (two) times daily. 180 tablet 1   TURMERIC PO Take 1 tablet by mouth 3 (three) times a week.     No current facility-administered medications for this visit.    SURGICAL HISTORY:  Past Surgical History:  Procedure Laterality Date   CHOLECYSTECTOMY     HIP SURGERY     JOINT REPLACEMENT     NEPHRECTOMY Right 10/15/2021   Procedure: NEPHRECTOMY, OPEN;  Surgeon: Janith Lima, MD;  Location: [facility];  Service: Urology;  Laterality: Right;   nose skin cancer removal     TOTAL HIP ARTHROPLASTY      REVIEW  OF SYSTEMS:   Review of Systems  Constitutional: Negative for appetite change, chills, fatigue, fever and unexpected weight change.  HENT:   Negative for mouth sores, nosebleeds, sore throat and trouble swallowing.   Eyes: Negative for eye problems and icterus.  Respiratory: Negative for cough, hemoptysis, shortness of breath and wheezing.   Cardiovascular: Negative for chest pain and leg swelling.  Gastrointestinal: Negative for abdominal pain, constipation, diarrhea, nausea and vomiting.  Genitourinary: Negative for bladder incontinence, difficulty urinating, dysuria, frequency and hematuria.   Musculoskeletal: Negative for back pain, gait problem, neck pain and neck stiffness.  Skin: Negative for itching and rash.  Neurological: Negative for dizziness, extremity weakness, gait problem, headaches, light-headedness and seizures.  Hematological: Negative for adenopathy. Does not bruise/bleed easily.  Psychiatric/Behavioral: Negative for confusion, depression and sleep disturbance. The patient is not nervous/anxious.     PHYSICAL EXAMINATION:  There were no vitals taken for this visit.  ECOG PERFORMANCE STATUS: {CHL ONC ECOG [id number]  Physical Exam  Constitutional: Oriented to person, place, and time and well-developed, well-nourished, and in no distress. No distress.  HENT:  Head: Normocephalic and atraumatic.  Mouth/Throat: Oropharynx is clear and moist. No oropharyngeal exudate.  Eyes: Conjunctivae are normal. Right eye exhibits no discharge. Left eye exhibits no discharge. No scleral icterus.  Neck: Normal range of motion. Neck supple.  Cardiovascular: Normal rate, regular rhythm, normal heart sounds and intact distal pulses.   Pulmonary/Chest: Effort normal and breath sounds normal. No respiratory distress. No wheezes. No rales.  Abdominal: Soft. Bowel sounds are normal. Exhibits no distension and no mass. There is no tenderness.  Musculoskeletal: Normal range of motion.  Exhibits no edema.  Lymphadenopathy:    No cervical adenopathy.  Neurological: Alert and oriented to person, place, and time. Exhibits normal muscle tone. Gait normal. Coordination normal.  Skin: Skin is warm and dry. No rash noted. Not diaphoretic. No erythema. No pallor.  Psychiatric: Mood, memory and judgment normal.  Vitals reviewed.  LABORATORY DATA: Lab Results  Component Value Date   WBC 5.8 06/18/2022   HGB 12.0 (L) 06/18/2022   HCT 35.5 (L) 06/18/2022   MCV 96.2 06/18/2022   PLT 172 06/18/2022      Chemistry      Component Value Date/Time   NA 138 06/18/2022 1404   NA 140 04/22/2022 0000   K 4.8 06/18/2022 1404   CL 105 06/18/2022 1404   CO2 26 06/18/2022 1404   BUN 30 (H) 06/18/2022 1404   BUN 21 04/22/2022 0000   CREATININE 1.67 (H) 06/18/2022 1404   CREATININE 1.09 02/06/2020 1306   GLU 88 04/22/2022 0000      Component Value Date/Time   CALCIUM 9.2 06/18/2022 1404   ALKPHOS 98 06/18/2022 1404  AST 15 06/18/2022 1404   ALT 17 06/18/2022 1404   BILITOT 0.5 06/18/2022 1404       RADIOGRAPHIC STUDIES:  MR Abdomen W Wo Contrast  Result Date: 06/09/2022 CLINICAL DATA:  Renal cell carcinoma, status post right nephrectomy EXAM: MRI ABDOMEN WITHOUT AND WITH CONTRAST TECHNIQUE: Multiplanar multisequence MR imaging of the abdomen was performed both before and after the administration of intravenous contrast. CONTRAST:  73m GADAVIST GADOBUTROL 1 MMOL/ML IV SOLN COMPARISON:  MR abdomen, 02/03/2022, CT abdomen pelvis, 09/01/2021 FINDINGS: Lower chest: No acute abnormality. Hepatobiliary: Multiple new T2 hyperintense, rim hypoenhancing lesions throughout the liver, including of the anterior right lobe of the liver, hepatic segment II 8, measuring 2.7 x 2.5 cm (series 13, image 23), and of the inferior right lobe of the liver, hepatic segment V/VI measuring 2.2 x 1.8 cm (series 13, image 42). No gallstones, gallbladder wall thickening, or biliary dilatation. Pancreas:  Unremarkable. No pancreatic ductal dilatation or surrounding inflammatory changes. Spleen: Normal in size without significant abnormality. Adrenals/Urinary Tract: Adrenal glands are unremarkable. Status post right nephrectomy. No suspicious soft tissue or contrast enhancement in the nephrectomy bed. The left kidney is normal, without renal calculi, solid lesion, or hydronephrosis. Stomach/Bowel: Stomach is within normal limits. No evidence of bowel wall thickening, distention, or inflammatory changes. Vascular/Lymphatic: No significant vascular findings are present. No enlarged abdominal lymph nodes. Other: No abdominal wall hernia or abnormality. No ascites. Musculoskeletal: Rim enhancing lesion of the inferior endplate L1 measuring 1.2 x 1.1 cm (series 11, image 50). IMPRESSION: 1. Multiple new rim hypoenhancing lesions throughout the liver, consistent with metastatic disease. 2. Rim enhancing lesion of the inferior endplate L1 measuring 1.2 x 1.1 cm, concerning for metastatic disease. 3. Status post right nephrectomy. No evidence of local recurrence or lymphadenopathy Electronically Signed   By: ADelanna AhmadiM.D.   On: 06/09/2022 23:05     ASSESSMENT/PLAN:  This is a very pleasant 82year old Caucasian male diagnosed with metastatic papillary renal cell carcinoma.  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  This was diagnosed in June 2023.  He is status post a right open radical nephrectomy.  Patient was found to have metastatic disease to the liver and bone in February 2024.  The patient had previously underwent adjuvant treatment with Keytruda 20 mg IV every 3 weeks for 9 cycles.  He last saw Dr. MJulien Nordmannon 06/11/2022 and reviewed his MRI with him which showed multiple new rim-enhancing lesions throughout the liver consistent metastatic disease.  There was also a rim-enhancing lesion in the inferior endplate of L1 measuring 2.1 x 1.1  cm concerning for metastatic disease.  There is no local recurrence or lymphadenopathy.  Dr. MJulien Nordmannrecommended that the patient start treatment with nivolumab 480 mg IV every 4 weeks in addition to Cabometyx 40 grams p.o. daily.  He started this on 06/18/2022.  He tolerated this well.  The patient was seen with Dr. MJulien Nordmanntoday.  Labs were reviewed.  Recommend that he ***scheduled for nivolumab today?  This for cycle of treatment as scheduled.  Will see him back for follow-up visit in 4 weeks for evaluation and repeat blood work.    The patient was advised to call immediately if she has any concerning symptoms in the interval. The patient voices understanding of current disease status and treatment options and is in agreement with the current care plan. All questions were answered. The patient knows to call the  clinic with any problems, questions or concerns. We can certainly see the patient much sooner if necessary

## 2022-06-22 ENCOUNTER — Inpatient Hospital Stay: Payer: Medicare Other

## 2022-06-22 ENCOUNTER — Inpatient Hospital Stay (HOSPITAL_BASED_OUTPATIENT_CLINIC_OR_DEPARTMENT_OTHER): Payer: Medicare Other | Admitting: Physician Assistant

## 2022-06-22 VITALS — BP 151/74 | HR 71 | Temp 97.7°F | Resp 16 | Wt 161.7 lb

## 2022-06-22 DIAGNOSIS — C641 Malignant neoplasm of right kidney, except renal pelvis: Secondary | ICD-10-CM | POA: Diagnosis not present

## 2022-06-22 DIAGNOSIS — C642 Malignant neoplasm of left kidney, except renal pelvis: Secondary | ICD-10-CM | POA: Diagnosis not present

## 2022-06-22 LAB — CBC WITH DIFFERENTIAL (CANCER CENTER ONLY)
Abs Immature Granulocytes: 0.01 10*3/uL (ref 0.00–0.07)
Basophils Absolute: 0 10*3/uL (ref 0.0–0.1)
Basophils Relative: 1 %
Eosinophils Absolute: 0 10*3/uL (ref 0.0–0.5)
Eosinophils Relative: 1 %
HCT: 38.6 % — ABNORMAL LOW (ref 39.0–52.0)
Hemoglobin: 13 g/dL (ref 13.0–17.0)
Immature Granulocytes: 0 %
Lymphocytes Relative: 18 %
Lymphs Abs: 0.9 10*3/uL (ref 0.7–4.0)
MCH: 32.7 pg (ref 26.0–34.0)
MCHC: 33.7 g/dL (ref 30.0–36.0)
MCV: 97.2 fL (ref 80.0–100.0)
Monocytes Absolute: 0.4 10*3/uL (ref 0.1–1.0)
Monocytes Relative: 9 %
Neutro Abs: 3.4 10*3/uL (ref 1.7–7.7)
Neutrophils Relative %: 71 %
Platelet Count: 168 10*3/uL (ref 150–400)
RBC: 3.97 MIL/uL — ABNORMAL LOW (ref 4.22–5.81)
RDW: 12.8 % (ref 11.5–15.5)
WBC Count: 4.7 10*3/uL (ref 4.0–10.5)
nRBC: 0 % (ref 0.0–0.2)

## 2022-06-22 LAB — CMP (CANCER CENTER ONLY)
ALT: 32 U/L (ref 0–44)
AST: 26 U/L (ref 15–41)
Albumin: 4.4 g/dL (ref 3.5–5.0)
Alkaline Phosphatase: 101 U/L (ref 38–126)
Anion gap: 6 (ref 5–15)
BUN: 24 mg/dL — ABNORMAL HIGH (ref 8–23)
CO2: 29 mmol/L (ref 22–32)
Calcium: 9.2 mg/dL (ref 8.9–10.3)
Chloride: 104 mmol/L (ref 98–111)
Creatinine: 1.42 mg/dL — ABNORMAL HIGH (ref 0.61–1.24)
GFR, Estimated: 50 mL/min — ABNORMAL LOW (ref >60)
Glucose, Bld: 104 mg/dL — ABNORMAL HIGH (ref 70–99)
Potassium: 4.7 mmol/L (ref 3.5–5.1)
Sodium: 139 mmol/L (ref 135–145)
Total Bilirubin: 0.4 mg/dL (ref 0.3–1.2)
Total Protein: 7 g/dL (ref 6.5–8.1)

## 2022-06-22 LAB — TSH: TSH: 1.744 u[IU]/mL (ref 0.350–4.500)

## 2022-07-01 ENCOUNTER — Other Ambulatory Visit (HOSPITAL_COMMUNITY): Payer: Self-pay

## 2022-07-02 ENCOUNTER — Other Ambulatory Visit (HOSPITAL_COMMUNITY): Payer: Self-pay

## 2022-07-07 ENCOUNTER — Other Ambulatory Visit (HOSPITAL_COMMUNITY): Payer: Self-pay

## 2022-07-07 ENCOUNTER — Other Ambulatory Visit: Payer: Self-pay

## 2022-07-16 ENCOUNTER — Encounter: Payer: Self-pay | Admitting: Medical Oncology

## 2022-07-16 ENCOUNTER — Inpatient Hospital Stay: Payer: Medicare Other

## 2022-07-16 ENCOUNTER — Inpatient Hospital Stay (HOSPITAL_BASED_OUTPATIENT_CLINIC_OR_DEPARTMENT_OTHER): Payer: Medicare Other | Admitting: Internal Medicine

## 2022-07-16 ENCOUNTER — Inpatient Hospital Stay: Payer: Medicare Other | Attending: Oncology

## 2022-07-16 ENCOUNTER — Encounter: Payer: Self-pay | Admitting: Internal Medicine

## 2022-07-16 DIAGNOSIS — C7951 Secondary malignant neoplasm of bone: Secondary | ICD-10-CM | POA: Diagnosis not present

## 2022-07-16 DIAGNOSIS — Z5112 Encounter for antineoplastic immunotherapy: Secondary | ICD-10-CM | POA: Insufficient documentation

## 2022-07-16 DIAGNOSIS — Z79899 Other long term (current) drug therapy: Secondary | ICD-10-CM | POA: Insufficient documentation

## 2022-07-16 DIAGNOSIS — C641 Malignant neoplasm of right kidney, except renal pelvis: Secondary | ICD-10-CM | POA: Insufficient documentation

## 2022-07-16 DIAGNOSIS — C649 Malignant neoplasm of unspecified kidney, except renal pelvis: Secondary | ICD-10-CM | POA: Diagnosis not present

## 2022-07-16 DIAGNOSIS — C787 Secondary malignant neoplasm of liver and intrahepatic bile duct: Secondary | ICD-10-CM | POA: Diagnosis not present

## 2022-07-16 DIAGNOSIS — Z905 Acquired absence of kidney: Secondary | ICD-10-CM | POA: Diagnosis not present

## 2022-07-16 LAB — CBC WITH DIFFERENTIAL (CANCER CENTER ONLY)
Abs Immature Granulocytes: 0.02 10*3/uL (ref 0.00–0.07)
Basophils Absolute: 0 10*3/uL (ref 0.0–0.1)
Basophils Relative: 1 %
Eosinophils Absolute: 0.2 10*3/uL (ref 0.0–0.5)
Eosinophils Relative: 4 %
HCT: 38.1 % — ABNORMAL LOW (ref 39.0–52.0)
Hemoglobin: 13.5 g/dL (ref 13.0–17.0)
Immature Granulocytes: 0 %
Lymphocytes Relative: 16 %
Lymphs Abs: 0.8 10*3/uL (ref 0.7–4.0)
MCH: 33.1 pg (ref 26.0–34.0)
MCHC: 35.4 g/dL (ref 30.0–36.0)
MCV: 93.4 fL (ref 80.0–100.0)
Monocytes Absolute: 0.3 10*3/uL (ref 0.1–1.0)
Monocytes Relative: 7 %
Neutro Abs: 3.6 10*3/uL (ref 1.7–7.7)
Neutrophils Relative %: 72 %
Platelet Count: 92 10*3/uL — ABNORMAL LOW (ref 150–400)
RBC: 4.08 MIL/uL — ABNORMAL LOW (ref 4.22–5.81)
RDW: 13.4 % (ref 11.5–15.5)
WBC Count: 4.9 10*3/uL (ref 4.0–10.5)
nRBC: 0 % (ref 0.0–0.2)

## 2022-07-16 LAB — TSH: TSH: 5.864 u[IU]/mL — ABNORMAL HIGH (ref 0.350–4.500)

## 2022-07-16 LAB — CMP (CANCER CENTER ONLY)
ALT: 35 U/L (ref 0–44)
AST: 24 U/L (ref 15–41)
Albumin: 3.9 g/dL (ref 3.5–5.0)
Alkaline Phosphatase: 119 U/L (ref 38–126)
Anion gap: 8 (ref 5–15)
BUN: 21 mg/dL (ref 8–23)
CO2: 26 mmol/L (ref 22–32)
Calcium: 8.9 mg/dL (ref 8.9–10.3)
Chloride: 104 mmol/L (ref 98–111)
Creatinine: 1.41 mg/dL — ABNORMAL HIGH (ref 0.61–1.24)
GFR, Estimated: 51 mL/min — ABNORMAL LOW (ref >60)
Glucose, Bld: 174 mg/dL — ABNORMAL HIGH (ref 70–99)
Potassium: 4.4 mmol/L (ref 3.5–5.1)
Sodium: 138 mmol/L (ref 135–145)
Total Bilirubin: 0.6 mg/dL (ref 0.3–1.2)
Total Protein: 6.7 g/dL (ref 6.5–8.1)

## 2022-07-16 MED ORDER — SODIUM CHLORIDE 0.9 % IV SOLN
480.0000 mg | Freq: Once | INTRAVENOUS | Status: AC
  Administered 2022-07-16: 480 mg via INTRAVENOUS
  Filled 2022-07-16: qty 48

## 2022-07-16 MED ORDER — HEPARIN SOD (PORK) LOCK FLUSH 100 UNIT/ML IV SOLN: 500.0000 [IU] | Freq: Once | INTRAVENOUS | Status: DC | PRN

## 2022-07-16 MED ORDER — SODIUM CHLORIDE 0.9% FLUSH: 10.0000 mL | INTRAVENOUS | Status: DC | PRN

## 2022-07-16 MED ORDER — SODIUM CHLORIDE 0.9 % IV SOLN: Freq: Once | INTRAVENOUS | Status: AC

## 2022-07-16 NOTE — Progress Notes (Signed)
Patient seen by MD today  Vitals are within treatment parameters.  Labs reviewed: and are not all within treatment parameters. Per Dr. Julien Nordmann it is ok to treat pt today with Nivolumab and platelets of 92K.  Per physician team, patient is ready for treatment and there are NO modifications to the treatment plan.

## 2022-07-16 NOTE — Patient Instructions (Signed)
[facility]  Discharge Instructions: Thank you for choosing [facility] to provide your oncology and hematology care.   If you have a lab appointment with the Cancer Center, please go directly to the Cancer Center and check in at the registration area.   Wear comfortable clothing and clothing appropriate for easy access to any Portacath or PICC line.   We strive to give you quality time with your provider. You may need to reschedule your appointment if you arrive late (15 or more minutes).  Arriving late affects you and other patients whose appointments are after yours.  Also, if you miss three or more appointments without notifying the office, you may be dismissed from the clinic at the provider's discretion.      For prescription refill requests, have your pharmacy contact our office and allow 72 hours for refills to be completed.    Today you received the following chemotherapy and/or immunotherapy agents: Opdivo      To help prevent nausea and vomiting after your treatment, we encourage you to take your nausea medication as directed.  BELOW ARE SYMPTOMS THAT SHOULD BE REPORTED IMMEDIATELY: *FEVER GREATER THAN 100.4 F (38 C) OR HIGHER *CHILLS OR SWEATING *NAUSEA AND VOMITING THAT IS NOT CONTROLLED WITH YOUR NAUSEA MEDICATION *UNUSUAL SHORTNESS OF BREATH *UNUSUAL BRUISING OR BLEEDING *URINARY PROBLEMS (pain or burning when urinating, or frequent urination) *BOWEL PROBLEMS (unusual diarrhea, constipation, pain near the anus) TENDERNESS IN MOUTH AND THROAT WITH OR WITHOUT PRESENCE OF ULCERS (sore throat, sores in mouth, or a toothache) UNUSUAL RASH, SWELLING OR PAIN  UNUSUAL VAGINAL DISCHARGE OR ITCHING   Items with * indicate a potential emergency and should be followed up as soon as possible or go to the Emergency Department if any problems should occur.  Please show the CHEMOTHERAPY ALERT CARD or IMMUNOTHERAPY ALERT CARD at check-in  to the Emergency Department and triage nurse.  Should you have questions after your visit or need to cancel or reschedule your appointment, please contact [facility]  Dept: [phone]  and follow the prompts.  Office hours are 8:00 a.m. to 4:30 p.m. Monday - Friday. Please note that voicemails left after 4:00 p.m. may not be returned until the following business day.  We are closed weekends and major holidays. You have access to a nurse at all times for urgent questions. Please call the main number to the clinic Dept: [phone] and follow the prompts.   For any non-urgent questions, you may also contact your provider using MyChart. We now offer e-Visits for anyone 18 and older to request care online for non-urgent symptoms. For details visit [email or web address].   Also download the MyChart app! Go to the app store, search "MyChart", open the app, select [facility], and log in with your MyChart username and password.   

## 2022-07-16 NOTE — Progress Notes (Signed)
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  PRIOR THERAPY:  1) Status post right open radical nephrectomy completed on October 15, 2021 by Dr. Rexene Alberts.  2) Keytruda 200 Mg IV every 3 weeks started December 03, 2021.  Status post 9 cycles.  Last dose was given May 21, 2022.  This was discontinued secondary to disease progression  CURRENT THERAPY: Systemic treatment with nivolumab 480 Mg IV every 4 weeks in addition to Cabometyx 40 mg p.o. daily.  First dose June 18, 2022.  Status post 1 cycle.  INTERVAL HISTORY: Micheal Mcintyre 82 y.o. male returns to the clinic today for follow-up visit accompanied by his wife.  The patient is feeling fine today with no concerning complaints except for callus formation on the foot after restarting treatment with Cabometyx.  He denied having any significant nausea, vomiting, diarrhea but has more constipation.  He denied having any chest pain, shortness of breath, cough or hemoptysis.  He has no headache or visual changes.  He has no bleeding, bruises or ecchymosis.  He tolerated the first cycle of his treatment fairly well.  He is here for evaluation before starting cycle #2.  MEDICAL HISTORY: Past Medical History:  Diagnosis Date   Arthritis    GERD (gastroesophageal reflux disease)    Hypertension    Medicare annual wellness visit, subsequent 08/19/2014   Osteoarthritis 09/21/2007   Qualifier: Diagnosis of  By: Niel Hummer MD, Izell [place] R    PONV (postoperative nausea and vomiting)    Preventative health care 06/07/2016   Right shoulder pain 12/03/2016    ALLERGIES:  is allergic to statins.  MEDICATIONS:  Current  Outpatient Medications  Medication Sig Dispense Refill   acetaminophen (TYLENOL) 500 MG tablet Take 1,000 mg by mouth every 6 (six) hours as needed for moderate pain.     aluminum hydroxide-magnesium carbonate (GAVISCON) 95-358 MG/15ML SUSP Take 15 mLs by mouth as needed for heartburn or indigestion.     amLODipine (NORVASC) 5 MG tablet Take 5 mg by mouth daily.     cabozantinib (CABOMETYX) 40 MG tablet Take 1 tablet (40 mg total) by mouth daily. Take on an empty stomach, 1 hour before or 2 hours after meals. 30 tablet 3   Cholecalciferol (VITAMIN D3) 10 MCG (400 UNIT) tablet Take 800 Units by mouth daily.     diclofenac Sodium (VOLTAREN) 1 % GEL Apply 1 application. topically 4 (four) times daily as needed (pain).     ferrous sulfate 325 (65 FE) MG tablet Take 325 mg by mouth 4 (four) times a week.     ibuprofen (ADVIL) 200 MG tablet Take 200 mg by mouth daily.     Multiple Vitamin (MULTIVITAMIN WITH MINERALS) TABS tablet Take 1 tablet by mouth 4 (four) times a week.     Omega-3 350 MG CPDR Take 350 mg by mouth daily.     rosuvastatin (CRESTOR) 20 MG tablet Take 0.5 tablets (10 mg total) by mouth daily. 90 tablet 3   RSV vaccine recomb adjuvanted (AREXVY) 120 MCG/0.5ML injection Inject into the muscle. 0.5 mL 0   telmisartan (MICARDIS) 80 MG tablet Take 0.5 tablets (40 mg total) by  mouth 2 (two) times daily. 180 tablet 1   TURMERIC PO Take 1 tablet by mouth 3 (three) times a week.     No current facility-administered medications for this visit.    SURGICAL HISTORY:  Past Surgical History:  Procedure Laterality Date   CHOLECYSTECTOMY     HIP SURGERY     JOINT REPLACEMENT     NEPHRECTOMY Right 10/15/2021   Procedure: NEPHRECTOMY, OPEN;  Surgeon: Janith Lima, MD;  Location: [facility];  Service: Urology;  Laterality: Right;   nose skin cancer removal     TOTAL HIP ARTHROPLASTY      REVIEW OF SYSTEMS:  Constitutional: positive for fatigue Eyes: negative Ears, nose, mouth, throat, and  face: negative Respiratory: negative Cardiovascular: negative Gastrointestinal: negative Genitourinary:negative Integument/breast: positive for dryness, pruritus, and rash Hematologic/lymphatic: negative Musculoskeletal:positive for arthralgias Neurological: negative Behavioral/Psych: negative Endocrine: negative Allergic/Immunologic: negative   PHYSICAL EXAMINATION: General appearance: alert, cooperative, fatigued, and no distress Head: Normocephalic, without obvious abnormality, atraumatic Neck: no adenopathy, no JVD, supple, symmetrical, trachea midline, and thyroid not enlarged, symmetric, no tenderness/mass/nodules Lymph nodes: Cervical, supraclavicular, and axillary nodes normal. Resp: clear to auscultation bilaterally Back: symmetric, no curvature. ROM normal. No CVA tenderness. Cardio: regular rate and rhythm, S1, S2 normal, no murmur, click, rub or gallop GI: soft, non-tender; bowel sounds normal; no masses,  no organomegaly Extremities: extremities normal, atraumatic, no cyanosis or edema Neurologic: Alert and oriented X 3, normal strength and tone. Normal symmetric reflexes. Normal coordination and gait  ECOG PERFORMANCE STATUS: 1 - Symptomatic but completely ambulatory  Blood pressure (!) 141/83, pulse 82, temperature 97.6 F (36.4 C), temperature source Oral, resp. rate 18, weight 163 lb 3 oz (74 kg), SpO2 98 %.  LABORATORY DATA: Lab Results  Component Value Date   WBC 4.9 07/16/2022   HGB 13.5 07/16/2022   HCT 38.1 (L) 07/16/2022   MCV 93.4 07/16/2022   PLT 92 (L) 07/16/2022      Chemistry      Component Value Date/Time   NA 138 07/16/2022 0917   NA 140 04/22/2022 0000   K 4.4 07/16/2022 0917   CL 104 07/16/2022 0917   CO2 26 07/16/2022 0917   BUN 21 07/16/2022 0917   BUN 21 04/22/2022 0000   CREATININE 1.41 (H) 07/16/2022 0917   CREATININE 1.09 02/06/2020 1306   GLU 88 04/22/2022 0000      Component Value Date/Time   CALCIUM 8.9 07/16/2022 0917    ALKPHOS 119 07/16/2022 0917   AST 24 07/16/2022 0917   ALT 35 07/16/2022 0917   BILITOT 0.6 07/16/2022 0917       RADIOGRAPHIC STUDIES: No results found.  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  The patient has evidence for metastatic disease to the liver and bone in February 2024. The patient underwent treatment with adjuvant immunotherapy with Keytruda 200 Mg IV every 3 weeks status post 9 cycles.   He had MRI of the abdomen and pelvis performed recently.  I personally and independently reviewed the imaging studies and discussed the result with the patient today. Unfortunately his MRI showed multiple new rim-hypoenhancing lesions throughout the liver consistent with metastatic disease.  There was also a rim enhancing lesion of the inferior endplate of L1 measuring 1.2 x 1.1  cm concerning for metastatic disease.  There is no evidence of local recurrence or lymphadenopathy. I recommended for the patient to discontinue his current adjuvant treatment with [facility] because of lack of efficacy and the disease progression. We started the patient on palliative treatment with a combination of nivolumab 480 Mg IV every 4 weeks in addition to Cabometyx 40 mg p.o. daily.  First dose was given on June 18, 2022.  Status post 1 cycle. The patient tolerated the first cycle of his treatment fairly well except for the callus formation on the palms of the foot.  He will reach out to his podiatrist for evaluation and recommendation regarding treatment of this condition.  I also advised the patient to apply hydrocortisone cream to the affected area. He will proceed with cycle #2 today as planned. I will see him back for follow-up visit in 4 weeks for evaluation  before starting cycle #3. The patient was advised to call immediately if he has any other concerning symptoms in the interval. The patient voices understanding of current disease status and treatment options and is in agreement with the current care plan.  All questions were answered. The patient knows to call the clinic with any problems, questions or concerns. We can certainly see the patient much sooner if necessary.  The total time spent in the appointment was 30 minutes.  Disclaimer: This note was dictated with voice recognition software. Similar sounding words can inadvertently be transcribed and may not be corrected upon review.

## 2022-07-21 ENCOUNTER — Encounter: Payer: Self-pay | Admitting: Internal Medicine

## 2022-07-23 ENCOUNTER — Ambulatory Visit (INDEPENDENT_AMBULATORY_CARE_PROVIDER_SITE_OTHER): Payer: Medicare Other | Admitting: Podiatry

## 2022-07-23 ENCOUNTER — Ambulatory Visit (INDEPENDENT_AMBULATORY_CARE_PROVIDER_SITE_OTHER): Payer: Medicare Other

## 2022-07-23 DIAGNOSIS — L84 Corns and callosities: Secondary | ICD-10-CM

## 2022-07-23 DIAGNOSIS — M7742 Metatarsalgia, left foot: Secondary | ICD-10-CM

## 2022-07-23 DIAGNOSIS — M7741 Metatarsalgia, right foot: Secondary | ICD-10-CM

## 2022-07-23 DIAGNOSIS — M79672 Pain in left foot: Secondary | ICD-10-CM

## 2022-07-23 NOTE — Progress Notes (Signed)
  Subjective:  Patient ID: Micheal Mcintyre, male    DOB: October 18, 1940,   MRN: [id number]  Chief Complaint  Patient presents with   Foot Pain    Bilateral foot pain    82 y.o. male presents for concern for bilateral ball of foot pain. Relates this has been going on for a while since starting chemo. Relates he is not sure if it callus build up that is causing the problem. He has kidney cancer and has been on chemotherapy. Denies any other pedal complaints. Denies n/v/f/c.   Past Medical History:  Diagnosis Date   Arthritis    GERD (gastroesophageal reflux disease)    Hypertension    Medicare annual wellness visit, subsequent 08/19/2014   Osteoarthritis 09/21/2007   Qualifier: Diagnosis of  By: Niel Hummer MD, Izell Harlan R    PONV (postoperative nausea and vomiting)    Preventative health care 06/07/2016   Right shoulder pain 12/03/2016    Objective:  Physical Exam: Vascular: DP/PT pulses 2/4 bilateral. CFT <3 seconds. Normal hair growth on digits. No edema.  Skin. No lacerations or abrasions bilateral feet. Hyperkeratotic lesiosn noted bilateral sub second metatarsal.  Musculoskeletal: MMT 5/5 bilateral lower extremities in DF, PF, Inversion and Eversion. Deceased ROM in DF of ankle joint. Tender to ball of foot bilateral with pes cavus noted bilateral and plantar flexed metatarsals.  Neurological: Sensation intact to light touch.   Assessment:   1. Metatarsalgia of both feet   2. Callus   3. Pre-ulcerative calluses      Plan:  Patient was evaluated and treated and all questions answered. Discussed metatarsalgia and neuritis and treatment options with patient.  Radiographs reviewed and discussed with patient. Appears to have bilateral shortened and plantar flexed metatarsals although does appear patient likely was not fully weightbearing.  Discussed padding and offloading today. Padding dispensed today.  Hyperkeratotic tissue debrided without incident today as courtesy.  Advised to  keep bandaid on area as they are pre-ulcerative  Patient to return in 6 weeks or sooner if concerns arise.     Lorenda Peck, DPM

## 2022-07-29 ENCOUNTER — Other Ambulatory Visit (HOSPITAL_COMMUNITY): Payer: Self-pay

## 2022-07-29 ENCOUNTER — Other Ambulatory Visit: Payer: Medicare Other

## 2022-07-31 ENCOUNTER — Other Ambulatory Visit (HOSPITAL_COMMUNITY): Payer: Self-pay

## 2022-08-03 ENCOUNTER — Other Ambulatory Visit: Payer: Self-pay

## 2022-08-03 ENCOUNTER — Other Ambulatory Visit (HOSPITAL_COMMUNITY): Payer: Self-pay

## 2022-08-05 ENCOUNTER — Other Ambulatory Visit (INDEPENDENT_AMBULATORY_CARE_PROVIDER_SITE_OTHER): Payer: Medicare Other

## 2022-08-05 ENCOUNTER — Other Ambulatory Visit: Payer: Self-pay | Admitting: *Deleted

## 2022-08-05 DIAGNOSIS — I10 Essential (primary) hypertension: Secondary | ICD-10-CM | POA: Diagnosis not present

## 2022-08-05 DIAGNOSIS — E782 Mixed hyperlipidemia: Secondary | ICD-10-CM

## 2022-08-05 LAB — COMPREHENSIVE METABOLIC PANEL
ALT: 29 U/L (ref 0–53)
AST: 22 U/L (ref 0–37)
Albumin: 3.8 g/dL (ref 3.5–5.2)
Alkaline Phosphatase: 110 U/L (ref 39–117)
BUN: 22 mg/dL (ref 6–23)
CO2: 27 mEq/L (ref 19–32)
Calcium: 8.8 mg/dL (ref 8.4–10.5)
Chloride: 102 mEq/L (ref 96–112)
Creatinine, Ser: 1.33 mg/dL (ref 0.40–1.50)
GFR: 50.89 mL/min — ABNORMAL LOW (ref >60.00)
Glucose, Bld: 149 mg/dL — ABNORMAL HIGH (ref 70–99)
Potassium: 4.7 mEq/L (ref 3.5–5.1)
Sodium: 137 mEq/L (ref 135–145)
Total Bilirubin: 0.7 mg/dL (ref 0.2–1.2)
Total Protein: 6.3 g/dL (ref 6.0–8.3)

## 2022-08-05 LAB — LIPID PANEL
Cholesterol: 151 mg/dL (ref 0–200)
HDL: 49.5 mg/dL (ref >39.00)
LDL Cholesterol: 77 mg/dL (ref 0–99)
NonHDL: 101.56
Total CHOL/HDL Ratio: 3
Triglycerides: 123 mg/dL (ref 0.0–149.0)
VLDL: 24.6 mg/dL (ref 0.0–40.0)

## 2022-08-06 ENCOUNTER — Ambulatory Visit (INDEPENDENT_AMBULATORY_CARE_PROVIDER_SITE_OTHER): Payer: Medicare Other | Admitting: Podiatry

## 2022-08-06 ENCOUNTER — Encounter: Payer: Self-pay | Admitting: Podiatry

## 2022-08-06 DIAGNOSIS — L988 Other specified disorders of the skin and subcutaneous tissue: Secondary | ICD-10-CM

## 2022-08-06 DIAGNOSIS — L84 Corns and callosities: Secondary | ICD-10-CM | POA: Diagnosis not present

## 2022-08-06 DIAGNOSIS — B353 Tinea pedis: Secondary | ICD-10-CM

## 2022-08-06 NOTE — Progress Notes (Signed)
  Subjective:  Patient ID: Micheal Mcintyre, male    DOB: 09/11/1940,   MRN: [id number]  Chief Complaint  Patient presents with   Ulcer    Left foot ulcer.     82 y.o. male presents for new concern of blister on his left foot that has been sore for the past couple days. Relates they have been putting neosporin and hydrocortisone on the area and has been feeling better. Relates he has had sepsis before and didn't want to wait on it if infected.  He has kidney cancer and has been on chemotherapy. Denies any other pedal complaints. Denies n/v/f/c.   Past Medical History:  Diagnosis Date   Arthritis    GERD (gastroesophageal reflux disease)    Hypertension    Medicare annual wellness visit, subsequent 08/19/2014   Osteoarthritis 09/21/2007   Qualifier: Diagnosis of  By: Niel Hummer MD, Izell [place]ennville R    PONV (postoperative nausea and vomiting)    Preventative health care 06/07/2016   Right shoulder pain 12/03/2016    Objective:  Physical Exam: Vascular: DP/PT pulses 2/4 bilateral. CFT <3 seconds. Normal hair growth on digits. No edema.  Skin. No lacerations or abrasions bilateral feet. Hyperkeratotic lesiosn noted bilateral sub second metatarsal improved from prior. There is maceration and erythema noted in the fourth interspace on the right foot there is also some erythema and scaling noted in the second interspace. No open lesion but skin very friable.  Musculoskeletal: MMT 5/5 bilateral lower extremities in DF, PF, Inversion and Eversion. Deceased ROM in DF of ankle joint. Tender to ball of foot bilateral with pes cavus noted bilateral and plantar flexed metatarsals.  Neurological: Sensation intact to light touch.   Assessment:   1. Pre-ulcerative calluses   2. Maceration of skin   3. Tinea pedis of right foot       Plan:  Patient was evaluated and treated and all questions answered. Pre-ulcerative area in right fourth itnerspace with maceration  -No debridement necessary  -Dressed  with betadine, DSD. -Discussed applying betadine daily to area.  -No abx indicated.  -Discussed if any worsening redness, pain, fever or chills to call or may need to report to the emergency room. Patient expressed understanding.  Return in 2 weeks for recheck.     Return in about 2 weeks (around 08/20/2022).     Lorenda Peck, DPM

## 2022-08-11 ENCOUNTER — Other Ambulatory Visit: Payer: Self-pay | Admitting: Medical Oncology

## 2022-08-11 ENCOUNTER — Other Ambulatory Visit (HOSPITAL_COMMUNITY): Payer: Self-pay

## 2022-08-11 ENCOUNTER — Telehealth: Payer: Self-pay | Admitting: Medical Oncology

## 2022-08-11 DIAGNOSIS — R112 Nausea with vomiting, unspecified: Secondary | ICD-10-CM

## 2022-08-11 NOTE — Telephone Encounter (Addendum)
Recent nausea , not eating or drinking, He is drinking 16 oz/fluid day -Gatorade and water.He vomited this am and went back to sleep . He is very weak. 03/14-He received Cycle 2 nivolumab. Wife instructed for pt to hold cabometyx. She voiced understanding.

## 2022-08-12 ENCOUNTER — Telehealth: Payer: Self-pay | Admitting: Medical Oncology

## 2022-08-12 ENCOUNTER — Inpatient Hospital Stay: Payer: Medicare Other

## 2022-08-12 ENCOUNTER — Inpatient Hospital Stay: Payer: Medicare Other | Admitting: Physician Assistant

## 2022-08-12 NOTE — Telephone Encounter (Signed)
Wife cancelled appts today . Key said he can wait until tomorrow for his lab and infusion appt.Marland Kitchen He feels better.

## 2022-08-13 ENCOUNTER — Encounter: Payer: Self-pay | Admitting: Internal Medicine

## 2022-08-13 ENCOUNTER — Telehealth: Payer: Self-pay | Admitting: Medical Oncology

## 2022-08-13 ENCOUNTER — Inpatient Hospital Stay: Payer: Medicare Other

## 2022-08-13 ENCOUNTER — Other Ambulatory Visit: Payer: Self-pay

## 2022-08-13 ENCOUNTER — Other Ambulatory Visit: Payer: Self-pay | Admitting: Internal Medicine

## 2022-08-13 ENCOUNTER — Inpatient Hospital Stay: Payer: Medicare Other | Attending: Oncology | Admitting: Internal Medicine

## 2022-08-13 ENCOUNTER — Encounter: Payer: Self-pay | Admitting: Medical Oncology

## 2022-08-13 VITALS — BP 103/58 | HR 95 | Temp 97.6°F | Resp 15 | Wt 150.3 lb

## 2022-08-13 DIAGNOSIS — E860 Dehydration: Secondary | ICD-10-CM | POA: Diagnosis not present

## 2022-08-13 DIAGNOSIS — C787 Secondary malignant neoplasm of liver and intrahepatic bile duct: Secondary | ICD-10-CM | POA: Diagnosis not present

## 2022-08-13 DIAGNOSIS — Z5112 Encounter for antineoplastic immunotherapy: Secondary | ICD-10-CM | POA: Insufficient documentation

## 2022-08-13 DIAGNOSIS — I10 Essential (primary) hypertension: Secondary | ICD-10-CM | POA: Insufficient documentation

## 2022-08-13 DIAGNOSIS — R112 Nausea with vomiting, unspecified: Secondary | ICD-10-CM | POA: Diagnosis not present

## 2022-08-13 DIAGNOSIS — Z79899 Other long term (current) drug therapy: Secondary | ICD-10-CM | POA: Insufficient documentation

## 2022-08-13 DIAGNOSIS — M1990 Unspecified osteoarthritis, unspecified site: Secondary | ICD-10-CM | POA: Diagnosis not present

## 2022-08-13 DIAGNOSIS — C649 Malignant neoplasm of unspecified kidney, except renal pelvis: Secondary | ICD-10-CM | POA: Insufficient documentation

## 2022-08-13 DIAGNOSIS — C7951 Secondary malignant neoplasm of bone: Secondary | ICD-10-CM | POA: Insufficient documentation

## 2022-08-13 DIAGNOSIS — K219 Gastro-esophageal reflux disease without esophagitis: Secondary | ICD-10-CM | POA: Insufficient documentation

## 2022-08-13 DIAGNOSIS — M2011 Hallux valgus (acquired), right foot: Secondary | ICD-10-CM | POA: Insufficient documentation

## 2022-08-13 DIAGNOSIS — R630 Anorexia: Secondary | ICD-10-CM | POA: Diagnosis not present

## 2022-08-13 DIAGNOSIS — Z791 Long term (current) use of non-steroidal anti-inflammatories (NSAID): Secondary | ICD-10-CM | POA: Diagnosis not present

## 2022-08-13 LAB — CBC WITH DIFFERENTIAL (CANCER CENTER ONLY)
Abs Immature Granulocytes: 0.01 10*3/uL (ref 0.00–0.07)
Basophils Absolute: 0 10*3/uL (ref 0.0–0.1)
Basophils Relative: 1 %
Eosinophils Absolute: 0 10*3/uL (ref 0.0–0.5)
Eosinophils Relative: 1 %
HCT: 34.5 % — ABNORMAL LOW (ref 39.0–52.0)
Hemoglobin: 12.1 g/dL — ABNORMAL LOW (ref 13.0–17.0)
Immature Granulocytes: 0 %
Lymphocytes Relative: 14 %
Lymphs Abs: 0.6 10*3/uL — ABNORMAL LOW (ref 0.7–4.0)
MCH: 32.8 pg (ref 26.0–34.0)
MCHC: 35.1 g/dL (ref 30.0–36.0)
MCV: 93.5 fL (ref 80.0–100.0)
Monocytes Absolute: 0.5 10*3/uL (ref 0.1–1.0)
Monocytes Relative: 11 %
Neutro Abs: 3.2 10*3/uL (ref 1.7–7.7)
Neutrophils Relative %: 73 %
Platelet Count: 122 10*3/uL — ABNORMAL LOW (ref 150–400)
RBC: 3.69 MIL/uL — ABNORMAL LOW (ref 4.22–5.81)
RDW: 13.5 % (ref 11.5–15.5)
WBC Count: 4.3 10*3/uL (ref 4.0–10.5)
nRBC: 0 % (ref 0.0–0.2)

## 2022-08-13 LAB — CMP (CANCER CENTER ONLY)
ALT: 34 U/L (ref 0–44)
AST: 23 U/L (ref 15–41)
Albumin: 3.5 g/dL (ref 3.5–5.0)
Alkaline Phosphatase: 104 U/L (ref 38–126)
Anion gap: 8 (ref 5–15)
BUN: 29 mg/dL — ABNORMAL HIGH (ref 8–23)
CO2: 24 mmol/L (ref 22–32)
Calcium: 9.1 mg/dL (ref 8.9–10.3)
Chloride: 101 mmol/L (ref 98–111)
Creatinine: 1.26 mg/dL — ABNORMAL HIGH (ref 0.61–1.24)
GFR, Estimated: 58 mL/min — ABNORMAL LOW (ref >60)
Glucose, Bld: 169 mg/dL — ABNORMAL HIGH (ref 70–99)
Potassium: 4.2 mmol/L (ref 3.5–5.1)
Sodium: 133 mmol/L — ABNORMAL LOW (ref 135–145)
Total Bilirubin: 0.8 mg/dL (ref 0.3–1.2)
Total Protein: 6.6 g/dL (ref 6.5–8.1)

## 2022-08-13 LAB — TSH: TSH: 0.012 u[IU]/mL — ABNORMAL LOW (ref 0.350–4.500)

## 2022-08-13 MED ORDER — SODIUM CHLORIDE 0.9 % IV SOLN
480.0000 mg | Freq: Once | INTRAVENOUS | Status: AC
  Administered 2022-08-13: 480 mg via INTRAVENOUS
  Filled 2022-08-13: qty 48

## 2022-08-13 MED ORDER — PROCHLORPERAZINE MALEATE 10 MG PO TABS: 10.0000 mg | ORAL_TABLET | Freq: Four times a day (QID) | ORAL | 0 refills | Status: DC | PRN

## 2022-08-13 MED ORDER — ONDANSETRON HCL 4 MG/2ML IJ SOLN
8.0000 mg | Freq: Once | INTRAMUSCULAR | Status: AC
  Administered 2022-08-13: 8 mg via INTRAVENOUS
  Filled 2022-08-13: qty 4

## 2022-08-13 MED ORDER — SODIUM CHLORIDE 0.9 % IV SOLN: Freq: Once | INTRAVENOUS | Status: DC

## 2022-08-13 MED ORDER — SODIUM CHLORIDE 0.9 % IV SOLN: Freq: Once | INTRAVENOUS | Status: AC

## 2022-08-13 NOTE — Telephone Encounter (Signed)
Pt on the way for his appts.

## 2022-08-13 NOTE — Progress Notes (Signed)
Per Dr. Arbutus Ped , it is ok to run IVF 0.9 % normal saline over 1 hour.

## 2022-08-13 NOTE — Progress Notes (Signed)
[facility] [phone]   [phone]  OFFICE PROGRESS NOTE  [name], MD 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  PRIOR THERAPY:  1) Status post right open radical nephrectomy completed on October 15, 2021 by Dr. Jettie Pagan.  2) Keytruda 200 Mg IV every 3 weeks started December 03, 2021.  Status post 9 cycles.  Last dose was given May 21, 2022.  This was discontinued secondary to disease progression  CURRENT THERAPY: Systemic treatment with nivolumab 480 Mg IV every 4 weeks in addition to Cabometyx 40 mg p.o. daily.  First dose June 18, 2022.  Status post 2 cycles.  INTERVAL HISTORY: Micheal Mcintyre 82 y.o. male returns to the clinic today for follow-up visit accompanied by his wife.  The patient has been complaining of increasing fatigue and weakness as well as lack of appetite, nausea and one-time vomiting.  I asked him to hold his treatment with Cabometyx and he has been off the treatment for the last 3-4 days.  He denied having any current chest pain, shortness of breath, cough or hemoptysis.  He has no current abdominal pain, diarrhea or constipation.  He has no headache or visual changes.  He is here today for evaluation before starting cycle #3 of his treatment.  MEDICAL HISTORY: Past Medical History:  Diagnosis Date   Arthritis    GERD (gastroesophageal reflux disease)    Hypertension    Medicare annual wellness visit, subsequent 08/19/2014   Osteoarthritis 09/21/2007   Qualifier: Diagnosis of  By: Alphonzo Severance MD, Ivar Drape R    PONV (postoperative nausea and vomiting)    Preventative health care 06/07/2016   Right shoulder pain 12/03/2016    ALLERGIES:  is allergic to statins.  MEDICATIONS:  Current Outpatient  Medications  Medication Sig Dispense Refill   acetaminophen (TYLENOL) 500 MG tablet Take 1,000 mg by mouth every 6 (six) hours as needed for moderate pain.     aluminum hydroxide-magnesium carbonate (GAVISCON) 95-358 MG/15ML SUSP Take 15 mLs by mouth as needed for heartburn or indigestion.     amLODipine (NORVASC) 5 MG tablet Take 5 mg by mouth daily.     cabozantinib (CABOMETYX) 40 MG tablet Take 1 tablet (40 mg total) by mouth daily. Take on an empty stomach, 1 hour before or 2 hours after meals. 30 tablet 3   Cholecalciferol (VITAMIN D3) 10 MCG (400 UNIT) tablet Take 800 Units by mouth daily.     diclofenac Sodium (VOLTAREN) 1 % GEL Apply 1 application. topically 4 (four) times daily as needed (pain).     ferrous sulfate 325 (65 FE) MG tablet Take 325 mg by mouth 4 (four) times a week.     ibuprofen (ADVIL) 200 MG tablet Take 200 mg by mouth daily.     Multiple Vitamin (MULTIVITAMIN WITH MINERALS) TABS tablet Take 1 tablet by mouth 4 (four) times a week.     Omega-3 350 MG CPDR Take 350 mg by mouth daily.     rosuvastatin (CRESTOR) 20 MG tablet Take 0.5 tablets (10 mg total) by mouth daily. 90 tablet 3   RSV vaccine recomb adjuvanted (AREXVY) 120 MCG/0.5ML injection Inject into the muscle. 0.5 mL 0   telmisartan (MICARDIS) 80 MG tablet Take 0.5 tablets (40  mg total) by mouth 2 (two) times daily. 180 tablet 1   TURMERIC PO Take 1 tablet by mouth 3 (three) times a week.     No current facility-administered medications for this visit.    SURGICAL HISTORY:  Past Surgical History:  Procedure Laterality Date   CHOLECYSTECTOMY     HIP SURGERY     JOINT REPLACEMENT     NEPHRECTOMY Right 10/15/2021   Procedure: NEPHRECTOMY, OPEN;  Surgeon: Jannifer HickGay, Matthew R, MD;  Location: [facility];  Service: Urology;  Laterality: Right;   nose skin cancer removal     TOTAL HIP ARTHROPLASTY      REVIEW OF SYSTEMS:  Constitutional: positive for anorexia, fatigue, and weight loss Eyes: negative Ears, nose, mouth,  throat, and face: negative Respiratory: negative Cardiovascular: negative Gastrointestinal: positive for nausea and vomiting Genitourinary:negative Integument/breast: positive for dryness Hematologic/lymphatic: negative Musculoskeletal:positive for arthralgias Neurological: negative Behavioral/Psych: negative Endocrine: negative Allergic/Immunologic: negative   PHYSICAL EXAMINATION: General appearance: alert, cooperative, fatigued, and no distress Head: Normocephalic, without obvious abnormality, atraumatic Neck: no adenopathy, no JVD, supple, symmetrical, trachea midline, and thyroid not enlarged, symmetric, no tenderness/mass/nodules Lymph nodes: Cervical, supraclavicular, and axillary nodes normal. Resp: clear to auscultation bilaterally Back: symmetric, no curvature. ROM normal. No CVA tenderness. Cardio: regular rate and rhythm, S1, S2 normal, no murmur, click, rub or gallop GI: soft, non-tender; bowel sounds normal; no masses,  no organomegaly Extremities: extremities normal, atraumatic, no cyanosis or edema Neurologic: Alert and oriented X 3, normal strength and tone. Normal symmetric reflexes. Normal coordination and gait  ECOG PERFORMANCE STATUS: 1 - Symptomatic but completely ambulatory  Blood pressure (!) 103/58, pulse 95, temperature 97.6 F (36.4 C), temperature source Temporal, resp. rate 15, weight 150 lb 4.8 oz (68.2 kg), SpO2 99 %.  LABORATORY DATA: Lab Results  Component Value Date   WBC 4.3 08/13/2022   HGB 12.1 (L) 08/13/2022   HCT 34.5 (L) 08/13/2022   MCV 93.5 08/13/2022   PLT 122 (L) 08/13/2022      Chemistry      Component Value Date/Time   NA 137 08/05/2022 0939   NA 140 04/22/2022 0000   K 4.7 08/05/2022 0939   CL 102 08/05/2022 0939   CO2 27 08/05/2022 0939   BUN 22 08/05/2022 0939   BUN 21 04/22/2022 0000   CREATININE 1.33 08/05/2022 0939   CREATININE 1.41 (H) 07/16/2022 0917   CREATININE 1.09 02/06/2020 1306   GLU 88 04/22/2022 0000       Component Value Date/Time   CALCIUM 8.8 08/05/2022 0939   ALKPHOS 110 08/05/2022 0939   AST 22 08/05/2022 0939   AST 24 07/16/2022 0917   ALT 29 08/05/2022 0939   ALT 35 07/16/2022 0917   BILITOT 0.7 08/05/2022 0939   BILITOT 0.6 07/16/2022 0917       RADIOGRAPHIC STUDIES: DG Foot Complete Right  Result Date: 07/24/2022 CLINICAL DATA:  Lateral follow foot pain. EXAM: RIGHT FOOT COMPLETE - 3+ VIEW COMPARISON:  None Available. FINDINGS: Small plantar and posterior calcaneal heel spurs. Minimal dorsal talonavicular and navicular-cuneiform degenerative osteophytes. Mild navicular-cuneiform joint space narrowing. No acute fracture or dislocation. IMPRESSION: 1. Mild midfoot osteoarthritis. 2. Small plantar and posterior calcaneal heel spurs. Electronically Signed   By: Neita Garnetonald  Viola M.D.   On: 07/24/2022 17:45   DG Foot Complete Left  Result Date: 07/24/2022 CLINICAL DATA:  Bilateral bowel foot pain. EXAM: LEFT FOOT - COMPLETE 3+ VIEW COMPARISON:  None Available. FINDINGS: Borderline mild hallux valgus. Normal bone  mineralization. Joint spaces are preserved. No acute fracture or dislocation. There is a linear likely metallic density foreign body measuring 4 x less than 1 mm overlying the soft tissues at the junction of the first and second toe bases, just medial to the base of the proximal phalanx of the second toe. IMPRESSION: 1. Borderline mild hallux valgus. 2. There is a 4 x less than 1 mm linear likely metallic density foreign body overlying the soft tissues at the junction of the first and second toe bases, just medial to the base of the proximal phalanx of the second toe. Electronically Signed   By: Neita Garnet M.D.   On: 07/24/2022 17:44    ASSESSMENT AND PLAN: This is a very pleasant 82 years old white male diagnosed with metastatic papillary renal cell carcinoma initially diagnosed as stage IIIa (T3a, N0, M0) papillary renal cell carcinoma with rhabdoid features diagnosed in June 2023  status post right open radical nephrectomy.  The patient has evidence for metastatic disease to the liver and bone in February 2024. The patient underwent treatment with adjuvant immunotherapy with Keytruda 200 Mg IV every 3 weeks status post 9 cycles.   He had MRI of the abdomen and pelvis performed recently.  I personally and independently reviewed the imaging studies and discussed the result with the patient today. Unfortunately his MRI showed multiple new rim-hypoenhancing lesions throughout the liver consistent with metastatic disease.  There was also a rim enhancing lesion of the inferior endplate of L1 measuring 1.2 x 1.1 cm concerning for metastatic disease.  There is no evidence of local recurrence or lymphadenopathy. I recommended for the patient to discontinue his current adjuvant treatment with [facility] because of lack of efficacy and the disease progression. We started the patient on palliative treatment with a combination of nivolumab 480 Mg IV every 4 weeks in addition to Cabometyx 40 mg p.o. daily.  First dose was given on June 18, 2022.  Status post 2 cycles.  He has more fatigue and weakness as well as nausea and vomiting after cycle #2. I recommended for the patient to continue to hold his treatment with Cabometyx for 4-5 more days.  Will proceed with the treatment with nivolumab today as planned.  If he has intolerance to the current dose of Cabometyx, I will reduce his dose to 20 mg p.o. daily. For the dehydration and lack of appetite, I will arrange for the patient to receive 1 L of normal saline with Zofran 8 mg IV today. I will arrange for the patient to have repeat CT scan of the chest, abdomen and pelvis for restaging of his disease before the next cycle of his treatment. The patient was advised to call immediately if he has any other concerning symptoms in the interval. The patient voices understanding of current disease status and treatment options and is in agreement with  the current care plan.  All questions were answered. The patient knows to call the clinic with any problems, questions or concerns. We can certainly see the patient much sooner if necessary.  The total time spent in the appointment was 30 minutes.  Disclaimer: This note was dictated with voice recognition software. Similar sounding words can inadvertently be transcribed and may not be corrected upon review.

## 2022-08-13 NOTE — Progress Notes (Signed)
Patient seen by Dr. Gypsy Balsam are within treatment parameters.  Labs reviewed: and are within treatment parameters.  Per physician team, patient is ready for treatment and there are NO modifications to the treatment plan. IVF and zofran ordered for today.

## 2022-08-14 ENCOUNTER — Other Ambulatory Visit: Payer: Self-pay

## 2022-08-14 LAB — T4: T4, Total: 23.7 ug/dL — ABNORMAL HIGH (ref 4.5–12.0)

## 2022-08-15 ENCOUNTER — Other Ambulatory Visit: Payer: Self-pay | Admitting: Internal Medicine

## 2022-08-15 MED ORDER — LEVOTHYROXINE SODIUM 50 MCG PO TABS: 50.0000 ug | ORAL_TABLET | Freq: Every day | ORAL | 2 refills | Status: DC

## 2022-08-17 ENCOUNTER — Encounter: Payer: Self-pay | Admitting: *Deleted

## 2022-08-19 ENCOUNTER — Telehealth: Payer: Self-pay | Admitting: Medical Oncology

## 2022-08-19 ENCOUNTER — Emergency Department (HOSPITAL_BASED_OUTPATIENT_CLINIC_OR_DEPARTMENT_OTHER)
Admission: EM | Admit: 2022-08-19 | Discharge: 2022-08-19 | Discharge status: Absent without leave | Payer: Medicare Other | Source: Home / Self Care

## 2022-08-19 ENCOUNTER — Inpatient Hospital Stay: Payer: Medicare Other

## 2022-08-19 ENCOUNTER — Other Ambulatory Visit: Payer: Self-pay

## 2022-08-19 ENCOUNTER — Other Ambulatory Visit: Payer: Self-pay | Admitting: Medical Oncology

## 2022-08-19 ENCOUNTER — Other Ambulatory Visit: Payer: Self-pay | Admitting: Neurological Surgery

## 2022-08-19 VITALS — BP 131/60 | HR 75 | Temp 97.5°F | Resp 17

## 2022-08-19 DIAGNOSIS — Z5112 Encounter for antineoplastic immunotherapy: Secondary | ICD-10-CM | POA: Diagnosis not present

## 2022-08-19 DIAGNOSIS — E860 Dehydration: Secondary | ICD-10-CM

## 2022-08-19 DIAGNOSIS — R1901 Right upper quadrant abdominal swelling, mass and lump: Secondary | ICD-10-CM

## 2022-08-19 MED ORDER — SODIUM CHLORIDE 0.9 % IV SOLN: Freq: Once | INTRAVENOUS | Status: AC

## 2022-08-19 NOTE — Patient Instructions (Signed)

## 2022-08-19 NOTE — Telephone Encounter (Signed)
Per Arbutus Ped , he said to hold Cabometyx until he feels better. IVF scheduled for [facility].

## 2022-08-19 NOTE — Telephone Encounter (Signed)
Can he restart his Cabometyx?  He feels a little better but still weak.   Wife requested appt for  IVF.   CT scheduled for May then follow up.

## 2022-08-20 ENCOUNTER — Encounter: Payer: Self-pay | Admitting: Podiatry

## 2022-08-20 ENCOUNTER — Ambulatory Visit (INDEPENDENT_AMBULATORY_CARE_PROVIDER_SITE_OTHER): Payer: Medicare Other | Admitting: Podiatry

## 2022-08-20 DIAGNOSIS — L988 Other specified disorders of the skin and subcutaneous tissue: Secondary | ICD-10-CM

## 2022-08-20 DIAGNOSIS — L84 Corns and callosities: Secondary | ICD-10-CM

## 2022-08-20 DIAGNOSIS — M7742 Metatarsalgia, left foot: Secondary | ICD-10-CM | POA: Diagnosis not present

## 2022-08-20 DIAGNOSIS — M7741 Metatarsalgia, right foot: Secondary | ICD-10-CM

## 2022-08-20 NOTE — Progress Notes (Signed)
  Subjective:  Patient ID: Micheal Mcintyre    DOB: [birth year],   MRN: [id number]  Chief Complaint  Patient presents with   Blister    Blister f/u    82 y.o. male presents for new concern of blister on his left foot that has been sore for the past couple days. Relates they have been putting neosporin and hydrocortisone on the area and has been feeling better. Relates he has had sepsis before and didn't want to wait on it if infected.  He has kidney cancer and has been on chemotherapy. Denies any other pedal complaints. Denies n/v/f/c.   Past Medical History:  Diagnosis Date   Arthritis    GERD (gastroesophageal reflux disease)    Hypertension    Medicare annual wellness visit, subsequent 08/19/2014   Osteoarthritis 09/21/2007   Qualifier: Diagnosis of  By: Alphonzo Severance MD, Ivar Drape R    PONV (postoperative nausea and vomiting)    Preventative health care 06/07/2016   Right shoulder pain 12/03/2016    Objective:  Physical Exam: Vascular: DP/PT pulses 2/4 bilateral. CFT <3 seconds. Normal hair growth on digits. No edema.  Skin. No lacerations or abrasions bilateral feet. Hyperkeratotic lesiosn noted bilateral sub second metatarsal improved from prior. There is maceration and erythema noted in the fourth interspace on the right foot but greatly improved.  Musculoskeletal: MMT 5/5 bilateral lower extremities in DF, PF, Inversion and Eversion. Deceased ROM in DF of ankle joint. Tender to ball of foot bilateral with pes cavus noted bilateral and plantar flexed metatarsals.  Neurological: Sensation intact to light touch.   Assessment:   1. Pre-ulcerative calluses   2. Maceration of skin   3. Metatarsalgia of both feet        Plan:  Patient was evaluated and treated and all questions answered. Pre-ulcerative area in right fourth itnerspace with minimal macertion.  Pre-ulverative areas to bilateral plantar second metatarsals.  -No debridement necessary  -Continue applying betadine  daily to area.  -No abx indicated.  -Discussed if any worsening redness, pain, fever or chills to call or may need to report to the emergency room. Patient expressed understanding.  Return in 8 weeks for foot check. Return earlier if any concern for ulceration.     Return in about 8 weeks (around 10/15/2022) for foot check .     Louann Sjogren, DPM

## 2022-08-21 ENCOUNTER — Other Ambulatory Visit: Payer: Self-pay

## 2022-08-21 ENCOUNTER — Telehealth: Payer: Self-pay | Admitting: Internal Medicine

## 2022-08-21 NOTE — Telephone Encounter (Signed)
Rescheduled 05/09 appointment to 05/08 due to provider pal, patient has been called and notified.  

## 2022-08-24 ENCOUNTER — Telehealth: Payer: Self-pay | Admitting: Medical Oncology

## 2022-08-24 ENCOUNTER — Other Ambulatory Visit: Payer: Self-pay | Admitting: Medical Oncology

## 2022-08-24 ENCOUNTER — Inpatient Hospital Stay: Payer: Medicare Other

## 2022-08-24 DIAGNOSIS — E860 Dehydration: Secondary | ICD-10-CM

## 2022-08-24 DIAGNOSIS — Z5112 Encounter for antineoplastic immunotherapy: Secondary | ICD-10-CM | POA: Diagnosis not present

## 2022-08-24 MED ORDER — SODIUM CHLORIDE 0.9 % IV SOLN: Freq: Once | INTRAVENOUS | Status: AC

## 2022-08-24 NOTE — Telephone Encounter (Signed)
Still "weak as water". Sleeps a lot. Requests IVF today and Friday.  PN -hard time with picking up pills , anything small.  Restarted cabometyx  last wed. He takes prochlorperazine prn.

## 2022-08-24 NOTE — Patient Instructions (Signed)
Dehydration, Adult Dehydration is a condition in which there is not enough water or other fluids in the body. This happens when a person loses more fluids than they take in. Important organs cannot work right without the right amount of fluids. Any loss of fluids from the body can cause dehydration. Dehydration can be mild, worse, or very bad. It should be treated right away to keep it from getting very bad. What are the causes? Conditions that cause loss of water in the body. They include: Watery poop (diarrhea). Vomiting. Sweating a lot. Fever. Infection. Peeing (urinating) a lot. Not drinking enough fluids. Certain medicines, such as medicines that take extra fluid out of the body (diuretics). Lack of safe drinking water. Not being able to get enough water and food. What increases the risk? Having a long-term (chronic) illness that has not been treated the right way, such as: Diabetes. Heart disease. Kidney disease. Being 65 years of age or older. Having a disability. Living in a place that is high above the ground or sea (high in altitude). The thinner, drier air causes more fluid loss. Doing exercises that put stress on your body for a long time. Being active when in hot places. What are the signs or symptoms? Symptoms of dehydration depend on how bad it is. Mild or worse dehydration Thirst. Dry lips or dry mouth. Feeling dizzy or light-headed. Muscle cramps. Passing little pee or dark pee. Pee may be the color of tea. Headache. Very bad dehydration Changes in skin. Skin may: Be cold to the touch (clammy). Be blotchy or pale. Not go back to normal right after you pinch it and let it go. Little or no tears, pee, or sweat. Fast breathing. Low blood pressure. Weak pulse. Pulse that is more than 100 beats a minute when you are sitting still. Other changes, such as: Feeling very thirsty. Eyes that look hollow (sunken). Cold hands and feet. Being confused. Being very  tired (lethargic) or having trouble waking from sleep. Losing weight. Loss of consciousness. How is this treated? Treatment for this condition depends on how bad your dehydration is. Treatment should start right away. Do not wait until your condition gets very bad. Very bad dehydration is an emergency. You will need to go to a hospital. Mild or worse dehydration can be treated at home. You may be asked to: Drink more fluids. Drink an oral rehydration solution (ORS). This drink gives you the right amount of fluids, salts, and minerals (electrolytes). Very bad dehydration can be treated: With fluids through an IV tube. By correcting low levels of electrolytes in the body. By treating the problem that caused your dehydration. Follow these instructions at home: Oral rehydration solution If told by your doctor, drink an ORS: Make an ORS. Use instructions on the package. Start by drinking small amounts, about  cup (120 mL) every 5-10 minutes. Slowly drink more until you have had the amount that your doctor said to have.  Eating and drinking  Drink enough clear fluid to keep your pee pale yellow. If you were told to drink an ORS, finish the ORS first. Then, start slowly drinking other clear fluids. Drink fluids such as: Water. Do not drink only water. Doing that can make the salt (sodium) level in your body get too low. Water from ice chips you suck on. Fruit juice that you have added water to (diluted). Low-calorie sports drinks. Eat foods that have the right amounts of salts and minerals, such as bananas, oranges, potatoes,   tomatoes, or spinach. Do not drink alcohol. Avoid drinks that have caffeine or sugar. These include:: High-calorie sports drinks. Fruit juice that you did not add water to. Soda. Coffee or energy drinks. Avoid foods that are greasy or have a lot of fat or sugar. General instructions Take over-the-counter and prescription medicines only as told by your doctor. Do  not take sodium tablets. Doing that can make the salt level in your body get too high. Return to your normal activities as told by your doctor. Ask your doctor what activities are safe for you. Keep all follow-up visits. Your doctor may check and change your treatment. Contact a doctor if: You have pain in your belly (abdomen) and the pain: Gets worse. Stays in one place. You have a rash. You have a stiff neck. You get angry or annoyed more easily than normal. You are more tired or have a harder time waking than normal. You feel weak or dizzy. You feel very thirsty. Get help right away if: You have any symptoms of very bad dehydration. You vomit every time you eat or drink. Your vomiting gets worse, does not go away, or you vomit blood or green stuff. You are getting treatment, but symptoms are getting worse. You have a fever. You have a very bad headache. You have: Diarrhea that gets worse or does not go away. Blood in your poop (stool). This may cause poop to look black and tarry. No pee in 6-8 hours. Only a small amount of pee in 6-8 hours, and the pee is very dark. You have trouble breathing. These symptoms may be an emergency. Get help right away. Call 911. Do not wait to see if the symptoms will go away. Do not drive yourself to the hospital. This information is not intended to replace advice given to you by your health care provider. Make sure you discuss any questions you have with your health care provider. Document Revised: 11/17/2021 Document Reviewed: 11/17/2021 Elsevier Patient Education  2023 [employer].  

## 2022-08-26 ENCOUNTER — Other Ambulatory Visit: Payer: Self-pay | Admitting: Medical Oncology

## 2022-08-28 ENCOUNTER — Inpatient Hospital Stay: Payer: Medicare Other

## 2022-08-28 VITALS — BP 149/62 | HR 67 | Temp 97.5°F | Resp 17

## 2022-08-28 DIAGNOSIS — E860 Dehydration: Secondary | ICD-10-CM

## 2022-08-28 DIAGNOSIS — Z5112 Encounter for antineoplastic immunotherapy: Secondary | ICD-10-CM | POA: Diagnosis not present

## 2022-08-28 MED ORDER — SODIUM CHLORIDE 0.9 % IV SOLN: Freq: Once | INTRAVENOUS | Status: AC

## 2022-08-28 NOTE — Patient Instructions (Signed)
Dehydration, Adult Dehydration is a condition in which there is not enough water or other fluids in the body. This happens when a person loses more fluids than they take in. Important organs cannot work right without the right amount of fluids. Any loss of fluids from the body can cause dehydration. Dehydration can be mild, worse, or very bad. It should be treated right away to keep it from getting very bad. What are the causes? Conditions that cause loss of water in the body. They include: Watery poop (diarrhea). Vomiting. Sweating a lot. Fever. Infection. Peeing (urinating) a lot. Not drinking enough fluids. Certain medicines, such as medicines that take extra fluid out of the body (diuretics). Lack of safe drinking water. Not being able to get enough water and food. What increases the risk? Having a long-term (chronic) illness that has not been treated the right way, such as: Diabetes. Heart disease. Kidney disease. Being 65 years of age or older. Having a disability. Living in a place that is high above the ground or sea (high in altitude). The thinner, drier air causes more fluid loss. Doing exercises that put stress on your body for a long time. Being active when in hot places. What are the signs or symptoms? Symptoms of dehydration depend on how bad it is. Mild or worse dehydration Thirst. Dry lips or dry mouth. Feeling dizzy or light-headed. Muscle cramps. Passing little pee or dark pee. Pee may be the color of tea. Headache. Very bad dehydration Changes in skin. Skin may: Be cold to the touch (clammy). Be blotchy or pale. Not go back to normal right after you pinch it and let it go. Little or no tears, pee, or sweat. Fast breathing. Low blood pressure. Weak pulse. Pulse that is more than 100 beats a minute when you are sitting still. Other changes, such as: Feeling very thirsty. Eyes that look hollow (sunken). Cold hands and feet. Being confused. Being very  tired (lethargic) or having trouble waking from sleep. Losing weight. Loss of consciousness. How is this treated? Treatment for this condition depends on how bad your dehydration is. Treatment should start right away. Do not wait until your condition gets very bad. Very bad dehydration is an emergency. You will need to go to a hospital. Mild or worse dehydration can be treated at home. You may be asked to: Drink more fluids. Drink an oral rehydration solution (ORS). This drink gives you the right amount of fluids, salts, and minerals (electrolytes). Very bad dehydration can be treated: With fluids through an IV tube. By correcting low levels of electrolytes in the body. By treating the problem that caused your dehydration. Follow these instructions at home: Oral rehydration solution If told by your doctor, drink an ORS: Make an ORS. Use instructions on the package. Start by drinking small amounts, about  cup (120 mL) every 5-10 minutes. Slowly drink more until you have had the amount that your doctor said to have.  Eating and drinking  Drink enough clear fluid to keep your pee pale yellow. If you were told to drink an ORS, finish the ORS first. Then, start slowly drinking other clear fluids. Drink fluids such as: Water. Do not drink only water. Doing that can make the salt (sodium) level in your body get too low. Water from ice chips you suck on. Fruit juice that you have added water to (diluted). Low-calorie sports drinks. Eat foods that have the right amounts of salts and minerals, such as bananas, oranges, potatoes,   tomatoes, or spinach. Do not drink alcohol. Avoid drinks that have caffeine or sugar. These include:: High-calorie sports drinks. Fruit juice that you did not add water to. Soda. Coffee or energy drinks. Avoid foods that are greasy or have a lot of fat or sugar. General instructions Take over-the-counter and prescription medicines only as told by your doctor. Do  not take sodium tablets. Doing that can make the salt level in your body get too high. Return to your normal activities as told by your doctor. Ask your doctor what activities are safe for you. Keep all follow-up visits. Your doctor may check and change your treatment. Contact a doctor if: You have pain in your belly (abdomen) and the pain: Gets worse. Stays in one place. You have a rash. You have a stiff neck. You get angry or annoyed more easily than normal. You are more tired or have a harder time waking than normal. You feel weak or dizzy. You feel very thirsty. Get help right away if: You have any symptoms of very bad dehydration. You vomit every time you eat or drink. Your vomiting gets worse, does not go away, or you vomit blood or green stuff. You are getting treatment, but symptoms are getting worse. You have a fever. You have a very bad headache. You have: Diarrhea that gets worse or does not go away. Blood in your poop (stool). This may cause poop to look black and tarry. No pee in 6-8 hours. Only a small amount of pee in 6-8 hours, and the pee is very dark. You have trouble breathing. These symptoms may be an emergency. Get help right away. Call 911. Do not wait to see if the symptoms will go away. Do not drive yourself to the hospital. This information is not intended to replace advice given to you by your health care provider. Make sure you discuss any questions you have with your health care provider. Document Revised: 11/17/2021 Document Reviewed: 11/17/2021 Elsevier Patient Education  2023 [employer].  

## 2022-08-31 ENCOUNTER — Other Ambulatory Visit: Payer: Self-pay | Admitting: Medical Oncology

## 2022-08-31 ENCOUNTER — Telehealth: Payer: Self-pay | Admitting: Medical Oncology

## 2022-08-31 DIAGNOSIS — C649 Malignant neoplasm of unspecified kidney, except renal pelvis: Secondary | ICD-10-CM

## 2022-08-31 DIAGNOSIS — E860 Dehydration: Secondary | ICD-10-CM

## 2022-08-31 NOTE — Telephone Encounter (Signed)
"  Poor appetite and gagging a lot". She asked for IVF. Message sent to [facility].

## 2022-09-01 ENCOUNTER — Inpatient Hospital Stay: Payer: Medicare Other

## 2022-09-01 VITALS — BP 143/75 | HR 70 | Temp 97.7°F | Resp 18

## 2022-09-01 DIAGNOSIS — Z5112 Encounter for antineoplastic immunotherapy: Secondary | ICD-10-CM | POA: Diagnosis not present

## 2022-09-01 DIAGNOSIS — E860 Dehydration: Secondary | ICD-10-CM

## 2022-09-01 MED ORDER — SODIUM CHLORIDE 0.9 % IV SOLN: Freq: Once | INTRAVENOUS | Status: AC

## 2022-09-01 NOTE — Patient Instructions (Signed)

## 2022-09-02 ENCOUNTER — Telehealth: Payer: Self-pay | Admitting: Internal Medicine

## 2022-09-02 NOTE — Telephone Encounter (Signed)
Called patient regarding upcoming May/June appointments, patient is notified.  

## 2022-09-03 ENCOUNTER — Telehealth: Payer: Self-pay | Admitting: Medical Oncology

## 2022-09-03 ENCOUNTER — Other Ambulatory Visit: Payer: Self-pay | Admitting: Medical Oncology

## 2022-09-03 DIAGNOSIS — E860 Dehydration: Secondary | ICD-10-CM

## 2022-09-03 NOTE — Telephone Encounter (Signed)
Can pt received IVF next Tuesday? It helped him so much this I week.  Message sent to med center for appt. Orders entered under sign and held.

## 2022-09-04 ENCOUNTER — Inpatient Hospital Stay: Payer: Medicare Other | Attending: Oncology

## 2022-09-04 DIAGNOSIS — K219 Gastro-esophageal reflux disease without esophagitis: Secondary | ICD-10-CM | POA: Insufficient documentation

## 2022-09-04 DIAGNOSIS — Z905 Acquired absence of kidney: Secondary | ICD-10-CM | POA: Insufficient documentation

## 2022-09-04 DIAGNOSIS — C649 Malignant neoplasm of unspecified kidney, except renal pelvis: Secondary | ICD-10-CM | POA: Insufficient documentation

## 2022-09-04 DIAGNOSIS — Z5112 Encounter for antineoplastic immunotherapy: Secondary | ICD-10-CM | POA: Diagnosis present

## 2022-09-04 DIAGNOSIS — E860 Dehydration: Secondary | ICD-10-CM | POA: Diagnosis not present

## 2022-09-04 DIAGNOSIS — C787 Secondary malignant neoplasm of liver and intrahepatic bile duct: Secondary | ICD-10-CM | POA: Diagnosis not present

## 2022-09-04 DIAGNOSIS — C7951 Secondary malignant neoplasm of bone: Secondary | ICD-10-CM | POA: Insufficient documentation

## 2022-09-04 DIAGNOSIS — I10 Essential (primary) hypertension: Secondary | ICD-10-CM | POA: Diagnosis not present

## 2022-09-04 MED ORDER — SODIUM CHLORIDE 0.9 % IV SOLN: Freq: Once | INTRAVENOUS | Status: AC

## 2022-09-04 NOTE — Patient Instructions (Signed)

## 2022-09-07 ENCOUNTER — Encounter (HOSPITAL_COMMUNITY): Payer: Self-pay

## 2022-09-07 ENCOUNTER — Other Ambulatory Visit (HOSPITAL_COMMUNITY): Payer: Self-pay

## 2022-09-07 ENCOUNTER — Ambulatory Visit (HOSPITAL_COMMUNITY)
Admission: RE | Admit: 2022-09-07 | Discharge: 2022-09-07 | Disposition: A | Discharge status: Home / Self Care | Payer: Medicare Other | Source: Ambulatory Visit | Attending: Internal Medicine | Admitting: Internal Medicine

## 2022-09-07 DIAGNOSIS — I700 Atherosclerosis of aorta: Secondary | ICD-10-CM | POA: Diagnosis not present

## 2022-09-07 DIAGNOSIS — C641 Malignant neoplasm of right kidney, except renal pelvis: Secondary | ICD-10-CM | POA: Diagnosis present

## 2022-09-07 DIAGNOSIS — N400 Enlarged prostate without lower urinary tract symptoms: Secondary | ICD-10-CM | POA: Insufficient documentation

## 2022-09-07 DIAGNOSIS — C787 Secondary malignant neoplasm of liver and intrahepatic bile duct: Secondary | ICD-10-CM | POA: Diagnosis not present

## 2022-09-07 DIAGNOSIS — R918 Other nonspecific abnormal finding of lung field: Secondary | ICD-10-CM | POA: Insufficient documentation

## 2022-09-07 DIAGNOSIS — C649 Malignant neoplasm of unspecified kidney, except renal pelvis: Secondary | ICD-10-CM | POA: Insufficient documentation

## 2022-09-07 NOTE — Progress Notes (Unsigned)
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   PRIOR THERAPY:  1) Status post right open radical nephrectomy completed on October 15, 2021 by Dr. Jettie Pagan.  2) Keytruda 200 Mg IV every 3 weeks started December 03, 2021.  Status post 9 cycles.  Last dose was given May 21, 2022.  This was discontinued secondary to disease progression   CURRENT THERAPY: Systemic treatment with nivolumab 480 Mg IV every 4 weeks in addition to Cabometyx 40 mg p.o. daily.  First dose June 18, 2022.  Cabometyx on hold since 06/18/22 due to fatigue, weakness, and n/v after cycle 2    CURRENT THERAPY: Micheal Mcintyre returns for follow up as scheduled. Last seen by Dr. Arbutus Ped 08/13/22. Cabometyx was held, he proceeded with Nivo. He came several times for fluids, felt better enough to restart cabometyx 4/17, but called back 4/22 reporting more weakness and sleeping a lot. He had restaging CT on 5/6.   INTERVAL HISTORY   ROS   Past Medical History:  Diagnosis Date   Arthritis    GERD (gastroesophageal reflux disease)    Hypertension    Medicare annual wellness visit, subsequent 08/19/2014   Osteoarthritis 09/21/2007   Qualifier: Diagnosis of  By: Alphonzo Severance MD, Ivar Drape R    PONV (postoperative nausea and vomiting)    Preventative health care 06/07/2016   Right shoulder pain 12/03/2016     Past Surgical History:  Procedure Laterality Date   CHOLECYSTECTOMY     HIP SURGERY     JOINT REPLACEMENT     NEPHRECTOMY Right 10/15/2021   Procedure: NEPHRECTOMY, OPEN;  Surgeon: Jannifer Hick, MD;  Location: [facility];  Service: Urology;  Laterality: Right;   nose skin cancer removal     TOTAL HIP ARTHROPLASTY       Outpatient Encounter Medications as of 09/09/2022  Medication  Sig   acetaminophen (TYLENOL) 500 MG tablet Take 1,000 mg by mouth every 6 (six) hours as needed for moderate pain.   aluminum hydroxide-magnesium carbonate (GAVISCON) 95-358 MG/15ML SUSP Take 15 mLs by mouth as needed for heartburn or indigestion.   amLODipine (NORVASC) 5 MG tablet Take 5 mg by mouth daily.   cabozantinib (CABOMETYX) 40 MG tablet Take 1 tablet (40 mg total) by mouth daily. Take on an empty stomach, 1 hour before or 2 hours after meals.   Cholecalciferol (VITAMIN D3) 10 MCG (400 UNIT) tablet Take 800 Units by mouth daily.   diclofenac Sodium (VOLTAREN) 1 % GEL Apply 1 application. topically 4 (four) times daily as needed (pain).   ferrous sulfate 325 (65 FE) MG tablet Take 325 mg by mouth 4 (four) times a week.   ibuprofen (ADVIL) 200 MG tablet Take 200 mg by mouth daily.   Multiple Vitamin (MULTIVITAMIN WITH MINERALS) TABS tablet Take 1 tablet by mouth 4 (four) times a week.   Omega-3 350 MG CPDR Take 350 mg by mouth daily.   prochlorperazine (COMPAZINE) 10 MG tablet Take 1 tablet (10 mg total) by mouth every 6 (six) hours as needed for nausea or vomiting.   rosuvastatin (CRESTOR) 20 MG tablet Take 0.5 tablets (10 mg total) by mouth daily.   RSV vaccine recomb adjuvanted (AREXVY) 120 MCG/0.5ML injection Inject into the muscle.   telmisartan (  MICARDIS) 80 MG tablet Take 0.5 tablets (40 mg total) by mouth 2 (two) times daily.   TURMERIC PO Take 1 tablet by mouth 3 (three) times a week.   No facility-administered encounter medications on file as of 09/09/2022.     There were no vitals filed for this visit. There is no height or weight on file to calculate BMI.   PHYSICAL EXAM GENERAL:alert, no distress and comfortable SKIN: no rash  EYES: sclera clear NECK: without mass LYMPH:  no palpable cervical or supraclavicular lymphadenopathy  LUNGS: clear with normal breathing effort HEART: regular rate & rhythm, no lower extremity edema ABDOMEN: abdomen soft, non-tender and normal  bowel sounds NEURO: alert & oriented x 3 with fluent speech, no focal motor/sensory deficits Breast exam:  PAC without erythema    CBC    Component Value Date/Time   WBC 4.3 08/13/2022 1043   WBC 9.0 10/17/2021 0330   RBC 3.69 (L) 08/13/2022 1043   HGB 12.1 (L) 08/13/2022 1043   HCT 34.5 (L) 08/13/2022 1043   PLT 122 (L) 08/13/2022 1043   MCV 93.5 08/13/2022 1043   MCH 32.8 08/13/2022 1043   MCHC 35.1 08/13/2022 1043   RDW 13.5 08/13/2022 1043   LYMPHSABS 0.6 (L) 08/13/2022 1043   MONOABS 0.5 08/13/2022 1043   EOSABS 0.0 08/13/2022 1043   BASOSABS 0.0 08/13/2022 1043     CMP     Component Value Date/Time   NA 133 (L) 08/13/2022 1043   NA 140 04/22/2022 0000   K 4.2 08/13/2022 1043   CL 101 08/13/2022 1043   CO2 24 08/13/2022 1043   GLUCOSE 169 (H) 08/13/2022 1043   BUN 29 (H) 08/13/2022 1043   BUN 21 04/22/2022 0000   CREATININE 1.26 (H) 08/13/2022 1043   CREATININE 1.09 02/06/2020 1306   CALCIUM 9.1 08/13/2022 1043   PROT 6.6 08/13/2022 1043   ALBUMIN 3.5 08/13/2022 1043   AST 23 08/13/2022 1043   ALT 34 08/13/2022 1043   ALKPHOS 104 08/13/2022 1043   BILITOT 0.8 08/13/2022 1043   GFRNONAA 58 (L) 08/13/2022 1043   GFRAA 78 09/21/2007 0000     ASSESSMENT & PLAN:  PLAN:  No orders of the defined types were placed in this encounter.     All questions were answered. The patient knows to call the clinic with any problems, questions or concerns. No barriers to learning were detected. I spent *** counseling the patient face to face. The total time spent in the appointment was *** and more than 50% was on counseling, review of test results, and coordination of care.   Santiago Glad, NP-C @DATE @

## 2022-09-08 ENCOUNTER — Telehealth: Payer: Self-pay | Admitting: *Deleted

## 2022-09-08 ENCOUNTER — Other Ambulatory Visit: Payer: Self-pay | Admitting: Internal Medicine

## 2022-09-08 ENCOUNTER — Inpatient Hospital Stay: Payer: Medicare Other

## 2022-09-08 ENCOUNTER — Other Ambulatory Visit (HOSPITAL_COMMUNITY): Payer: Self-pay

## 2022-09-08 NOTE — Telephone Encounter (Signed)
Patient called to cancel his appointment today. He said that he is not feeling and will call back to reschedule.

## 2022-09-09 ENCOUNTER — Encounter: Payer: Self-pay | Admitting: Nurse Practitioner

## 2022-09-09 ENCOUNTER — Inpatient Hospital Stay (HOSPITAL_BASED_OUTPATIENT_CLINIC_OR_DEPARTMENT_OTHER): Payer: Medicare Other | Admitting: Nurse Practitioner

## 2022-09-09 ENCOUNTER — Other Ambulatory Visit: Payer: Self-pay

## 2022-09-09 ENCOUNTER — Inpatient Hospital Stay: Payer: Medicare Other

## 2022-09-09 ENCOUNTER — Other Ambulatory Visit (HOSPITAL_COMMUNITY): Payer: Self-pay

## 2022-09-09 VITALS — BP 160/74 | HR 96 | Resp 18

## 2022-09-09 DIAGNOSIS — Z5112 Encounter for antineoplastic immunotherapy: Secondary | ICD-10-CM | POA: Diagnosis not present

## 2022-09-09 DIAGNOSIS — C649 Malignant neoplasm of unspecified kidney, except renal pelvis: Secondary | ICD-10-CM | POA: Diagnosis not present

## 2022-09-09 DIAGNOSIS — E860 Dehydration: Secondary | ICD-10-CM

## 2022-09-09 LAB — CMP (CANCER CENTER ONLY)
ALT: 48 U/L — ABNORMAL HIGH (ref 0–44)
AST: 39 U/L (ref 15–41)
Albumin: 3.4 g/dL — ABNORMAL LOW (ref 3.5–5.0)
Alkaline Phosphatase: 106 U/L (ref 38–126)
Anion gap: 8 (ref 5–15)
BUN: 12 mg/dL (ref 8–23)
CO2: 27 mmol/L (ref 22–32)
Calcium: 8.7 mg/dL — ABNORMAL LOW (ref 8.9–10.3)
Chloride: 103 mmol/L (ref 98–111)
Creatinine: 1.11 mg/dL (ref 0.61–1.24)
GFR, Estimated: >60 mL/min (ref >60)
Glucose, Bld: 138 mg/dL — ABNORMAL HIGH (ref 70–99)
Potassium: 4.1 mmol/L (ref 3.5–5.1)
Sodium: 138 mmol/L (ref 135–145)
Total Bilirubin: 0.8 mg/dL (ref 0.3–1.2)
Total Protein: 6.2 g/dL — ABNORMAL LOW (ref 6.5–8.1)

## 2022-09-09 LAB — CBC WITH DIFFERENTIAL (CANCER CENTER ONLY)
Abs Immature Granulocytes: 0.01 10*3/uL (ref 0.00–0.07)
Basophils Absolute: 0 10*3/uL (ref 0.0–0.1)
Basophils Relative: 0 %
Eosinophils Absolute: 0.1 10*3/uL (ref 0.0–0.5)
Eosinophils Relative: 2 %
HCT: 39.8 % (ref 39.0–52.0)
Hemoglobin: 13.6 g/dL (ref 13.0–17.0)
Immature Granulocytes: 0 %
Lymphocytes Relative: 23 %
Lymphs Abs: 0.9 10*3/uL (ref 0.7–4.0)
MCH: 33.3 pg (ref 26.0–34.0)
MCHC: 34.2 g/dL (ref 30.0–36.0)
MCV: 97.5 fL (ref 80.0–100.0)
Monocytes Absolute: 0.3 10*3/uL (ref 0.1–1.0)
Monocytes Relative: 7 %
Neutro Abs: 2.8 10*3/uL (ref 1.7–7.7)
Neutrophils Relative %: 68 %
Platelet Count: 117 10*3/uL — ABNORMAL LOW (ref 150–400)
RBC: 4.08 MIL/uL — ABNORMAL LOW (ref 4.22–5.81)
RDW: 14.6 % (ref 11.5–15.5)
WBC Count: 4.1 10*3/uL (ref 4.0–10.5)
nRBC: 0 % (ref 0.0–0.2)

## 2022-09-09 MED ORDER — DRONABINOL 2.5 MG PO CAPS: 2.5000 mg | ORAL_CAPSULE | Freq: Two times a day (BID) | ORAL | 0 refills | Status: DC

## 2022-09-09 MED ORDER — ONDANSETRON HCL 8 MG PO TABS: 8.0000 mg | ORAL_TABLET | Freq: Three times a day (TID) | ORAL | 3 refills | Status: DC | PRN

## 2022-09-09 MED ORDER — CABOZANTINIB S-MALATE 20 MG PO TABS
20.0000 mg | ORAL_TABLET | Freq: Every day | ORAL | 3 refills | Status: DC
  Filled 2022-09-09: qty 30, 30d supply, fill #0

## 2022-09-09 MED ORDER — SODIUM CHLORIDE 0.9 % IV SOLN: Freq: Once | INTRAVENOUS | Status: AC

## 2022-09-09 MED ORDER — SODIUM CHLORIDE 0.9 % IV SOLN
480.0000 mg | Freq: Once | INTRAVENOUS | Status: AC
  Administered 2022-09-09: 480 mg via INTRAVENOUS
  Filled 2022-09-09: qty 48

## 2022-09-09 NOTE — Patient Instructions (Signed)
[facility]  Discharge Instructions: Thank you for choosing [facility] to provide your oncology and hematology care.   If you have a lab appointment with the Cancer Center, please go directly to the Cancer Center and check in at the registration area.   Wear comfortable clothing and clothing appropriate for easy access to any Portacath or PICC line.   We strive to give you quality time with your provider. You may need to reschedule your appointment if you arrive late (15 or more minutes).  Arriving late affects you and other patients whose appointments are after yours.  Also, if you miss three or more appointments without notifying the office, you may be dismissed from the clinic at the provider's discretion.      For prescription refill requests, have your pharmacy contact our office and allow 72 hours for refills to be completed.    Today you received the following chemotherapy and/or immunotherapy agents: Opdivo.       To help prevent nausea and vomiting after your treatment, we encourage you to take your nausea medication as directed.  BELOW ARE SYMPTOMS THAT SHOULD BE REPORTED IMMEDIATELY: *FEVER GREATER THAN 100.4 F (38 C) OR HIGHER *CHILLS OR SWEATING *NAUSEA AND VOMITING THAT IS NOT CONTROLLED WITH YOUR NAUSEA MEDICATION *UNUSUAL SHORTNESS OF BREATH *UNUSUAL BRUISING OR BLEEDING *URINARY PROBLEMS (pain or burning when urinating, or frequent urination) *BOWEL PROBLEMS (unusual diarrhea, constipation, pain near the anus) TENDERNESS IN MOUTH AND THROAT WITH OR WITHOUT PRESENCE OF ULCERS (sore throat, sores in mouth, or a toothache) UNUSUAL RASH, SWELLING OR PAIN  UNUSUAL VAGINAL DISCHARGE OR ITCHING   Items with * indicate a potential emergency and should be followed up as soon as possible or go to the Emergency Department if any problems should occur.  Please show the CHEMOTHERAPY ALERT CARD or IMMUNOTHERAPY ALERT CARD at  check-in to the Emergency Department and triage nurse.  Should you have questions after your visit or need to cancel or reschedule your appointment, please contact [facility]  Dept: [phone]  and follow the prompts.  Office hours are 8:00 a.m. to 4:30 p.m. Monday - Friday. Please note that voicemails left after 4:00 p.m. may not be returned until the following business day.  We are closed weekends and major holidays. You have access to a nurse at all times for urgent questions. Please call the main number to the clinic Dept: [phone] and follow the prompts.   For any non-urgent questions, you may also contact your provider using MyChart. We now offer e-Visits for anyone 51 and older to request care online for non-urgent symptoms. For details visit [email or web address].   Also download the MyChart app! Go to the app store, search "MyChart", open the app, select [facility], and log in with your MyChart username and password.   Dehydration, Adult Dehydration is a condition in which there is not enough water or other fluids in the body. This happens when a person loses more fluids than they take in. Important organs, such as the kidneys, brain, and heart, cannot function without a proper amount of fluids. Any loss of fluids from the body can lead to dehydration. Dehydration can be mild, moderate, or severe. It should be treated right away to prevent it from becoming severe. What are the causes? Dehydration may be caused by: Health conditions, such as diarrhea, vomiting, fever, infection, or sweating or urinating a lot. Not drinking enough  fluids. Certain medicines, such as medicines that remove excess fluid from the body (diuretics). Lack of safe drinking water. Not being able to get enough water and food. What increases the risk? The following factors may make you more likely to develop this condition: Having a long-term (chronic) illness that has  not been treated properly, such as diabetes, heart disease, or kidney disease. Being 23 years of age or older. Having a disability. Living in a place that is high in altitude, where thinner, drier air causes more fluid loss. Doing exercises that put stress on your body for a long time (endurance sports). Being active in a hot climate. What are the signs or symptoms? Symptoms of dehydration depend on how severe it is. Mild or moderate dehydration Thirst. Dry lips or dry mouth. Dizziness or light-headedness. Muscle cramps. Dark urine. Urine may be the color of tea. Less urine or tears produced than usual. Headache. Severe dehydration Changes in skin. Your skin may be cold and clammy, blotchy, or pale. Your skin also may not return to normal after being lightly pinched and released. Little or no tears, urine, or sweat. Rapid breathing and low blood pressure. Your pulse may be weak or may be faster than 100 beats per minute when you are sitting still. Other changes, such as: Feeling very thirsty. Sunken eyes. Cold hands and feet. Confusion. Being very tired (lethargic) or having trouble waking from sleep. Short-term weight loss. Loss of consciousness. How is this diagnosed? This condition is diagnosed based on your symptoms and a physical exam. You may have blood and urine tests to help confirm the diagnosis. How is this treated? Treatment for this condition depends on how severe it is. Treatment should be started right away. Do not wait until dehydration becomes severe. Severe dehydration is an emergency and needs to be treated in a hospital. Mild or moderate dehydration can be treated at home. You may be asked to: Drink more fluids. Drink an oral rehydration solution (ORS). This drink restores fluids, salts, and minerals in the blood (electrolytes). Stop any activities that caused dehydration, such as exercise. Cool off with cool compresses, cool mist, or cool fluids, if heat or too  much sweat caused your condition. Take medicine to treat fever, if fever caused your condition. Take medicine to treat nausea and diarrhea, if vomiting or diarrhea caused your condition. Severe dehydration can be treated: With IV fluids. By correcting abnormal levels of electrolytes in your body. By treating the underlying cause of dehydration. Follow these instructions at home: Oral rehydration solution If told by your health care provider, drink an ORS: Make an ORS by following instructions on the package. Start by drinking small amounts, about  cup (120 mL) every 5-10 minutes. Slowly increase how much you drink until you have taken the amount recommended by your health care provider.  Eating and drinking  Drink enough clear fluid to keep your urine pale yellow. If you were told to drink an ORS, finish the ORS first and then start slowly drinking other clear fluids. Drink fluids such as: Water. Do not drink only water. Doing that can lead to hyponatremia, which is having too little salt (sodium) in the body. Water from ice chips you suck on. Diluted fruit juice. This is fruit juice that you have added water to. Low-calorie sports drinks. Eat foods that contain a healthy balance of electrolytes, such as bananas, oranges, potatoes, tomatoes, and spinach. Do not drink alcohol. Avoid the following: Drinks that contain a lot of  sugar. These include high-calorie sports drinks, fruit juice that is not diluted, and soda. Caffeine. Foods that are greasy or contain a lot of fat or sugar. General instructions Take over-the-counter and prescription medicines only as told by your health care provider. Do not take sodium tablets. Doing that can lead to having too much sodium in the body (hypernatremia). Return to your normal activities as told by your health care provider. Ask your health care provider what activities are safe for you. Keep all follow-up visits. Your health care provider may need  to check your progress and suggest new ways to treat your condition. Contact a health care provider if: You have muscle cramps, pain, or discomfort, such as: Pain in your abdomen and the pain gets worse or stays in one area. Stiff neck. You have a rash. You are more irritable than usual. You are sleepier or have a harder time waking. You feel weak or dizzy. You feel very thirsty. Get help right away if: You have symptoms of severe dehydration. You vomit every time you eat or drink. Your vomiting gets worse, does not go away, or includes blood or green matter (bile). You are getting treatment but symptoms are getting worse. You have a fever. You have a severe headache. You have: Diarrhea that gets worse or does not go away. Blood in your stool. This may cause stool to look black and tarry. Not urinating, or urinating only a small amount of very dark urine, within 6-8 hours. You have trouble breathing. These symptoms may be an emergency. Get help right away. Do not wait to see if the symptoms will go away. Do not drive yourself to the hospital. Call 911. This information is not intended to replace advice given to you by your health care provider. Make sure you discuss any questions you have with your health care provider. Document Revised: 11/17/2021 Document Reviewed: 11/17/2021 Elsevier Patient Education  2023 [employer].

## 2022-09-10 ENCOUNTER — Other Ambulatory Visit: Payer: Medicare Other

## 2022-09-10 ENCOUNTER — Ambulatory Visit: Payer: Medicare Other

## 2022-09-10 ENCOUNTER — Ambulatory Visit: Payer: Medicare Other | Admitting: Internal Medicine

## 2022-09-11 ENCOUNTER — Other Ambulatory Visit: Payer: Self-pay

## 2022-09-16 ENCOUNTER — Telehealth: Payer: Self-pay | Admitting: Medical Oncology

## 2022-09-16 NOTE — Telephone Encounter (Signed)
Diarrhea x 1 episode today after 1 week without it.  Marland Kitchen He will take imodium at next stool. Wife requests IVF on Friday after 12 . Schedule message sent.

## 2022-09-18 ENCOUNTER — Inpatient Hospital Stay: Payer: Medicare Other

## 2022-09-18 VITALS — BP 130/60 | HR 75 | Temp 98.0°F | Resp 18

## 2022-09-18 DIAGNOSIS — E860 Dehydration: Secondary | ICD-10-CM

## 2022-09-18 DIAGNOSIS — Z5112 Encounter for antineoplastic immunotherapy: Secondary | ICD-10-CM | POA: Diagnosis not present

## 2022-09-18 MED ORDER — SODIUM CHLORIDE 0.9 % IV SOLN: Freq: Once | INTRAVENOUS | Status: AC

## 2022-09-18 NOTE — Patient Instructions (Signed)

## 2022-09-21 ENCOUNTER — Other Ambulatory Visit: Payer: Self-pay | Admitting: Medical Oncology

## 2022-09-21 ENCOUNTER — Telehealth: Payer: Self-pay | Admitting: Medical Oncology

## 2022-09-21 ENCOUNTER — Ambulatory Visit: Payer: Medicare Other

## 2022-09-21 DIAGNOSIS — R112 Nausea with vomiting, unspecified: Secondary | ICD-10-CM

## 2022-09-21 DIAGNOSIS — C649 Malignant neoplasm of unspecified kidney, except renal pelvis: Secondary | ICD-10-CM

## 2022-09-21 DIAGNOSIS — R197 Diarrhea, unspecified: Secondary | ICD-10-CM

## 2022-09-21 DIAGNOSIS — E860 Dehydration: Secondary | ICD-10-CM

## 2022-09-21 MED ORDER — SODIUM CHLORIDE 0.9 % IV SOLN: INTRAVENOUS | Status: DC

## 2022-09-21 NOTE — Progress Notes (Signed)
HH referral made

## 2022-09-21 NOTE — Telephone Encounter (Addendum)
Per Dr. Arbutus Ped I told wife he will order IVF today . Pt declined IVF today and [facility]. Denies abdominal pain or cramping. I told her to continue Imodium as needed. Waiting on Marinol prescription to help with his appetite.  I told wife that pt needs to go to ED if his symptoms get worse, he gets dizzy and lightheaded, cannot stop diarrhea and or has associated abdominal pain/cramping.  She voiced understanding.  Per Dr, Arbutus Ped , I told wife that pt needs to hold his Cabometyx until his appt next  week.  IVF scheduled for tomorrow at [facility] and wife notified. Orders entered.

## 2022-09-21 NOTE — Telephone Encounter (Addendum)
Diarrhea again- 9 episodes watery diarrhea between sat , sunday and today . Last episode @ 6 am today . He took Imodium as directed. (Since April 1st wife reported he has had intermittent diarrhea" .   Not eating very much,  sleeps all the time. He fell onto toilet and hurt his back. He is so weak he cannot open his pill bottles.    Cabometyx 20 mg (  lowered dose and started 05/13). On nivolumab.  Nutrition referral sent.

## 2022-09-21 NOTE — Addendum Note (Signed)
Addended by: Charma Igo on: 09/21/2022 12:10 PM   Modules accepted: Orders

## 2022-09-21 NOTE — Progress Notes (Signed)
IVF ordered

## 2022-09-22 ENCOUNTER — Ambulatory Visit: Payer: Medicare Other

## 2022-09-22 ENCOUNTER — Encounter (HOSPITAL_COMMUNITY): Payer: Self-pay | Admitting: Emergency Medicine

## 2022-09-22 ENCOUNTER — Emergency Department (HOSPITAL_COMMUNITY)
Admission: EM | Admit: 2022-09-22 | Discharge: 2022-09-22 | Disposition: A | Discharge status: Home / Self Care | Payer: Medicare Other | Attending: Emergency Medicine | Admitting: Emergency Medicine

## 2022-09-22 ENCOUNTER — Other Ambulatory Visit: Payer: Self-pay

## 2022-09-22 DIAGNOSIS — E876 Hypokalemia: Secondary | ICD-10-CM | POA: Diagnosis not present

## 2022-09-22 DIAGNOSIS — R197 Diarrhea, unspecified: Secondary | ICD-10-CM | POA: Diagnosis present

## 2022-09-22 DIAGNOSIS — Z8505 Personal history of malignant neoplasm of liver: Secondary | ICD-10-CM | POA: Diagnosis not present

## 2022-09-22 DIAGNOSIS — Z85830 Personal history of malignant neoplasm of bone: Secondary | ICD-10-CM | POA: Diagnosis not present

## 2022-09-22 DIAGNOSIS — E860 Dehydration: Secondary | ICD-10-CM | POA: Diagnosis not present

## 2022-09-22 DIAGNOSIS — Z85528 Personal history of other malignant neoplasm of kidney: Secondary | ICD-10-CM | POA: Insufficient documentation

## 2022-09-22 LAB — CBC WITH DIFFERENTIAL/PLATELET
Abs Immature Granulocytes: 0 10*3/uL (ref 0.00–0.07)
Basophils Absolute: 0 10*3/uL (ref 0.0–0.1)
Basophils Relative: 0 %
Eosinophils Absolute: 0 10*3/uL (ref 0.0–0.5)
Eosinophils Relative: 1 %
HCT: 36.5 % — ABNORMAL LOW (ref 39.0–52.0)
Hemoglobin: 12.6 g/dL — ABNORMAL LOW (ref 13.0–17.0)
Immature Granulocytes: 0 %
Lymphocytes Relative: 13 %
Lymphs Abs: 0.6 10*3/uL — ABNORMAL LOW (ref 0.7–4.0)
MCH: 34.1 pg — ABNORMAL HIGH (ref 26.0–34.0)
MCHC: 34.5 g/dL (ref 30.0–36.0)
MCV: 98.9 fL (ref 80.0–100.0)
Monocytes Absolute: 0.5 10*3/uL (ref 0.1–1.0)
Monocytes Relative: 11 %
Neutro Abs: 3.6 10*3/uL (ref 1.7–7.7)
Neutrophils Relative %: 75 %
Platelets: 101 10*3/uL — ABNORMAL LOW (ref 150–400)
RBC: 3.69 MIL/uL — ABNORMAL LOW (ref 4.22–5.81)
RDW: 15.5 % (ref 11.5–15.5)
WBC: 4.8 10*3/uL (ref 4.0–10.5)
nRBC: 0 % (ref 0.0–0.2)

## 2022-09-22 LAB — COMPREHENSIVE METABOLIC PANEL
ALT: 31 U/L (ref 0–44)
AST: 25 U/L (ref 15–41)
Albumin: 2.6 g/dL — ABNORMAL LOW (ref 3.5–5.0)
Alkaline Phosphatase: 105 U/L (ref 38–126)
Anion gap: 11 (ref 5–15)
BUN: 16 mg/dL (ref 8–23)
CO2: 24 mmol/L (ref 22–32)
Calcium: 8.1 mg/dL — ABNORMAL LOW (ref 8.9–10.3)
Chloride: 97 mmol/L — ABNORMAL LOW (ref 98–111)
Creatinine, Ser: 1.48 mg/dL — ABNORMAL HIGH (ref 0.61–1.24)
GFR, Estimated: 48 mL/min — ABNORMAL LOW (ref >60)
Glucose, Bld: 123 mg/dL — ABNORMAL HIGH (ref 70–99)
Potassium: 2.8 mmol/L — ABNORMAL LOW (ref 3.5–5.1)
Sodium: 132 mmol/L — ABNORMAL LOW (ref 135–145)
Total Bilirubin: 1.1 mg/dL (ref 0.3–1.2)
Total Protein: 5.7 g/dL — ABNORMAL LOW (ref 6.5–8.1)

## 2022-09-22 LAB — URINALYSIS, ROUTINE W REFLEX MICROSCOPIC
Bilirubin Urine: NEGATIVE
Glucose, UA: NEGATIVE mg/dL
Hgb urine dipstick: NEGATIVE
Ketones, ur: NEGATIVE mg/dL
Leukocytes,Ua: NEGATIVE
Nitrite: NEGATIVE
Protein, ur: NEGATIVE mg/dL
Specific Gravity, Urine: 1.008 (ref 1.005–1.030)
pH: 6 (ref 5.0–8.0)

## 2022-09-22 LAB — MAGNESIUM: Magnesium: 1.6 mg/dL — ABNORMAL LOW (ref 1.7–2.4)

## 2022-09-22 LAB — LIPASE, BLOOD: Lipase: 23 U/L (ref 11–51)

## 2022-09-22 MED ORDER — POTASSIUM CHLORIDE 10 MEQ/100ML IV SOLN
10.0000 meq | Freq: Once | INTRAVENOUS | Status: AC
  Administered 2022-09-22: 10 meq via INTRAVENOUS
  Filled 2022-09-22: qty 100

## 2022-09-22 MED ORDER — POTASSIUM CHLORIDE CRYS ER 20 MEQ PO TBCR: 40.0000 meq | EXTENDED_RELEASE_TABLET | Freq: Once | ORAL | Status: DC

## 2022-09-22 MED ORDER — POTASSIUM CHLORIDE CRYS ER 20 MEQ PO TBCR: 20.0000 meq | EXTENDED_RELEASE_TABLET | Freq: Every day | ORAL | 0 refills | Status: DC

## 2022-09-22 MED ORDER — SODIUM CHLORIDE 0.9 % IV SOLN: INTRAVENOUS | Status: DC

## 2022-09-22 MED ORDER — MAGNESIUM CHLORIDE 64 MG PO TBEC
1.0000 | DELAYED_RELEASE_TABLET | Freq: Once | ORAL | Status: AC
  Administered 2022-09-22: 64 mg via ORAL
  Filled 2022-09-22 (×2): qty 1

## 2022-09-22 MED ORDER — ACETAMINOPHEN 500 MG PO TABS
1000.0000 mg | ORAL_TABLET | Freq: Once | ORAL | Status: AC
  Administered 2022-09-22: 1000 mg via ORAL
  Filled 2022-09-22: qty 2

## 2022-09-22 MED ORDER — POTASSIUM CHLORIDE CRYS ER 20 MEQ PO TBCR
40.0000 meq | EXTENDED_RELEASE_TABLET | Freq: Once | ORAL | Status: AC
  Administered 2022-09-22: 40 meq via ORAL
  Filled 2022-09-22: qty 2

## 2022-09-22 MED ORDER — MAGNESIUM CHLORIDE 64 MG PO TBEC: 1.0000 | DELAYED_RELEASE_TABLET | Freq: Two times a day (BID) | ORAL | 0 refills | Status: DC

## 2022-09-22 NOTE — ED Notes (Signed)
Pt was able to tolerate PO potassium and additional ice water.   Lab notified of Magnesium add-on.

## 2022-09-22 NOTE — ED Provider Notes (Signed)
[place] EMERGENCY DEPARTMENT AT [facility] Provider Note   CSN: [id number] Arrival date & time: 09/22/22  1242     History  Chief Complaint  Patient presents with   Diarrhea   Weakness    Micheal Mcintyre is a 82 y.o. male.  82 year old male with history of renal cell carcinoma with metastasis to liver and bone. Staging CT completed on 09/07/22. Currently on nivolumab, last dose 09/09/22. He reports loss of appetite, weakness, fatigue. Diarrhea onset a couple weeks ago, has increased over the past day. Denies chest pain, shortness of breath, abdominal pain. Occasional nausea, without vomiting. Taking lomotil for diarrhea with little to no improvement initially. Diarrhea frequency has decreased today  The history is provided by the patient and medical records. No language interpreter was used.  Diarrhea Quality:  Watery Severity:  Severe Number of episodes:  9 Duration:  18 hours Timing:  Constant Progression:  Unchanged Ineffective treatments:  Anti-motility medications Associated symptoms: no abdominal pain, no chills, no recent cough and no fever   Weakness Severity:  Moderate Onset quality:  Gradual Duration:  2 weeks Timing:  Intermittent Progression:  Worsening Context: dehydration   Associated symptoms: diarrhea   Associated symptoms: no abdominal pain, no fever and no shortness of breath        Home Medications Prior to Admission medications   Medication Sig Start Date End Date Taking? Authorizing Provider  acetaminophen (TYLENOL) 500 MG tablet Take 1,000 mg by mouth every 6 (six) hours as needed for moderate pain.    [provider]  aluminum hydroxide-magnesium carbonate (GAVISCON) 95-358 MG/15ML SUSP Take 15 mLs by mouth as needed for heartburn or indigestion. Patient not taking: Reported on 09/09/2022    [provider]  amLODipine (NORVASC) 5 MG tablet Take 5 mg by mouth daily. 04/23/22   [provider]  cabozantinib  (CABOMETYX) 20 MG tablet Take 1 tablet (20 mg total) by mouth daily. Take on an empty stomach, 1 hour before or 2 hours after meals. 09/09/22   Pollyann Samples, NP  Cholecalciferol (VITAMIN D3) 10 MCG (400 UNIT) tablet Take 800 Units by mouth daily.    [provider]  diclofenac Sodium (VOLTAREN) 1 % GEL Apply 1 application. topically 4 (four) times daily as needed (pain).    [provider]  dronabinol (MARINOL) 2.5 MG capsule Take 1 capsule (2.5 mg total) by mouth 2 (two) times daily before a meal. 09/09/22   Pollyann Samples, NP  ferrous sulfate 325 (65 FE) MG tablet Take 325 mg by mouth 4 (four) times a week.    [provider]  ibuprofen (ADVIL) 200 MG tablet Take 200 mg by mouth daily. Patient not taking: Reported on 09/09/2022    [provider]  Multiple Vitamin (MULTIVITAMIN WITH MINERALS) TABS tablet Take 1 tablet by mouth 4 (four) times a week.    [provider]  Omega-3 350 MG CPDR Take 350 mg by mouth daily.    [provider]  ondansetron (ZOFRAN) 8 MG tablet Take 1 tablet (8 mg total) by mouth every 8 (eight) hours as needed for nausea or vomiting. 09/09/22   Pollyann Samples, NP  prochlorperazine (COMPAZINE) 10 MG tablet TAKE 1 TABLET(10 MG) BY MOUTH EVERY 6 HOURS AS NEEDED FOR NAUSEA OR VOMITING 09/08/22   Si Gaul, MD  rosuvastatin (CRESTOR) 20 MG tablet Take 0.5 tablets (10 mg total) by mouth daily. 04/02/22   [name], MD  telmisartan (MICARDIS) 80 MG tablet Take 0.5 tablets (40 mg total) by mouth 2 (two) times daily. 10/13/21   [name], MD  TURMERIC PO Take 1 tablet by mouth 3 (three) times a week.    [provider]      Allergies    Statins    Review of Systems   Review of Systems  Constitutional:  Negative for chills and fever.  Respiratory:  Negative for shortness of breath.   Gastrointestinal:  Positive for diarrhea. Negative for abdominal pain.  Neurological:  Positive for weakness.  All  other systems reviewed and are negative.   Physical Exam Updated Vital Signs BP 119/67   Pulse 80   Resp 20   SpO2 95%  Physical Exam HENT:     Head: Normocephalic.     Nose: Nose normal.     Mouth/Throat:     Mouth: Mucous membranes are moist.  Eyes:     Conjunctiva/sclera: Conjunctivae normal.  Cardiovascular:     Rate and Rhythm: Normal rate.  Pulmonary:     Effort: Pulmonary effort is normal.  Abdominal:     General: Bowel sounds are normal. There is no distension.     Palpations: Abdomen is soft.     Tenderness: There is no abdominal tenderness.  Musculoskeletal:        General: Normal range of motion.  Skin:    General: Skin is warm and dry.  Neurological:     Mental Status: He is alert and oriented to person, place, and time.  Psychiatric:        Mood and Affect: Mood normal.        Behavior: Behavior normal.     ED Results / Procedures / Treatments   Labs (all labs ordered are listed, but only abnormal results are displayed) Labs Reviewed  COMPREHENSIVE METABOLIC PANEL - Abnormal; Notable for the following components:      Result Value   Sodium 132 (*)    Potassium 2.8 (*)    Chloride 97 (*)    Glucose, Bld 123 (*)    Creatinine, Ser 1.48 (*)    Calcium 8.1 (*)    Total Protein 5.7 (*)    Albumin 2.6 (*)    GFR, Estimated 48 (*)    All other components within normal limits  CBC WITH DIFFERENTIAL/PLATELET - Abnormal; Notable for the following components:   RBC 3.69 (*)    Hemoglobin 12.6 (*)    HCT 36.5 (*)    MCH 34.1 (*)    Platelets 101 (*)    Lymphs Abs 0.6 (*)    All other components within normal limits  MAGNESIUM - Abnormal; Notable for the following components:   Magnesium 1.6 (*)    All other components within normal limits  LIPASE, BLOOD  URINALYSIS, ROUTINE W REFLEX MICROSCOPIC    EKG None  Radiology No results found.  Procedures Procedures    Medications Ordered in ED Medications  0.9 %  sodium chloride infusion (  Intravenous New Bag/Given 09/22/22 1327)  potassium chloride SA (KLOR-CON M) CR tablet 40 mEq (40 mEq Oral Given 09/22/22 1442)  potassium chloride 10 mEq in 100 mL IVPB (0 mEq Intravenous Stopped 09/22/22 1648)  potassium chloride 10 mEq in 100 mL IVPB (0 mEq Intravenous Stopped 09/22/22 1815)  magnesium chloride (SLOW-MAG) 64 MG SR tablet 64 mg (64 mg Oral Given 09/22/22 1836)  acetaminophen (TYLENOL) tablet 1,000 mg (1,000 mg Oral Given 09/22/22 1806)    ED  Course/ Medical Decision Making/ A&P                             Medical Decision Making Amount and/or Complexity of Data Reviewed Labs: ordered.  Risk OTC drugs. Prescription drug management.   Patient noted to be hypokalemic with low magnesium level. Hypokalemia likely due to persistent diarrhea over the last few days. Patient feels better after IV fluids and medication. No additional episodes of diarrhea while in the ED. Patient received potassium and magnesium supplementation.  Patient appears safe for discharge at this time. Care instructions and return precautions provided. Prescriptions for potassium and magnesium sent to patient's pharmacy. Patient to follow-up with primary care provider.        Final Clinical Impression(s) / ED Diagnoses Final diagnoses:  Dehydration  Hypokalemia  Diarrhea, unspecified type    Rx / DC Orders ED Discharge Orders          Ordered    potassium chloride SA (KLOR-CON M) 20 MEQ tablet  Daily,   Status:  Discontinued        09/22/22 1857    magnesium chloride (SLOW-MAG) 64 MG TBEC SR tablet  2 times daily        09/22/22 1857    potassium chloride SA (KLOR-CON M) 20 MEQ tablet  Daily        09/22/22 1929              Felicie Morn, NP 09/22/22 2142    Ernie Avena, MD 09/23/22 1557

## 2022-09-22 NOTE — ED Triage Notes (Signed)
Patient presents due to weakness/ fatigue, decreased PO intake and diarrhea which all started a couple of weeks ago. Diarrhea has increased over the past 18 hours. His MD told him to come here.  Last chemo first week of May.

## 2022-09-22 NOTE — ED Notes (Signed)
Pt has not had any episodes of diarrhea since being roomed.

## 2022-09-22 NOTE — ED Notes (Signed)
Pt c/o back pain and requesting to take 1,000mg  Tylenol.  EDP made aware.

## 2022-09-22 NOTE — ED Notes (Signed)
Pt is aware we need a urine sample and provided a urinal.  Pt unable to provide a sample at this time.

## 2022-09-22 NOTE — Discharge Instructions (Addendum)
Please refer to the attached instructions. Take medications as directed. Follow-up with your primary care provider.

## 2022-09-24 ENCOUNTER — Telehealth: Payer: Self-pay | Admitting: Medical Oncology

## 2022-09-24 NOTE — Telephone Encounter (Signed)
Diarrhea - Wife reported pt is improved > less diarrhea since getting IVF and IV K+ in the ED. He is not taking the Cabometyx. Appt 06/05.

## 2022-09-25 ENCOUNTER — Telehealth: Payer: Self-pay | Admitting: Medical Oncology

## 2022-09-25 ENCOUNTER — Telehealth: Payer: Self-pay | Admitting: *Deleted

## 2022-09-25 ENCOUNTER — Encounter: Payer: Self-pay | Admitting: Internal Medicine

## 2022-09-25 NOTE — Telephone Encounter (Signed)
A user error has taken place.

## 2022-09-25 NOTE — Telephone Encounter (Signed)
Received call from pt's wife. She is asking if her husband can get IV fluids today or Monday. Advised that it is too late in the day for IV fluids. Asked her what was going on with her husband. She states he was in the ED on Tuesday and his K+ was low and he received IV fluids and IV potassium at that time as he was having diarrhea.  Asked her if he was still having diarrhea. She states he is not. He was given 3 doses of PO K+ to take this week. She states he has 1 left. Advised that he should take that tablet and then as tolerated, increase K+ in his diet with bananas, apricot, V8, leafy greens. Also advised to increase po fluid intake-he has been drinking pedialyte. She asked if he could have fluids on Monday, 09/28/22. Advised that we are closed for [facility] as are any of the other cancer centers. She states he has an appt with his PCP @ the [facility] on 09/29/22. Advised that his PCP can recheck his labs, VS etc and see if he needs fluids at that time. Asdvised that if his diarrhea comes back over the weekend and it cannot be controlled with Imodium that he should go back to the ED.   She voiced understanding

## 2022-09-26 ENCOUNTER — Emergency Department (HOSPITAL_BASED_OUTPATIENT_CLINIC_OR_DEPARTMENT_OTHER): Payer: Medicare Other

## 2022-09-26 ENCOUNTER — Other Ambulatory Visit: Payer: Self-pay

## 2022-09-26 ENCOUNTER — Emergency Department (HOSPITAL_BASED_OUTPATIENT_CLINIC_OR_DEPARTMENT_OTHER)
Admission: EM | Admit: 2022-09-26 | Discharge: 2022-09-26 | Disposition: A | Discharge status: Home / Self Care | Payer: Medicare Other | Attending: Emergency Medicine | Admitting: Emergency Medicine

## 2022-09-26 DIAGNOSIS — E876 Hypokalemia: Secondary | ICD-10-CM | POA: Diagnosis not present

## 2022-09-26 DIAGNOSIS — R197 Diarrhea, unspecified: Secondary | ICD-10-CM | POA: Diagnosis present

## 2022-09-26 DIAGNOSIS — C220 Liver cell carcinoma: Secondary | ICD-10-CM | POA: Diagnosis not present

## 2022-09-26 DIAGNOSIS — E860 Dehydration: Secondary | ICD-10-CM

## 2022-09-26 LAB — CBC WITH DIFFERENTIAL/PLATELET
Abs Immature Granulocytes: 0.02 10*3/uL (ref 0.00–0.07)
Basophils Absolute: 0 10*3/uL (ref 0.0–0.1)
Basophils Relative: 0 %
Eosinophils Absolute: 0 10*3/uL (ref 0.0–0.5)
Eosinophils Relative: 0 %
HCT: 38.4 % — ABNORMAL LOW (ref 39.0–52.0)
Hemoglobin: 13.1 g/dL (ref 13.0–17.0)
Immature Granulocytes: 0 %
Lymphocytes Relative: 8 %
Lymphs Abs: 0.5 10*3/uL — ABNORMAL LOW (ref 0.7–4.0)
MCH: 33.7 pg (ref 26.0–34.0)
MCHC: 34.1 g/dL (ref 30.0–36.0)
MCV: 98.7 fL (ref 80.0–100.0)
Monocytes Absolute: 0.7 10*3/uL (ref 0.1–1.0)
Monocytes Relative: 11 %
Neutro Abs: 5 10*3/uL (ref 1.7–7.7)
Neutrophils Relative %: 81 %
Platelets: 151 10*3/uL (ref 150–400)
RBC: 3.89 MIL/uL — ABNORMAL LOW (ref 4.22–5.81)
RDW: 16.1 % — ABNORMAL HIGH (ref 11.5–15.5)
WBC: 6.2 10*3/uL (ref 4.0–10.5)
nRBC: 0 % (ref 0.0–0.2)

## 2022-09-26 LAB — LIPASE, BLOOD: Lipase: 26 U/L (ref 11–51)

## 2022-09-26 LAB — COMPREHENSIVE METABOLIC PANEL
ALT: 26 U/L (ref 0–44)
AST: 41 U/L (ref 15–41)
Albumin: 2.7 g/dL — ABNORMAL LOW (ref 3.5–5.0)
Alkaline Phosphatase: 122 U/L (ref 38–126)
Anion gap: 12 (ref 5–15)
BUN: 25 mg/dL — ABNORMAL HIGH (ref 8–23)
CO2: 24 mmol/L (ref 22–32)
Calcium: 8.4 mg/dL — ABNORMAL LOW (ref 8.9–10.3)
Chloride: 95 mmol/L — ABNORMAL LOW (ref 98–111)
Creatinine, Ser: 1.99 mg/dL — ABNORMAL HIGH (ref 0.61–1.24)
GFR, Estimated: 34 mL/min — ABNORMAL LOW (ref >60)
Glucose, Bld: 123 mg/dL — ABNORMAL HIGH (ref 70–99)
Potassium: 3.3 mmol/L — ABNORMAL LOW (ref 3.5–5.1)
Sodium: 131 mmol/L — ABNORMAL LOW (ref 135–145)
Total Bilirubin: 0.9 mg/dL (ref 0.3–1.2)
Total Protein: 6 g/dL — ABNORMAL LOW (ref 6.5–8.1)

## 2022-09-26 LAB — MAGNESIUM: Magnesium: 1.7 mg/dL (ref 1.7–2.4)

## 2022-09-26 LAB — LACTIC ACID, PLASMA: Lactic Acid, Venous: 2 mmol/L (ref 0.5–1.9)

## 2022-09-26 MED ORDER — SODIUM CHLORIDE 0.9 % IV BOLUS
1000.0000 mL | Freq: Once | INTRAVENOUS | Status: AC
  Administered 2022-09-26: 1000 mL via INTRAVENOUS

## 2022-09-26 MED ORDER — ONDANSETRON HCL 4 MG/2ML IJ SOLN
4.0000 mg | Freq: Once | INTRAMUSCULAR | Status: AC
  Administered 2022-09-26: 4 mg via INTRAVENOUS
  Filled 2022-09-26: qty 2

## 2022-09-26 NOTE — ED Notes (Signed)
Discharge instructions reviewed with patient. Patient questions answered and opportunity for education reviewed. Patient voices understanding of discharge instructions with no further questions. Patient to lobby via wheelchair. 

## 2022-09-26 NOTE — ED Provider Notes (Signed)
[place] EMERGENCY DEPARTMENT AT [facility] Provider Note   CSN: [id number] Arrival date & time: 09/26/22  2041     History  Chief Complaint  Patient presents with   Weakness   Emesis    LAURO TENORIO is a 82 y.o. male.  Is here with generalized weakness and diarrhea.  Had similar symptoms a few days ago that self resolved and then started again today.  He has a history of kidney cancer and now dealing with liver cancer.  Overall diarrhea and dehydration issues have been somewhat chronic here the last few months.  He is on some immunologic medication daily but states may be chemotherapy few weeks ago.  He denies any fevers or chills.  No abdominal pain.  He had about 8 episodes of diarrhea today.  Some episodes of emesis.  Symptoms seem to have resolved most part but has not been on any recent antibiotics.  He has been getting IV fluids intermittently with oncology with improvement.  Is on Marinol for nausea and vomiting.  The history is provided by the patient.       Home Medications Prior to Admission medications   Medication Sig Start Date End Date Taking? Authorizing Provider  acetaminophen (TYLENOL) 500 MG tablet Take 1,000 mg by mouth every 6 (six) hours as needed for moderate pain.    [provider]  aluminum hydroxide-magnesium carbonate (GAVISCON) 95-358 MG/15ML SUSP Take 15 mLs by mouth as needed for heartburn or indigestion. Patient not taking: Reported on 09/09/2022    [provider]  amLODipine (NORVASC) 5 MG tablet Take 5 mg by mouth daily. 04/23/22   [provider]  cabozantinib (CABOMETYX) 20 MG tablet Take 1 tablet (20 mg total) by mouth daily. Take on an empty stomach, 1 hour before or 2 hours after meals. 09/09/22   Pollyann Samples, NP  Cholecalciferol (VITAMIN D3) 10 MCG (400 UNIT) tablet Take 800 Units by mouth daily.    [provider]  diclofenac Sodium (VOLTAREN) 1 % GEL Apply 1 application. topically 4 (four)  times daily as needed (pain).    [provider]  dronabinol (MARINOL) 2.5 MG capsule Take 1 capsule (2.5 mg total) by mouth 2 (two) times daily before a meal. 09/09/22   Pollyann Samples, NP  ferrous sulfate 325 (65 FE) MG tablet Take 325 mg by mouth 4 (four) times a week.    [provider]  ibuprofen (ADVIL) 200 MG tablet Take 200 mg by mouth daily. Patient not taking: Reported on 09/09/2022    [provider]  magnesium chloride (SLOW-MAG) 64 MG TBEC SR tablet Take 1 tablet (64 mg total) by mouth 2 (two) times daily. 09/22/22   Felicie Morn, NP  Multiple Vitamin (MULTIVITAMIN WITH MINERALS) TABS tablet Take 1 tablet by mouth 4 (four) times a week.    [provider]  Omega-3 350 MG CPDR Take 350 mg by mouth daily.    [provider]  ondansetron (ZOFRAN) 8 MG tablet Take 1 tablet (8 mg total) by mouth every 8 (eight) hours as needed for nausea or vomiting. 09/09/22   Pollyann Samples, NP  potassium chloride SA (KLOR-CON M) 20 MEQ tablet Take 1 tablet (20 mEq total) by mouth daily. 09/22/22   Felicie Morn, NP  prochlorperazine (COMPAZINE) 10 MG tablet TAKE 1 TABLET(10 MG) BY MOUTH EVERY 6 HOURS AS NEEDED FOR NAUSEA OR VOMITING 09/08/22   Si Gaul, MD  rosuvastatin (CRESTOR) 20 MG tablet Take  0.5 tablets (10 mg total) by mouth daily. 04/02/22   [name], MD  telmisartan (MICARDIS) 80 MG tablet Take 0.5 tablets (40 mg total) by mouth 2 (two) times daily. 10/13/21   [name], MD  TURMERIC PO Take 1 tablet by mouth 3 (three) times a week.    [provider]      Allergies    Statins    Review of Systems   Review of Systems  Physical Exam Updated Vital Signs BP 130/62   Pulse 78   Temp (!) 96.6 F (35.9 C) (Temporal)   Resp 16   Ht 5\' 10"  (1.778 m)   Wt 62.1 kg   SpO2 97%   BMI 19.66 kg/m  Physical Exam Vitals and nursing note reviewed.  Constitutional:      General: He is not in acute distress.    Appearance: He is  well-developed. He is not ill-appearing.  HENT:     Head: Normocephalic and atraumatic.     Nose: Nose normal.     Mouth/Throat:     Mouth: Mucous membranes are moist.  Eyes:     Extraocular Movements: Extraocular movements intact.     Conjunctiva/sclera: Conjunctivae normal.     Pupils: Pupils are equal, round, and reactive to light.  Cardiovascular:     Rate and Rhythm: Normal rate and regular rhythm.     Pulses: Normal pulses.     Heart sounds: Normal heart sounds. No murmur heard. Pulmonary:     Effort: Pulmonary effort is normal. No respiratory distress.     Breath sounds: Normal breath sounds.  Abdominal:     Palpations: Abdomen is soft.     Tenderness: There is no abdominal tenderness.  Musculoskeletal:        General: No swelling.     Cervical back: Normal range of motion and neck supple.  Skin:    General: Skin is warm and dry.     Capillary Refill: Capillary refill takes less than 2 seconds.  Neurological:     General: No focal deficit present.     Mental Status: He is alert and oriented to person, place, and time.     Cranial Nerves: No cranial nerve deficit.     Sensory: No sensory deficit.     Motor: No weakness.     Coordination: Coordination normal.  Psychiatric:        Mood and Affect: Mood normal.     ED Results / Procedures / Treatments   Labs (all labs ordered are listed, but only abnormal results are displayed) Labs Reviewed  CBC WITH DIFFERENTIAL/PLATELET - Abnormal; Notable for the following components:      Result Value   RBC 3.89 (*)    HCT 38.4 (*)    RDW 16.1 (*)    Lymphs Abs 0.5 (*)    All other components within normal limits  COMPREHENSIVE METABOLIC PANEL - Abnormal; Notable for the following components:   Sodium 131 (*)    Potassium 3.3 (*)    Chloride 95 (*)    Glucose, Bld 123 (*)    BUN 25 (*)    Creatinine, Ser 1.99 (*)    Calcium 8.4 (*)    Total Protein 6.0 (*)    Albumin 2.7 (*)    GFR, Estimated 34 (*)    All other  components within normal limits  LACTIC ACID, PLASMA - Abnormal; Notable for the following components:   Lactic Acid, Venous 2.0 (*)  All other components within normal limits  LIPASE, BLOOD  MAGNESIUM    EKG None  Radiology DG Chest Portable 1 View  Result Date: 09/26/2022 CLINICAL DATA:  Pain. Weakness and vomiting. EXAM: PORTABLE CHEST 1 VIEW COMPARISON:  09/02/2021 FINDINGS: Low lung volumes. Minor subsegmental atelectasis at the right lung base. Normal heart size. The mediastinal contours are normal. There is no confluent airspace disease. No pleural fluid or pneumothorax. Right shoulder arthropathy. IMPRESSION: Low lung volumes with minor subsegmental atelectasis at the right lung base. Electronically Signed   By: Narda Rutherford M.D.   On: 09/26/2022 21:10    Procedures Procedures    Medications Ordered in ED Medications  sodium chloride 0.9 % bolus 1,000 mL (0 mLs Intravenous Stopped 09/26/22 2219)  ondansetron (ZOFRAN) injection 4 mg (4 mg Intravenous Given 09/26/22 2058)  sodium chloride 0.9 % bolus 1,000 mL (1,000 mLs Intravenous New Bag/Given 09/26/22 2141)    ED Course/ Medical Decision Making/ A&P                             Medical Decision Making Amount and/or Complexity of Data Reviewed Labs: ordered. Radiology: ordered.  Risk Prescription drug management.   Dior C Staller is here with generalized weakness and diarrhea.  Blood pressure in the low 90s upon arrival but otherwise normal vitals.  He has no abdominal tenderness.  Generally is well-appearing.  No active nausea or vomiting.  He has had several episodes of diarrhea today.  He has been dealing with diarrhea on and off for last few weeks.  Got IV fluids a few days ago in the ED with improvement.  He has not had any diarrhea in the last 4 days until today.  Has had some emesis episodes.  He takes Marinol for nausea and vomiting but has not taken any today.  Currently he feels pretty well but family  concerned maybe for dehydration.  He has not had fever.  Differential diagnosis likely chronic GI process will check CBC, CMP, lactic acid, chest x-ray.  Unlikely that he has C. difficile for significant stool pathogen but if he gives [place] a stool sample we can send this off.  Will give 2 L of IV fluids and reevaluate.  Per my review and dictation of labs lactic acid is 2, creatinine is 1.99.  Lab work is otherwise unremarkable.  Creatinine is just a little bit above baseline.  His blood pressure is greatly improved following 2 L of IV fluids.  He has no leukocytosis.  No signs of pneumonia on his chest x-ray.  He is feeling much better.  He has not had any episodes of vomiting or diarrhea here.  Recommend that they continue taking Zofran and Marinol at home which family did not give today.  Little bit concerned about maybe some sedation from the Marinol but ultimately I think it is a risk-benefit process at this time.  I have no concern for infectious process.  This appears to be more of a chronic issue here for him recently.  Strongly encouraged follow-up with primary care and oncology.  Patient discharged in good condition.  We did talk about possible admission but patient did not want to stay and I think is reasonable that they can continue hydration at home.  This chart was dictated using voice recognition software.  Despite best efforts to proofread,  errors can occur which can change the documentation meaning.  Final Clinical Impression(s) / ED Diagnoses Final diagnoses:  Dehydration    Rx / DC Orders ED Discharge Orders     None         Virgina Norfolk, DO 09/26/22 2225

## 2022-09-26 NOTE — ED Notes (Signed)
MD made aware of BP

## 2022-09-26 NOTE — ED Notes (Signed)
Patient placed on the 2L [place] to maintain an oxygen saturation greater than 90%. Patient tolerating well.

## 2022-09-28 NOTE — Assessment & Plan Note (Deleted)
Hydrate and monitor 

## 2022-09-28 NOTE — Assessment & Plan Note (Deleted)
[id number] acceptable, minimize simple carbs. Increase exercise as tolerated.  

## 2022-09-28 NOTE — Assessment & Plan Note (Deleted)
Supplement and monitor 

## 2022-09-28 NOTE — Assessment & Plan Note (Deleted)
Patient encouraged to maintain heart healthy diet, regular exercise, adequate sleep. Consider daily probiotics. Take medications as prescribed. Labs ordered and reviewed. Per gastroenterology he has aged out of colonoscopy 

## 2022-09-28 NOTE — Assessment & Plan Note (Deleted)
Seen in ER due to dehydration. Significant electrolyte abnormalities. Will monitor with labs

## 2022-09-28 NOTE — Assessment & Plan Note (Deleted)
On Levothyroxine, continue to monitor 

## 2022-09-28 NOTE — Assessment & Plan Note (Deleted)
Encourage heart healthy diet such as MIND or DASH diet, increase exercise, avoid trans fats, simple carbohydrates and processed foods, consider a krill or fish or flaxseed oil cap daily.  °

## 2022-09-28 NOTE — Assessment & Plan Note (Deleted)
Well controlled, no changes to meds. Encouraged heart healthy diet such as the DASH diet and exercise as tolerated.  °

## 2022-09-29 ENCOUNTER — Telehealth: Payer: Self-pay | Admitting: Medical Oncology

## 2022-09-29 ENCOUNTER — Other Ambulatory Visit: Payer: Self-pay | Admitting: Medical Oncology

## 2022-09-29 ENCOUNTER — Encounter: Payer: Medicare Other | Admitting: Family Medicine

## 2022-09-29 ENCOUNTER — Inpatient Hospital Stay (HOSPITAL_BASED_OUTPATIENT_CLINIC_OR_DEPARTMENT_OTHER): Payer: Medicare Other | Admitting: Physician Assistant

## 2022-09-29 ENCOUNTER — Inpatient Hospital Stay: Payer: Medicare Other

## 2022-09-29 ENCOUNTER — Encounter: Payer: Self-pay | Admitting: Internal Medicine

## 2022-09-29 VITALS — BP 121/75 | HR 83 | Temp 97.0°F | Resp 16

## 2022-09-29 DIAGNOSIS — R197 Diarrhea, unspecified: Secondary | ICD-10-CM

## 2022-09-29 DIAGNOSIS — C649 Malignant neoplasm of unspecified kidney, except renal pelvis: Secondary | ICD-10-CM

## 2022-09-29 DIAGNOSIS — C641 Malignant neoplasm of right kidney, except renal pelvis: Secondary | ICD-10-CM | POA: Diagnosis not present

## 2022-09-29 DIAGNOSIS — R112 Nausea with vomiting, unspecified: Secondary | ICD-10-CM

## 2022-09-29 DIAGNOSIS — I10 Essential (primary) hypertension: Secondary | ICD-10-CM

## 2022-09-29 DIAGNOSIS — E860 Dehydration: Secondary | ICD-10-CM

## 2022-09-29 DIAGNOSIS — E039 Hypothyroidism, unspecified: Secondary | ICD-10-CM

## 2022-09-29 DIAGNOSIS — Z5112 Encounter for antineoplastic immunotherapy: Secondary | ICD-10-CM | POA: Diagnosis not present

## 2022-09-29 DIAGNOSIS — R739 Hyperglycemia, unspecified: Secondary | ICD-10-CM

## 2022-09-29 DIAGNOSIS — N189 Chronic kidney disease, unspecified: Secondary | ICD-10-CM

## 2022-09-29 DIAGNOSIS — R252 Cramp and spasm: Secondary | ICD-10-CM

## 2022-09-29 DIAGNOSIS — E782 Mixed hyperlipidemia: Secondary | ICD-10-CM

## 2022-09-29 DIAGNOSIS — Z0000 Encounter for general adult medical examination without abnormal findings: Secondary | ICD-10-CM

## 2022-09-29 DIAGNOSIS — E559 Vitamin D deficiency, unspecified: Secondary | ICD-10-CM

## 2022-09-29 LAB — CBC WITH DIFFERENTIAL (CANCER CENTER ONLY)
Abs Immature Granulocytes: 0.01 10*3/uL (ref 0.00–0.07)
Basophils Absolute: 0 10*3/uL (ref 0.0–0.1)
Basophils Relative: 1 %
Eosinophils Absolute: 0.1 10*3/uL (ref 0.0–0.5)
Eosinophils Relative: 2 %
HCT: 35 % — ABNORMAL LOW (ref 39.0–52.0)
Hemoglobin: 12.1 g/dL — ABNORMAL LOW (ref 13.0–17.0)
Immature Granulocytes: 0 %
Lymphocytes Relative: 16 %
Lymphs Abs: 0.7 10*3/uL (ref 0.7–4.0)
MCH: 34.1 pg — ABNORMAL HIGH (ref 26.0–34.0)
MCHC: 34.6 g/dL (ref 30.0–36.0)
MCV: 98.6 fL (ref 80.0–100.0)
Monocytes Absolute: 0.4 10*3/uL (ref 0.1–1.0)
Monocytes Relative: 11 %
Neutro Abs: 3 10*3/uL (ref 1.7–7.7)
Neutrophils Relative %: 70 %
Platelet Count: 150 10*3/uL (ref 150–400)
RBC: 3.55 MIL/uL — ABNORMAL LOW (ref 4.22–5.81)
RDW: 16.6 % — ABNORMAL HIGH (ref 11.5–15.5)
WBC Count: 4.2 10*3/uL (ref 4.0–10.5)
nRBC: 0 % (ref 0.0–0.2)

## 2022-09-29 LAB — CMP (CANCER CENTER ONLY)
ALT: 23 U/L (ref 0–44)
AST: 28 U/L (ref 15–41)
Albumin: 2.8 g/dL — ABNORMAL LOW (ref 3.5–5.0)
Alkaline Phosphatase: 119 U/L (ref 38–126)
Anion gap: 9 (ref 5–15)
BUN: 13 mg/dL (ref 8–23)
CO2: 26 mmol/L (ref 22–32)
Calcium: 8 mg/dL — ABNORMAL LOW (ref 8.9–10.3)
Chloride: 102 mmol/L (ref 98–111)
Creatinine: 1.11 mg/dL (ref 0.61–1.24)
GFR, Estimated: >60 mL/min (ref >60)
Glucose, Bld: 137 mg/dL — ABNORMAL HIGH (ref 70–99)
Potassium: 3.6 mmol/L (ref 3.5–5.1)
Sodium: 137 mmol/L (ref 135–145)
Total Bilirubin: 0.8 mg/dL (ref 0.3–1.2)
Total Protein: 5.4 g/dL — ABNORMAL LOW (ref 6.5–8.1)

## 2022-09-29 LAB — C DIFFICILE QUICK SCREEN W PCR REFLEX
C Diff antigen: NEGATIVE
C Diff interpretation: NOT DETECTED
C Diff toxin: NEGATIVE

## 2022-09-29 LAB — MAGNESIUM: Magnesium: 1.4 mg/dL — ABNORMAL LOW (ref 1.7–2.4)

## 2022-09-29 MED ORDER — SODIUM CHLORIDE 0.9 % IV SOLN: Freq: Once | INTRAVENOUS | Status: AC

## 2022-09-29 MED ORDER — DIPHENOXYLATE-ATROPINE 2.5-0.025 MG PO TABS
2.0000 | ORAL_TABLET | Freq: Four times a day (QID) | ORAL | 0 refills | Status: AC | PRN
Start: 2022-09-29 — End: 2022-10-06

## 2022-09-29 MED ORDER — ONDANSETRON HCL 4 MG/2ML IJ SOLN
4.0000 mg | Freq: Once | INTRAMUSCULAR | Status: AC
  Administered 2022-09-29: 4 mg via INTRAVENOUS
  Filled 2022-09-29: qty 2

## 2022-09-29 NOTE — Progress Notes (Signed)
Lab orders entered

## 2022-09-29 NOTE — Telephone Encounter (Signed)
Faxed referral and progress notes to [facility].

## 2022-09-29 NOTE — Telephone Encounter (Signed)
Diarrhea started up since Saturday and "every 2 hours"  last episode at 1100 today.  Appetite -Marinol started 05/22 x 3 days then held it. function . I had to help him to bathroom. He was having side effects - "Oblivious, glazed eyes, confused , legs won't function". Hid mental status is better since holding the marinol .  [facility]- appt today @1430  -wife confirmed. Labs ordered.

## 2022-09-29 NOTE — Progress Notes (Signed)
Symptom Management Consult Note [facility]    Patient Care Team: [name], MD as PCP - General (Family Medicine)    Name / MRN / DOBJetli Mcintyre  [id number]  04-13-1941   Date of visit: 09/29/2022   Chief Complaint/Reason for visit: diarrhea   Current Therapy: Systemic treatment with nivolumab 480 Mg IV every 4 weeks in addition to Cabometyx 40 mg p.o. daily.   Last treatment:  Day 1   Cycle 4 on 09/09/22   ASSESSMENT & PLAN: Patient is a 82 y.o. male  with oncologic history of metastatic renal cell carcinoma followed by Dr. Arbutus Ped.  I have viewed most recent oncology note and lab work.    #Metastatic renal cell carcinoma - Next appointment with oncologist is 10/07/22   #Diarrhea -Grade 3. Has been holding Cabometyx since 09/2022 -PE nontoxic appearing, benign abdomen -CMP without significant electrolyte derangement, no renal insufficiency. -Magnesium slightly low at 1.4, already has PO replacement at home although has not yet started. Plans to start tomorrow. -Stool studies collected. C diff negative, PCR still in process. -Patient received 1L NS in clinic for hydration support and zofran for nausea. BP soft on arrival, improved after IVF. -Prescription sent to pharmacy for Lomotil. -Patient will RTC in 3 days for recheck and repeat labs. Would consider starting medrol dosepak if still having significant diarrhea.   Strict ED precautions discussed should symptoms worsen.    Heme/Onc History: Oncology History  Cancer of kidney (HCC)  11/18/2021 Initial Diagnosis   Cancer of kidney (HCC)   11/18/2021 Cancer Staging   Staging form: Kidney, AJCC 8th Edition - Clinical: Stage III (cT3a, cN0, cM0) - Signed by Benjiman Core, MD on 11/18/2021 Histologic grade (G): G4 Histologic grading system: 4 grade system   12/03/2021 - 12/03/2021 Chemotherapy   Patient is on Treatment Plan : HEAD/NECK Pembrolizumab (200) q21d     12/03/2021 - 05/21/2022  Chemotherapy   Patient is on Treatment Plan : HEAD/NECK Pembrolizumab (200) q21d     06/18/2022 -  Chemotherapy   Patient is on Treatment Plan : RENAL CELL Nivolumab (480) q28d         Interval history-: Micheal Mcintyre is a 82 y.o. male with oncologic history as above presenting to [facility] today with chief complaint of diarrhea x 10 days. He is accompanied by spouse and daughter who provide additional history.  Patient has had intermittent diarrhea, ranging from 0-11 episodes per day. He stopped taking Cabometyx x8 days ago as they thought it could by causing the diarrhea. Today he is reporting 4 episodes of liquid yellow stool. He has nausea although denies associated abdominal pain or fevers. Did not take anti emetic today. He has been taking imodium without much improvement. Also has been following BRAT diet to help with diarrhea. He has has poor PO intake over the last week because he was feeling poorly and spends most of the day sleeping. He was seen in the ED twice for similar symptoms on 09/22/22 and 09/26/22 for similar symptoms. He was prescribed course of PO potassium which he has completed. He had difficult time getting PO magnesium although was finally able to today, has not yet started. No recent antibiotic use. No one in the home has similar symptoms.   ROS  All other systems are reviewed and are negative for acute change except as noted in the HPI.    Allergies  Allergen Reactions   Statins Other (See Comments)  myalgia     Past Medical History:  Diagnosis Date   Arthritis    GERD (gastroesophageal reflux disease)    Hypertension    Medicare annual wellness visit, subsequent 08/19/2014   Osteoarthritis 09/21/2007   Qualifier: Diagnosis of  By: Alphonzo Severance MD, Ivar Drape R    PONV (postoperative nausea and vomiting)    Preventative health care 06/07/2016   Right shoulder pain 12/03/2016     Past Surgical History:  Procedure Laterality Date   CHOLECYSTECTOMY     HIP  SURGERY     JOINT REPLACEMENT     NEPHRECTOMY Right 10/15/2021   Procedure: NEPHRECTOMY, OPEN;  Surgeon: Jannifer Hick, MD;  Location: [facility];  Service: Urology;  Laterality: Right;   nose skin cancer removal     TOTAL HIP ARTHROPLASTY      Social History   Socioeconomic History   Marital status: Married    Spouse name: Not on file   Number of children: Not on file   Years of education: Not on file   Highest education level: Not on file  Occupational History   Not on file  Tobacco Use   Smoking status: Never   Smokeless tobacco: Never  Vaping Use   Vaping Use: Never used  Substance and Sexual Activity   Alcohol use: Yes    Comment: occ   Drug use: No   Sexual activity: Not Currently  Other Topics Concern   Not on file  Social History Narrative   Not on file   Social Determinants of Health   Financial Resource Strain: Low Risk  (01/08/2021)   Overall Financial Resource Strain (CARDIA)    Difficulty of Paying Living Expenses: Not hard at all  Food Insecurity: No Food Insecurity (01/08/2021)   Hunger Vital Sign    Worried About Running Out of Food in the Last Year: Never true    Ran Out of Food in the Last Year: Never true  Transportation Needs: No Transportation Needs (01/08/2021)   PRAPARE - Administrator, Civil Service (Medical): No    Lack of Transportation (Non-Medical): No  Physical Activity: Sufficiently Active (01/08/2021)   Exercise Vital Sign    Days of Exercise per Week: 5 days    Minutes of Exercise per Session: 30 min  Stress: No Stress Concern Present (01/08/2021)   [employer] of Occupational Health - Occupational Stress Questionnaire    Feeling of Stress : Not at all  Social Connections: Moderately Integrated (01/08/2021)   Social Connection and Isolation Panel [NHANES]    Frequency of Communication with Friends and Family: More than three times a week    Frequency of Social Gatherings with Friends and Family: More than three times a week     Attends Religious Services: More than 4 times per year    Active Member of [employer] or Organizations: No    Attends Banker Meetings: Never    Marital Status: Married  Catering manager Violence: Not At Risk (01/08/2021)   Humiliation, Afraid, Rape, and Kick questionnaire    Fear of Current or Ex-Partner: No    Emotionally Abused: No    Physically Abused: No    Sexually Abused: No    Family History  Problem Relation Age of Onset   Heart attack Father 60       deceased   Diabetes Maternal Uncle        maternal grandfather   Hypertension Neg Hx    Breast cancer Neg Hx  Colon cancer Neg Hx    Prostate cancer Neg Hx      Current Outpatient Medications:    diphenoxylate-atropine (LOMOTIL) 2.5-0.025 MG tablet, Take 2 tablets by mouth 4 (four) times daily as needed for up to 7 days for diarrhea or loose stools. Do not take more than 8 tablets per day, Disp: 56 tablet, Rfl: 0   acetaminophen (TYLENOL) 500 MG tablet, Take 1,000 mg by mouth every 6 (six) hours as needed for moderate pain., Disp: , Rfl:    aluminum hydroxide-magnesium carbonate (GAVISCON) 95-358 MG/15ML SUSP, Take 15 mLs by mouth as needed for heartburn or indigestion. (Patient not taking: Reported on 09/09/2022), Disp: , Rfl:    amLODipine (NORVASC) 5 MG tablet, Take 5 mg by mouth daily., Disp: , Rfl:    cabozantinib (CABOMETYX) 20 MG tablet, Take 1 tablet (20 mg total) by mouth daily. Take on an empty stomach, 1 hour before or 2 hours after meals., Disp: 30 tablet, Rfl: 3   Cholecalciferol (VITAMIN D3) 10 MCG (400 UNIT) tablet, Take 800 Units by mouth daily., Disp: , Rfl:    diclofenac Sodium (VOLTAREN) 1 % GEL, Apply 1 application. topically 4 (four) times daily as needed (pain)., Disp: , Rfl:    dronabinol (MARINOL) 2.5 MG capsule, Take 1 capsule (2.5 mg total) by mouth 2 (two) times daily before a meal., Disp: 60 capsule, Rfl: 0   ferrous sulfate 325 (65 FE) MG tablet, Take 325 mg by mouth 4 (four) times a week.,  Disp: , Rfl:    ibuprofen (ADVIL) 200 MG tablet, Take 200 mg by mouth daily. (Patient not taking: Reported on 09/09/2022), Disp: , Rfl:    magnesium chloride (SLOW-MAG) 64 MG TBEC SR tablet, Take 1 tablet (64 mg total) by mouth 2 (two) times daily., Disp: 60 tablet, Rfl: 0   Multiple Vitamin (MULTIVITAMIN WITH MINERALS) TABS tablet, Take 1 tablet by mouth 4 (four) times a week., Disp: , Rfl:    Omega-3 350 MG CPDR, Take 350 mg by mouth daily., Disp: , Rfl:    ondansetron (ZOFRAN) 8 MG tablet, Take 1 tablet (8 mg total) by mouth every 8 (eight) hours as needed for nausea or vomiting., Disp: 20 tablet, Rfl: 3   potassium chloride SA (KLOR-CON M) 20 MEQ tablet, Take 1 tablet (20 mEq total) by mouth daily., Disp: 3 tablet, Rfl: 0   prochlorperazine (COMPAZINE) 10 MG tablet, TAKE 1 TABLET(10 MG) BY MOUTH EVERY 6 HOURS AS NEEDED FOR NAUSEA OR VOMITING, Disp: 30 tablet, Rfl: 0   rosuvastatin (CRESTOR) 20 MG tablet, Take 0.5 tablets (10 mg total) by mouth daily., Disp: 90 tablet, Rfl: 3   telmisartan (MICARDIS) 80 MG tablet, Take 0.5 tablets (40 mg total) by mouth 2 (two) times daily., Disp: 180 tablet, Rfl: 1   TURMERIC PO, Take 1 tablet by mouth 3 (three) times a week., Disp: , Rfl:   PHYSICAL EXAM: ECOG FS:3 - Symptomatic, >50% confined to bed    Vitals:   09/29/22 1441 09/29/22 1645  BP: 99/68 121/75  Pulse: 81 83  Resp: 16 16  Temp: (!) 97 F (36.1 C)   TempSrc: Oral   SpO2: 96% 98%   Physical Exam Vitals and nursing note reviewed.  Constitutional:      Appearance: He is not ill-appearing or toxic-appearing.     Comments: Thin appearing male  HENT:     Head: Normocephalic.     Mouth/Throat:     Mouth: Mucous membranes are dry.  Eyes:  Conjunctiva/sclera: Conjunctivae normal.  Cardiovascular:     Rate and Rhythm: Normal rate and regular rhythm.     Pulses: Normal pulses.     Heart sounds: Normal heart sounds.  Pulmonary:     Effort: Pulmonary effort is normal.     Breath  sounds: Normal breath sounds.  Abdominal:     General: Bowel sounds are normal. There is no distension.     Palpations: Abdomen is soft.     Tenderness: There is no abdominal tenderness. There is no guarding or rebound.  Musculoskeletal:     Cervical back: Normal range of motion.  Skin:    General: Skin is warm and dry.  Neurological:     Mental Status: He is alert.        LABORATORY DATA: I have reviewed the data as listed    Latest Ref Rng & Units 09/29/2022    2:26 PM 09/26/2022    8:50 PM 09/22/2022    1:22 PM  CBC  WBC 4.0 - 10.5 K/uL 4.2  6.2  4.8   Hemoglobin 13.0 - 17.0 g/dL 78.4  69.6  29.5   Hematocrit 39.0 - 52.0 % 35.0  38.4  36.5   Platelets 150 - 400 K/uL 150  151  101         Latest Ref Rng & Units 09/29/2022    2:26 PM 09/26/2022    8:50 PM 09/22/2022    1:22 PM  CMP  Glucose 70 - 99 mg/dL 284  132  440   BUN 8 - 23 mg/dL 13  25  16    Creatinine 0.61 - 1.24 mg/dL 1.02  7.25  3.66   Sodium 135 - 145 mmol/L 137  131  132   Potassium 3.5 - 5.1 mmol/L 3.6  3.3  2.8   Chloride 98 - 111 mmol/L 102  95  97   CO2 22 - 32 mmol/L 26  24  24    Calcium 8.9 - 10.3 mg/dL 8.0  8.4  8.1   Total Protein 6.5 - 8.1 g/dL 5.4  6.0  5.7   Total Bilirubin 0.3 - 1.2 mg/dL 0.8  0.9  1.1   Alkaline Phos 38 - 126 U/L 119  122  105   AST 15 - 41 U/L 28  41  25   ALT 0 - 44 U/L 23  26  31         RADIOGRAPHIC STUDIES (from last 24 hours if applicable) I have personally reviewed the radiological images as listed and agreed with the findings in the report. No results found.      Visit Diagnosis: 1. Diarrhea, unspecified type   2. Malignant neoplasm of right kidney (HCC)      Orders Placed This Encounter  Procedures   C difficile quick screen w PCR reflex    Standing Status:   Future    Number of Occurrences:   1    Standing Expiration Date:   09/29/2023   Gastrointestinal Panel by PCR , Stool    All questions were answered. The patient knows to call the clinic  with any problems, questions or concerns. No barriers to learning was detected.  A total of more than 30 minutes were spent on this encounter with face-to-face time and non-face-to-face time, including preparing to see the patient, ordering tests and/or medications, counseling the patient and coordination of care as outlined above.    Thank you for allowing me to participate in the care of this patient.  Shanon Ace, PA-C Department of Hematology/Oncology [facility] at [facility] Phone: [phone]  [phone]    09/29/2022 9:52 PM

## 2022-09-29 NOTE — Patient Instructions (Signed)

## 2022-09-29 NOTE — Progress Notes (Shared)
Subjective:   By signing my name below, I, Vickey Sages, attest that this documentation has been prepared under the direction and in the presence of [name], MD., 09/29/2022.   Patient ID: Micheal Mcintyre, male    DOB: Oct 13, 1940, 82 y.o.   MRN: [id number]  No chief complaint on file.  HPI Patient is in today for a comprehensive physical exam and follow up on chronic medical concerns. He denies CP/palpitations/SOB/HA/fever/chills/GI or GU symptoms.  Past Medical History:  Diagnosis Date   Arthritis    GERD (gastroesophageal reflux disease)    Hypertension    Medicare annual wellness visit, subsequent 08/19/2014   Osteoarthritis 09/21/2007   Qualifier: Diagnosis of  By: Alphonzo Severance MD, Ivar Drape R    PONV (postoperative nausea and vomiting)    Preventative health care 06/07/2016   Right shoulder pain 12/03/2016    Past Surgical History:  Procedure Laterality Date   CHOLECYSTECTOMY     HIP SURGERY     JOINT REPLACEMENT     NEPHRECTOMY Right 10/15/2021   Procedure: NEPHRECTOMY, OPEN;  Surgeon: Jannifer Hick, MD;  Location: [facility];  Service: Urology;  Laterality: Right;   nose skin cancer removal     TOTAL HIP ARTHROPLASTY      Family History  Problem Relation Age of Onset   Heart attack Father 53       deceased   Diabetes Maternal Uncle        maternal grandfather   Hypertension Neg Hx    Breast cancer Neg Hx    Colon cancer Neg Hx    Prostate cancer Neg Hx     Social History   Socioeconomic History   Marital status: Married    Spouse name: Not on file   Number of children: Not on file   Years of education: Not on file   Highest education level: Not on file  Occupational History   Not on file  Tobacco Use   Smoking status: Never   Smokeless tobacco: Never  Vaping Use   Vaping Use: Never used  Substance and Sexual Activity   Alcohol use: Yes    Comment: occ   Drug use: No   Sexual activity: Not Currently  Other Topics Concern   Not on file   Social History Narrative   Not on file   Social Determinants of Health   Financial Resource Strain: Low Risk  (01/08/2021)   Overall Financial Resource Strain (CARDIA)    Difficulty of Paying Living Expenses: Not hard at all  Food Insecurity: No Food Insecurity (01/08/2021)   Hunger Vital Sign    Worried About Running Out of Food in the Last Year: Never true    Ran Out of Food in the Last Year: Never true  Transportation Needs: No Transportation Needs (01/08/2021)   PRAPARE - Administrator, Civil Service (Medical): No    Lack of Transportation (Non-Medical): No  Physical Activity: Sufficiently Active (01/08/2021)   Exercise Vital Sign    Days of Exercise per Week: 5 days    Minutes of Exercise per Session: 30 min  Stress: No Stress Concern Present (01/08/2021)   [employer] of Occupational Health - Occupational Stress Questionnaire    Feeling of Stress : Not at all  Social Connections: Moderately Integrated (01/08/2021)   Social Connection and Isolation Panel [NHANES]    Frequency of Communication with Friends and Family: More than three times a week    Frequency of Social Gatherings  with Friends and Family: More than three times a week    Attends Religious Services: More than 4 times per year    Active Member of Clubs or Organizations: No    Attends Banker Meetings: Never    Marital Status: Married  Catering manager Violence: Not At Risk (01/08/2021)   Humiliation, Afraid, Rape, and Kick questionnaire    Fear of Current or Ex-Partner: No    Emotionally Abused: No    Physically Abused: No    Sexually Abused: No    Outpatient Medications Prior to Visit  Medication Sig Dispense Refill   acetaminophen (TYLENOL) 500 MG tablet Take 1,000 mg by mouth every 6 (six) hours as needed for moderate pain.     aluminum hydroxide-magnesium carbonate (GAVISCON) 95-358 MG/15ML SUSP Take 15 mLs by mouth as needed for heartburn or indigestion. (Patient not taking: Reported  on 09/09/2022)     amLODipine (NORVASC) 5 MG tablet Take 5 mg by mouth daily.     cabozantinib (CABOMETYX) 20 MG tablet Take 1 tablet (20 mg total) by mouth daily. Take on an empty stomach, 1 hour before or 2 hours after meals. 30 tablet 3   Cholecalciferol (VITAMIN D3) 10 MCG (400 UNIT) tablet Take 800 Units by mouth daily.     diclofenac Sodium (VOLTAREN) 1 % GEL Apply 1 application. topically 4 (four) times daily as needed (pain).     dronabinol (MARINOL) 2.5 MG capsule Take 1 capsule (2.5 mg total) by mouth 2 (two) times daily before a meal. 60 capsule 0   ferrous sulfate 325 (65 FE) MG tablet Take 325 mg by mouth 4 (four) times a week.     ibuprofen (ADVIL) 200 MG tablet Take 200 mg by mouth daily. (Patient not taking: Reported on 09/09/2022)     magnesium chloride (SLOW-MAG) 64 MG TBEC SR tablet Take 1 tablet (64 mg total) by mouth 2 (two) times daily. 60 tablet 0   Multiple Vitamin (MULTIVITAMIN WITH MINERALS) TABS tablet Take 1 tablet by mouth 4 (four) times a week.     Omega-3 350 MG CPDR Take 350 mg by mouth daily.     ondansetron (ZOFRAN) 8 MG tablet Take 1 tablet (8 mg total) by mouth every 8 (eight) hours as needed for nausea or vomiting. 20 tablet 3   potassium chloride SA (KLOR-CON M) 20 MEQ tablet Take 1 tablet (20 mEq total) by mouth daily. 3 tablet 0   prochlorperazine (COMPAZINE) 10 MG tablet TAKE 1 TABLET(10 MG) BY MOUTH EVERY 6 HOURS AS NEEDED FOR NAUSEA OR VOMITING 30 tablet 0   rosuvastatin (CRESTOR) 20 MG tablet Take 0.5 tablets (10 mg total) by mouth daily. 90 tablet 3   telmisartan (MICARDIS) 80 MG tablet Take 0.5 tablets (40 mg total) by mouth 2 (two) times daily. 180 tablet 1   TURMERIC PO Take 1 tablet by mouth 3 (three) times a week.     No facility-administered medications prior to visit.    Allergies  Allergen Reactions   Statins Other (See Comments)    myalgia    Review of Systems  Constitutional:  Negative for chills and fever.  Respiratory:  Negative for  shortness of breath.   Cardiovascular:  Negative for chest pain and palpitations.  Gastrointestinal:  Negative for abdominal pain, blood in stool, constipation, diarrhea, nausea and vomiting.  Genitourinary:  Negative for dysuria, frequency, hematuria and urgency.  Skin:           Neurological:  Negative for headaches.  Objective:    Physical Exam Constitutional:      General: He is not in acute distress.    Appearance: Normal appearance. He is not ill-appearing.  HENT:     Head: Normocephalic and atraumatic.     Right Ear: Tympanic membrane, ear canal and external ear normal.     Left Ear: Tympanic membrane, ear canal and external ear normal.     Nose: Nose normal.     Mouth/Throat:     Mouth: Mucous membranes are moist.     Pharynx: Oropharynx is clear.  Eyes:     General:        Right eye: No discharge.        Left eye: No discharge.     Extraocular Movements: Extraocular movements intact.     Right eye: No nystagmus.     Left eye: No nystagmus.     Pupils: Pupils are equal, round, and reactive to light.  Neck:     Vascular: No carotid bruit.  Cardiovascular:     Rate and Rhythm: Normal rate and regular rhythm.     Pulses: Normal pulses.     Heart sounds: Normal heart sounds. No murmur heard.    No gallop.  Pulmonary:     Effort: Pulmonary effort is normal. No respiratory distress.     Breath sounds: Normal breath sounds. No wheezing or rales.  Abdominal:     General: Bowel sounds are normal.     Palpations: Abdomen is soft.     Tenderness: There is no abdominal tenderness. There is no guarding.  Musculoskeletal:        General: Normal range of motion.     Cervical back: Normal range of motion.     Right lower leg: No edema.     Left lower leg: No edema.     Comments: Muscle strength 5/5 on upper and lower extremities.   Lymphadenopathy:     Cervical: No cervical adenopathy.  Skin:    General: Skin is warm and dry.  Neurological:     Mental Status: He  is alert and oriented to person, place, and time.     Sensory: Sensation is intact.     Motor: Motor function is intact.     Coordination: Coordination is intact.     Deep Tendon Reflexes:     Reflex Scores:      Patellar reflexes are 2+ on the right side and 2+ on the left side. Psychiatric:        Mood and Affect: Mood normal.        Behavior: Behavior normal.        Judgment: Judgment normal.     There were no vitals taken for this visit. Wt Readings from Last 3 Encounters:  09/26/22 137 lb (62.1 kg)  09/09/22 137 lb (62.1 kg)  08/13/22 150 lb 4.8 oz (68.2 kg)    Diabetic Foot Exam - Simple   No data filed    Lab Results  Component Value Date   WBC 6.2 09/26/2022   HGB 13.1 09/26/2022   HCT 38.4 (L) 09/26/2022   PLT 151 09/26/2022   GLUCOSE 123 (H) 09/26/2022   CHOL 151 08/05/2022   TRIG 123.0 08/05/2022   HDL 49.50 08/05/2022   LDLCALC 77 08/05/2022   ALT 26 09/26/2022   AST 41 09/26/2022   NA 131 (L) 09/26/2022   K 3.3 (L) 09/26/2022   CL 95 (L) 09/26/2022   CREATININE 1.99 (H) 09/26/2022  BUN 25 (H) 09/26/2022   CO2 24 09/26/2022   TSH 0.012 (L) 08/13/2022   PSA 0.84 03/20/2021   HGBA1C 6.4 03/31/2022    Lab Results  Component Value Date   TSH 0.012 (L) 08/13/2022   Lab Results  Component Value Date   WBC 6.2 09/26/2022   HGB 13.1 09/26/2022   HCT 38.4 (L) 09/26/2022   MCV 98.7 09/26/2022   PLT 151 09/26/2022   Lab Results  Component Value Date   NA 131 (L) 09/26/2022   K 3.3 (L) 09/26/2022   CO2 24 09/26/2022   GLUCOSE 123 (H) 09/26/2022   BUN 25 (H) 09/26/2022   CREATININE 1.99 (H) 09/26/2022   BILITOT 0.9 09/26/2022   ALKPHOS 122 09/26/2022   AST 41 09/26/2022   ALT 26 09/26/2022   PROT 6.0 (L) 09/26/2022   ALBUMIN 2.7 (L) 09/26/2022   CALCIUM 8.4 (L) 09/26/2022   ANIONGAP 12 09/26/2022   EGFR 46 04/22/2022   GFR 50.89 (L) 08/05/2022   Lab Results  Component Value Date   CHOL 151 08/05/2022   Lab Results  Component Value  Date   HDL 49.50 08/05/2022   Lab Results  Component Value Date   LDLCALC 77 08/05/2022   Lab Results  Component Value Date   TRIG 123.0 08/05/2022   Lab Results  Component Value Date   CHOLHDL 3 08/05/2022   Lab Results  Component Value Date   HGBA1C 6.4 03/31/2022      Assessment & Plan:  Colonoscopy: Last completed on 04/15/2009. Impression: - Mild diverticulosis in the sigmoid colon. - Otherwise normal examination, excellent prep. Repeat colonoscopy is not recommended due to patient's age.  PSA: Last checked on 03/20/2021 at 0.84 ng/mL. Repeat PSA recommended in 2-3 years.  Healthy Lifestyle: Encouraged 6-8 hours of sleep, heart healthy diet, 60-80 oz of non-alcohol/non-caffeinated fluids, and 4000-8000 steps daily.  Immunizations: Encouraged patient to consider annual COVID-19 and Influenza vaccinations. Problem List Items Addressed This Visit       Cardiovascular and Mediastinum   Essential hypertension     Endocrine   Hypothyroidism     Genitourinary   CRI (chronic renal insufficiency) - Primary     Other   Hyperlipidemia, mixed   Hyperglycemia   Vitamin D deficiency   Preventative health care   Muscle cramps   Dehydration   No orders of the defined types were placed in this encounter.  I, Vickey Sages, personally preformed the services described in this documentation.  All medical record entries made by the scribe were at my direction and in my presence.  I have reviewed the chart and discharge instructions (if applicable) and agree that the record reflects my personal performance and is accurate and complete. 09/29/2022  I,Mohammed Iqbal,acting as a scribe for Danise Edge, MD.,have documented all relevant documentation on the behalf of Danise Edge, MD,as directed by  Danise Edge, MD while in the presence of Danise Edge, MD.  Vickey Sages

## 2022-09-29 NOTE — Progress Notes (Signed)
Pt referred to Home Health . Records faxed.

## 2022-09-30 ENCOUNTER — Telehealth: Payer: Self-pay | Admitting: Internal Medicine

## 2022-09-30 LAB — GASTROINTESTINAL PANEL BY PCR, STOOL (REPLACES STOOL CULTURE)

## 2022-09-30 NOTE — Telephone Encounter (Signed)
Scheduled per 05/28 scheduled message, patient has been called and voicemail was left. 

## 2022-10-01 ENCOUNTER — Other Ambulatory Visit: Payer: Self-pay

## 2022-10-02 ENCOUNTER — Other Ambulatory Visit: Payer: Self-pay | Admitting: Physician Assistant

## 2022-10-02 ENCOUNTER — Inpatient Hospital Stay: Payer: Medicare Other

## 2022-10-02 ENCOUNTER — Inpatient Hospital Stay (HOSPITAL_BASED_OUTPATIENT_CLINIC_OR_DEPARTMENT_OTHER): Payer: Medicare Other | Admitting: Physician Assistant

## 2022-10-02 VITALS — BP 118/70 | HR 70 | Temp 98.0°F | Resp 16

## 2022-10-02 DIAGNOSIS — R197 Diarrhea, unspecified: Secondary | ICD-10-CM | POA: Diagnosis not present

## 2022-10-02 DIAGNOSIS — R112 Nausea with vomiting, unspecified: Secondary | ICD-10-CM

## 2022-10-02 DIAGNOSIS — R748 Abnormal levels of other serum enzymes: Secondary | ICD-10-CM | POA: Diagnosis not present

## 2022-10-02 DIAGNOSIS — E860 Dehydration: Secondary | ICD-10-CM

## 2022-10-02 DIAGNOSIS — Z5112 Encounter for antineoplastic immunotherapy: Secondary | ICD-10-CM | POA: Diagnosis not present

## 2022-10-02 DIAGNOSIS — C641 Malignant neoplasm of right kidney, except renal pelvis: Secondary | ICD-10-CM

## 2022-10-02 LAB — CMP (CANCER CENTER ONLY)
ALT: 71 U/L — ABNORMAL HIGH (ref 0–44)
AST: 114 U/L — ABNORMAL HIGH (ref 15–41)
Albumin: 2.6 g/dL — ABNORMAL LOW (ref 3.5–5.0)
Alkaline Phosphatase: 153 U/L — ABNORMAL HIGH (ref 38–126)
Anion gap: 7 (ref 5–15)
BUN: 11 mg/dL (ref 8–23)
CO2: 27 mmol/L (ref 22–32)
Calcium: 8 mg/dL — ABNORMAL LOW (ref 8.9–10.3)
Chloride: 103 mmol/L (ref 98–111)
Creatinine: 1.06 mg/dL (ref 0.61–1.24)
GFR, Estimated: >60 mL/min (ref >60)
Glucose, Bld: 136 mg/dL — ABNORMAL HIGH (ref 70–99)
Potassium: 4 mmol/L (ref 3.5–5.1)
Sodium: 137 mmol/L (ref 135–145)
Total Bilirubin: 0.8 mg/dL (ref 0.3–1.2)
Total Protein: 5.3 g/dL — ABNORMAL LOW (ref 6.5–8.1)

## 2022-10-02 LAB — CBC WITH DIFFERENTIAL (CANCER CENTER ONLY)
Abs Immature Granulocytes: 0.03 10*3/uL (ref 0.00–0.07)
Basophils Absolute: 0 10*3/uL (ref 0.0–0.1)
Basophils Relative: 0 %
Eosinophils Absolute: 0.1 10*3/uL (ref 0.0–0.5)
Eosinophils Relative: 2 %
HCT: 35.1 % — ABNORMAL LOW (ref 39.0–52.0)
Hemoglobin: 12.1 g/dL — ABNORMAL LOW (ref 13.0–17.0)
Immature Granulocytes: 1 %
Lymphocytes Relative: 8 %
Lymphs Abs: 0.5 10*3/uL — ABNORMAL LOW (ref 0.7–4.0)
MCH: 34.3 pg — ABNORMAL HIGH (ref 26.0–34.0)
MCHC: 34.5 g/dL (ref 30.0–36.0)
MCV: 99.4 fL (ref 80.0–100.0)
Monocytes Absolute: 0.3 10*3/uL (ref 0.1–1.0)
Monocytes Relative: 5 %
Neutro Abs: 5.4 10*3/uL (ref 1.7–7.7)
Neutrophils Relative %: 84 %
Platelet Count: 163 10*3/uL (ref 150–400)
RBC: 3.53 MIL/uL — ABNORMAL LOW (ref 4.22–5.81)
RDW: 17 % — ABNORMAL HIGH (ref 11.5–15.5)
WBC Count: 6.4 10*3/uL (ref 4.0–10.5)
nRBC: 0 % (ref 0.0–0.2)

## 2022-10-02 LAB — MAGNESIUM: Magnesium: 1.5 mg/dL — ABNORMAL LOW (ref 1.7–2.4)

## 2022-10-02 MED ORDER — SODIUM CHLORIDE 0.9 % IV SOLN: Freq: Once | INTRAVENOUS | Status: AC

## 2022-10-02 MED ORDER — ONDANSETRON HCL 4 MG/2ML IJ SOLN
4.0000 mg | Freq: Once | INTRAMUSCULAR | Status: AC
  Administered 2022-10-02: 4 mg via INTRAVENOUS
  Filled 2022-10-02: qty 2

## 2022-10-02 NOTE — Progress Notes (Signed)
Symptom Management Consult Note [facility]    Patient Care Team: [name], MD as PCP - General (Family Medicine)    Name / MRN / DOBGregor Reyner  [id number]  1940/10/28   Date of visit: 10/02/2022   Chief Complaint/Reason for visit: diarrhea   Current Therapy: Systemic treatment with nivolumab 480 Mg IV every 4 weeks in addition to Cabometyx 40 mg p.o. daily.   Last treatment:  Day 1   Cycle 4 on 09/09/22   ASSESSMENT & PLAN: Patient is a 82 y.o. male  with oncologic history of metastatic renal cell carcinoma followed by Dr. Arbutus Ped.  I have viewed most recent oncology note and lab work.    #Metastatic renal cell carcinoma  - Next appointment with oncologist is 10/07/22   #Diarrhea -Stool studies from last visit were negative. -Now Grade 1. He is non toxic appearing, does clinically appear dehydrated. -CMP today shows normal kidney function, no severe electrolyte derangement.  He does have new elevation of liver enzymes, AST 114 and ALT 71.  He has benign abdominal exam.  He does take Tylenol daily although spouse thinks no more than 4 g/day.  Advised to hold Tylenol until next visit. -Liver enzymes will be trended at onc appointment next week. Could be adverse effect of nivolumab. -Magnesium still low at 1.5.  Patient will continue to take p.o. mag twice daily.  Did not recommend increasing to TID as it could cause diarrhea. -Patient received 500 ml NS for hydration support in clinic today.  #Hypertension -Patient with history of hypertension.  Currently taking amlodipine and telmisartan. -Blood pressures have been soft likely from his decreased p.o. intake. -Encouraged family to check blood pressure before he takes his medications and to hold if systolic is less than 115. -Patient is scheduled to see a nephrologist in a few weeks and plan to discuss his blood pressure medications at that visit.  Strict ED precautions discussed should symptoms  worsen.   Heme/Onc History: Oncology History  Cancer of kidney (HCC)  11/18/2021 Initial Diagnosis   Cancer of kidney (HCC)   11/18/2021 Cancer Staging   Staging form: Kidney, AJCC 8th Edition - Clinical: Stage III (cT3a, cN0, cM0) - Signed by Benjiman Core, MD on 11/18/2021 Histologic grade (G): G4 Histologic grading system: 4 grade system   12/03/2021 - 12/03/2021 Chemotherapy   Patient is on Treatment Plan : HEAD/NECK Pembrolizumab (200) q21d     12/03/2021 - 05/21/2022 Chemotherapy   Patient is on Treatment Plan : HEAD/NECK Pembrolizumab (200) q21d     06/18/2022 -  Chemotherapy   Patient is on Treatment Plan : RENAL CELL Nivolumab (480) q28d         Interval history-: SHAUNA VARELAS is a 82 y.o. male with oncologic history as above presenting to [facility] today with chief complaint of diarrhea.  He is accompanied by his spouse and daughter who provide additional history.  Spouse reports that diarrhea has drastically improved.  He only had 2 episodes yesterday and none so far today.  He is still having liquid stool although 1 bowel movement was loosely formed.  He denies any associated abdominal pain or fever.  He does still have intermittent nausea that resolves when taking antiemetics.  Patient states he is feeling better than when he was in clinic on Tuesday.  He still spends most of his time sleeping and does not drink more than 16 ounces of fluid per day.  His appetite  is decreased and he is currently not taking the Marinol as he did not like how it made him feel.  Patient last took Lomotil this morning.     ROS  All other systems are reviewed and are negative for acute change except as noted in the HPI.    Allergies  Allergen Reactions   Statins Other (See Comments)    myalgia     Past Medical History:  Diagnosis Date   Arthritis    GERD (gastroesophageal reflux disease)    Hypertension    Medicare annual wellness visit, subsequent 08/19/2014   Osteoarthritis 09/21/2007    Qualifier: Diagnosis of  By: Alphonzo Severance MD, Ivar Drape R    PONV (postoperative nausea and vomiting)    Preventative health care 06/07/2016   Right shoulder pain 12/03/2016     Past Surgical History:  Procedure Laterality Date   CHOLECYSTECTOMY     HIP SURGERY     JOINT REPLACEMENT     NEPHRECTOMY Right 10/15/2021   Procedure: NEPHRECTOMY, OPEN;  Surgeon: Jannifer Hick, MD;  Location: [facility];  Service: Urology;  Laterality: Right;   nose skin cancer removal     TOTAL HIP ARTHROPLASTY      Social History   Socioeconomic History   Marital status: Married    Spouse name: Not on file   Number of children: Not on file   Years of education: Not on file   Highest education level: Not on file  Occupational History   Not on file  Tobacco Use   Smoking status: Never   Smokeless tobacco: Never  Vaping Use   Vaping Use: Never used  Substance and Sexual Activity   Alcohol use: Yes    Comment: occ   Drug use: No   Sexual activity: Not Currently  Other Topics Concern   Not on file  Social History Narrative   Not on file   Social Determinants of Health   Financial Resource Strain: Low Risk  (01/08/2021)   Overall Financial Resource Strain (CARDIA)    Difficulty of Paying Living Expenses: Not hard at all  Food Insecurity: No Food Insecurity (01/08/2021)   Hunger Vital Sign    Worried About Running Out of Food in the Last Year: Never true    Ran Out of Food in the Last Year: Never true  Transportation Needs: No Transportation Needs (01/08/2021)   PRAPARE - Administrator, Civil Service (Medical): No    Lack of Transportation (Non-Medical): No  Physical Activity: Sufficiently Active (01/08/2021)   Exercise Vital Sign    Days of Exercise per Week: 5 days    Minutes of Exercise per Session: 30 min  Stress: No Stress Concern Present (01/08/2021)   [employer] of Occupational Health - Occupational Stress Questionnaire    Feeling of Stress : Not at all  Social  Connections: Moderately Integrated (01/08/2021)   Social Connection and Isolation Panel [NHANES]    Frequency of Communication with Friends and Family: More than three times a week    Frequency of Social Gatherings with Friends and Family: More than three times a week    Attends Religious Services: More than 4 times per year    Active Member of [employer] or Organizations: No    Attends Banker Meetings: Never    Marital Status: Married  Catering manager Violence: Not At Risk (01/08/2021)   Humiliation, Afraid, Rape, and Kick questionnaire    Fear of Current or Ex-Partner: No  Emotionally Abused: No    Physically Abused: No    Sexually Abused: No    Family History  Problem Relation Age of Onset   Heart attack Father 38       deceased   Diabetes Maternal Uncle        maternal grandfather   Hypertension Neg Hx    Breast cancer Neg Hx    Colon cancer Neg Hx    Prostate cancer Neg Hx      Current Outpatient Medications:    acetaminophen (TYLENOL) 500 MG tablet, Take 1,000 mg by mouth every 6 (six) hours as needed for moderate pain., Disp: , Rfl:    aluminum hydroxide-magnesium carbonate (GAVISCON) 95-358 MG/15ML SUSP, Take 15 mLs by mouth as needed for heartburn or indigestion. (Patient not taking: Reported on 09/09/2022), Disp: , Rfl:    amLODipine (NORVASC) 5 MG tablet, Take 5 mg by mouth daily., Disp: , Rfl:    cabozantinib (CABOMETYX) 20 MG tablet, Take 1 tablet (20 mg total) by mouth daily. Take on an empty stomach, 1 hour before or 2 hours after meals., Disp: 30 tablet, Rfl: 3   Cholecalciferol (VITAMIN D3) 10 MCG (400 UNIT) tablet, Take 800 Units by mouth daily., Disp: , Rfl:    diclofenac Sodium (VOLTAREN) 1 % GEL, Apply 1 application. topically 4 (four) times daily as needed (pain)., Disp: , Rfl:    diphenoxylate-atropine (LOMOTIL) 2.5-0.025 MG tablet, Take 2 tablets by mouth 4 (four) times daily as needed for up to 7 days for diarrhea or loose stools. Do not take more  than 8 tablets per day, Disp: 56 tablet, Rfl: 0   dronabinol (MARINOL) 2.5 MG capsule, Take 1 capsule (2.5 mg total) by mouth 2 (two) times daily before a meal., Disp: 60 capsule, Rfl: 0   ferrous sulfate 325 (65 FE) MG tablet, Take 325 mg by mouth 4 (four) times a week., Disp: , Rfl:    ibuprofen (ADVIL) 200 MG tablet, Take 200 mg by mouth daily. (Patient not taking: Reported on 09/09/2022), Disp: , Rfl:    magnesium chloride (SLOW-MAG) 64 MG TBEC SR tablet, Take 1 tablet (64 mg total) by mouth 2 (two) times daily., Disp: 60 tablet, Rfl: 0   Multiple Vitamin (MULTIVITAMIN WITH MINERALS) TABS tablet, Take 1 tablet by mouth 4 (four) times a week., Disp: , Rfl:    Omega-3 350 MG CPDR, Take 350 mg by mouth daily., Disp: , Rfl:    ondansetron (ZOFRAN) 8 MG tablet, Take 1 tablet (8 mg total) by mouth every 8 (eight) hours as needed for nausea or vomiting., Disp: 20 tablet, Rfl: 3   potassium chloride SA (KLOR-CON M) 20 MEQ tablet, Take 1 tablet (20 mEq total) by mouth daily., Disp: 3 tablet, Rfl: 0   prochlorperazine (COMPAZINE) 10 MG tablet, TAKE 1 TABLET(10 MG) BY MOUTH EVERY 6 HOURS AS NEEDED FOR NAUSEA OR VOMITING, Disp: 30 tablet, Rfl: 0   rosuvastatin (CRESTOR) 20 MG tablet, Take 0.5 tablets (10 mg total) by mouth daily., Disp: 90 tablet, Rfl: 3   telmisartan (MICARDIS) 80 MG tablet, Take 0.5 tablets (40 mg total) by mouth 2 (two) times daily., Disp: 180 tablet, Rfl: 1   TURMERIC PO, Take 1 tablet by mouth 3 (three) times a week., Disp: , Rfl:   PHYSICAL EXAM: ECOG FS:3 - Symptomatic, >50% confined to bed    Vitals:   10/02/22 1403 10/02/22 1458  BP: 97/66 118/70  Pulse: 78 70  Resp: 16   Temp: 98 F (  36.7 C)   TempSrc: Oral   SpO2: 96%    Physical Exam Vitals and nursing note reviewed.  Constitutional:      Appearance: He is underweight. He is not ill-appearing or toxic-appearing.  HENT:     Head: Normocephalic.     Mouth/Throat:     Mouth: Mucous membranes are dry.  Eyes:      Conjunctiva/sclera: Conjunctivae normal.  Cardiovascular:     Rate and Rhythm: Normal rate and regular rhythm.     Pulses: Normal pulses.     Heart sounds: Normal heart sounds.  Pulmonary:     Effort: Pulmonary effort is normal.     Breath sounds: Normal breath sounds.  Abdominal:     General: There is no distension.     Tenderness: There is no abdominal tenderness.  Musculoskeletal:     Cervical back: Normal range of motion.  Skin:    General: Skin is warm and dry.  Neurological:     Mental Status: He is alert.        LABORATORY DATA: I have reviewed the data as listed    Latest Ref Rng & Units 10/02/2022    1:50 PM 09/29/2022    2:26 PM 09/26/2022    8:50 PM  CBC  WBC 4.0 - 10.5 K/uL 6.4  4.2  6.2   Hemoglobin 13.0 - 17.0 g/dL 16.1  09.6  04.5   Hematocrit 39.0 - 52.0 % 35.1  35.0  38.4   Platelets 150 - 400 K/uL 163  150  151         Latest Ref Rng & Units 10/02/2022    1:50 PM 09/29/2022    2:26 PM 09/26/2022    8:50 PM  CMP  Glucose 70 - 99 mg/dL 409  811  914   BUN 8 - 23 mg/dL 11  13  25    Creatinine 0.61 - 1.24 mg/dL 7.82  9.56  2.13   Sodium 135 - 145 mmol/L 137  137  131   Potassium 3.5 - 5.1 mmol/L 4.0  3.6  3.3   Chloride 98 - 111 mmol/L 103  102  95   CO2 22 - 32 mmol/L 27  26  24    Calcium 8.9 - 10.3 mg/dL 8.0  8.0  8.4   Total Protein 6.5 - 8.1 g/dL 5.3  5.4  6.0   Total Bilirubin 0.3 - 1.2 mg/dL 0.8  0.8  0.9   Alkaline Phos 38 - 126 U/L 153  119  122   AST 15 - 41 U/L 114  28  41   ALT 0 - 44 U/L 71  23  26        RADIOGRAPHIC STUDIES (from last 24 hours if applicable) I have personally reviewed the radiological images as listed and agreed with the findings in the report. No results found.      Visit Diagnosis: 1. Diarrhea, unspecified type   2. Nausea and vomiting, unspecified vomiting type   3. Elevated liver enzymes   4. Malignant neoplasm of right kidney (HCC)      No orders of the defined types were placed in this  encounter.   All questions were answered. The patient knows to call the clinic with any problems, questions or concerns. No barriers to learning was detected.  A total of more than 20 minutes were spent on this encounter with face-to-face time and non-face-to-face time, including preparing to see the patient, ordering tests, counseling the patient and coordination  of care as outlined above.    Thank you for allowing me to participate in the care of this patient.    Shanon Ace, PA-C Department of Hematology/Oncology [facility] at [facility] Phone: [phone]  [phone]    10/02/2022 3:32 PM

## 2022-10-02 NOTE — Patient Instructions (Signed)

## 2022-10-04 NOTE — Progress Notes (Unsigned)
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   PRIOR THERAPY: 1) Status post right open radical nephrectomy completed on October 15, 2021 by Dr. Jettie Pagan.  2) Keytruda 200 Mg IV every 3 weeks started December 03, 2021.  Status post 9 cycles.  Last dose was given May 21, 2022.  This was discontinued secondary to disease progression  CURRENT THERAPY:  Systemic treatment with nivolumab 480 Mg IV every 4 weeks in addition to Cabometyx 40 mg p.o. daily.  First dose June 18, 2022.  Status post 3 cycles. His Cabometyx has been on hold recently due to adverse side effects. His dose was reduced in May to 20 mg p.o. daily but he continues to have weakness, diarrhea, and dehydration. This has been on hold since 5/19.  INTERVAL HISTORY: Micheal Mcintyre 82 y.o. male returns to the clinic today for a follow up visit accompanied by his wife and daughter. He is followed for his metastatic renal cell carcinoma. He is currently undergoing treatment with nivolumab and cabometyx. He has been having a hard time with diarrhea, dehydration, weight loss and weakness. When he was last seen in the clinic, he resumed his cabometyx with reduced dose of 20 mg. He called reporting intolerance with this and this has been on hold since 5/19. He was treated and seen in the [facility] for IVF and given perception for lomotil with improvement of his diarrhea. He states his bowel movements are soft/a little loose. Overall, his stools are becoming more solid and he is not having as many frequent bowel movements. His magnesium was also low recently and he is currently taking magnesium oxide BID. Of note, his most recent CT scan reviewed at his last appointment showed  stable/slightly improved disease.   His weight is stable but he still has poor appetite secondary to taste alterations. He tried marinol but it caused too much drowsiness so this was stopped on 5/31. He is scheduled to see a member of the nutritionist team while in the infusion room today. He does not like the taste of ensure or premiere protein drink. They are concerned about the adverse side effects of megace. They would be interested in trying remeron.   Since last been seen, he denies fevers, chills, or night sweats.  He denies chest pain, shortness of breath, or hemoptysis. He may have mild cough at night but states it does not last. He had 8 episodes of nausea/vomiting/gagging in the last two weeks or so. The most recent episode was on 6/1. His LFTs were a little elevated last week. He takes 2 500 mg tylenol tablets per day. He is here for evaluation and repeat blood work before starting cycle #4 of nivolumab.     MEDICAL HISTORY: Past Medical History:  Diagnosis Date   Arthritis    GERD (gastroesophageal reflux disease)    Hypertension    Medicare annual wellness visit, subsequent 08/19/2014   Osteoarthritis 09/21/2007   Qualifier: Diagnosis of  By: Alphonzo Severance MD, Ivar Drape R    PONV (postoperative nausea and vomiting)    Preventative health care 06/07/2016   Right shoulder pain 12/03/2016    ALLERGIES:  is allergic to statins.  MEDICATIONS:  Current Outpatient Medications  Medication Sig  Dispense Refill   mirtazapine (REMERON) 15 MG tablet Take 1 tablet (15 mg total) by mouth at bedtime. 30 tablet 2   acetaminophen (TYLENOL) 500 MG tablet Take 1,000 mg by mouth every 6 (six) hours as needed for moderate pain.     aluminum hydroxide-magnesium carbonate (GAVISCON) 95-358 MG/15ML SUSP Take 15 mLs by mouth as needed for heartburn or indigestion. (Patient not taking: Reported on 09/09/2022)     amLODipine (NORVASC) 5 MG tablet Take 5 mg by mouth daily.     cabozantinib (CABOMETYX) 20 MG  tablet Take 1 tablet (20 mg total) by mouth daily. Take on an empty stomach, 1 hour before or 2 hours after meals. 30 tablet 3   Cholecalciferol (VITAMIN D3) 10 MCG (400 UNIT) tablet Take 800 Units by mouth daily.     diclofenac Sodium (VOLTAREN) 1 % GEL Apply 1 application. topically 4 (four) times daily as needed (pain).     dronabinol (MARINOL) 2.5 MG capsule Take 1 capsule (2.5 mg total) by mouth 2 (two) times daily before a meal. 60 capsule 0   ferrous sulfate 325 (65 FE) MG tablet Take 325 mg by mouth 4 (four) times a week.     ibuprofen (ADVIL) 200 MG tablet Take 200 mg by mouth daily. (Patient not taking: Reported on 09/09/2022)     magnesium chloride (SLOW-MAG) 64 MG TBEC SR tablet Take 1 tablet (64 mg total) by mouth 2 (two) times daily. 60 tablet 0   Multiple Vitamin (MULTIVITAMIN WITH MINERALS) TABS tablet Take 1 tablet by mouth 4 (four) times a week.     Omega-3 350 MG CPDR Take 350 mg by mouth daily.     ondansetron (ZOFRAN) 8 MG tablet Take 1 tablet (8 mg total) by mouth every 8 (eight) hours as needed for nausea or vomiting. 20 tablet 3   potassium chloride SA (KLOR-CON M) 20 MEQ tablet Take 1 tablet (20 mEq total) by mouth daily. 3 tablet 0   prochlorperazine (COMPAZINE) 10 MG tablet TAKE 1 TABLET(10 MG) BY MOUTH EVERY 6 HOURS AS NEEDED FOR NAUSEA OR VOMITING 30 tablet 0   rosuvastatin (CRESTOR) 20 MG tablet Take 0.5 tablets (10 mg total) by mouth daily. 90 tablet 3   telmisartan (MICARDIS) 80 MG tablet Take 0.5 tablets (40 mg total) by mouth 2 (two) times daily. 180 tablet 1   TURMERIC PO Take 1 tablet by mouth 3 (three) times a week.     No current facility-administered medications for this visit.    SURGICAL HISTORY:  Past Surgical History:  Procedure Laterality Date   CHOLECYSTECTOMY     HIP SURGERY     JOINT REPLACEMENT     NEPHRECTOMY Right 10/15/2021   Procedure: NEPHRECTOMY, OPEN;  Surgeon: Jannifer Hick, MD;  Location: [facility];  Service: Urology;  Laterality: Right;    nose skin cancer removal     TOTAL HIP ARTHROPLASTY      REVIEW OF SYSTEMS:   Review of Systems  Constitutional: Positive for fatigue and generalized weakness. Positive for poor appetite. Negative for chills, fever and unexpected weight change.  HENT: Negative for mouth sores, nosebleeds, sore throat and trouble swallowing.   Eyes: Negative for eye problems and icterus.  Respiratory: Negative for cough, hemoptysis, shortness of breath and wheezing.   Cardiovascular: Negative for chest pain and leg swelling.  Gastrointestinal: Improving diarrhea. Positive for nausea/vomiting in the interval. Negative for abdominal pain and constipation.  Genitourinary: Negative for bladder incontinence, difficulty urinating, dysuria, frequency and  hematuria.   Musculoskeletal: Negative for back pain, gait problem, neck pain and neck stiffness.  Skin: Negative for itching and rash.  Neurological: Negative for dizziness, extremity weakness, gait problem, headaches, light-headedness and seizures.  Hematological: Negative for adenopathy. Does not bruise/bleed easily.  Psychiatric/Behavioral: Negative for confusion, depression and sleep disturbance. The patient is not nervous/anxious.     PHYSICAL EXAMINATION:  Blood pressure 102/74, pulse 84, temperature 98.2 F (36.8 C), temperature source Oral, resp. rate 17, height 5\' 10"  (1.778 m), weight 137 lb 6.4 oz (62.3 kg), SpO2 100 %.  ECOG PERFORMANCE STATUS: 1  Physical Exam  Constitutional: Oriented to person, place, and time and thin appearing male, and in no distress.  HENT:  Head: Normocephalic and atraumatic.  Mouth/Throat: Oropharynx is clear and moist. No oropharyngeal exudate.  Eyes: Conjunctivae are normal. Right eye exhibits no discharge. Left eye exhibits no discharge. No scleral icterus.  Neck: Normal range of motion. Neck supple.  Cardiovascular: Normal rate, regular rhythm, normal heart sounds and intact distal pulses.   Pulmonary/Chest:  Effort normal and breath sounds normal. No respiratory distress. No wheezes. No rales.  Abdominal: Soft. Bowel sounds are normal. Exhibits no distension and no mass. There is no tenderness.  Musculoskeletal: Normal range of motion. Exhibits no edema.  Lymphadenopathy:    No cervical adenopathy.  Neurological: Alert and oriented to person, place, and time. Exhibits muscle wasting. Examined in the wheelchair.  Skin: Skin is warm and dry. Positive for pallor. No rash noted. Not diaphoretic. No erythema.  Psychiatric: Mood, memory and judgment normal.  Vitals reviewed.  LABORATORY DATA: Lab Results  Component Value Date   WBC 5.4 10/07/2022   HGB 12.4 (L) 10/07/2022   HCT 36.4 (L) 10/07/2022   MCV 99.7 10/07/2022   PLT 232 10/07/2022      Chemistry      Component Value Date/Time   NA 138 10/07/2022 1407   NA 140 04/22/2022 0000   K 3.9 10/07/2022 1407   CL 100 10/07/2022 1407   CO2 30 10/07/2022 1407   BUN 12 10/07/2022 1407   BUN 21 04/22/2022 0000   CREATININE 1.01 10/07/2022 1407   CREATININE 1.09 02/06/2020 1306   GLU 88 04/22/2022 0000      Component Value Date/Time   CALCIUM 8.2 (L) 10/07/2022 1407   ALKPHOS 146 (H) 10/07/2022 1407   AST 28 10/07/2022 1407   ALT 30 10/07/2022 1407   BILITOT 0.8 10/07/2022 1407       RADIOGRAPHIC STUDIES:  DG Chest Portable 1 View  Result Date: 09/26/2022 CLINICAL DATA:  Pain. Weakness and vomiting. EXAM: PORTABLE CHEST 1 VIEW COMPARISON:  09/02/2021 FINDINGS: Low lung volumes. Minor subsegmental atelectasis at the right lung base. Normal heart size. The mediastinal contours are normal. There is no confluent airspace disease. No pleural fluid or pneumothorax. Right shoulder arthropathy. IMPRESSION: Low lung volumes with minor subsegmental atelectasis at the right lung base. Electronically Signed   By: Narda Rutherford M.D.   On: 09/26/2022 21:10     ASSESSMENT/PLAN:  This is a very pleasant 82 year old Caucasian male diagnosed  with metastatic papillary renal cell carcinoma.  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  This was diagnosed in June 2023.  He is status post a right open radical nephrectomy.  Patient was found to have metastatic disease to the liver and bone in February 2024.   The patient had previously underwent adjuvant treatment with [facility]  20 mg IV every 3 weeks for 9 cycles.   When he saw Dr. Arbutus Ped on 06/11/2022 and reviewed his MRI with him which showed multiple new rim-enhancing lesions throughout the liver consistent metastatic disease.  There was also a rim-enhancing lesion in the inferior endplate of L1 measuring 2.1 x 1.1 cm concerning for metastatic disease.  There is no local recurrence or lymphadenopathy. He also had a chest CT by Dr. Cardell Peach which showed pulmonary metastatic disease.    Dr. Arbutus Ped recommended that the patient start treatment with nivolumab 480 mg IV every 4 weeks in addition to Cabometyx 40 grams p.o. daily.  He started this on 06/18/2022. He is status post 5 cycles of nivolumab. His last CT scan from 09/07/22 was stable.   He has been having intolerance to cabometyx. His treatment has periodically been on hold. His dose was reduced to 20 mg in May 2024, but he continued to have intolerance. Therefore, this has been on hold since 5/19.   The patient was seen with Dr. Arbutus Ped. Labs were reviewed. He will proceed with nivolumab today. Regarding the cabometyx, Dr. Arbutus Ped recommends continuing to hold Cabometyx.  We will arrange for restaging CT scan of the chest, abdomen, and pelvis prior to his next appointment.  If the scan shows progression, then we can discuss adding back in Cabometyx. Alternatively, if he has side effects after this infusion, then we will know if his adverse side effects are from nivolumab.  Although, the patient does feel better being off Cabometyx  Recommend he continue taking magnesium BID if  tolerable. His magnesium is 1.6 today.   Encouraged him to increase his intake of protein rich foods. We discussed making his own protein drinks or trying boost. He is going to discontinue the marinol due to adverse side effects of drowsiness. We discussed alternatives with remeron vs megace. Concerned with side effect profile with megace. Therefore, he will try remeron 15 mg p.o. nightly. He is scheduled to see a member of the nutritionist tea, while in the infusion room today.   He will continue to take lomotil if needed for diarrhea. His diarrhea has improved at this time.   We will see him back for a follow up visit in 4 weeks to review his CT scan results.   The patient was advised to call immediately if he has any concerning symptoms in the interval. The patient voices understanding of current disease status and treatment options and is in agreement with the current care plan. All questions were answered. The patient knows to call the clinic with any problems, questions or concerns. We can certainly see the patient much sooner if necessary         Orders Placed This Encounter  Procedures   CT CHEST ABDOMEN PELVIS WO CONTRAST    Standing Status:   Future    Standing Expiration Date:   10/07/2023    Order Specific Question:   Preferred imaging location?    Answer:   [facility]    Order Specific Question:   If indicated for the ordered procedure, I authorize the administration of oral contrast media per Radiology protocol    Answer:   Yes    Order Specific Question:   Does the patient have a contrast media/X-ray dye allergy?    Answer:   No      Lazaria Schaben L Jaelani Posa, PA-C 10/07/22  ADDENDUM: Hematology/Oncology Attending: I did face-to-face encounter with the patient today.  I reviewed his record, lab  and recommended his care plan.  This is a very pleasant 82 years old white male with metastatic renal cell carcinoma diagnosed in initially in June 2023 status post  right open radical nephrectomy followed by 9 cycles of treatment with single agent Keytruda discontinued in January 2024 secondary to disease progression.  The patient started treatment with nivolumab and Cabometyx in February 2024.  He is status post 3 cycles.  He has been tolerating his treatment with nivolumab fairly well but the patient has a lot of side effects from the treatment with Cabometyx which was reduced to 20 mg p.o. daily and he continues to have significant weakness and fatigue as well as diarrhea. He has been off treatment with Cabometyx since 09/20/2022.  The patient received IV hydration and treatment with Imodium and Lomotil several times.  He is feeling a little bit better today with less diarrhea. I recommended for him to proceed with his treatment with nivolumab or single agent for now. I will see him back for follow-up visit in 4 weeks for evaluation with repeat CT scan of the chest, abdomen and pelvis for restaging of his disease.  If he has any evidence for disease progression, will consider resuming his treatment with Cabometyx or discuss other treatment options. The patient was advised to call immediately if he has any other concerning symptoms in the interval. The total time spent in the appointment was 30 minutes. Disclaimer: This note was dictated with voice recognition software. Similar sounding words can inadvertently be transcribed and may be missed upon review. Lajuana Matte, MD

## 2022-10-06 ENCOUNTER — Other Ambulatory Visit (HOSPITAL_COMMUNITY): Payer: Self-pay

## 2022-10-07 ENCOUNTER — Inpatient Hospital Stay: Payer: Medicare Other

## 2022-10-07 ENCOUNTER — Inpatient Hospital Stay: Payer: Medicare Other | Admitting: Dietician

## 2022-10-07 ENCOUNTER — Inpatient Hospital Stay (HOSPITAL_BASED_OUTPATIENT_CLINIC_OR_DEPARTMENT_OTHER): Payer: Medicare Other | Admitting: Physician Assistant

## 2022-10-07 ENCOUNTER — Inpatient Hospital Stay: Payer: Medicare Other | Attending: Oncology

## 2022-10-07 ENCOUNTER — Other Ambulatory Visit: Payer: Self-pay

## 2022-10-07 VITALS — BP 136/76 | HR 72 | Resp 16

## 2022-10-07 VITALS — BP 102/74 | HR 84 | Temp 98.2°F | Resp 17 | Ht 70.0 in | Wt 137.4 lb

## 2022-10-07 DIAGNOSIS — C787 Secondary malignant neoplasm of liver and intrahepatic bile duct: Secondary | ICD-10-CM | POA: Insufficient documentation

## 2022-10-07 DIAGNOSIS — R531 Weakness: Secondary | ICD-10-CM | POA: Diagnosis not present

## 2022-10-07 DIAGNOSIS — R638 Other symptoms and signs concerning food and fluid intake: Secondary | ICD-10-CM

## 2022-10-07 DIAGNOSIS — R630 Anorexia: Secondary | ICD-10-CM | POA: Insufficient documentation

## 2022-10-07 DIAGNOSIS — R197 Diarrhea, unspecified: Secondary | ICD-10-CM | POA: Diagnosis not present

## 2022-10-07 DIAGNOSIS — E860 Dehydration: Secondary | ICD-10-CM | POA: Insufficient documentation

## 2022-10-07 DIAGNOSIS — C649 Malignant neoplasm of unspecified kidney, except renal pelvis: Secondary | ICD-10-CM

## 2022-10-07 DIAGNOSIS — K219 Gastro-esophageal reflux disease without esophagitis: Secondary | ICD-10-CM | POA: Diagnosis not present

## 2022-10-07 DIAGNOSIS — Z5112 Encounter for antineoplastic immunotherapy: Secondary | ICD-10-CM | POA: Insufficient documentation

## 2022-10-07 DIAGNOSIS — Z791 Long term (current) use of non-steroidal anti-inflammatories (NSAID): Secondary | ICD-10-CM | POA: Diagnosis not present

## 2022-10-07 DIAGNOSIS — C7951 Secondary malignant neoplasm of bone: Secondary | ICD-10-CM | POA: Insufficient documentation

## 2022-10-07 DIAGNOSIS — I10 Essential (primary) hypertension: Secondary | ICD-10-CM | POA: Insufficient documentation

## 2022-10-07 LAB — CBC WITH DIFFERENTIAL (CANCER CENTER ONLY)
Abs Immature Granulocytes: 0.01 10*3/uL (ref 0.00–0.07)
Basophils Absolute: 0 10*3/uL (ref 0.0–0.1)
Basophils Relative: 0 %
Eosinophils Absolute: 0.1 10*3/uL (ref 0.0–0.5)
Eosinophils Relative: 2 %
HCT: 36.4 % — ABNORMAL LOW (ref 39.0–52.0)
Hemoglobin: 12.4 g/dL — ABNORMAL LOW (ref 13.0–17.0)
Immature Granulocytes: 0 %
Lymphocytes Relative: 9 %
Lymphs Abs: 0.5 10*3/uL — ABNORMAL LOW (ref 0.7–4.0)
MCH: 34 pg (ref 26.0–34.0)
MCHC: 34.1 g/dL (ref 30.0–36.0)
MCV: 99.7 fL (ref 80.0–100.0)
Monocytes Absolute: 0.4 10*3/uL (ref 0.1–1.0)
Monocytes Relative: 7 %
Neutro Abs: 4.5 10*3/uL (ref 1.7–7.7)
Neutrophils Relative %: 82 %
Platelet Count: 232 10*3/uL (ref 150–400)
RBC: 3.65 MIL/uL — ABNORMAL LOW (ref 4.22–5.81)
RDW: 16.6 % — ABNORMAL HIGH (ref 11.5–15.5)
WBC Count: 5.4 10*3/uL (ref 4.0–10.5)
nRBC: 0 % (ref 0.0–0.2)

## 2022-10-07 LAB — CMP (CANCER CENTER ONLY)
ALT: 30 U/L (ref 0–44)
AST: 28 U/L (ref 15–41)
Albumin: 2.8 g/dL — ABNORMAL LOW (ref 3.5–5.0)
Alkaline Phosphatase: 146 U/L — ABNORMAL HIGH (ref 38–126)
Anion gap: 8 (ref 5–15)
BUN: 12 mg/dL (ref 8–23)
CO2: 30 mmol/L (ref 22–32)
Calcium: 8.2 mg/dL — ABNORMAL LOW (ref 8.9–10.3)
Chloride: 100 mmol/L (ref 98–111)
Creatinine: 1.01 mg/dL (ref 0.61–1.24)
GFR, Estimated: >60 mL/min (ref >60)
Glucose, Bld: 128 mg/dL — ABNORMAL HIGH (ref 70–99)
Potassium: 3.9 mmol/L (ref 3.5–5.1)
Sodium: 138 mmol/L (ref 135–145)
Total Bilirubin: 0.8 mg/dL (ref 0.3–1.2)
Total Protein: 5.5 g/dL — ABNORMAL LOW (ref 6.5–8.1)

## 2022-10-07 LAB — MAGNESIUM: Magnesium: 1.6 mg/dL — ABNORMAL LOW (ref 1.7–2.4)

## 2022-10-07 MED ORDER — SODIUM CHLORIDE 0.9 % IV SOLN
480.0000 mg | Freq: Once | INTRAVENOUS | Status: AC
  Administered 2022-10-07: 480 mg via INTRAVENOUS
  Filled 2022-10-07: qty 48

## 2022-10-07 MED ORDER — SODIUM CHLORIDE 0.9 % IV SOLN: INTRAVENOUS | Status: DC

## 2022-10-07 MED ORDER — SODIUM CHLORIDE 0.9 % IV SOLN: Freq: Once | INTRAVENOUS | Status: AC

## 2022-10-07 MED ORDER — MIRTAZAPINE 15 MG PO TABS: 15.0000 mg | ORAL_TABLET | Freq: Every day | ORAL | 2 refills | Status: DC

## 2022-10-07 NOTE — Progress Notes (Incomplete)
Nutrition Assessment   Reason for Assessment: Wt loss   ASSESSMENT: 82 year old male with renal cancer metastatic to liver and bone. S/p rt open nephrectomy 10/15/21 followed by 9 cycles keytruda (last 05/12/22). He is currently receiving nivolumab q28d + reduced dose oral cabometyx daily. Patient is under the care of Dr. Arbutus Ped  Past medical history includes GERD, hypothyroidism, osteoarthritis, HLD, vit D deficiency, anemia  Nutrition Focused Physical Exam:   Orbital Region: *** Buccal Region: *** Upper Arm Region: *** Thoracic and Lumbar Region: *** Temple Region: *** Clavicle Bone Region: *** Shoulder and Acromion Bone Region: *** Scapular Bone Region: *** Dorsal Hand: *** Patellar Region: *** Anterior Thigh Region: *** Posterior Calf Region: *** Edema (RD assessment): *** Hair: *** Eyes: *** Mouth: *** Skin: *** Nails: ***   Medications: ferrous sulfate, zofran, klor-con, compazine, crestor   Labs: Mg 1.6, albumin 2.8   Anthropometrics:   Height: 5'10" Weight: 137 lb 6.4 oz  UBW: 160-165 lb (2023) BMI: 19.71     NUTRITION DIAGNOSIS: ***   MALNUTRITION DIAGNOSIS: ***   INTERVENTION: ***   MONITORING, EVALUATION, GOAL: ***   Next Visit: ***

## 2022-10-07 NOTE — Patient Instructions (Signed)
[facility]  Discharge Instructions: Thank you for choosing [facility] to provide your oncology and hematology care.   If you have a lab appointment with the Cancer Center, please go directly to the Cancer Center and check in at the registration area.   Wear comfortable clothing and clothing appropriate for easy access to any Portacath or PICC line.   We strive to give you quality time with your provider. You may need to reschedule your appointment if you arrive late (15 or more minutes).  Arriving late affects you and other patients whose appointments are after yours.  Also, if you miss three or more appointments without notifying the office, you may be dismissed from the clinic at the provider's discretion.      For prescription refill requests, have your pharmacy contact our office and allow 72 hours for refills to be completed.    Today you received the following chemotherapy and/or immunotherapy agents: Opdivo      To help prevent nausea and vomiting after your treatment, we encourage you to take your nausea medication as directed.  BELOW ARE SYMPTOMS THAT SHOULD BE REPORTED IMMEDIATELY: *FEVER GREATER THAN 100.4 F (38 C) OR HIGHER *CHILLS OR SWEATING *NAUSEA AND VOMITING THAT IS NOT CONTROLLED WITH YOUR NAUSEA MEDICATION *UNUSUAL SHORTNESS OF BREATH *UNUSUAL BRUISING OR BLEEDING *URINARY PROBLEMS (pain or burning when urinating, or frequent urination) *BOWEL PROBLEMS (unusual diarrhea, constipation, pain near the anus) TENDERNESS IN MOUTH AND THROAT WITH OR WITHOUT PRESENCE OF ULCERS (sore throat, sores in mouth, or a toothache) UNUSUAL RASH, SWELLING OR PAIN  UNUSUAL VAGINAL DISCHARGE OR ITCHING   Items with * indicate a potential emergency and should be followed up as soon as possible or go to the Emergency Department if any problems should occur.  Please show the CHEMOTHERAPY ALERT CARD or IMMUNOTHERAPY ALERT CARD at check-in  to the Emergency Department and triage nurse.  Should you have questions after your visit or need to cancel or reschedule your appointment, please contact [facility]  Dept: [phone]  and follow the prompts.  Office hours are 8:00 a.m. to 4:30 p.m. Monday - Friday. Please note that voicemails left after 4:00 p.m. may not be returned until the following business day.  We are closed weekends and major holidays. You have access to a nurse at all times for urgent questions. Please call the main number to the clinic Dept: [phone] and follow the prompts.   For any non-urgent questions, you may also contact your provider using MyChart. We now offer e-Visits for anyone 41 and older to request care online for non-urgent symptoms. For details visit [email or web address].   Also download the MyChart app! Go to the app store, search "MyChart", open the app, select [place], and log in with your MyChart username and password.  Dehydration, Adult Dehydration is a condition in which there is not enough water or other fluids in the body. This happens when a person loses more fluids than they take in. Important organs, such as the kidneys, brain, and heart, cannot function without a proper amount of fluids. Any loss of fluids from the body can lead to dehydration. Dehydration can be mild, moderate, or severe. It should be treated right away to prevent it from becoming severe. What are the causes? Dehydration may be caused by: Health conditions, such as diarrhea, vomiting, fever, infection, or sweating or urinating a lot. Not drinking enough fluids. Certain  medicines, such as medicines that remove excess fluid from the body (diuretics). Lack of safe drinking water. Not being able to get enough water and food. What increases the risk? The following factors may make you more likely to develop this condition: Having a long-term (chronic) illness that has not been  treated properly, such as diabetes, heart disease, or kidney disease. Being 36 years of age or older. Having a disability. Living in a place that is high in altitude, where thinner, drier air causes more fluid loss. Doing exercises that put stress on your body for a long time (endurance sports). Being active in a hot climate. What are the signs or symptoms? Symptoms of dehydration depend on how severe it is. Mild or moderate dehydration Thirst. Dry lips or dry mouth. Dizziness or light-headedness. Muscle cramps. Dark urine. Urine may be the color of tea. Less urine or tears produced than usual. Headache. Severe dehydration Changes in skin. Your skin may be cold and clammy, blotchy, or pale. Your skin also may not return to normal after being lightly pinched and released. Little or no tears, urine, or sweat. Rapid breathing and low blood pressure. Your pulse may be weak or may be faster than 100 beats per minute when you are sitting still. Other changes, such as: Feeling very thirsty. Sunken eyes. Cold hands and feet. Confusion. Being very tired (lethargic) or having trouble waking from sleep. Short-term weight loss. Loss of consciousness. How is this diagnosed? This condition is diagnosed based on your symptoms and a physical exam. You may have blood and urine tests to help confirm the diagnosis. How is this treated? Treatment for this condition depends on how severe it is. Treatment should be started right away. Do not wait until dehydration becomes severe. Severe dehydration is an emergency and needs to be treated in a hospital. Mild or moderate dehydration can be treated at home. You may be asked to: Drink more fluids. Drink an oral rehydration solution (ORS). This drink restores fluids, salts, and minerals in the blood (electrolytes). Stop any activities that caused dehydration, such as exercise. Cool off with cool compresses, cool mist, or cool fluids, if heat or too much  sweat caused your condition. Take medicine to treat fever, if fever caused your condition. Take medicine to treat nausea and diarrhea, if vomiting or diarrhea caused your condition. Severe dehydration can be treated: With IV fluids. By correcting abnormal levels of electrolytes in your body. By treating the underlying cause of dehydration. Follow these instructions at home: Oral rehydration solution If told by your health care provider, drink an ORS: Make an ORS by following instructions on the package. Start by drinking small amounts, about  cup (120 mL) every 5-10 minutes. Slowly increase how much you drink until you have taken the amount recommended by your health care provider.  Eating and drinking  Drink enough clear fluid to keep your urine pale yellow. If you were told to drink an ORS, finish the ORS first and then start slowly drinking other clear fluids. Drink fluids such as: Water. Do not drink only water. Doing that can lead to hyponatremia, which is having too little salt (sodium) in the body. Water from ice chips you suck on. Diluted fruit juice. This is fruit juice that you have added water to. Low-calorie sports drinks. Eat foods that contain a healthy balance of electrolytes, such as bananas, oranges, potatoes, tomatoes, and spinach. Do not drink alcohol. Avoid the following: Drinks that contain a lot of sugar. These  include high-calorie sports drinks, fruit juice that is not diluted, and soda. Caffeine. Foods that are greasy or contain a lot of fat or sugar. General instructions Take over-the-counter and prescription medicines only as told by your health care provider. Do not take sodium tablets. Doing that can lead to having too much sodium in the body (hypernatremia). Return to your normal activities as told by your health care provider. Ask your health care provider what activities are safe for you. Keep all follow-up visits. Your health care provider may need to  check your progress and suggest new ways to treat your condition. Contact a health care provider if: You have muscle cramps, pain, or discomfort, such as: Pain in your abdomen and the pain gets worse or stays in one area. Stiff neck. You have a rash. You are more irritable than usual. You are sleepier or have a harder time waking. You feel weak or dizzy. You feel very thirsty. Get help right away if: You have symptoms of severe dehydration. You vomit every time you eat or drink. Your vomiting gets worse, does not go away, or includes blood or green matter (bile). You are getting treatment but symptoms are getting worse. You have a fever. You have a severe headache. You have: Diarrhea that gets worse or does not go away. Blood in your stool. This may cause stool to look black and tarry. Not urinating, or urinating only a small amount of very dark urine, within 6-8 hours. You have trouble breathing. These symptoms may be an emergency. Get help right away. Do not wait to see if the symptoms will go away. Do not drive yourself to the hospital. Call 911. This information is not intended to replace advice given to you by your health care provider. Make sure you discuss any questions you have with your health care provider. Document Revised: 11/17/2021 Document Reviewed: 11/17/2021 Elsevier Patient Education  2024 [employer].

## 2022-10-08 ENCOUNTER — Ambulatory Visit: Payer: Medicare Other | Admitting: Physician Assistant

## 2022-10-08 ENCOUNTER — Other Ambulatory Visit: Payer: Self-pay

## 2022-10-08 ENCOUNTER — Other Ambulatory Visit (HOSPITAL_COMMUNITY): Payer: Self-pay

## 2022-10-08 ENCOUNTER — Other Ambulatory Visit: Payer: Medicare Other

## 2022-10-08 ENCOUNTER — Ambulatory Visit: Payer: Medicare Other

## 2022-10-12 ENCOUNTER — Telehealth: Payer: Self-pay | Admitting: Medical Oncology

## 2022-10-12 ENCOUNTER — Other Ambulatory Visit: Payer: Self-pay | Admitting: Medical Oncology

## 2022-10-12 DIAGNOSIS — R531 Weakness: Secondary | ICD-10-CM

## 2022-10-12 DIAGNOSIS — E860 Dehydration: Secondary | ICD-10-CM

## 2022-10-12 DIAGNOSIS — C649 Malignant neoplasm of unspecified kidney, except renal pelvis: Secondary | ICD-10-CM

## 2022-10-12 NOTE — Telephone Encounter (Signed)
  Wife calling to request IVF this week. Vada is weak , decreased strength endurance. Needs assist to bathroom.  Pt referred to [facility] for physical therapy evaluate and treat.

## 2022-10-12 NOTE — Telephone Encounter (Signed)
Appt for IVF given to wife.

## 2022-10-13 ENCOUNTER — Inpatient Hospital Stay: Payer: Medicare Other

## 2022-10-13 DIAGNOSIS — R531 Weakness: Secondary | ICD-10-CM

## 2022-10-13 DIAGNOSIS — C649 Malignant neoplasm of unspecified kidney, except renal pelvis: Secondary | ICD-10-CM | POA: Diagnosis not present

## 2022-10-13 MED ORDER — SODIUM CHLORIDE 0.9 % IV SOLN: INTRAVENOUS | Status: DC

## 2022-10-13 NOTE — Patient Instructions (Signed)

## 2022-10-14 ENCOUNTER — Encounter: Payer: PRIVATE HEALTH INSURANCE | Admitting: Dietician

## 2022-10-14 LAB — LAB REPORT - SCANNED: EGFR: 70

## 2022-10-15 ENCOUNTER — Ambulatory Visit (INDEPENDENT_AMBULATORY_CARE_PROVIDER_SITE_OTHER): Payer: Medicare Other | Admitting: Podiatry

## 2022-10-15 ENCOUNTER — Other Ambulatory Visit: Payer: Self-pay | Admitting: Physician Assistant

## 2022-10-15 ENCOUNTER — Telehealth: Payer: Self-pay | Admitting: Medical Oncology

## 2022-10-15 ENCOUNTER — Encounter: Payer: Self-pay | Admitting: Podiatry

## 2022-10-15 DIAGNOSIS — M216X2 Other acquired deformities of left foot: Secondary | ICD-10-CM

## 2022-10-15 DIAGNOSIS — M7741 Metatarsalgia, right foot: Secondary | ICD-10-CM

## 2022-10-15 DIAGNOSIS — R197 Diarrhea, unspecified: Secondary | ICD-10-CM

## 2022-10-15 DIAGNOSIS — L84 Corns and callosities: Secondary | ICD-10-CM | POA: Diagnosis not present

## 2022-10-15 DIAGNOSIS — M216X1 Other acquired deformities of right foot: Secondary | ICD-10-CM

## 2022-10-15 DIAGNOSIS — M7742 Metatarsalgia, left foot: Secondary | ICD-10-CM

## 2022-10-15 MED ORDER — DIPHENOXYLATE-ATROPINE 2.5-0.025 MG PO TABS
1.0000 | ORAL_TABLET | Freq: Four times a day (QID) | ORAL | 0 refills | Status: DC | PRN
Start: 2022-10-15 — End: 2023-01-05

## 2022-10-15 NOTE — Telephone Encounter (Signed)
Request Lomotil refill-  He has 5 tablets left. "Micheal Mcintyre is doing really well and I would like to have in just in case he needs it over the weekend". PT is coming to see him today .

## 2022-10-15 NOTE — Progress Notes (Signed)
  Subjective:  Patient ID: Micheal Mcintyre, male    DOB: Sep 27, 1940,   MRN: [id number]  Chief Complaint  Patient presents with   Callouses    Bilateral callus     82 y.o. male presents for follow-up of calluses . Relates they have been betadine on the areas.    He has kidney cancer and has been on chemotherapy. Denies any other pedal complaints. Denies n/v/f/c.   Past Medical History:  Diagnosis Date   Arthritis    GERD (gastroesophageal reflux disease)    Hypertension    Medicare annual wellness visit, subsequent 08/19/2014   Osteoarthritis 09/21/2007   Qualifier: Diagnosis of  By: Alphonzo Severance MD, Ivar Drape R    PONV (postoperative nausea and vomiting)    Preventative health care 06/07/2016   Right shoulder pain 12/03/2016    Objective:  Physical Exam: Vascular: DP/PT pulses 2/4 bilateral. CFT <3 seconds. Normal hair growth on digits. No edema.  Skin. No lacerations or abrasions bilateral feet. Hyperkeratotic lesiosn noted bilateral sub second metatarsal improved from prior very minimal buildup.  There is maceration and erythema noted in the fourth interspace on the right foot but greatly improved.  Musculoskeletal: MMT 5/5 bilateral lower extremities in DF, PF, Inversion and Eversion. Deceased ROM in DF of ankle joint. Tender to ball of foot bilateral with pes cavus noted bilateral and plantar flexed metatarsals.  Neurological: Sensation intact to light touch.   Assessment:   1. Pre-ulcerative calluses   2. Metatarsalgia of both feet   3. Plantar flexed metatarsal bone of left foot   4. Plantar flexed metatarsal, right         Plan:  Patient was evaluated and treated and all questions answered. Pre-ulcerative area in right fourth itnerspace with minimal maceration much improved.   Pre-ulcerative areas to bilateral plantar second metatarsals.  -No debridement necessary  -No abx indicated.  -Discussed if any worsening redness, pain, fever or chills to call or may need to  report to the emergency room. Patient expressed understanding.  Discussed trying to see if we can get orthotics as these would be usefully to offload his pre-ulcerative areas and prevent wounds and pain.  Return as needed for buildup of calluses.     Return if symptoms worsen or fail to improve.     Louann Sjogren, DPM

## 2022-10-15 NOTE — Telephone Encounter (Signed)
PT requests orders to see pt 2 x week for 3 then , 1x week for 3. Also rquests OT referral.fro ADLs. She will send orders for [facility] to sign.

## 2022-10-16 ENCOUNTER — Inpatient Hospital Stay: Payer: Medicare Other

## 2022-10-19 ENCOUNTER — Encounter: Payer: Self-pay | Admitting: Internal Medicine

## 2022-10-20 ENCOUNTER — Inpatient Hospital Stay: Payer: Medicare Other

## 2022-10-20 ENCOUNTER — Telehealth: Payer: Self-pay | Admitting: Medical Oncology

## 2022-10-20 NOTE — Telephone Encounter (Signed)
Pt better- wife reported Micheal Mcintyre is improving and felt stronger. He stopped the Marinol ( too sleepy ) and is able to increase his appetite on his own.    ADL's-Per Dr. Arbutus Ped , it is ok for OT to see pt 1 x week x 4 weeks.

## 2022-10-29 ENCOUNTER — Encounter: Payer: Self-pay | Admitting: Internal Medicine

## 2022-10-30 ENCOUNTER — Ambulatory Visit (HOSPITAL_COMMUNITY)
Admission: RE | Admit: 2022-10-30 | Discharge: 2022-10-30 | Disposition: A | Discharge status: Home / Self Care | Payer: Medicare Other | Source: Ambulatory Visit | Attending: Physician Assistant | Admitting: Physician Assistant

## 2022-10-30 DIAGNOSIS — C787 Secondary malignant neoplasm of liver and intrahepatic bile duct: Secondary | ICD-10-CM | POA: Diagnosis not present

## 2022-10-30 DIAGNOSIS — Z905 Acquired absence of kidney: Secondary | ICD-10-CM | POA: Insufficient documentation

## 2022-10-30 DIAGNOSIS — C7800 Secondary malignant neoplasm of unspecified lung: Secondary | ICD-10-CM | POA: Insufficient documentation

## 2022-10-30 DIAGNOSIS — C649 Malignant neoplasm of unspecified kidney, except renal pelvis: Secondary | ICD-10-CM | POA: Insufficient documentation

## 2022-10-30 DIAGNOSIS — I700 Atherosclerosis of aorta: Secondary | ICD-10-CM | POA: Diagnosis not present

## 2022-11-02 ENCOUNTER — Other Ambulatory Visit: Payer: Self-pay

## 2022-11-03 ENCOUNTER — Encounter: Payer: Self-pay | Admitting: Nephrology

## 2022-11-03 ENCOUNTER — Other Ambulatory Visit (HOSPITAL_COMMUNITY): Payer: Self-pay

## 2022-11-04 ENCOUNTER — Inpatient Hospital Stay: Payer: Medicare Other | Attending: Oncology

## 2022-11-04 ENCOUNTER — Other Ambulatory Visit: Payer: Self-pay

## 2022-11-04 ENCOUNTER — Inpatient Hospital Stay: Payer: Medicare Other

## 2022-11-04 ENCOUNTER — Inpatient Hospital Stay: Payer: Medicare Other | Admitting: Dietician

## 2022-11-04 ENCOUNTER — Inpatient Hospital Stay (HOSPITAL_BASED_OUTPATIENT_CLINIC_OR_DEPARTMENT_OTHER): Payer: Medicare Other | Admitting: Internal Medicine

## 2022-11-04 DIAGNOSIS — C787 Secondary malignant neoplasm of liver and intrahepatic bile duct: Secondary | ICD-10-CM | POA: Diagnosis not present

## 2022-11-04 DIAGNOSIS — C649 Malignant neoplasm of unspecified kidney, except renal pelvis: Secondary | ICD-10-CM

## 2022-11-04 DIAGNOSIS — Z5112 Encounter for antineoplastic immunotherapy: Secondary | ICD-10-CM | POA: Diagnosis present

## 2022-11-04 DIAGNOSIS — C7800 Secondary malignant neoplasm of unspecified lung: Secondary | ICD-10-CM | POA: Insufficient documentation

## 2022-11-04 DIAGNOSIS — Z79620 Long term (current) use of immunosuppressive biologic: Secondary | ICD-10-CM | POA: Insufficient documentation

## 2022-11-04 DIAGNOSIS — C641 Malignant neoplasm of right kidney, except renal pelvis: Secondary | ICD-10-CM | POA: Insufficient documentation

## 2022-11-04 DIAGNOSIS — C7951 Secondary malignant neoplasm of bone: Secondary | ICD-10-CM | POA: Insufficient documentation

## 2022-11-04 DIAGNOSIS — Z905 Acquired absence of kidney: Secondary | ICD-10-CM | POA: Diagnosis not present

## 2022-11-04 LAB — CBC WITH DIFFERENTIAL (CANCER CENTER ONLY)
Abs Immature Granulocytes: 0.02 10*3/uL (ref 0.00–0.07)
Basophils Absolute: 0 10*3/uL (ref 0.0–0.1)
Basophils Relative: 1 %
Eosinophils Absolute: 0.1 10*3/uL (ref 0.0–0.5)
Eosinophils Relative: 3 %
HCT: 29.8 % — ABNORMAL LOW (ref 39.0–52.0)
Hemoglobin: 10 g/dL — ABNORMAL LOW (ref 13.0–17.0)
Immature Granulocytes: 1 %
Lymphocytes Relative: 16 %
Lymphs Abs: 0.7 10*3/uL (ref 0.7–4.0)
MCH: 36.6 pg — ABNORMAL HIGH (ref 26.0–34.0)
MCHC: 33.6 g/dL (ref 30.0–36.0)
MCV: 109.2 fL — ABNORMAL HIGH (ref 80.0–100.0)
Monocytes Absolute: 0.5 10*3/uL (ref 0.1–1.0)
Monocytes Relative: 12 %
Neutro Abs: 3 10*3/uL (ref 1.7–7.7)
Neutrophils Relative %: 67 %
Platelet Count: 237 10*3/uL (ref 150–400)
RBC: 2.73 MIL/uL — ABNORMAL LOW (ref 4.22–5.81)
RDW: 17.1 % — ABNORMAL HIGH (ref 11.5–15.5)
WBC Count: 4.3 10*3/uL (ref 4.0–10.5)
nRBC: 0 % (ref 0.0–0.2)

## 2022-11-04 LAB — CMP (CANCER CENTER ONLY)
ALT: 14 U/L (ref 0–44)
AST: 16 U/L (ref 15–41)
Albumin: 3.1 g/dL — ABNORMAL LOW (ref 3.5–5.0)
Alkaline Phosphatase: 87 U/L (ref 38–126)
Anion gap: 7 (ref 5–15)
BUN: 10 mg/dL (ref 8–23)
CO2: 27 mmol/L (ref 22–32)
Calcium: 8.9 mg/dL (ref 8.9–10.3)
Chloride: 107 mmol/L (ref 98–111)
Creatinine: 0.9 mg/dL (ref 0.61–1.24)
GFR, Estimated: >60 mL/min (ref >60)
Glucose, Bld: 139 mg/dL — ABNORMAL HIGH (ref 70–99)
Potassium: 4.1 mmol/L (ref 3.5–5.1)
Sodium: 141 mmol/L (ref 135–145)
Total Bilirubin: 0.5 mg/dL (ref 0.3–1.2)
Total Protein: 5.4 g/dL — ABNORMAL LOW (ref 6.5–8.1)

## 2022-11-04 LAB — TSH: TSH: 2.374 u[IU]/mL (ref 0.350–4.500)

## 2022-11-04 MED ORDER — SODIUM CHLORIDE 0.9 % IV SOLN
480.0000 mg | Freq: Once | INTRAVENOUS | Status: AC
  Administered 2022-11-04: 480 mg via INTRAVENOUS
  Filled 2022-11-04: qty 48

## 2022-11-04 MED ORDER — SODIUM CHLORIDE 0.9 % IV SOLN: Freq: Once | INTRAVENOUS | Status: AC

## 2022-11-04 NOTE — Progress Notes (Signed)
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  PRIOR THERAPY:  1) Status post right open radical nephrectomy completed on October 15, 2021 by Dr. Jettie Pagan.  2) Keytruda 200 Mg IV every 3 weeks started December 03, 2021.  Status post 9 cycles.  Last dose was given May 21, 2022.  This was discontinued secondary to disease progression  CURRENT THERAPY: Systemic treatment with nivolumab 480 Mg IV every 4 weeks in addition to Cabometyx 40 mg p.o. daily.  First dose June 18, 2022.  Status post 5 cycles.  His Cabometyx has been on hold recently due to adverse side effects. His dose was reduced in May to 20 mg p.o. daily but he continues to have weakness, diarrhea, and dehydration. This has been on hold since 5/19.   INTERVAL HISTORY: Micheal Mcintyre 82 y.o. male returns to the clinic today for follow-up visit accompanied by his wife.  The patient is feeling fine today with no concerning complaints except for few pounds of weight loss.  He is very active and doing more recently.  He denied having any current chest pain but has shortness of breath with exertion with no cough or hemoptysis.  He has no nausea, vomiting, diarrhea or constipation.  He has no headache or visual changes.  He had repeat CT scan of the chest, abdomen and pelvis performed recently and he is here for evaluation and discussion of his scan results.   MEDICAL HISTORY: Past Medical History:  Diagnosis Date   Arthritis    GERD (gastroesophageal reflux disease)    Hypertension    Medicare annual wellness visit, subsequent 08/19/2014   Osteoarthritis 09/21/2007   Qualifier: Diagnosis of  By: Alphonzo Severance  MD, Ivar Drape R    PONV (postoperative nausea and vomiting)    Preventative health care 06/07/2016   Right shoulder pain 12/03/2016    ALLERGIES:  is allergic to statins.  MEDICATIONS:  Current Outpatient Medications  Medication Sig Dispense Refill   acetaminophen (TYLENOL) 500 MG tablet Take 1,000 mg by mouth every 6 (six) hours as needed for moderate pain.     aluminum hydroxide-magnesium carbonate (GAVISCON) 95-358 MG/15ML SUSP Take 15 mLs by mouth as needed for heartburn or indigestion. (Patient not taking: Reported on 09/09/2022)     amLODipine (NORVASC) 5 MG tablet Take 5 mg by mouth daily.     cabozantinib (CABOMETYX) 20 MG tablet Take 1 tablet (20 mg total) by mouth daily. Take on an empty stomach, 1 hour before or 2 hours after meals. 30 tablet 3   Cholecalciferol (VITAMIN D3) 10 MCG (400 UNIT) tablet Take 800 Units by mouth daily.     diclofenac Sodium (VOLTAREN) 1 % GEL Apply 1 application. topically 4 (four) times daily as needed (pain).     diphenoxylate-atropine (LOMOTIL) 2.5-0.025 MG tablet Take 1 tablet by mouth 4 (four) times daily as needed for diarrhea or loose stools. 30 tablet 0   dronabinol (MARINOL) 2.5 MG capsule Take 1 capsule (2.5 mg total) by mouth 2 (two) times daily before a meal. 60 capsule 0   ferrous sulfate 325 (65 FE) MG tablet Take 325 mg by mouth 4 (four) times a  week.     ibuprofen (ADVIL) 200 MG tablet Take 200 mg by mouth daily. (Patient not taking: Reported on 09/09/2022)     levothyroxine (SYNTHROID) 50 MCG tablet Take 50 mcg by mouth daily.     magnesium chloride (SLOW-MAG) 64 MG TBEC SR tablet Take 1 tablet (64 mg total) by mouth 2 (two) times daily. 60 tablet 0   mirtazapine (REMERON) 15 MG tablet Take 1 tablet (15 mg total) by mouth at bedtime. 30 tablet 2   Multiple Vitamin (MULTIVITAMIN WITH MINERALS) TABS tablet Take 1 tablet by mouth 4 (four) times a week.     mupirocin ointment (BACTROBAN) 2 % 1 app applied topically 3 times a day for 5 day(s)      Omega-3 350 MG CPDR Take 350 mg by mouth daily.     ondansetron (ZOFRAN) 8 MG tablet Take 1 tablet (8 mg total) by mouth every 8 (eight) hours as needed for nausea or vomiting. 20 tablet 3   potassium chloride SA (KLOR-CON M) 20 MEQ tablet Take 1 tablet (20 mEq total) by mouth daily. 3 tablet 0   prochlorperazine (COMPAZINE) 10 MG tablet TAKE 1 TABLET(10 MG) BY MOUTH EVERY 6 HOURS AS NEEDED FOR NAUSEA OR VOMITING 30 tablet 0   rosuvastatin (CRESTOR) 20 MG tablet Take 0.5 tablets (10 mg total) by mouth daily. 90 tablet 3   telmisartan (MICARDIS) 80 MG tablet Take 0.5 tablets (40 mg total) by mouth 2 (two) times daily. 180 tablet 1   TURMERIC PO Take 1 tablet by mouth 3 (three) times a week.     No current facility-administered medications for this visit.    SURGICAL HISTORY:  Past Surgical History:  Procedure Laterality Date   CHOLECYSTECTOMY     HIP SURGERY     JOINT REPLACEMENT     NEPHRECTOMY Right 10/15/2021   Procedure: NEPHRECTOMY, OPEN;  Surgeon: Jannifer Hick, MD;  Location: [facility];  Service: Urology;  Laterality: Right;   nose skin cancer removal     TOTAL HIP ARTHROPLASTY      REVIEW OF SYSTEMS:  Constitutional: positive for fatigue and weight loss Eyes: negative Ears, nose, mouth, throat, and face: negative Respiratory: negative Cardiovascular: negative Gastrointestinal: negative Genitourinary:negative Integument/breast: negative Hematologic/lymphatic: negative Musculoskeletal:negative Neurological: negative Behavioral/Psych: negative Endocrine: negative Allergic/Immunologic: negative   PHYSICAL EXAMINATION: General appearance: alert, cooperative, fatigued, and no distress Head: Normocephalic, without obvious abnormality, atraumatic Neck: no adenopathy, no JVD, supple, symmetrical, trachea midline, and thyroid not enlarged, symmetric, no tenderness/mass/nodules Lymph nodes: Cervical, supraclavicular, and axillary nodes normal. Resp: clear to auscultation  bilaterally Back: symmetric, no curvature. ROM normal. No CVA tenderness. Cardio: regular rate and rhythm, S1, S2 normal, no murmur, click, rub or gallop GI: soft, non-tender; bowel sounds normal; no masses,  no organomegaly Extremities: extremities normal, atraumatic, no cyanosis or edema Neurologic: Alert and oriented X 3, normal strength and tone. Normal symmetric reflexes. Normal coordination and gait  ECOG PERFORMANCE STATUS: 1 - Symptomatic but completely ambulatory  Blood pressure 104/64, pulse 77, temperature 98.1 F (36.7 C), temperature source Oral, resp. rate 16, height 5\' 10"  (1.778 m), weight 133 lb 8 oz (60.6 kg), SpO2 100 %.  LABORATORY DATA: Lab Results  Component Value Date   WBC 4.3 11/04/2022   HGB 10.0 (L) 11/04/2022   HCT 29.8 (L) 11/04/2022   MCV 109.2 (H) 11/04/2022   PLT 237 11/04/2022      Chemistry      Component Value Date/Time   NA 138 10/07/2022  1407   NA 140 04/22/2022 0000   K 3.9 10/07/2022 1407   CL 100 10/07/2022 1407   CO2 30 10/07/2022 1407   BUN 12 10/07/2022 1407   BUN 21 04/22/2022 0000   CREATININE 1.01 10/07/2022 1407   CREATININE 1.09 02/06/2020 1306   GLU 88 04/22/2022 0000      Component Value Date/Time   CALCIUM 8.2 (L) 10/07/2022 1407   ALKPHOS 146 (H) 10/07/2022 1407   AST 28 10/07/2022 1407   ALT 30 10/07/2022 1407   BILITOT 0.8 10/07/2022 1407       RADIOGRAPHIC STUDIES: CT CHEST ABDOMEN PELVIS WO CONTRAST  Result Date: 11/02/2022 CLINICAL DATA:  Metastatic renal cell carcinoma. Evaluate treatment response. Right chemotherapy. Ongoing chemotherapy. Asymptomatic. * Tracking Code: BO * EXAM: CT CHEST, ABDOMEN AND PELVIS WITHOUT CONTRAST TECHNIQUE: Multidetector CT imaging of the chest, abdomen and pelvis was performed following the standard protocol without IV contrast. RADIATION DOSE REDUCTION: This exam was performed according to the departmental dose-optimization program which includes automated exposure control,  adjustment of the mA and/or kV according to patient size and/or use of iterative reconstruction technique. COMPARISON:  09/07/2022 FINDINGS: CT CHEST FINDINGS Cardiovascular: Aortic atherosclerosis. Normal heart size, without pericardial effusion. Mediastinum/Nodes: No mediastinal or hilar adenopathy. Lungs/Pleura: No pleural fluid. Bilateral pulmonary nodules. Index 5 mm nodule on 106/6 is similar to on the prior. Index lingular nodule measures 4 mm on 98/6 versus 5 mm on the prior. Index left lower lobe pulmonary nodule with minimal central cavitation measures 8 mm on 126/6 versus 10 mm previously. New areas of focal ground-glass and septal thickening including within the posterolateral left upper lobe at 2.6 cm on 77/6, anterior left upper lobe at 4.9 cm on 89/6, and superior segment left lower lobe at 8 mm on 63/6. Musculoskeletal: No acute osseous abnormality. CT ABDOMEN PELVIS FINDINGS Hepatobiliary: Low-density liver lesions indicative of known metastasis. Index high right hepatic lobe 2.5 x 2.7 cm lesion on 49/2 measured 2.3 x 2.4 Cm on the prior. Index segment 5 lesion measures 1.8 x 1.8 cm on 60/2 versus 1.9 x 1.6 cm on the prior. Segment 6 subcapsular lesion measures 1.1 cm on 54/2 and is similar to on the prior (when remeasured). Suspect gallbladder sludge or stones in the fundus on 61/2. No acute cholecystitis or biliary duct dilatation. Pancreas: Pancreatic atrophy. No duct dilatation or acute inflammation. Spleen: Normal in size, without focal abnormality. Adrenals/Urinary Tract: Normal adrenal glands. Right nephrectomy without local recurrence. No left renal calculi or hydronephrosis. Subcentimeter upper and interpolar left renal lesions are low-density and likely cysts . In the absence of clinically indicated signs/symptoms require(s) no independent follow-up. No bladder calculi. Stomach/Bowel: Normal stomach, without wall thickening. Normal colon and terminal ileum. Normal small bowel.  Vascular/Lymphatic: Aortic atherosclerosis. No abdominopelvic adenopathy. Reproductive: Mild prostatomegaly. Other: No significant free fluid. Musculoskeletal: Right hip arthroplasty with resultant mild beam hardening artifact in the pelvis. Advanced left hip osteoarthritis, secondary to chronic developmental dysplasia, dislocation, and pseudoarthrosis. Advanced lumbosacral spondylosis. Convex right thoracolumbar spine curvature. IMPRESSION: CT CHEST IMPRESSION 1. Minimal improvement in pulmonary metastasis. 2. Areas of left upper and less so lower lobe ground-glass or new. Considerations include atypical infection or drug toxicity. Radiation change could have this appearance, but no such history is identified in the EMR. 3. No thoracic adenopathy. 4.  Aortic Atherosclerosis (ICD10-I70.0). CT ABDOMEN AND PELVIS IMPRESSION 1. Hepatic metastasis, with minimal progression of a hepatic dome lesion. 2. No local recurrence after right nephrectomy. Electronically Signed  By: Jeronimo Greaves M.D.   On: 11/02/2022 14:59    ASSESSMENT AND PLAN: This is a very pleasant 82 years old white male diagnosed with metastatic papillary renal cell carcinoma initially diagnosed as stage IIIa (T3a, N0, M0) papillary renal cell carcinoma with rhabdoid features diagnosed in June 2023 status post right open radical nephrectomy.  The patient has evidence for metastatic disease to the liver and bone in February 2024. The patient underwent treatment with adjuvant immunotherapy with Keytruda 200 Mg IV every 3 weeks status post 9 cycles.   He had MRI of the abdomen and pelvis performed recently.  I personally and independently reviewed the imaging studies and discussed the result with the patient today. Unfortunately his MRI showed multiple new rim-hypoenhancing lesions throughout the liver consistent with metastatic disease.  There was also a rim enhancing lesion of the inferior endplate of L1 measuring 1.2 x 1.1 cm concerning for metastatic  disease.  There is no evidence of local recurrence or lymphadenopathy. I recommended for the patient to discontinue his current adjuvant treatment with [facility] because of lack of efficacy and the disease progression. We started the patient on palliative treatment with a combination of nivolumab 480 Mg IV every 4 weeks in addition to Cabometyx 40 mg p.o. daily.  First dose was given on June 18, 2022.  Status post 5 cycles.  His treatment was Cabometyx which was reduced to 20 mg p.o. daily has been on hold since Sep 20, 2022 secondary to intolerance and the patient has been on treatment with single agent nivolumab every 4 weeks and tolerating this treatment much better. He had repeat CT scan of the chest, abdomen and pelvis performed recently.  I personally and independently reviewed the scan and discussed the result with the patient and his wife today. His scan showed mild improvement in the pulmonary metastasis but slight increase in 2 of the liver lesions. I recommended for the patient to continue his current treatment with nivolumab for now.  Will continue to monitor him closely for the upcoming imaging studies and if needed we may consider resuming his treatment with Cabometyx.  The patient was not feeling well at all when he was on Cabometyx and he preferred to stay off this treatment for now if possible. I will see him back for follow-up visit in 4 weeks for evaluation before the next cycle of his treatment. The patient was advised to call immediately if he has any other concerning symptoms in the interval. The patient voices understanding of current disease status and treatment options and is in agreement with the current care plan.  All questions were answered. The patient knows to call the clinic with any problems, questions or concerns. We can certainly see the patient much sooner if necessary.  The total time spent in the appointment was 30 minutes.  Disclaimer: This note was dictated  with voice recognition software. Similar sounding words can inadvertently be transcribed and may not be corrected upon review.

## 2022-11-04 NOTE — Patient Instructions (Signed)
[facility]  Discharge Instructions: Thank you for choosing [facility] to provide your oncology and hematology care.   If you have a lab appointment with the Cancer Center, please go directly to the Cancer Center and check in at the registration area.   Wear comfortable clothing and clothing appropriate for easy access to any Portacath or PICC line.   We strive to give you quality time with your provider. You may need to reschedule your appointment if you arrive late (15 or more minutes).  Arriving late affects you and other patients whose appointments are after yours.  Also, if you miss three or more appointments without notifying the office, you may be dismissed from the clinic at the provider's discretion.      For prescription refill requests, have your pharmacy contact our office and allow 72 hours for refills to be completed.    Today you received the following chemotherapy and/or immunotherapy agents: Opdivo      To help prevent nausea and vomiting after your treatment, we encourage you to take your nausea medication as directed.  BELOW ARE SYMPTOMS THAT SHOULD BE REPORTED IMMEDIATELY: *FEVER GREATER THAN 100.4 F (38 C) OR HIGHER *CHILLS OR SWEATING *NAUSEA AND VOMITING THAT IS NOT CONTROLLED WITH YOUR NAUSEA MEDICATION *UNUSUAL SHORTNESS OF BREATH *UNUSUAL BRUISING OR BLEEDING *URINARY PROBLEMS (pain or burning when urinating, or frequent urination) *BOWEL PROBLEMS (unusual diarrhea, constipation, pain near the anus) TENDERNESS IN MOUTH AND THROAT WITH OR WITHOUT PRESENCE OF ULCERS (sore throat, sores in mouth, or a toothache) UNUSUAL RASH, SWELLING OR PAIN  UNUSUAL VAGINAL DISCHARGE OR ITCHING   Items with * indicate a potential emergency and should be followed up as soon as possible or go to the Emergency Department if any problems should occur.  Please show the CHEMOTHERAPY ALERT CARD or IMMUNOTHERAPY ALERT CARD at check-in  to the Emergency Department and triage nurse.  Should you have questions after your visit or need to cancel or reschedule your appointment, please contact [facility]  Dept: [phone]  and follow the prompts.  Office hours are 8:00 a.m. to 4:30 p.m. Monday - Friday. Please note that voicemails left after 4:00 p.m. may not be returned until the following business day.  We are closed weekends and major holidays. You have access to a nurse at all times for urgent questions. Please call the main number to the clinic Dept: [phone] and follow the prompts.   For any non-urgent questions, you may also contact your provider using MyChart. We now offer e-Visits for anyone 18 and older to request care online for non-urgent symptoms. For details visit [email or web address].   Also download the MyChart app! Go to the app store, search "MyChart", open the app, select [facility], and log in with your MyChart username and password.  Dehydration, Adult Dehydration is a condition in which there is not enough water or other fluids in the body. This happens when a person loses more fluids than they take in. Important organs, such as the kidneys, brain, and heart, cannot function without a proper amount of fluids. Any loss of fluids from the body can lead to dehydration. Dehydration can be mild, moderate, or severe. It should be treated right away to prevent it from becoming severe. What are the causes? Dehydration may be caused by: Health conditions, such as diarrhea, vomiting, fever, infection, or sweating or urinating a lot. Not drinking enough fluids. Certain   medicines, such as medicines that remove excess fluid from the body (diuretics). Lack of safe drinking water. Not being able to get enough water and food. What increases the risk? The following factors may make you more likely to develop this condition: Having a long-term (chronic) illness that has not been  treated properly, such as diabetes, heart disease, or kidney disease. Being 65 years of age or older. Having a disability. Living in a place that is high in altitude, where thinner, drier air causes more fluid loss. Doing exercises that put stress on your body for a long time (endurance sports). Being active in a hot climate. What are the signs or symptoms? Symptoms of dehydration depend on how severe it is. Mild or moderate dehydration Thirst. Dry lips or dry mouth. Dizziness or light-headedness. Muscle cramps. Dark urine. Urine may be the color of tea. Less urine or tears produced than usual. Headache. Severe dehydration Changes in skin. Your skin may be cold and clammy, blotchy, or pale. Your skin also may not return to normal after being lightly pinched and released. Little or no tears, urine, or sweat. Rapid breathing and low blood pressure. Your pulse may be weak or may be faster than 100 beats per minute when you are sitting still. Other changes, such as: Feeling very thirsty. Sunken eyes. Cold hands and feet. Confusion. Being very tired (lethargic) or having trouble waking from sleep. Short-term weight loss. Loss of consciousness. How is this diagnosed? This condition is diagnosed based on your symptoms and a physical exam. You may have blood and urine tests to help confirm the diagnosis. How is this treated? Treatment for this condition depends on how severe it is. Treatment should be started right away. Do not wait until dehydration becomes severe. Severe dehydration is an emergency and needs to be treated in a hospital. Mild or moderate dehydration can be treated at home. You may be asked to: Drink more fluids. Drink an oral rehydration solution (ORS). This drink restores fluids, salts, and minerals in the blood (electrolytes). Stop any activities that caused dehydration, such as exercise. Cool off with cool compresses, cool mist, or cool fluids, if heat or too much  sweat caused your condition. Take medicine to treat fever, if fever caused your condition. Take medicine to treat nausea and diarrhea, if vomiting or diarrhea caused your condition. Severe dehydration can be treated: With IV fluids. By correcting abnormal levels of electrolytes in your body. By treating the underlying cause of dehydration. Follow these instructions at home: Oral rehydration solution If told by your health care provider, drink an ORS: Make an ORS by following instructions on the package. Start by drinking small amounts, about  cup (120 mL) every 5-10 minutes. Slowly increase how much you drink until you have taken the amount recommended by your health care provider.  Eating and drinking  Drink enough clear fluid to keep your urine pale yellow. If you were told to drink an ORS, finish the ORS first and then start slowly drinking other clear fluids. Drink fluids such as: Water. Do not drink only water. Doing that can lead to hyponatremia, which is having too little salt (sodium) in the body. Water from ice chips you suck on. Diluted fruit juice. This is fruit juice that you have added water to. Low-calorie sports drinks. Eat foods that contain a healthy balance of electrolytes, such as bananas, oranges, potatoes, tomatoes, and spinach. Do not drink alcohol. Avoid the following: Drinks that contain a lot of sugar. These   include high-calorie sports drinks, fruit juice that is not diluted, and soda. Caffeine. Foods that are greasy or contain a lot of fat or sugar. General instructions Take over-the-counter and prescription medicines only as told by your health care provider. Do not take sodium tablets. Doing that can lead to having too much sodium in the body (hypernatremia). Return to your normal activities as told by your health care provider. Ask your health care provider what activities are safe for you. Keep all follow-up visits. Your health care provider may need to  check your progress and suggest new ways to treat your condition. Contact a health care provider if: You have muscle cramps, pain, or discomfort, such as: Pain in your abdomen and the pain gets worse or stays in one area. Stiff neck. You have a rash. You are more irritable than usual. You are sleepier or have a harder time waking. You feel weak or dizzy. You feel very thirsty. Get help right away if: You have symptoms of severe dehydration. You vomit every time you eat or drink. Your vomiting gets worse, does not go away, or includes blood or green matter (bile). You are getting treatment but symptoms are getting worse. You have a fever. You have a severe headache. You have: Diarrhea that gets worse or does not go away. Blood in your stool. This may cause stool to look black and tarry. Not urinating, or urinating only a small amount of very dark urine, within 6-8 hours. You have trouble breathing. These symptoms may be an emergency. Get help right away. Do not wait to see if the symptoms will go away. Do not drive yourself to the hospital. Call 911. This information is not intended to replace advice given to you by your health care provider. Make sure you discuss any questions you have with your health care provider. Document Revised: 11/17/2021 Document Reviewed: 11/17/2021 Elsevier Patient Education  2024 [employer]  

## 2022-11-04 NOTE — Addendum Note (Signed)
Addended by: Charma Igo on: 11/04/2022 03:20 PM   Modules accepted: Orders

## 2022-11-06 ENCOUNTER — Other Ambulatory Visit (HOSPITAL_COMMUNITY): Payer: Self-pay

## 2022-11-06 LAB — T4: T4, Total: 6.6 ug/dL (ref 4.5–12.0)

## 2022-11-08 ENCOUNTER — Other Ambulatory Visit: Payer: Self-pay | Admitting: Internal Medicine

## 2022-11-09 ENCOUNTER — Encounter: Payer: Self-pay | Admitting: Internal Medicine

## 2022-11-11 ENCOUNTER — Encounter: Payer: Self-pay | Admitting: Medical Oncology

## 2022-11-12 ENCOUNTER — Other Ambulatory Visit: Payer: Self-pay | Admitting: Internal Medicine

## 2022-11-12 DIAGNOSIS — C649 Malignant neoplasm of unspecified kidney, except renal pelvis: Secondary | ICD-10-CM

## 2022-11-13 ENCOUNTER — Telehealth: Payer: Self-pay

## 2022-11-13 NOTE — Telephone Encounter (Signed)
Notified Patient of denial of prior authorization request for Levothyroxine 50 mcg tablets.Patient does not have Part D prescription drug coverage, and the Medicare Advantage plan's drug coverage is limited to only the drugs covered under Part B medical benefits.. No other needs or concerns voiced at this time.

## 2022-11-15 IMAGING — CT CT ABD-PELV W/ CM
2 of 5 series · 15 of 46 positions shown, 17 images · IV contrast (Omnipaque)
Comparison: Pelvis radiograph 07/09/2003.

CLINICAL DATA: Abdominal pain, postop right upper quadrant
abdominal mass. Hematuria.

EXAM:
CT ABDOMEN AND PELVIS WITH CONTRAST
TECHNIQUE: Multidetector CT imaging of the abdomen and pelvis was performed
using the standard protocol following bolus administration of
intravenous contrast.

[Series 3: axial st · axial · 0.82mm/px · z∈[+574,+1024]mm · 12 of 101 slices shown, 14 images]
[im 6/101  soft-tissue]
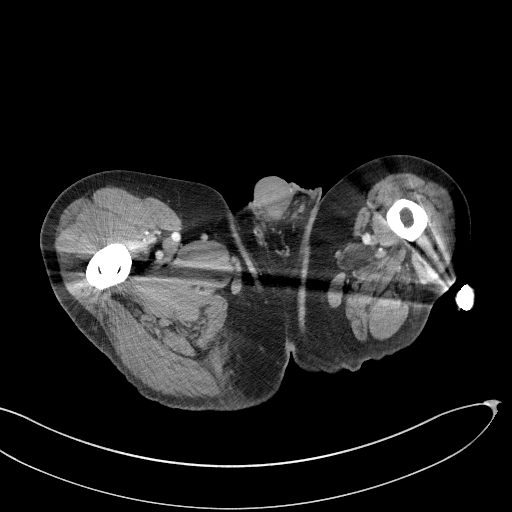
[im 6/101  bone]
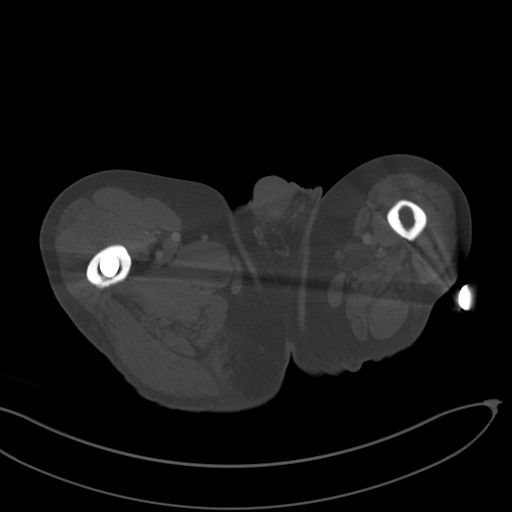
[im 16/101  soft-tissue]
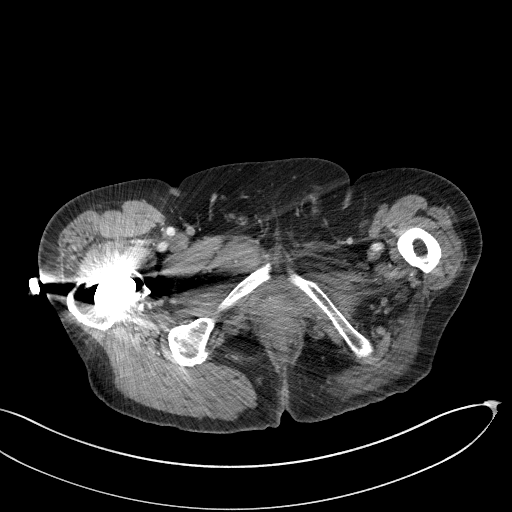
[im 21/101  soft-tissue]
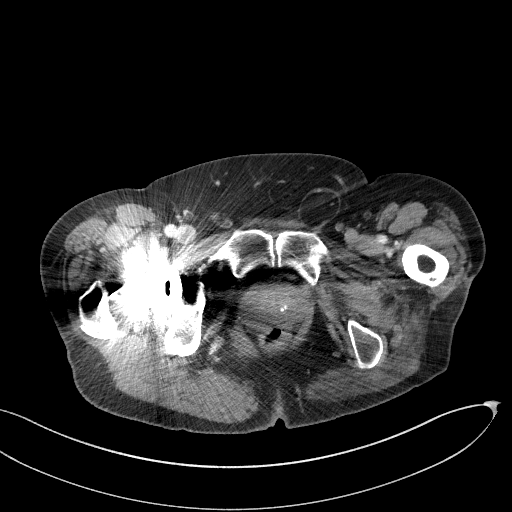
[im 31/101  soft-tissue]
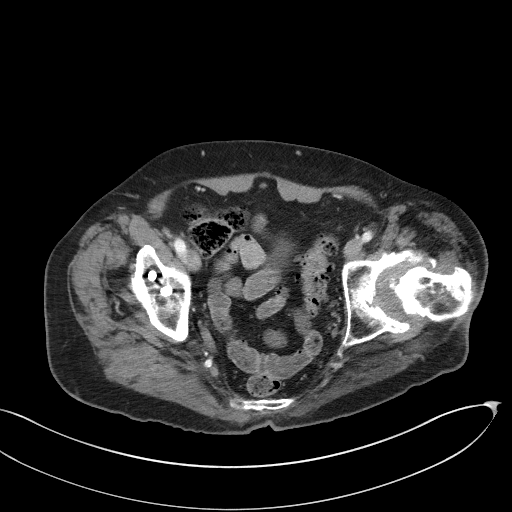
[im 41/101  soft-tissue]
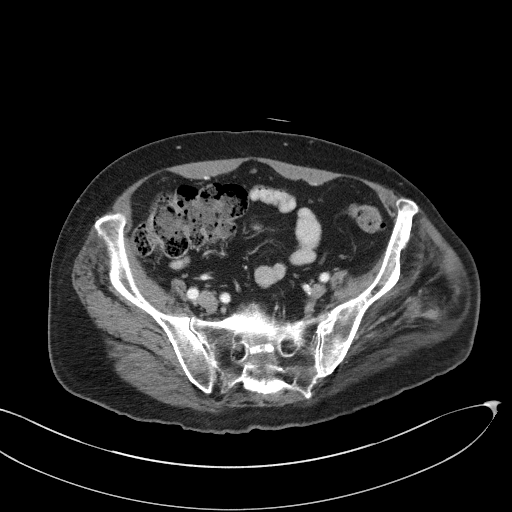
[im 46/101  soft-tissue]
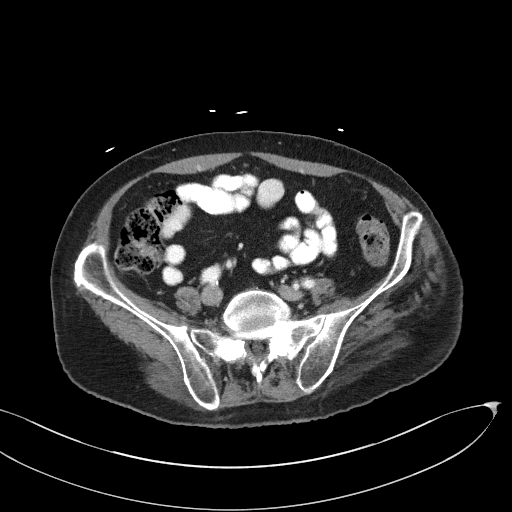
[im 56/101  soft-tissue]
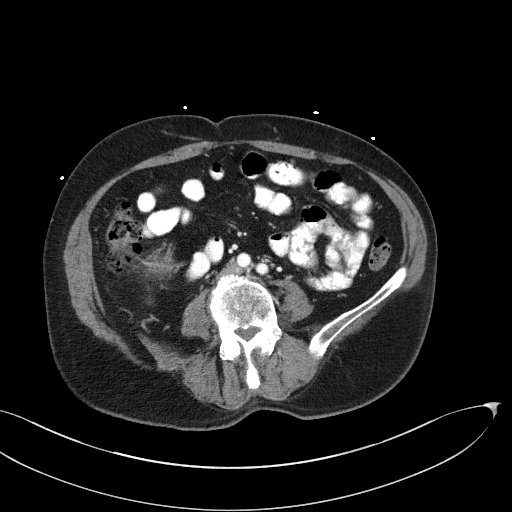
[im 61/101  soft-tissue]
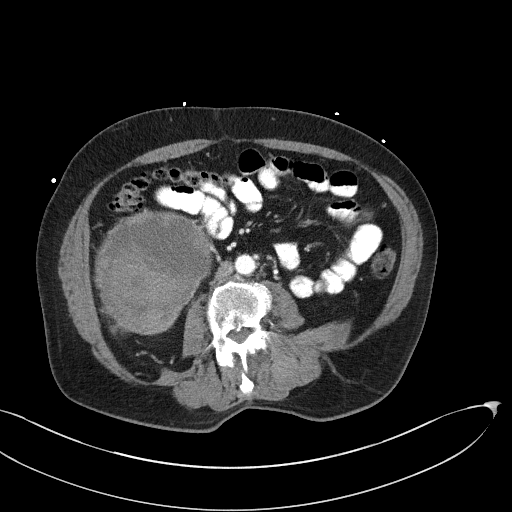
[im 71/101  soft-tissue]
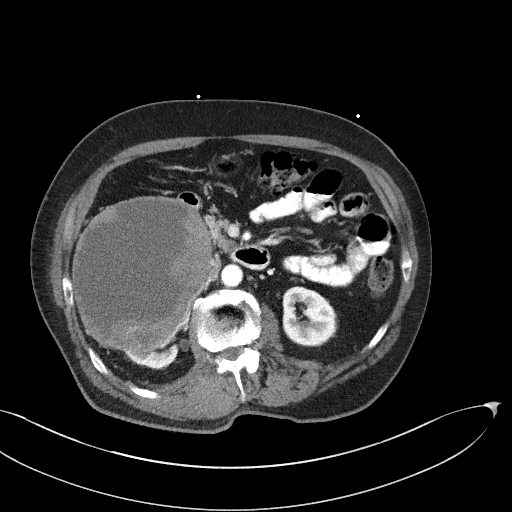
[im 71/101  bone]
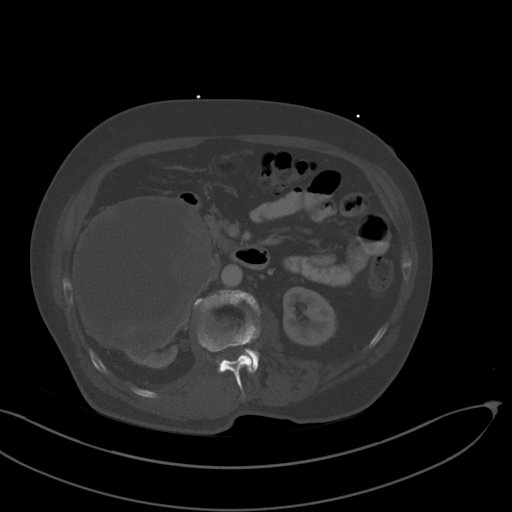
[im 81/101  soft-tissue]
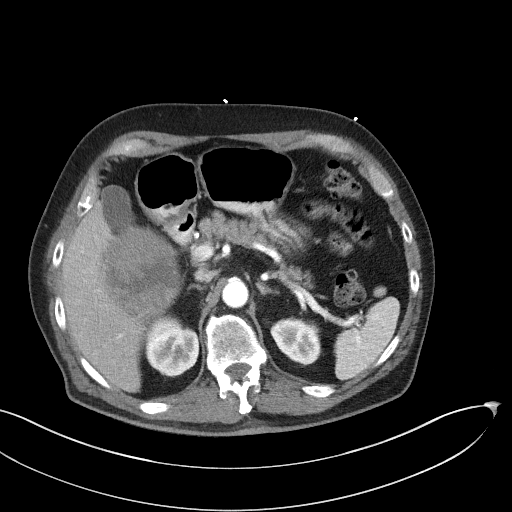
[im 86/101  soft-tissue]
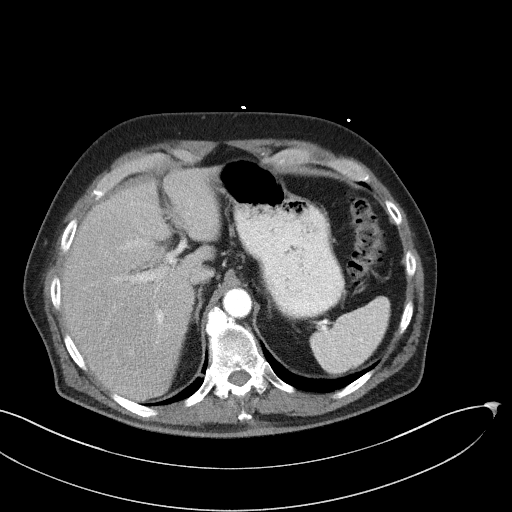
[im 96/101  soft-tissue]
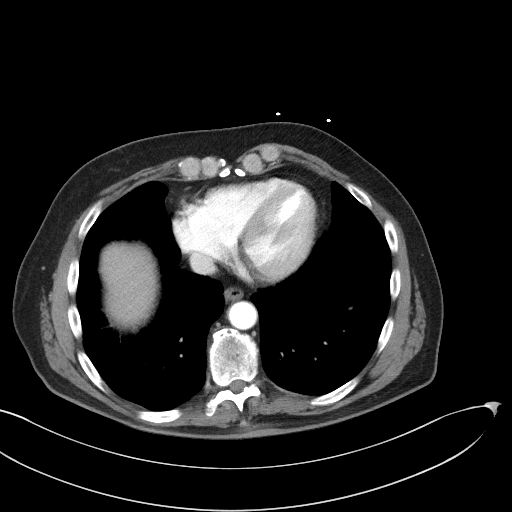

[Series 6: coronal st · coronal · 0.80mm/px · 3 of 92 slices shown]
[im 31/92  soft-tissue]
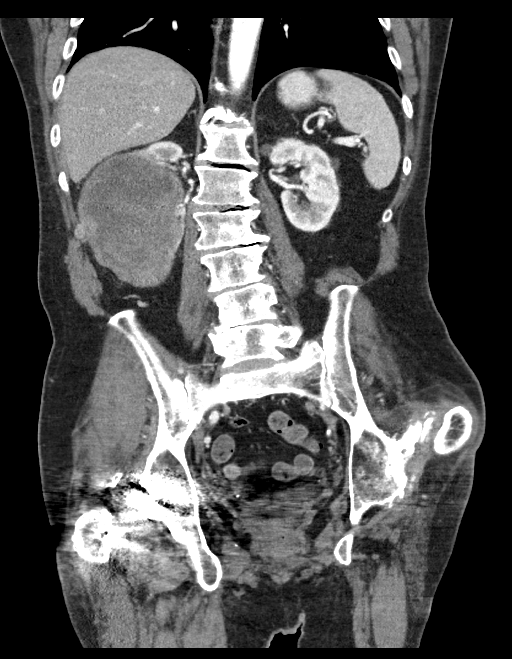
[im 41/92  soft-tissue]
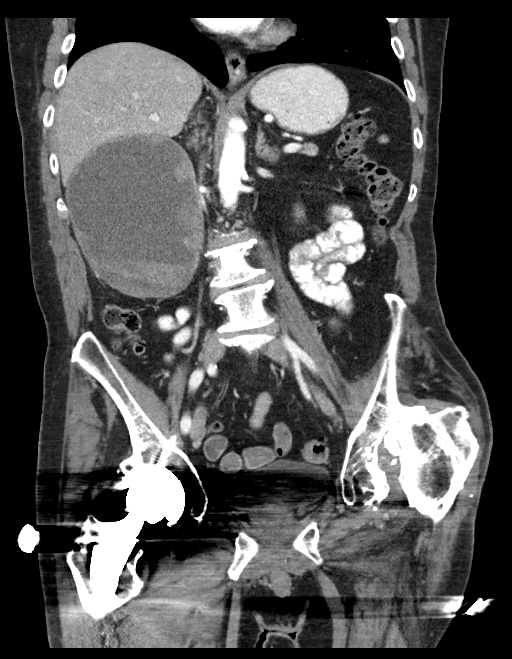
[im 51/92  soft-tissue]
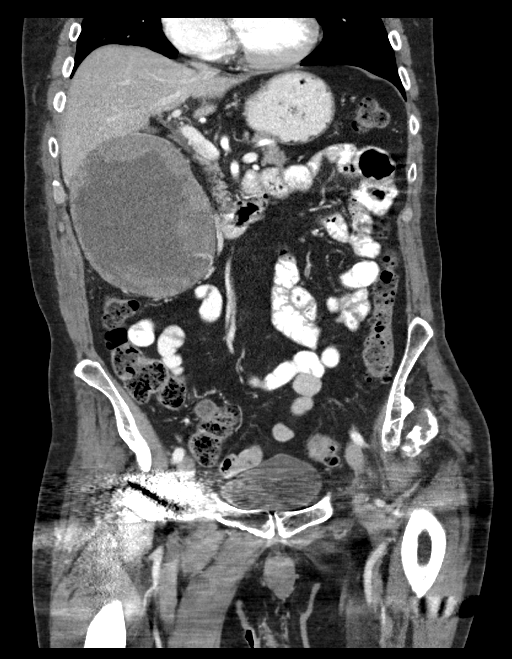

[15 of 46 positions shown; findings below may reference images not displayed]

RADIATION DOSE REDUCTION: This exam was performed according to the
departmental dose-optimization program which includes automated
exposure control, adjustment of the mA and/or kV according to
patient size and/or use of iterative reconstruction technique.

CONTRAST:  100mL OMNIPAQUE IOHEXOL 300 MG/ML  SOLN
FINDINGS: Lower chest: 4 mm posteromedial right lower lobe nodule ([DATE]).
Bibasilar scarring. No pleural fluid. Heart is at the upper limits
of normal in size. No pericardial or pleural effusion. Distal
esophagus is unremarkable.

Hepatobiliary: Liver is decreased in attenuation diffusely. Liver
and gallbladder are otherwise unremarkable. No biliary ductal
dilatation.

Pancreas: Negative.

Spleen: Negative.

Adrenals/Urinary Tract: Adrenal glands are unremarkable. Cystic mass
off the anterior interpolar right kidney has extensive nodular and
masslike peripheral wall thickening, overall measuring 11.2 x
cm. Low-attenuation lesions off the upper pole left kidney measure
up to 9 mm and are likely cysts. No follow-up necessary. Ureters are
decompressed. Bladder is largely obscured by streak artifact from a
right hip arthroplasty.

Stomach/Bowel: Stomach, small bowel and appendix are unremarkable.
Fair amount of stool is seen in the colon, indicative of
constipation.

Vascular/Lymphatic: Atherosclerotic calcification of the aorta. IVC
is compressed by the right renal mass. No pathologically enlarged
lymph nodes.

Reproductive: Prostate is enlarged.

Other: Left inguinal hernia contains fat. No free fluid. Mesenteries
and peritoneum are otherwise unremarkable.

Musculoskeletal: Right hip arthroplasty. Destruction of the left
femoral head with lateral subluxation and advanced secondary
osteoarthritis in the left hip, as on 07/09/2003. Degenerative
changes in the spine. No worrisome lytic or sclerotic lesions.
IMPRESSION: 1. Large complex right renal mass, most compatible with renal cell
carcinoma.
2. 4 mm posteromedial right lower lobe nodule. Recommend attention
on follow-up as metastatic disease cannot be definitively excluded.
3. Hepatic steatosis.
4. Enlarged prostate.
5.  Aortic atherosclerosis ([id number]-[id number]).

## 2022-11-16 IMAGING — CR DG CHEST 2V
3 series · 3 of 3 positions shown · non-contrast
Comparison: None Available.

CLINICAL DATA: History of renal mass.

EXAM:
CHEST - 2 VIEW

[w chest pa]
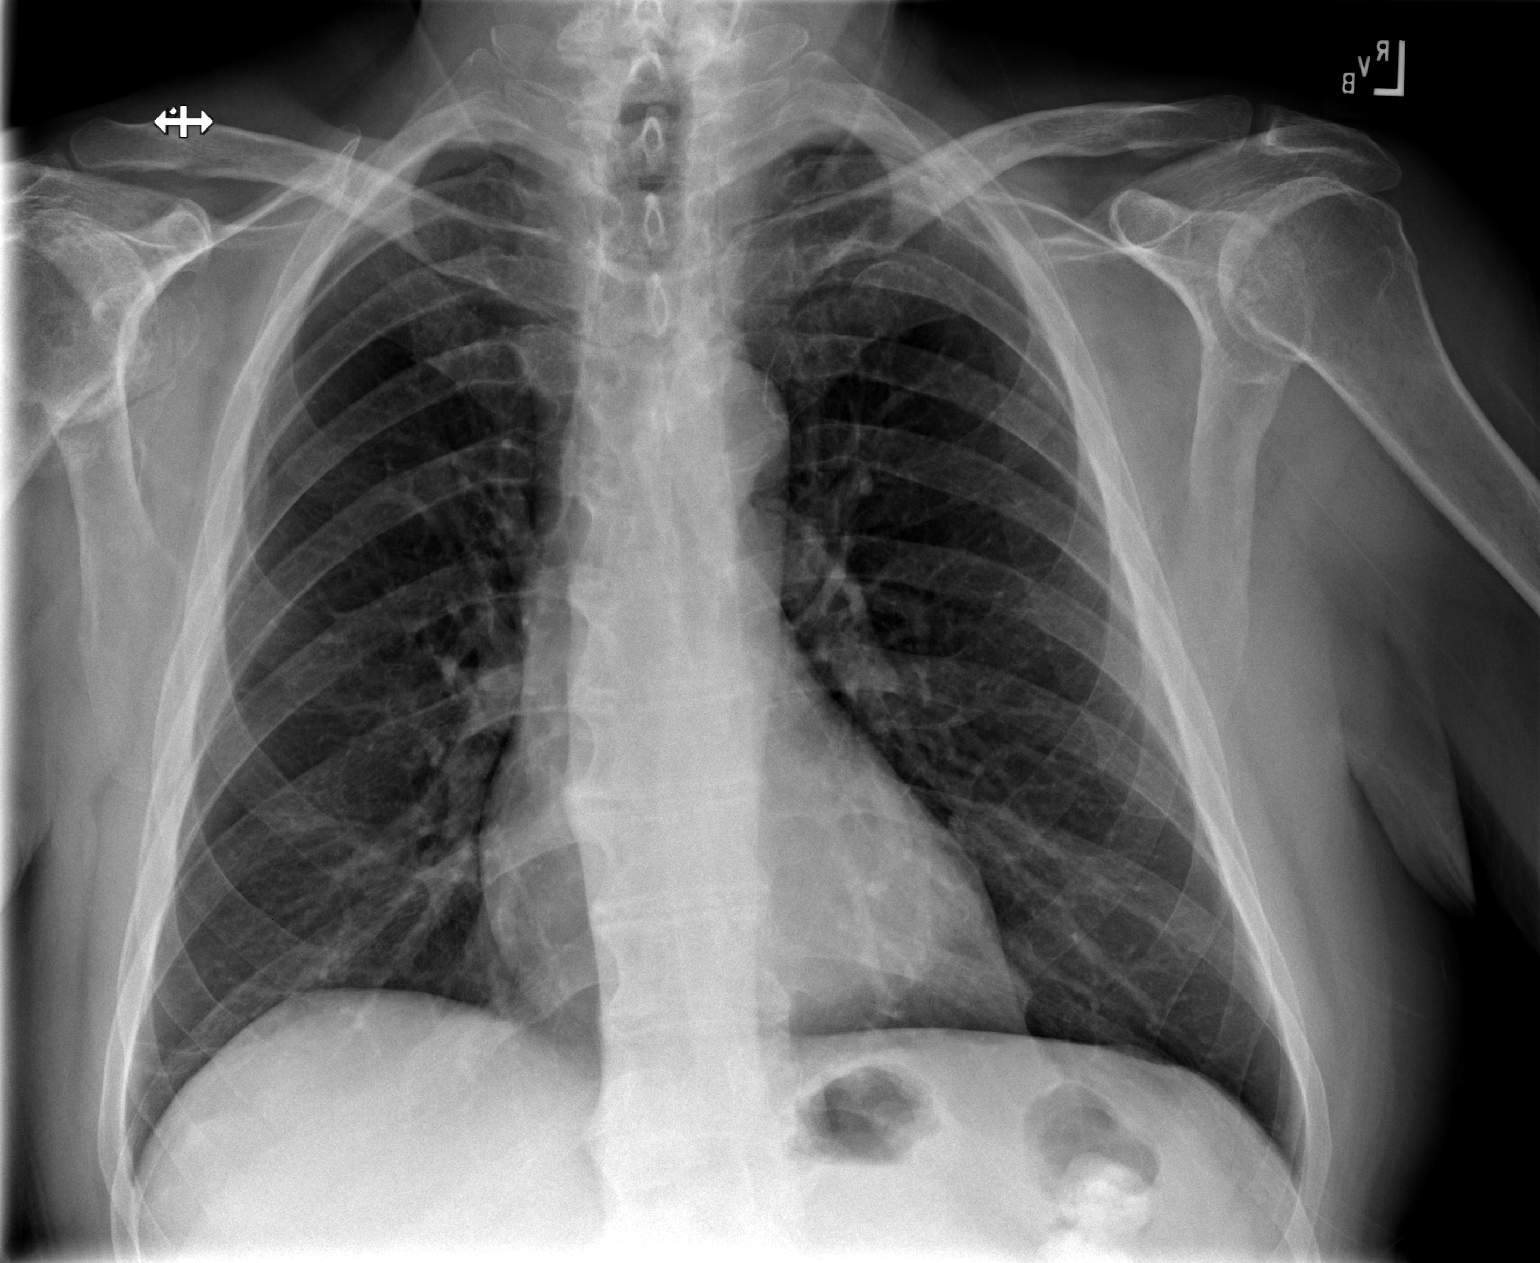

[w chest lat (1 of 2)]
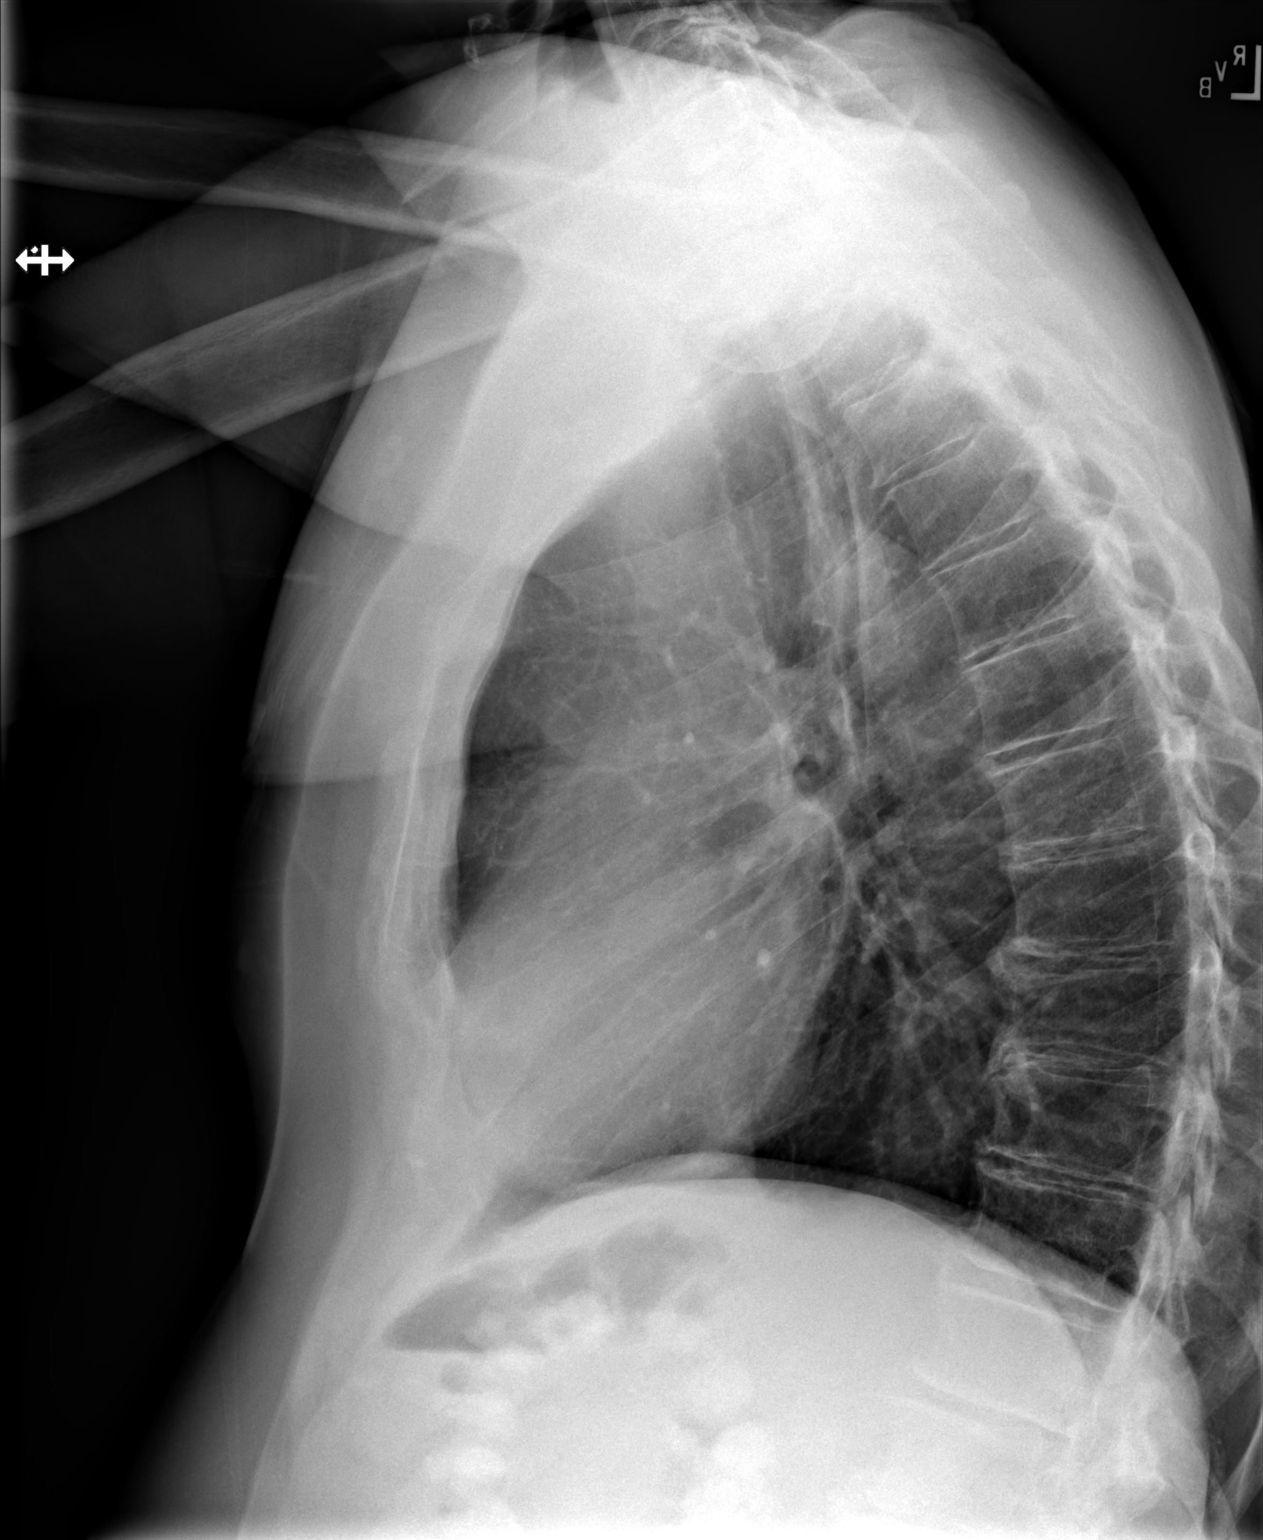

[w chest lat (2 of 2)]
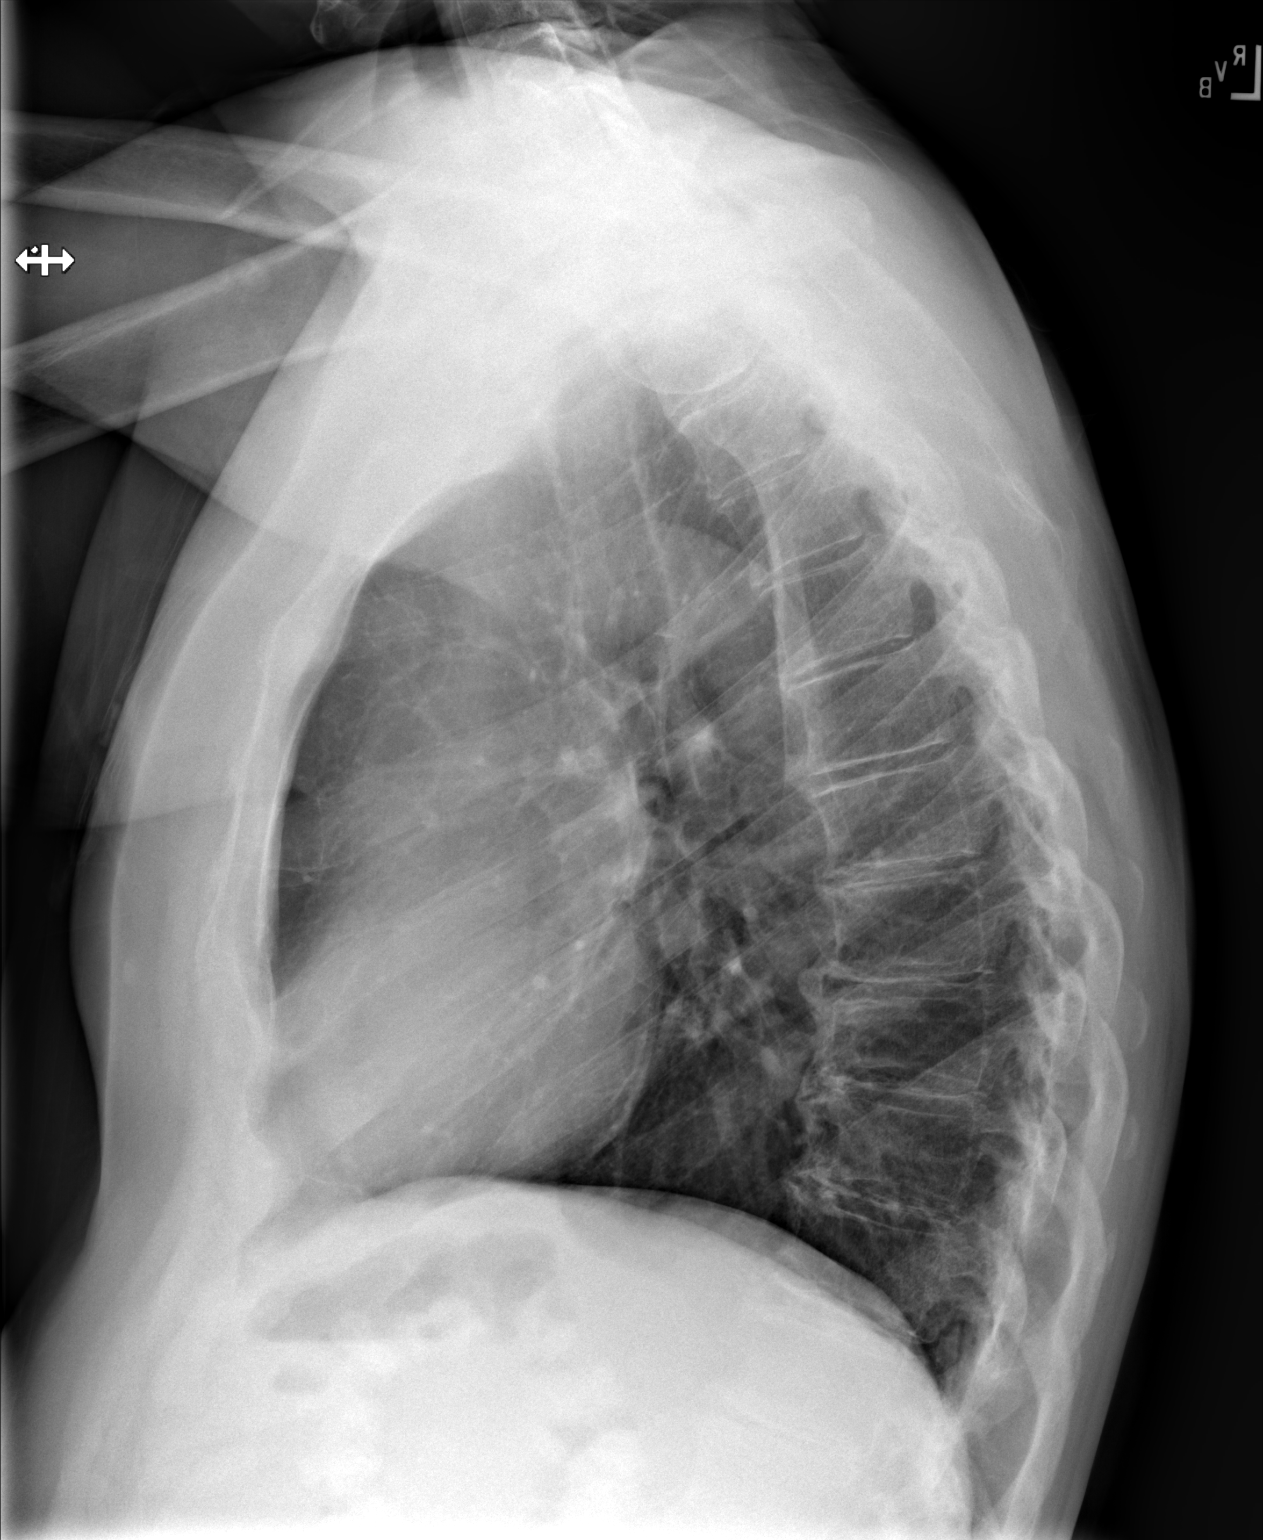

[3 of 3 positions shown; findings below may reference images not displayed]

FINDINGS: The heart size and mediastinal contours are within normal limits.
Both lungs are clear. The visualized skeletal structures are
unremarkable.
IMPRESSION: No active cardiopulmonary disease.

## 2022-11-19 ENCOUNTER — Other Ambulatory Visit: Payer: Self-pay

## 2022-11-29 NOTE — Progress Notes (Unsigned)
[facility] OFFICE PROGRESS NOTE  [name], MD 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   PRIOR THERAPY:  1) Status post right open radical nephrectomy completed on October 15, 2021 by Dr. Jettie Pagan.  2) Keytruda 200 Mg IV every 3 weeks started December 03, 2021.  Status post 9 cycles.  Last dose was given May 21, 2022.  This was discontinued secondary to disease progression  CURRENT THERAPY: Systemic treatment with nivolumab 480 Mg IV every 4 weeks in addition to Cabometyx 40 mg p.o. daily.  First dose June 18, 2022.  Status post 6 cycles. His Cabometyx has been on hold recently due to adverse side effects. His dose was reduced in May to 20 mg p.o. daily but he continues to have weakness, diarrhea, and dehydration. This has been on hold since 5/19.   INTERVAL HISTORY: Micheal Mcintyre 82 y.o. male returns to the clinic today for a follow up visit accompanied by his wife and daughter. He is followed for his metastatic renal cell carcinoma. He is currently undergoing treatment with nivolumab. He was also on cabometyx. He has been having a hard time with diarrhea, dehydration, weight loss and weakness. This has been on hold since May 2024 and he has been feeling better.   Still taking magnesium? Sometimes he will call the clinic when he feels dehydrated and needs IVF. He did not feel like he needed IVF in the interval since last being seen.   The patient was last seen by Dr. Arbutus Ped on 11/04/22. He had a restaging CT scan at that time which showed mild improvement in the pulmonary metastasis but slight increase in 2 of the liver lesions. Dr. Arbutus Ped recommended close monitoring. If needed, we may consider resuming cabometyx.   Dr. Arbutus Ped recommended he continue on  nivolumab  Appetite? Previously was poor due to taste alterations. Stopped marinol due to too much drowsiness. He has previously seen a member of the nutritionist team. He does not like the taste of boost/ensure. He is currently on remeron and his appetite is ***  Since last been seen, he denies fevers, chills, or night sweats.  He denies chest pain, shortness of breath, or hemoptysis. He may have mild cough at night but states it does not last.  N/V????   He is here for evaluation and repeat blood work before starting cycle #7 of nivolumab.   MEDICAL HISTORY: Past Medical History:  Diagnosis Date   Arthritis    GERD (gastroesophageal reflux disease)    Hypertension    Medicare annual wellness visit, subsequent 08/19/2014   Osteoarthritis 09/21/2007   Qualifier: Diagnosis of  By: Alphonzo Severance MD, Ivar Drape R    PONV (postoperative nausea and vomiting)    Preventative health care 06/07/2016   Right shoulder pain 12/03/2016    ALLERGIES:  is allergic to statins.  MEDICATIONS:  Current Outpatient Medications  Medication Sig Dispense Refill   acetaminophen (TYLENOL) 500 MG tablet Take 1,000 mg by mouth every 6 (six) hours as needed for moderate pain.     aluminum hydroxide-magnesium carbonate (GAVISCON) 95-358 MG/15ML SUSP Take 15 mLs by mouth as needed for heartburn or indigestion. (Patient not taking: Reported on 09/09/2022)     cabozantinib (CABOMETYX) 20 MG tablet Take 1 tablet (20 mg total) by  mouth daily. Take on an empty stomach, 1 hour before or 2 hours after meals. 30 tablet 3   Cholecalciferol (VITAMIN D3) 10 MCG (400 UNIT) tablet Take 800 Units by mouth daily.     diclofenac Sodium (VOLTAREN) 1 % GEL Apply 1 application. topically 4 (four) times daily as needed (pain).     diphenoxylate-atropine (LOMOTIL) 2.5-0.025 MG tablet Take 1 tablet by mouth 4 (four) times daily as needed for diarrhea or loose stools. 30 tablet 0   levothyroxine (SYNTHROID) 50 MCG tablet TAKE 1 TABLET(50 MCG)  BY MOUTH DAILY BEFORE BREAKFAST 30 tablet 1   magnesium chloride (SLOW-MAG) 64 MG TBEC SR tablet Take 1 tablet (64 mg total) by mouth 2 (two) times daily. 60 tablet 0   Multiple Vitamin (MULTIVITAMIN WITH MINERALS) TABS tablet Take 1 tablet by mouth 4 (four) times a week.     mupirocin ointment (BACTROBAN) 2 % 1 app applied topically 3 times a day for 5 day(s)     Omega-3 350 MG CPDR Take 350 mg by mouth daily.     ondansetron (ZOFRAN) 8 MG tablet Take 1 tablet (8 mg total) by mouth every 8 (eight) hours as needed for nausea or vomiting. 20 tablet 3   potassium chloride SA (KLOR-CON M) 20 MEQ tablet Take 1 tablet (20 mEq total) by mouth daily. 3 tablet 0   prochlorperazine (COMPAZINE) 10 MG tablet TAKE 1 TABLET(10 MG) BY MOUTH EVERY 6 HOURS AS NEEDED FOR NAUSEA OR VOMITING 30 tablet 0   rosuvastatin (CRESTOR) 20 MG tablet Take 0.5 tablets (10 mg total) by mouth daily. 90 tablet 3   telmisartan (MICARDIS) 80 MG tablet Take 0.5 tablets (40 mg total) by mouth 2 (two) times daily. 180 tablet 1   TURMERIC PO Take 1 tablet by mouth 3 (three) times a week.     No current facility-administered medications for this visit.    SURGICAL HISTORY:  Past Surgical History:  Procedure Laterality Date   CHOLECYSTECTOMY     HIP SURGERY     JOINT REPLACEMENT     NEPHRECTOMY Right 10/15/2021   Procedure: NEPHRECTOMY, OPEN;  Surgeon: Jannifer Hick, MD;  Location: [facility];  Service: Urology;  Laterality: Right;   nose skin cancer removal     TOTAL HIP ARTHROPLASTY      REVIEW OF SYSTEMS:   Review of Systems  Constitutional: Negative for appetite change, chills, fatigue, fever and unexpected weight change.  HENT:   Negative for mouth sores, nosebleeds, sore throat and trouble swallowing.   Eyes: Negative for eye problems and icterus.  Respiratory: Negative for cough, hemoptysis, shortness of breath and wheezing.   Cardiovascular: Negative for chest pain and leg swelling.  Gastrointestinal: Negative for  abdominal pain, constipation, diarrhea, nausea and vomiting.  Genitourinary: Negative for bladder incontinence, difficulty urinating, dysuria, frequency and hematuria.   Musculoskeletal: Negative for back pain, gait problem, neck pain and neck stiffness.  Skin: Negative for itching and rash.  Neurological: Negative for dizziness, extremity weakness, gait problem, headaches, light-headedness and seizures.  Hematological: Negative for adenopathy. Does not bruise/bleed easily.  Psychiatric/Behavioral: Negative for confusion, depression and sleep disturbance. The patient is not nervous/anxious.     PHYSICAL EXAMINATION:  There were no vitals taken for this visit.  ECOG PERFORMANCE STATUS: {CHL ONC ECOG [id number]  Physical Exam  Constitutional: Oriented to person, place, and time and well-developed, well-nourished, and in no distress. No distress.  HENT:  Head: Normocephalic and atraumatic.  Mouth/Throat: Oropharynx is clear and  moist. No oropharyngeal exudate.  Eyes: Conjunctivae are normal. Right eye exhibits no discharge. Left eye exhibits no discharge. No scleral icterus.  Neck: Normal range of motion. Neck supple.  Cardiovascular: Normal rate, regular rhythm, normal heart sounds and intact distal pulses.   Pulmonary/Chest: Effort normal and breath sounds normal. No respiratory distress. No wheezes. No rales.  Abdominal: Soft. Bowel sounds are normal. Exhibits no distension and no mass. There is no tenderness.  Musculoskeletal: Normal range of motion. Exhibits no edema.  Lymphadenopathy:    No cervical adenopathy.  Neurological: Alert and oriented to person, place, and time. Exhibits normal muscle tone. Gait normal. Coordination normal.  Skin: Skin is warm and dry. No rash noted. Not diaphoretic. No erythema. No pallor.  Psychiatric: Mood, memory and judgment normal.  Vitals reviewed.  LABORATORY DATA: Lab Results  Component Value Date   WBC 4.3 11/04/2022   HGB 10.0 (L)  11/04/2022   HCT 29.8 (L) 11/04/2022   MCV 109.2 (H) 11/04/2022   PLT 237 11/04/2022      Chemistry      Component Value Date/Time   NA 141 11/04/2022 1255   NA 140 04/22/2022 0000   K 4.1 11/04/2022 1255   CL 107 11/04/2022 1255   CO2 27 11/04/2022 1255   BUN 10 11/04/2022 1255   BUN 21 04/22/2022 0000   CREATININE 0.90 11/04/2022 1255   CREATININE 1.09 02/06/2020 1306   GLU 88 04/22/2022 0000      Component Value Date/Time   CALCIUM 8.9 11/04/2022 1255   ALKPHOS 87 11/04/2022 1255   AST 16 11/04/2022 1255   ALT 14 11/04/2022 1255   BILITOT 0.5 11/04/2022 1255       RADIOGRAPHIC STUDIES:  CT CHEST ABDOMEN PELVIS WO CONTRAST  Result Date: 11/02/2022 CLINICAL DATA:  Metastatic renal cell carcinoma. Evaluate treatment response. Right chemotherapy. Ongoing chemotherapy. Asymptomatic. * Tracking Code: BO * EXAM: CT CHEST, ABDOMEN AND PELVIS WITHOUT CONTRAST TECHNIQUE: Multidetector CT imaging of the chest, abdomen and pelvis was performed following the standard protocol without IV contrast. RADIATION DOSE REDUCTION: This exam was performed according to the departmental dose-optimization program which includes automated exposure control, adjustment of the mA and/or kV according to patient size and/or use of iterative reconstruction technique. COMPARISON:  09/07/2022 FINDINGS: CT CHEST FINDINGS Cardiovascular: Aortic atherosclerosis. Normal heart size, without pericardial effusion. Mediastinum/Nodes: No mediastinal or hilar adenopathy. Lungs/Pleura: No pleural fluid. Bilateral pulmonary nodules. Index 5 mm nodule on 106/6 is similar to on the prior. Index lingular nodule measures 4 mm on 98/6 versus 5 mm on the prior. Index left lower lobe pulmonary nodule with minimal central cavitation measures 8 mm on 126/6 versus 10 mm previously. New areas of focal ground-glass and septal thickening including within the posterolateral left upper lobe at 2.6 cm on 77/6, anterior left upper lobe at 4.9  cm on 89/6, and superior segment left lower lobe at 8 mm on 63/6. Musculoskeletal: No acute osseous abnormality. CT ABDOMEN PELVIS FINDINGS Hepatobiliary: Low-density liver lesions indicative of known metastasis. Index high right hepatic lobe 2.5 x 2.7 cm lesion on 49/2 measured 2.3 x 2.4 Cm on the prior. Index segment 5 lesion measures 1.8 x 1.8 cm on 60/2 versus 1.9 x 1.6 cm on the prior. Segment 6 subcapsular lesion measures 1.1 cm on 54/2 and is similar to on the prior (when remeasured). Suspect gallbladder sludge or stones in the fundus on 61/2. No acute cholecystitis or biliary duct dilatation. Pancreas: Pancreatic atrophy. No duct dilatation or  acute inflammation. Spleen: Normal in size, without focal abnormality. Adrenals/Urinary Tract: Normal adrenal glands. Right nephrectomy without local recurrence. No left renal calculi or hydronephrosis. Subcentimeter upper and interpolar left renal lesions are low-density and likely cysts . In the absence of clinically indicated signs/symptoms require(s) no independent follow-up. No bladder calculi. Stomach/Bowel: Normal stomach, without wall thickening. Normal colon and terminal ileum. Normal small bowel. Vascular/Lymphatic: Aortic atherosclerosis. No abdominopelvic adenopathy. Reproductive: Mild prostatomegaly. Other: No significant free fluid. Musculoskeletal: Right hip arthroplasty with resultant mild beam hardening artifact in the pelvis. Advanced left hip osteoarthritis, secondary to chronic developmental dysplasia, dislocation, and pseudoarthrosis. Advanced lumbosacral spondylosis. Convex right thoracolumbar spine curvature. IMPRESSION: CT CHEST IMPRESSION 1. Minimal improvement in pulmonary metastasis. 2. Areas of left upper and less so lower lobe ground-glass or new. Considerations include atypical infection or drug toxicity. Radiation change could have this appearance, but no such history is identified in the EMR. 3. No thoracic adenopathy. 4.  Aortic  Atherosclerosis (ICD10-I70.0). CT ABDOMEN AND PELVIS IMPRESSION 1. Hepatic metastasis, with minimal progression of a hepatic dome lesion. 2. No local recurrence after right nephrectomy. Electronically Signed   By: Jeronimo Greaves M.D.   On: 11/02/2022 14:59     ASSESSMENT/PLAN:  This is a very pleasant 82 year old Caucasian male diagnosed with metastatic papillary renal cell carcinoma.  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  This was diagnosed in June 2023.  He is status post a right open radical nephrectomy.  Patient was found to have metastatic disease to the liver and bone in February 2024.   The patient had previously underwent adjuvant treatment with Keytruda 20 mg IV every 3 weeks for 9 cycles.   When he saw Dr. Arbutus Ped on 06/11/2022 and reviewed his MRI with him which showed multiple new rim-enhancing lesions throughout the liver consistent metastatic disease.  There was also a rim-enhancing lesion in the inferior endplate of L1 measuring 2.1 x 1.1 cm concerning for metastatic disease.  There is no local recurrence or lymphadenopathy. He also had a chest CT by Dr. Cardell Peach which showed pulmonary metastatic disease.   Dr. Arbutus Ped recommended that the patient start treatment with nivolumab 480 mg IV every 4 weeks in addition to Cabometyx 40 mg grams p.o. daily.  He started this on 06/18/2022. He is status post 6 cycles of nivolumab.  He has been having intolerance to cabometyx. His treatment has periodically been on hold. His dose was reduced to 20 mg in May 2024, but he continued to have intolerance. Therefore, this has been on hold since 5/19. His last CT scan showed mild improvement in the pulmonary metastasis but slight increase in 2 of the liver lesions. Dr. Arbutus Ped recommended close monitoring. If needed, we may consider resuming cabometyx.   Labs were reviewed. Recommend ***  Magnesium ???  IVF imodim, lomotil, remeron.    We will see him back in 4 weeks for evaluation and repeat blood work before starting cycle #8.   The patient was advised to call immediately if he has any concerning symptoms in the interval. The patient voices understanding of current disease status and treatment options and is in agreement with the current care plan. All questions were answered. The patient knows to call the clinic with any problems, questions or concerns. We can certainly see the patient much sooner if necessary     No orders of the defined types were placed in this encounter.  I spent {CHL ONC TIME VISIT - KZSWF:[id number]} counseling the patient face to face. The total time spent in the appointment was {CHL ONC TIME VISIT - KGURK:[id number]}.  Kya Mayfield L Amyre Segundo, PA-C 11/29/22

## 2022-12-02 ENCOUNTER — Other Ambulatory Visit (HOSPITAL_COMMUNITY): Payer: Self-pay

## 2022-12-02 ENCOUNTER — Inpatient Hospital Stay: Payer: Medicare Other

## 2022-12-02 ENCOUNTER — Other Ambulatory Visit: Payer: Self-pay

## 2022-12-02 ENCOUNTER — Inpatient Hospital Stay (HOSPITAL_BASED_OUTPATIENT_CLINIC_OR_DEPARTMENT_OTHER): Payer: Medicare Other | Admitting: Physician Assistant

## 2022-12-02 VITALS — BP 121/74 | HR 73 | Temp 98.1°F | Resp 18 | Wt 144.9 lb

## 2022-12-02 VITALS — BP 120/65 | HR 70 | Resp 18

## 2022-12-02 DIAGNOSIS — C649 Malignant neoplasm of unspecified kidney, except renal pelvis: Secondary | ICD-10-CM

## 2022-12-02 DIAGNOSIS — Z5112 Encounter for antineoplastic immunotherapy: Secondary | ICD-10-CM

## 2022-12-02 LAB — CMP (CANCER CENTER ONLY)
ALT: 13 U/L (ref 0–44)
AST: 13 U/L — ABNORMAL LOW (ref 15–41)
Albumin: 3.5 g/dL (ref 3.5–5.0)
Alkaline Phosphatase: 87 U/L (ref 38–126)
Anion gap: 6 (ref 5–15)
BUN: 17 mg/dL (ref 8–23)
CO2: 26 mmol/L (ref 22–32)
Calcium: 9 mg/dL (ref 8.9–10.3)
Chloride: 109 mmol/L (ref 98–111)
Creatinine: 1.08 mg/dL (ref 0.61–1.24)
GFR, Estimated: >60 mL/min (ref >60)
Glucose, Bld: 84 mg/dL (ref 70–99)
Potassium: 4.1 mmol/L (ref 3.5–5.1)
Sodium: 141 mmol/L (ref 135–145)
Total Bilirubin: 0.5 mg/dL (ref 0.3–1.2)
Total Protein: 5.8 g/dL — ABNORMAL LOW (ref 6.5–8.1)

## 2022-12-02 LAB — TSH: TSH: 2.899 u[IU]/mL (ref 0.350–4.500)

## 2022-12-02 LAB — CBC WITH DIFFERENTIAL (CANCER CENTER ONLY)
Abs Immature Granulocytes: 0.01 10*3/uL (ref 0.00–0.07)
Basophils Absolute: 0 10*3/uL (ref 0.0–0.1)
Basophils Relative: 1 %
Eosinophils Absolute: 0.1 10*3/uL (ref 0.0–0.5)
Eosinophils Relative: 2 %
HCT: 32.2 % — ABNORMAL LOW (ref 39.0–52.0)
Hemoglobin: 10.6 g/dL — ABNORMAL LOW (ref 13.0–17.0)
Immature Granulocytes: 0 %
Lymphocytes Relative: 16 %
Lymphs Abs: 0.7 10*3/uL (ref 0.7–4.0)
MCH: 35.8 pg — ABNORMAL HIGH (ref 26.0–34.0)
MCHC: 32.9 g/dL (ref 30.0–36.0)
MCV: 108.8 fL — ABNORMAL HIGH (ref 80.0–100.0)
Monocytes Absolute: 0.5 10*3/uL (ref 0.1–1.0)
Monocytes Relative: 11 %
Neutro Abs: 3 10*3/uL (ref 1.7–7.7)
Neutrophils Relative %: 70 %
Platelet Count: 185 10*3/uL (ref 150–400)
RBC: 2.96 MIL/uL — ABNORMAL LOW (ref 4.22–5.81)
RDW: 14.1 % (ref 11.5–15.5)
WBC Count: 4.3 10*3/uL (ref 4.0–10.5)
nRBC: 0 % (ref 0.0–0.2)

## 2022-12-02 LAB — MAGNESIUM: Magnesium: 1.8 mg/dL (ref 1.7–2.4)

## 2022-12-02 MED ORDER — SODIUM CHLORIDE 0.9 % IV SOLN: Freq: Once | INTRAVENOUS | Status: AC

## 2022-12-02 MED ORDER — SODIUM CHLORIDE 0.9 % IV SOLN
480.0000 mg | Freq: Once | INTRAVENOUS | Status: AC
  Administered 2022-12-02: 480 mg via INTRAVENOUS
  Filled 2022-12-02: qty 48

## 2022-12-02 NOTE — Progress Notes (Signed)
[facility] OFFICE PROGRESS NOTE   [name], MD 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    PRIOR THERAPY:  1) Status post right open radical nephrectomy completed on October 15, 2021 by Dr. Jettie Pagan.  2) Keytruda 200 Mg IV every 3 weeks started December 03, 2021.  Status post 9 cycles.  Last dose was given May 21, 2022.  This was discontinued secondary to disease progression   CURRENT THERAPY: Systemic treatment with nivolumab 480 Mg IV every 4 weeks in addition to Cabometyx 40 mg p.o. daily.  First dose June 18, 2022.  Status post 6 cycles. His Cabometyx has been on hold recently due to adverse side effects. His dose was reduced in May to 20 mg p.o. daily but he experienced weakness, diarrhea, and dehydration which has completely resolved since holding therapy on 5/19.    INTERVAL HISTORY: Micheal Mcintyre 82 y.o. male returns to the clinic today for a follow up visit accompanied by his wife. He is followed for his metastatic renal cell carcinoma. He is currently undergoing treatment with nivolumab. He was also on cabometyx. He previously had a hard time with diarrhea, fatigue, dehydration, weight loss and weakness. Cabometyx has been on hold since May 2024 and he has been feeling better.    The diarrhea has completely resolved, denies N/V. He states his appetite is back to normal and he has been eating a lot and has gained 10 lbs. He takes OTC magnesium and still drinks Boost Breezes occasionally. Stopped marinol and remeron due to too much drowsiness. He has previously seen a member of the nutritionist team (scheduled to see him at his next appointment). He has finished 6 weeks of physical therapy at his home and he has continued exercises at home daily in the  AM.   Today he is curious about proton radiation therapy.    He will pursue cataract surgery soon. He is wondering if this is ok.   Sometimes he will call the clinic when he feels dehydrated and needs IVF. He did not feel like he needed IVF in the interval since last being seen.    The patient was last seen by Dr. Arbutus Ped on 11/04/22. He had a restaging CT scan at that time which showed mild improvement in the pulmonary metastasis but slight increase in 2 of the liver lesions. Dr. Arbutus Ped recommended close monitoring. If needed, we may consider resuming cabometyx.    Dr. Arbutus Ped recommended he continue on nivolumab.    Since last been seen, he denies fevers, chills, or night sweats.  He denies chest pain, shortness of breath, or hemoptysis. No rashes or skin changes. No N/V.    He is here for evaluation and repeat blood work before starting cycle #7 of nivolumab.          MEDICAL HISTORY:     Past Medical History:  Diagnosis Date   Arthritis     GERD (gastroesophageal reflux disease)     Hypertension     Medicare annual wellness visit, subsequent 08/19/2014   Osteoarthritis 09/21/2007    Qualifier: Diagnosis of  By: Alphonzo Severance MD, Ivar Drape R    PONV (postoperative nausea and vomiting)     Preventative health care 06/07/2016   Right shoulder pain 12/03/2016  ALLERGIES:  is allergic to statins.   MEDICATIONS:        Current Outpatient Medications  Medication Sig Dispense Refill   acetaminophen (TYLENOL) 500 MG tablet Take 1,000 mg by mouth every 6 (six) hours as needed for moderate pain.       aluminum hydroxide-magnesium carbonate (GAVISCON) 95-358 MG/15ML SUSP Take 15 mLs by mouth as needed for heartburn or indigestion. (Patient not taking: Reported on 09/09/2022)       cabozantinib (CABOMETYX) 20 MG tablet Take 1 tablet (20 mg total) by mouth daily. Take on an empty stomach, 1 hour before or 2 hours after meals. 30 tablet 3   Cholecalciferol (VITAMIN D3) 10 MCG (400  UNIT) tablet Take 800 Units by mouth daily.       diclofenac Sodium (VOLTAREN) 1 % GEL Apply 1 application. topically 4 (four) times daily as needed (pain).       diphenoxylate-atropine (LOMOTIL) 2.5-0.025 MG tablet Take 1 tablet by mouth 4 (four) times daily as needed for diarrhea or loose stools. 30 tablet 0   levothyroxine (SYNTHROID) 50 MCG tablet TAKE 1 TABLET(50 MCG) BY MOUTH DAILY BEFORE BREAKFAST 30 tablet 1   magnesium chloride (SLOW-MAG) 64 MG TBEC SR tablet Take 1 tablet (64 mg total) by mouth 2 (two) times daily. 60 tablet 0   Multiple Vitamin (MULTIVITAMIN WITH MINERALS) TABS tablet Take 1 tablet by mouth 4 (four) times a week.       mupirocin ointment (BACTROBAN) 2 % 1 app applied topically 3 times a day for 5 day(s)       Omega-3 350 MG CPDR Take 350 mg by mouth daily.       ondansetron (ZOFRAN) 8 MG tablet Take 1 tablet (8 mg total) by mouth every 8 (eight) hours as needed for nausea or vomiting. 20 tablet 3   potassium chloride SA (KLOR-CON M) 20 MEQ tablet Take 1 tablet (20 mEq total) by mouth daily. 3 tablet 0   prochlorperazine (COMPAZINE) 10 MG tablet TAKE 1 TABLET(10 MG) BY MOUTH EVERY 6 HOURS AS NEEDED FOR NAUSEA OR VOMITING 30 tablet 0   rosuvastatin (CRESTOR) 20 MG tablet Take 0.5 tablets (10 mg total) by mouth daily. 90 tablet 3   telmisartan (MICARDIS) 80 MG tablet Take 0.5 tablets (40 mg total) by mouth 2 (two) times daily. 180 tablet 1   TURMERIC PO Take 1 tablet by mouth 3 (three) times a week.          No current facility-administered medications for this visit.             Facility-Administered Medications Ordered in Other Visits  Medication Dose Route Frequency Provider Last Rate Last Admin   nivolumab (OPDIVO) 480 mg in sodium chloride 0.9 % 100 mL chemo infusion  480 mg Intravenous Once Si Gaul, MD            SURGICAL HISTORY:       Past Surgical History:  Procedure Laterality Date   CHOLECYSTECTOMY       HIP SURGERY       JOINT REPLACEMENT        NEPHRECTOMY Right 10/15/2021    Procedure: NEPHRECTOMY, OPEN;  Surgeon: Jannifer Hick, MD;  Location: [facility];  Service: Urology;  Laterality: Right;   nose skin cancer removal       TOTAL HIP ARTHROPLASTY              REVIEW OF SYSTEMS:   Review of Systems  Constitutional: Negative for  appetite change, chills, fatigue, fever and unexpected weight change (weight gain due to improving appetite).  HENT: Negative for mouth sores, nosebleeds, sore throat and trouble swallowing.   Eyes: Negative for eye problems and icterus.  Respiratory: Negative for cough, hemoptysis, shortness of breath and wheezing.   Cardiovascular: Negative for chest pain and leg swelling.  Gastrointestinal: Negative for abdominal pain, constipation, diarrhea, nausea and vomiting.  Genitourinary: Negative for bladder incontinence, difficulty urinating, dysuria, frequency and hematuria.   Musculoskeletal: Negative for back pain, gait problem, neck pain and neck stiffness.  Skin: Negative for itching and rash.  Neurological: Negative for dizziness, extremity weakness, gait problem, headaches, light-headedness and seizures.  Hematological: Negative for adenopathy. Does not bruise/bleed easily.  Psychiatric/Behavioral: Negative for confusion, depression and sleep disturbance. The patient is not nervous/anxious.       PHYSICAL EXAMINATION:  Blood pressure 121/74, pulse 73, temperature 98.1 F (36.7 C), resp. rate 18, weight 144 lb 14.4 oz (65.7 kg), SpO2 100%.   ECOG PERFORMANCE STATUS: 2   Physical Exam  Constitutional: Oriented to person, place, and time and thin appearing male, and in no distress.  HENT:  Head: Normocephalic and atraumatic.  Mouth/Throat: Oropharynx is clear and moist. No oropharyngeal exudate.  Eyes: Conjunctivae are normal. Right eye exhibits no discharge. Left eye exhibits no discharge. No scleral icterus.  Neck: Normal range of motion. Neck supple.  Cardiovascular: Normal rate, regular  rhythm, normal heart sounds and intact distal pulses.   Pulmonary/Chest: Effort normal and breath sounds normal. No respiratory distress. No wheezes. No rales.  Abdominal: Soft. Bowel sounds are normal. Exhibits no distension and no mass. There is no tenderness.  Musculoskeletal: Normal range of motion. Exhibits no edema.  Lymphadenopathy:    No cervical adenopathy.  Neurological: Alert and oriented to person, place, and time. Exhibits muscle wasting. Walks with a limp with a cane. Skin: Skin is warm and dry. Positive for pallor. No rash noted. Not diaphoretic. No erythema.  Psychiatric: Mood, memory and judgment normal.  Vitals reviewed. LABORATORY DATA: Recent Labs       Lab Results  Component Value Date    WBC 4.3 12/02/2022    HGB 10.6 (L) 12/02/2022    HCT 32.2 (L) 12/02/2022    MCV 108.8 (H) 12/02/2022    PLT 185 12/02/2022          Chemistry    Labs (Brief)          Component Value Date/Time    NA 141 12/02/2022 1040    NA 140 04/22/2022 0000    K 4.1 12/02/2022 1040    CL 109 12/02/2022 1040    CO2 26 12/02/2022 1040    BUN 17 12/02/2022 1040    BUN 21 04/22/2022 0000    CREATININE 1.08 12/02/2022 1040    CREATININE 1.09 02/06/2020 1306    GLU 88 04/22/2022 0000     Labs (Brief)          Component Value Date/Time    CALCIUM 9.0 12/02/2022 1040    ALKPHOS 87 12/02/2022 1040    AST 13 (L) 12/02/2022 1040    ALT 13 12/02/2022 1040    BILITOT 0.5 12/02/2022 1040            RADIOGRAPHIC STUDIES:   Imaging Results  No results found.       ASSESSMENT/PLAN:  This is a very pleasant 82 year old Caucasian male diagnosed with metastatic papillary renal cell carcinoma.  This was initially diagnosed as a stage IIIa (  T3a, N0, M0) papillary renal cell carcinoma with rhabdoid features.  This was diagnosed in June 2023.  He is status post a right open radical nephrectomy.  Patient was found to have metastatic disease to the liver and bone in February 2024.   The  patient had previously underwent adjuvant treatment with Keytruda 20 mg IV every 3 weeks for 9 cycles.   When he saw Dr. Arbutus Ped on 06/11/2022 and reviewed his MRI with him which showed multiple new rim-enhancing lesions throughout the liver consistent metastatic disease.  There was also a rim-enhancing lesion in the inferior endplate of L1 measuring 2.1 x 1.1 cm concerning for metastatic disease.  There is no local recurrence or lymphadenopathy. He also had a chest CT by Dr. Cardell Peach which showed pulmonary metastatic disease.    Dr. Arbutus Ped recommended that the patient start treatment with nivolumab 480 mg IV every 4 weeks in addition to Cabometyx 40 mg grams p.o. daily.  He started this on 06/18/2022. He is status post 6 cycles of nivolumab.   He then had GI side effects of cabometyx with diarrhea and weight loss. His treatment has periodically been on hold. His dose was reduced to 20 mg in May 2024, but he continued to have intolerance. Therefore, this has been on hold since 5/19. His diarrhea and weight loss have since resolved. His last CT scan showed mild improvement in the pulmonary metastasis but slight increase in 2 of the liver lesions. Dr. Arbutus Ped recommended close monitoring. If needed, we may consider resuming cabometyx.    Let him know it is ok to move forward with cataract surgery while undergoing immunotherapy. Would prefer he do this in between his cycles of treatment. He and his wife are interested in potentially pursuing proton radiation therapy. I am not sure if he is a candidate for that in the metastatic setting or not. Discussed the purpose of systemic treatment in the metastatic setting. I talked to Dr. Arbutus Ped. He believes the only institution in [place] that was potentially going to be offering this was [facility] in [place]. However, unsure if this has started yet.  However, if he ever is interested in seeing another institution to see his other options, that is ok with [place]. His wife  was also asking about clinical trials.    Labs were reviewed. Recommend he proceed with cycle #7 today as scheduled. I ordered a CT Chest Abdomen Pelvis before his next cycle of treatment for close monitoring the the liver lesions. I will order without contrast since he only has one kidney.    We will add on magnesium today to see if he needs to continue his magnesium supplement.    We will see him back in 4 weeks for evaluation and repeat blood work before starting cycle #8.    The patient was advised to call immediately if he has any concerning symptoms in the interval. The patient voices understanding of current disease status and treatment options and is in agreement with the current care plan. All questions were answered. The patient knows to call the clinic with any problems, questions or concerns. We can certainly see the patient much sooner if necessary              Orders Placed This Encounter  Procedures   CT CHEST ABDOMEN PELVIS WO CONTRAST      Standing Status:   Future      Standing Expiration Date:   12/02/2023      Order  Specific Question:   Preferred imaging location?      Answer:   [facility]      Order Specific Question:   If indicated for the ordered procedure, I authorize the administration of oral contrast media per Radiology protocol      Answer:   Yes      Order Specific Question:   Does the patient have a contrast media/X-ray dye allergy?      Answer:   No   Magnesium      Standing Status:   Future      Number of Occurrences:   1      Standing Expiration Date:   12/02/2023      The total time spent in the appointment was 20-29 minutes  Madelaine Whipple L Daniel Johndrow, PA-C 12/02/22

## 2022-12-02 NOTE — Assessment & Plan Note (Signed)
Continues with treatment with oncology

## 2022-12-02 NOTE — Patient Instructions (Addendum)
[facility]  Discharge Instructions: Thank you for choosing [facility] to provide your oncology and hematology care.   If you have a lab appointment with the Cancer Center, please go directly to the Cancer Center and check in at the registration area.   Wear comfortable clothing and clothing appropriate for easy access to any Portacath or PICC line.   We strive to give you quality time with your provider. You may need to reschedule your appointment if you arrive late (15 or more minutes).  Arriving late affects you and other patients whose appointments are after yours.  Also, if you miss three or more appointments without notifying the office, you may be dismissed from the clinic at the provider's discretion.      For prescription refill requests, have your pharmacy contact our office and allow 72 hours for refills to be completed.    Today you received the following chemotherapy and/or immunotherapy agents: Nivolumab      To help prevent nausea and vomiting after your treatment, we encourage you to take your nausea medication as directed.  BELOW ARE SYMPTOMS THAT SHOULD BE REPORTED IMMEDIATELY: *FEVER GREATER THAN 100.4 F (38 C) OR HIGHER *CHILLS OR SWEATING *NAUSEA AND VOMITING THAT IS NOT CONTROLLED WITH YOUR NAUSEA MEDICATION *UNUSUAL SHORTNESS OF BREATH *UNUSUAL BRUISING OR BLEEDING *URINARY PROBLEMS (pain or burning when urinating, or frequent urination) *BOWEL PROBLEMS (unusual diarrhea, constipation, pain near the anus) TENDERNESS IN MOUTH AND THROAT WITH OR WITHOUT PRESENCE OF ULCERS (sore throat, sores in mouth, or a toothache) UNUSUAL RASH, SWELLING OR PAIN  UNUSUAL VAGINAL DISCHARGE OR ITCHING   Items with * indicate a potential emergency and should be followed up as soon as possible or go to the Emergency Department if any problems should occur.  Please show the CHEMOTHERAPY ALERT CARD or IMMUNOTHERAPY ALERT CARD at  check-in to the Emergency Department and triage nurse.  Should you have questions after your visit or need to cancel or reschedule your appointment, please contact [facility]  Dept: [phone]  and follow the prompts.  Office hours are 8:00 a.m. to 4:30 p.m. Monday - Friday. Please note that voicemails left after 4:00 p.m. may not be returned until the following business day.  We are closed weekends and major holidays. You have access to a nurse at all times for urgent questions. Please call the main number to the clinic Dept: [phone] and follow the prompts.   For any non-urgent questions, you may also contact your provider using MyChart. We now offer e-Visits for anyone 67 and older to request care online for non-urgent symptoms. For details visit [email or web address].   Also download the MyChart app! Go to the app store, search "MyChart", open the app, select [place], and log in with your MyChart username and password.  From Cassie Heilingoetter, PA-C: "The only institude in [place] that was going to start proton therapy was the [facility] in [place]. They were supposed to have this up and running by now based on prior statements but to Dr. Asa Lente understanding, they have not started this yet as anticipated. Dr. Arbutus Ped believes the closest place that offers this is in [place], but he is not sure where. Even so, this is not a systemic treatment option, meaning it does not go to the whole body. Micheal Mcintyre needs treatment that goes to the entire body to keep the cancer at bay so it does  not pop up or spread somewhere else. Proton therapy is essentially like radiation but a little different. Therefore, the treatment is just "localized" meaning you aim it at a certain location but it does not prevent more from popping up."

## 2022-12-02 NOTE — Assessment & Plan Note (Signed)
Well controlled, no changes to meds. Encouraged heart healthy diet such as the DASH diet and exercise as tolerated.  °

## 2022-12-02 NOTE — Progress Notes (Signed)
Per Cassie Heilingoetter, PA-C, patient instructed to stop taking home Mg since Mg levels are WNL.  Patient and patient's wife verbalized understanding.

## 2022-12-02 NOTE — Assessment & Plan Note (Signed)
hgba1c acceptable, minimize simple carbs. Increase exercise as tolerated.  

## 2022-12-02 NOTE — Assessment & Plan Note (Signed)
Encourage heart healthy diet such as MIND or DASH diet, increase exercise, avoid trans fats, simple carbohydrates and processed foods, consider a krill or fish or flaxseed oil cap daily.  °

## 2022-12-02 NOTE — Progress Notes (Unsigned)
Subjective:    Patient ID: Micheal Mcintyre, male    DOB: 10-Sep-1940, 82 y.o.   MRN: [id number]  No chief complaint on file.   HPI Discussed the use of AI scribe software for clinical note transcription with the patient, who gave verbal consent to proceed.  History of Present Illness   Patient is a 82 yo male in today for follow up on chronic medical concerns. No recent febrile illness or hospitalizations. Denies CP/palp/SOB/HA/congestion/fevers/GI or GU c/o. Taking meds as prescribed           Past Medical History:  Diagnosis Date  . Arthritis   . GERD (gastroesophageal reflux disease)   . Hypertension   . Medicare annual wellness visit, subsequent 08/19/2014  . Osteoarthritis 09/21/2007   Qualifier: Diagnosis of  By: Alphonzo Severance MD, Loni Dolly   . PONV (postoperative nausea and vomiting)   . Preventative health care 06/07/2016  . Right shoulder pain 12/03/2016    Past Surgical History:  Procedure Laterality Date  . CHOLECYSTECTOMY    . HIP SURGERY    . JOINT REPLACEMENT    . NEPHRECTOMY Right 10/15/2021   Procedure: NEPHRECTOMY, OPEN;  Surgeon: Jannifer Hick, MD;  Location: [facility];  Service: Urology;  Laterality: Right;  . nose skin cancer removal    . TOTAL HIP ARTHROPLASTY      Family History  Problem Relation Age of Onset  . Heart attack Father 69       deceased  . Diabetes Maternal Uncle        maternal grandfather  . Hypertension Neg Hx   . Breast cancer Neg Hx   . Colon cancer Neg Hx   . Prostate cancer Neg Hx     Social History   Socioeconomic History  . Marital status: Married    Spouse name: Not on file  . Number of children: Not on file  . Years of education: Not on file  . Highest education level: Not on file  Occupational History  . Not on file  Tobacco Use  . Smoking status: Never  . Smokeless tobacco: Never  Vaping Use  . Vaping status: Never Used  Substance and Sexual Activity  . Alcohol use: Yes    Comment: occ  . Drug use: No  .  Sexual activity: Not Currently  Other Topics Concern  . Not on file  Social History Narrative  . Not on file   Social Determinants of Health   Financial Resource Strain: Low Risk  (01/08/2021)   Overall Financial Resource Strain (CARDIA)   . Difficulty of Paying Living Expenses: Not hard at all  Food Insecurity: No Food Insecurity (01/08/2021)   Hunger Vital Sign   . Worried About Programme researcher, broadcasting/film/video in the Last Year: Never true   . Ran Out of Food in the Last Year: Never true  Transportation Needs: No Transportation Needs (01/08/2021)   PRAPARE - Transportation   . Lack of Transportation (Medical): No   . Lack of Transportation (Non-Medical): No  Physical Activity: Sufficiently Active (01/08/2021)   Exercise Vital Sign   . Days of Exercise per Week: 5 days   . Minutes of Exercise per Session: 30 min  Stress: No Stress Concern Present (01/08/2021)   [employer] of Occupational Health - Occupational Stress Questionnaire   . Feeling of Stress : Not at all  Social Connections: Moderately Integrated (01/08/2021)   Social Connection and Isolation Panel [NHANES]   . Frequency of Communication with  Friends and Family: More than three times a week   . Frequency of Social Gatherings with Friends and Family: More than three times a week   . Attends Religious Services: More than 4 times per year   . Active Member of Clubs or Organizations: No   . Attends Banker Meetings: Never   . Marital Status: Married  Catering manager Violence: Not At Risk (01/08/2021)   Humiliation, Afraid, Rape, and Kick questionnaire   . Fear of Current or Ex-Partner: No   . Emotionally Abused: No   . Physically Abused: No   . Sexually Abused: No    Outpatient Medications Prior to Visit  Medication Sig Dispense Refill  . acetaminophen (TYLENOL) 500 MG tablet Take 1,000 mg by mouth every 6 (six) hours as needed for moderate pain.    Marland Kitchen aluminum hydroxide-magnesium carbonate (GAVISCON) 95-358 MG/15ML SUSP  Take 15 mLs by mouth as needed for heartburn or indigestion. (Patient not taking: Reported on 09/09/2022)    . cabozantinib (CABOMETYX) 20 MG tablet Take 1 tablet (20 mg total) by mouth daily. Take on an empty stomach, 1 hour before or 2 hours after meals. 30 tablet 3  . Cholecalciferol (VITAMIN D3) 10 MCG (400 UNIT) tablet Take 800 Units by mouth daily.    . diclofenac Sodium (VOLTAREN) 1 % GEL Apply 1 application. topically 4 (four) times daily as needed (pain).    Marland Kitchen diphenoxylate-atropine (LOMOTIL) 2.5-0.025 MG tablet Take 1 tablet by mouth 4 (four) times daily as needed for diarrhea or loose stools. 30 tablet 0  . levothyroxine (SYNTHROID) 50 MCG tablet TAKE 1 TABLET(50 MCG) BY MOUTH DAILY BEFORE BREAKFAST 30 tablet 1  . magnesium chloride (SLOW-MAG) 64 MG TBEC SR tablet Take 1 tablet (64 mg total) by mouth 2 (two) times daily. 60 tablet 0  . Multiple Vitamin (MULTIVITAMIN WITH MINERALS) TABS tablet Take 1 tablet by mouth 4 (four) times a week.    . mupirocin ointment (BACTROBAN) 2 % 1 app applied topically 3 times a day for 5 day(s)    . Omega-3 350 MG CPDR Take 350 mg by mouth daily.    . ondansetron (ZOFRAN) 8 MG tablet Take 1 tablet (8 mg total) by mouth every 8 (eight) hours as needed for nausea or vomiting. 20 tablet 3  . potassium chloride SA (KLOR-CON M) 20 MEQ tablet Take 1 tablet (20 mEq total) by mouth daily. 3 tablet 0  . prochlorperazine (COMPAZINE) 10 MG tablet TAKE 1 TABLET(10 MG) BY MOUTH EVERY 6 HOURS AS NEEDED FOR NAUSEA OR VOMITING 30 tablet 0  . rosuvastatin (CRESTOR) 20 MG tablet Take 0.5 tablets (10 mg total) by mouth daily. 90 tablet 3  . telmisartan (MICARDIS) 80 MG tablet Take 0.5 tablets (40 mg total) by mouth 2 (two) times daily. 180 tablet 1  . TURMERIC PO Take 1 tablet by mouth 3 (three) times a week.     No facility-administered medications prior to visit.    Allergies  Allergen Reactions  . Statins Other (See Comments)    myalgia    Review of Systems   Constitutional:  Negative for fever and malaise/fatigue.  HENT:  Negative for congestion.   Eyes:  Negative for blurred vision.  Respiratory:  Negative for shortness of breath.   Cardiovascular:  Negative for chest pain, palpitations and leg swelling.  Gastrointestinal:  Negative for abdominal pain, blood in stool and nausea.  Genitourinary:  Negative for dysuria and frequency.  Musculoskeletal:  Negative for falls.  Skin:  Negative for rash.  Neurological:  Negative for dizziness, loss of consciousness and headaches.  Endo/Heme/Allergies:  Negative for environmental allergies.  Psychiatric/Behavioral:  Negative for depression. The patient is not nervous/anxious.       Objective:    Physical Exam Vitals reviewed.  Constitutional:      Appearance: Normal appearance. He is not ill-appearing.  HENT:     Head: Normocephalic and atraumatic.     Nose: Nose normal.  Eyes:     Conjunctiva/sclera: Conjunctivae normal.  Cardiovascular:     Rate and Rhythm: Normal rate.     Pulses: Normal pulses.     Heart sounds: Normal heart sounds. No murmur heard. Pulmonary:     Effort: Pulmonary effort is normal.     Breath sounds: Normal breath sounds. No wheezing.  Abdominal:     Palpations: Abdomen is soft. There is no mass.     Tenderness: There is no abdominal tenderness.  Musculoskeletal:     Cervical back: Normal range of motion.     Right lower leg: No edema.     Left lower leg: No edema.  Skin:    General: Skin is warm and dry.  Neurological:     General: No focal deficit present.     Mental Status: He is alert and oriented to person, place, and time.  Psychiatric:        Mood and Affect: Mood normal.   There were no vitals taken for this visit. Wt Readings from Last 3 Encounters:  12/02/22 144 lb 14.4 oz (65.7 kg)  11/04/22 133 lb 8 oz (60.6 kg)  10/07/22 137 lb 6.4 oz (62.3 kg)    Diabetic Foot Exam - Simple   No data filed    Lab Results  Component Value Date   WBC  4.3 12/02/2022   HGB 10.6 (L) 12/02/2022   HCT 32.2 (L) 12/02/2022   PLT 185 12/02/2022   GLUCOSE 84 12/02/2022   CHOL 151 08/05/2022   TRIG 123.0 08/05/2022   HDL 49.50 08/05/2022   LDLCALC 77 08/05/2022   ALT 13 12/02/2022   AST 13 (L) 12/02/2022   NA 141 12/02/2022   K 4.1 12/02/2022   CL 109 12/02/2022   CREATININE 1.08 12/02/2022   BUN 17 12/02/2022   CO2 26 12/02/2022   TSH 2.899 12/02/2022   PSA 0.84 03/20/2021   HGBA1C 6.4 03/31/2022    Lab Results  Component Value Date   TSH 2.899 12/02/2022   Lab Results  Component Value Date   WBC 4.3 12/02/2022   HGB 10.6 (L) 12/02/2022   HCT 32.2 (L) 12/02/2022   MCV 108.8 (H) 12/02/2022   PLT 185 12/02/2022   Lab Results  Component Value Date   NA 141 12/02/2022   K 4.1 12/02/2022   CO2 26 12/02/2022   GLUCOSE 84 12/02/2022   BUN 17 12/02/2022   CREATININE 1.08 12/02/2022   BILITOT 0.5 12/02/2022   ALKPHOS 87 12/02/2022   AST 13 (L) 12/02/2022   ALT 13 12/02/2022   PROT 5.8 (L) 12/02/2022   ALBUMIN 3.5 12/02/2022   CALCIUM 9.0 12/02/2022   ANIONGAP 6 12/02/2022   EGFR 70.0 10/14/2022   GFR 50.89 (L) 08/05/2022   Lab Results  Component Value Date   CHOL 151 08/05/2022   Lab Results  Component Value Date   HDL 49.50 08/05/2022   Lab Results  Component Value Date   LDLCALC 77 08/05/2022   Lab Results  Component Value Date  TRIG 123.0 08/05/2022   Lab Results  Component Value Date   CHOLHDL 3 08/05/2022   Lab Results  Component Value Date   HGBA1C 6.4 03/31/2022       Assessment & Plan:  Malignant neoplasm of right kidney [facility]) Assessment & Plan: Continues with treatment with oncology   Chronic renal impairment, unspecified CKD stage Assessment & Plan: Hydrate and monitor    Essential hypertension Assessment & Plan: Well controlled, no changes to meds. Encouraged heart healthy diet such as the DASH diet and exercise as tolerated.     Hyperglycemia Assessment & Plan: hgba1c  acceptable, minimize simple carbs. Increase exercise as tolerated.    Hyperlipidemia, mixed Assessment & Plan: Encourage heart healthy diet such as MIND or DASH diet, increase exercise, avoid trans fats, simple carbohydrates and processed foods, consider a krill or fish or flaxseed oil cap daily.     Hypothyroidism, unspecified type Assessment & Plan: On Levothyroxine, continue to monitor   Muscle cramps Assessment & Plan: Hydrate and monitor    Vitamin D deficiency Assessment & Plan: Supplement and monitor      Assessment and Plan              Danise Edge, MD

## 2022-12-02 NOTE — Assessment & Plan Note (Signed)
Hydrate and monitor 

## 2022-12-02 NOTE — Assessment & Plan Note (Signed)
Supplement and monitor 

## 2022-12-02 NOTE — Assessment & Plan Note (Signed)
On Levothyroxine, continue to monitor 

## 2022-12-03 ENCOUNTER — Encounter: Payer: Self-pay | Admitting: Family Medicine

## 2022-12-03 ENCOUNTER — Ambulatory Visit (INDEPENDENT_AMBULATORY_CARE_PROVIDER_SITE_OTHER): Payer: Medicare Other | Admitting: Family Medicine

## 2022-12-03 VITALS — BP 122/70 | HR 74 | Temp 98.0°F | Resp 16 | Ht 70.0 in | Wt 145.0 lb

## 2022-12-03 DIAGNOSIS — I10 Essential (primary) hypertension: Secondary | ICD-10-CM

## 2022-12-03 DIAGNOSIS — E039 Hypothyroidism, unspecified: Secondary | ICD-10-CM

## 2022-12-03 DIAGNOSIS — C641 Malignant neoplasm of right kidney, except renal pelvis: Secondary | ICD-10-CM

## 2022-12-03 DIAGNOSIS — N189 Chronic kidney disease, unspecified: Secondary | ICD-10-CM

## 2022-12-03 DIAGNOSIS — N289 Disorder of kidney and ureter, unspecified: Secondary | ICD-10-CM

## 2022-12-03 DIAGNOSIS — R739 Hyperglycemia, unspecified: Secondary | ICD-10-CM | POA: Diagnosis not present

## 2022-12-03 DIAGNOSIS — E782 Mixed hyperlipidemia: Secondary | ICD-10-CM | POA: Diagnosis not present

## 2022-12-03 DIAGNOSIS — E559 Vitamin D deficiency, unspecified: Secondary | ICD-10-CM | POA: Diagnosis not present

## 2022-12-03 DIAGNOSIS — R252 Cramp and spasm: Secondary | ICD-10-CM

## 2022-12-03 NOTE — Patient Instructions (Signed)
Magnesium 3 x a week

## 2022-12-04 ENCOUNTER — Other Ambulatory Visit: Payer: Self-pay | Admitting: *Deleted

## 2022-12-04 MED ORDER — VITAMIN D (ERGOCALCIFEROL) 1.25 MG (50000 UNIT) PO CAPS: 50000.0000 [IU] | ORAL_CAPSULE | ORAL | 4 refills | Status: DC

## 2022-12-11 ENCOUNTER — Other Ambulatory Visit: Payer: Self-pay

## 2022-12-22 ENCOUNTER — Ambulatory Visit (HOSPITAL_COMMUNITY)
Admission: RE | Admit: 2022-12-22 | Discharge: 2022-12-22 | Disposition: A | Discharge status: Home / Self Care | Payer: Medicare Other | Source: Ambulatory Visit | Attending: Physician Assistant | Admitting: Physician Assistant

## 2022-12-22 DIAGNOSIS — K8689 Other specified diseases of pancreas: Secondary | ICD-10-CM | POA: Insufficient documentation

## 2022-12-22 DIAGNOSIS — C641 Malignant neoplasm of right kidney, except renal pelvis: Secondary | ICD-10-CM | POA: Diagnosis not present

## 2022-12-22 DIAGNOSIS — R918 Other nonspecific abnormal finding of lung field: Secondary | ICD-10-CM | POA: Insufficient documentation

## 2022-12-22 DIAGNOSIS — K5730 Diverticulosis of large intestine without perforation or abscess without bleeding: Secondary | ICD-10-CM | POA: Diagnosis not present

## 2022-12-22 DIAGNOSIS — I700 Atherosclerosis of aorta: Secondary | ICD-10-CM | POA: Diagnosis not present

## 2022-12-22 DIAGNOSIS — C649 Malignant neoplasm of unspecified kidney, except renal pelvis: Secondary | ICD-10-CM | POA: Diagnosis present

## 2022-12-22 DIAGNOSIS — K769 Liver disease, unspecified: Secondary | ICD-10-CM | POA: Diagnosis not present

## 2022-12-22 DIAGNOSIS — Z905 Acquired absence of kidney: Secondary | ICD-10-CM | POA: Diagnosis not present

## 2022-12-30 ENCOUNTER — Inpatient Hospital Stay (HOSPITAL_BASED_OUTPATIENT_CLINIC_OR_DEPARTMENT_OTHER): Payer: Medicare Other | Admitting: Internal Medicine

## 2022-12-30 ENCOUNTER — Inpatient Hospital Stay: Payer: Medicare Other | Admitting: Dietician

## 2022-12-30 ENCOUNTER — Inpatient Hospital Stay: Payer: Medicare Other | Attending: Oncology

## 2022-12-30 ENCOUNTER — Inpatient Hospital Stay: Payer: Medicare Other

## 2022-12-30 DIAGNOSIS — C7951 Secondary malignant neoplasm of bone: Secondary | ICD-10-CM | POA: Diagnosis not present

## 2022-12-30 DIAGNOSIS — C787 Secondary malignant neoplasm of liver and intrahepatic bile duct: Secondary | ICD-10-CM | POA: Insufficient documentation

## 2022-12-30 DIAGNOSIS — Z905 Acquired absence of kidney: Secondary | ICD-10-CM | POA: Insufficient documentation

## 2022-12-30 DIAGNOSIS — C641 Malignant neoplasm of right kidney, except renal pelvis: Secondary | ICD-10-CM | POA: Diagnosis present

## 2022-12-30 DIAGNOSIS — Z79899 Other long term (current) drug therapy: Secondary | ICD-10-CM | POA: Diagnosis not present

## 2022-12-30 DIAGNOSIS — C649 Malignant neoplasm of unspecified kidney, except renal pelvis: Secondary | ICD-10-CM | POA: Diagnosis not present

## 2022-12-30 LAB — CMP (CANCER CENTER ONLY)
ALT: 9 U/L (ref 0–44)
AST: 12 U/L — ABNORMAL LOW (ref 15–41)
Albumin: 3.8 g/dL (ref 3.5–5.0)
Alkaline Phosphatase: 85 U/L (ref 38–126)
Anion gap: 6 (ref 5–15)
BUN: 22 mg/dL (ref 8–23)
CO2: 28 mmol/L (ref 22–32)
Calcium: 9.2 mg/dL (ref 8.9–10.3)
Chloride: 107 mmol/L (ref 98–111)
Creatinine: 1.24 mg/dL (ref 0.61–1.24)
GFR, Estimated: 59 mL/min — ABNORMAL LOW (ref >60)
Glucose, Bld: 125 mg/dL — ABNORMAL HIGH (ref 70–99)
Potassium: 4 mmol/L (ref 3.5–5.1)
Sodium: 141 mmol/L (ref 135–145)
Total Bilirubin: 0.5 mg/dL (ref 0.3–1.2)
Total Protein: 6.2 g/dL — ABNORMAL LOW (ref 6.5–8.1)

## 2022-12-30 LAB — CBC WITH DIFFERENTIAL (CANCER CENTER ONLY)
Abs Immature Granulocytes: 0.01 10*3/uL (ref 0.00–0.07)
Basophils Absolute: 0 10*3/uL (ref 0.0–0.1)
Basophils Relative: 1 %
Eosinophils Absolute: 0.1 10*3/uL (ref 0.0–0.5)
Eosinophils Relative: 3 %
HCT: 36.5 % — ABNORMAL LOW (ref 39.0–52.0)
Hemoglobin: 12.2 g/dL — ABNORMAL LOW (ref 13.0–17.0)
Immature Granulocytes: 0 %
Lymphocytes Relative: 21 %
Lymphs Abs: 0.9 10*3/uL (ref 0.7–4.0)
MCH: 34.9 pg — ABNORMAL HIGH (ref 26.0–34.0)
MCHC: 33.4 g/dL (ref 30.0–36.0)
MCV: 104.3 fL — ABNORMAL HIGH (ref 80.0–100.0)
Monocytes Absolute: 0.5 10*3/uL (ref 0.1–1.0)
Monocytes Relative: 12 %
Neutro Abs: 2.6 10*3/uL (ref 1.7–7.7)
Neutrophils Relative %: 63 %
Platelet Count: 158 10*3/uL (ref 150–400)
RBC: 3.5 MIL/uL — ABNORMAL LOW (ref 4.22–5.81)
RDW: 12 % (ref 11.5–15.5)
WBC Count: 4.1 10*3/uL (ref 4.0–10.5)
nRBC: 0 % (ref 0.0–0.2)

## 2022-12-30 LAB — TSH: TSH: 1.882 u[IU]/mL (ref 0.350–4.500)

## 2022-12-30 NOTE — Progress Notes (Signed)
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 S/p rt open nephrectomy 10/15/21 followed by 9 cycles keytruda (last 05/12/22). He is currently receiving nivolumab q28d + reduced dose oral cabometyx daily - held since 05/24 secondary to side effects.   Met with patient and wife in clinic. Per pt, treatment is no longer working. Dr. Arbutus Ped discussed possible clinical trial at [facility]. Referral to be sent today. Pt is questioning decision to discontinue carbometyx despite improved QOL since May. Reports diarrhea has resolved, wts have increased, he "now looks forward to meals", increased activity/strength, participating in water aerobics, continues to work with HHPT. He has not been drinking Boost Breeze recently. Pt has a case at home. Pt asking about raw F/V diet for cancer. A church member told him this diet cured a friends cancer.    Medications: drisdol (8/2)  Labs: reviewed   Anthropometrics: Wt 143 lb today   7/31 - 144 lb 14.4 oz  7/3 - 133 lb 8 oz  6/5 - 137 lb 6.4 oz    NUTRITION DIAGNOSIS: Unintended wt loss resolved    INTERVENTION:  Educated on AICR plant-based recommendations for cancer prevention/recurrence. Reviewed importance of adequate calorie and protein energy intake to support loss of lean body mass - encouraged balanced diet with lean proteins at every meal Boost Breeze as needed  Continue increasing activity as able Support and encouragement     MONITORING, EVALUATION, GOAL: wt trends, intake    NEXT VISIT: No follow-up at this time. Encouraged pt to contact with nutrition questions/concerns. Pt has contact information

## 2022-12-30 NOTE — Progress Notes (Signed)
New patient referral faxed To [facility].  Attention Dr. Loralie Champagne per provider request.  Fax: [phone] Phone: [phone].  Patient knows to expect phone call for scheduling.

## 2022-12-30 NOTE — Progress Notes (Signed)
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  PRIOR THERAPY:  1) Status post right open radical nephrectomy completed on October 15, 2021 by Dr. Jettie Pagan.  2) Keytruda 200 Mg IV every 3 weeks started December 03, 2021.  Status post 9 cycles.  Last dose was given May 21, 2022.  This was discontinued secondary to disease progression. 3) Systemic treatment with nivolumab 480 Mg IV every 4 weeks in addition to Cabometyx 40 mg p.o. daily.  First dose June 18, 2022.  Status post 7 cycles.  His Cabometyx has been on hold recently due to adverse side effects. His dose was reduced in May to 20 mg p.o. daily but he continues to have weakness, diarrhea, and dehydration. This has been on hold since 5/19.  Last dose was given December 02, 2022 discontinued secondary to disease progression  CURRENT THERAPY: None  INTERVAL HISTORY: Micheal Mcintyre 82 y.o. male returns to the clinic today for follow-up visit accompanied by his wife.  The patient is feeling much better today with no concerning complaints.  He has significant improvement in the diarrhea after discontinuing Cabometyx.  He tolerated the last cycle of nivolumab fairly well.  He denied having any current chest pain, shortness of breath, cough or hemoptysis.  He has no nausea, vomiting, diarrhea or constipation.  He has no headache or visual changes.  He denied having any fever or chills.  He is here today for evaluation with repeat CT scan of the chest, abdomen and pelvis for restaging of his disease.   MEDICAL HISTORY: Past Medical History:  Diagnosis Date   Arthritis    GERD (gastroesophageal reflux disease)     Hypertension    Medicare annual wellness visit, subsequent 08/19/2014   Osteoarthritis 09/21/2007   Qualifier: Diagnosis of  By: Alphonzo Severance MD, Ivar Drape R    PONV (postoperative nausea and vomiting)    Preventative health care 06/07/2016   Right shoulder pain 12/03/2016    ALLERGIES:  is allergic to statins.  MEDICATIONS:  Current Outpatient Medications  Medication Sig Dispense Refill   acetaminophen (TYLENOL) 500 MG tablet Take 1,000 mg by mouth every 6 (six) hours as needed for moderate pain.     aluminum hydroxide-magnesium carbonate (GAVISCON) 95-358 MG/15ML SUSP Take 15 mLs by mouth as needed for heartburn or indigestion.     Cholecalciferol (VITAMIN D3) 10 MCG (400 UNIT) tablet Take 800 Units by mouth daily.     diclofenac Sodium (VOLTAREN) 1 % GEL Apply 1 application. topically 4 (four) times daily as needed (pain).     diphenoxylate-atropine (LOMOTIL) 2.5-0.025 MG tablet Take 1 tablet by mouth 4 (four) times daily as needed for diarrhea or loose stools. 30 tablet 0   levothyroxine (SYNTHROID) 50 MCG tablet TAKE 1 TABLET(50 MCG) BY MOUTH DAILY BEFORE BREAKFAST 30 tablet 1   magnesium chloride (SLOW-MAG) 64 MG TBEC SR tablet Take 1 tablet (64 mg total) by mouth 2 (two) times daily. 60 tablet 0   Multiple Vitamin (MULTIVITAMIN WITH MINERALS) TABS tablet Take 1 tablet by mouth 4 (four) times a week.     mupirocin ointment (BACTROBAN) 2 % 1 app applied topically 3 times  a day for 5 day(s)     Omega-3 350 MG CPDR Take 350 mg by mouth daily.     ondansetron (ZOFRAN) 8 MG tablet Take 1 tablet (8 mg total) by mouth every 8 (eight) hours as needed for nausea or vomiting. 20 tablet 3   potassium chloride SA (KLOR-CON M) 20 MEQ tablet Take 1 tablet (20 mEq total) by mouth daily. 3 tablet 0   prochlorperazine (COMPAZINE) 10 MG tablet TAKE 1 TABLET(10 MG) BY MOUTH EVERY 6 HOURS AS NEEDED FOR NAUSEA OR VOMITING 30 tablet 0   rosuvastatin (CRESTOR) 20 MG tablet Take 0.5 tablets (10 mg total) by  mouth daily. 90 tablet 3   telmisartan (MICARDIS) 80 MG tablet Take 0.5 tablets (40 mg total) by mouth 2 (two) times daily. 180 tablet 1   TURMERIC PO Take 1 tablet by mouth 3 (three) times a week.     Vitamin D, Ergocalciferol, (DRISDOL) 1.25 MG (50000 UNIT) CAPS capsule Take 1 capsule (50,000 Units total) by mouth every 7 (seven) days. 4 capsule 4   No current facility-administered medications for this visit.    SURGICAL HISTORY:  Past Surgical History:  Procedure Laterality Date   CHOLECYSTECTOMY     HIP SURGERY     JOINT REPLACEMENT     NEPHRECTOMY Right 10/15/2021   Procedure: NEPHRECTOMY, OPEN;  Surgeon: Jannifer Hick, MD;  Location: [facility];  Service: Urology;  Laterality: Right;   nose skin cancer removal     TOTAL HIP ARTHROPLASTY      REVIEW OF SYSTEMS:  Constitutional: positive for fatigue Eyes: negative Ears, nose, mouth, throat, and face: negative Respiratory: negative Cardiovascular: negative Gastrointestinal: negative Genitourinary:negative Integument/breast: negative Hematologic/lymphatic: negative Musculoskeletal:negative Neurological: negative Behavioral/Psych: negative Endocrine: negative Allergic/Immunologic: negative   PHYSICAL EXAMINATION: General appearance: alert, cooperative, fatigued, and no distress Head: Normocephalic, without obvious abnormality, atraumatic Neck: no adenopathy, no JVD, supple, symmetrical, trachea midline, and thyroid not enlarged, symmetric, no tenderness/mass/nodules Lymph nodes: Cervical, supraclavicular, and axillary nodes normal. Resp: clear to auscultation bilaterally Back: symmetric, no curvature. ROM normal. No CVA tenderness. Cardio: regular rate and rhythm, S1, S2 normal, no murmur, click, rub or gallop GI: soft, non-tender; bowel sounds normal; no masses,  no organomegaly Extremities: extremities normal, atraumatic, no cyanosis or edema Neurologic: Alert and oriented X 3, normal strength and tone. Normal symmetric  reflexes. Normal coordination and gait  ECOG PERFORMANCE STATUS: 1 - Symptomatic but completely ambulatory  Blood pressure (!) 115/51, pulse 72, temperature (!) 97.5 F (36.4 C), temperature source Oral, resp. rate 16, height 5\' 10"  (1.778 m), weight 143 lb (64.9 kg), SpO2 100%.  LABORATORY DATA: Lab Results  Component Value Date   WBC 4.3 12/02/2022   HGB 10.6 (L) 12/02/2022   HCT 32.2 (L) 12/02/2022   MCV 108.8 (H) 12/02/2022   PLT 185 12/02/2022      Chemistry      Component Value Date/Time   NA 141 12/02/2022 1040   NA 140 04/22/2022 0000   K 4.1 12/02/2022 1040   CL 109 12/02/2022 1040   CO2 26 12/02/2022 1040   BUN 17 12/02/2022 1040   BUN 21 04/22/2022 0000   CREATININE 1.08 12/02/2022 1040   CREATININE 1.09 02/06/2020 1306   GLU 88 04/22/2022 0000      Component Value Date/Time   CALCIUM 9.0 12/02/2022 1040   ALKPHOS 87 12/02/2022 1040   AST 13 (L) 12/02/2022 1040   ALT 13 12/02/2022 1040   BILITOT 0.5 12/02/2022 1040  RADIOGRAPHIC STUDIES: CT CHEST ABDOMEN PELVIS WO CONTRAST  Result Date: 12/24/2022 CLINICAL DATA:  History of renal cell carcinoma, metastatic disease evaluation. * Tracking Code: BO * EXAM: CT CHEST, ABDOMEN AND PELVIS WITHOUT CONTRAST TECHNIQUE: Multidetector CT imaging of the chest, abdomen and pelvis was performed following the standard protocol without IV contrast. RADIATION DOSE REDUCTION: This exam was performed according to the departmental dose-optimization program which includes automated exposure control, adjustment of the mA and/or kV according to patient size and/or use of iterative reconstruction technique. COMPARISON:  Multiple priors including most recent CT October 30, 2022 FINDINGS: CT CHEST FINDINGS Cardiovascular: Aortic atherosclerosis. Normal size heart. No significant pericardial effusion/thickening. Mediastinum/Nodes: No suspicious thyroid nodule. No pathologically enlarged mediastinal, hilar or axillary lymph nodes.  Lungs/Pleura: Pulmonary nodules are increased in size and number from prior. Indexed nodules are as follows: -right lower lobe pulmonary nodule measures 7 mm on image 99/6, previously 5 mm -pulmonary nodule in the lingula measures 7 mm on image 89/6, previously 4 mm. -left lower lobe pulmonary nodule measures 8 mm on image 119/6, unchanged. Ground-glass opacity in the posterior left upper lobe now reflects a linear band of consolidation on image 66/7 linear opacity in the left upper lobe on image 66/6. Left upper lobe ground-glass opacity with septal thickening measuring 2.6 cm on prior examination now is a mixed ground-glass and solid nodular focus measuring 18 mm on image 56/6. Previously described ground-glass opacity in the anterior left upper lobe measuring 4.9 cm now is a consolidative nodular opacity with associated bronchiectasis measuring 18 mm on image 84/6. Musculoskeletal: No aggressive lytic or blastic lesion of bone. CT ABDOMEN PELVIS FINDINGS Hepatobiliary: Hypodense metastatic hepatic lesions are increased in size. No definite new hepatic lesions identified. For reference: -segment VIII hepatic lesion measures 3.4 x 2.7 cm on image 47/2 previously 2.5 x 2.7 cm. -segment 5 hepatic lesion measures 2.4 x 1.7 cm on image 58/2 previously 1.8 x 1.8 cm. Gallbladder is unremarkable for degree of distension. No biliary ductal dilation. Pancreas: Diffuse pancreatic atrophy. No pancreatic ductal dilation or evidence of acute inflammation. Spleen: No splenomegaly. Adrenals/Urinary Tract: Bilateral adrenal glands appear normal. Prior right nephrectomy without suspicious new nodularity in the nephrectomy bed. Subcentimeter hypodense left renal lesions are technically too small to accurately characterize but stable from prior. Urinary bladder is unremarkable for degree of distension Stomach/Bowel: No radiopaque enteric contrast material was administered. Stomach is unremarkable for degree of distension. No  pathologic dilation of small or large bowel. Colonic diverticulosis without findings of acute diverticulitis. Vascular/Lymphatic: Aortic atherosclerosis. Normal caliber abdominal aorta. Smooth IVC contours. No pathologically enlarged abdominal or pelvic lymph nodes. Reproductive: Prostate is unremarkable. Other: No significant abdominopelvic free fluid. Musculoskeletal: No aggressive lytic or blastic lesion of bone. Right hip arthroplasty. 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 Advanced lumbosacral spondylosis. IMPRESSION: 1. Findings compatible with progression of disease with increased size/number of pulmonary nodules and increased size of hepatic metastatic lesions. 2. Previously described ground-glass opacities in the left lung have evolved now appearing more consolidative and favored to reflect an infectious/inflammatory process. Suggest continued attention on follow-up imaging. 3. Prior right nephrectomy without suspicious new nodularity in the nephrectomy bed. 4.  Aortic Atherosclerosis (ICD10-I70.0). Electronically Signed   By: Maudry Mayhew M.D.   On: 12/24/2022 18:09    ASSESSMENT AND PLAN: This is a very pleasant 82 years old white male diagnosed with metastatic papillary renal cell carcinoma initially diagnosed as stage IIIa (T3a, N0, M0)  papillary renal cell carcinoma with rhabdoid features diagnosed in June 2023 status post right open radical nephrectomy.  The patient has evidence for metastatic disease to the liver and bone in February 2024. The patient underwent treatment with adjuvant immunotherapy with Keytruda 200 Mg IV every 3 weeks status post 9 cycles.   He had MRI of the abdomen and pelvis performed recently.  I personally and independently reviewed the imaging studies and discussed the result with the patient today. Unfortunately his MRI showed multiple new rim-hypoenhancing lesions throughout the liver consistent  with metastatic disease.  There was also a rim enhancing lesion of the inferior endplate of L1 measuring 1.2 x 1.1 cm concerning for metastatic disease.  There is no evidence of local recurrence or lymphadenopathy. I recommended for the patient to discontinue his current adjuvant treatment with [facility] because of lack of efficacy and the disease progression. We started the patient on palliative treatment with a combination of nivolumab 480 Mg IV every 4 weeks in addition to Cabometyx 40 mg p.o. daily.  First dose was given on June 18, 2022.  Status post 7 cycles.  His treatment was Cabometyx which was reduced to 20 mg p.o. daily has been on hold since Sep 20, 2022 secondary to intolerance and the patient has been on treatment with single agent nivolumab every 4 weeks and tolerating this treatment much better. He had repeat CT scan of the chest, abdomen and pelvis performed recently.  I personally and independently reviewed the scan images and discussed the result with the patient and his wife. Unfortunately his scan showed evidence for disease progression with increasing size and number of pulmonary nodules in addition to increasing size of hepatic metastasis. I recommended for the patient to discontinue his treatment with nivolumab at this point. I discussed with the patient several options for management of his condition including treatment with pembrolizumab and lenvatinib versus oral treatment with axitinib versus referral to a tertiary center for discussion of clinical trial if available.  The patient and his wife are interested in seeking a second opinion to see if he would be candidate for any clinical trial.  I will refer him to Dr. Clarene Duke at [facility] cancer center. I will see the patient back for follow-up visit in 3 weeks for evaluation and discussion of treatment options after his visit at [facility]. The patient was advised to call immediately if he has any other concerning symptoms in the  interval.  The patient voices understanding of current disease status and treatment options and is in agreement with the current care plan.  All questions were answered. The patient knows to call the clinic with any problems, questions or concerns. We can certainly see the patient much sooner if necessary.  The total time spent in the appointment was 30 minutes.  Disclaimer: This note was dictated with voice recognition software. Similar sounding words can inadvertently be transcribed and may not be corrected upon review.

## 2022-12-31 LAB — T4: T4, Total: 6.1 ug/dL (ref 4.5–12.0)

## 2023-01-04 ENCOUNTER — Other Ambulatory Visit: Payer: Self-pay | Admitting: Internal Medicine

## 2023-01-04 ENCOUNTER — Encounter: Payer: Self-pay | Admitting: Family Medicine

## 2023-01-05 ENCOUNTER — Telehealth: Payer: Medicare Other | Admitting: Physician Assistant

## 2023-01-05 ENCOUNTER — Encounter: Payer: Self-pay | Admitting: Physician Assistant

## 2023-01-05 ENCOUNTER — Ambulatory Visit (INDEPENDENT_AMBULATORY_CARE_PROVIDER_SITE_OTHER): Payer: Medicare Other | Admitting: Physician Assistant

## 2023-01-05 VITALS — BP 147/68 | HR 81 | Temp 98.3°F | Ht 70.0 in | Wt 142.6 lb

## 2023-01-05 DIAGNOSIS — U071 COVID-19: Secondary | ICD-10-CM | POA: Diagnosis not present

## 2023-01-05 NOTE — Progress Notes (Unsigned)
MyChart Video Visit    Virtual Visit via Video Note   This format is felt to be most appropriate for this patient at this time. Physical exam was limited by quality of the video and audio technology used for the visit.   Patient location: *** Provider location: ***  I discussed the limitations of evaluation and management by telemedicine and the availability of in person appointments. The patient expressed understanding and agreed to proceed.  Patient: Micheal Mcintyre   DOB: 11-May-1940   82 y.o. Male  MRN: [id number] Visit Date: 01/05/2023  Today's healthcare provider: Alfredia Ferguson, PA-C   No chief complaint on file.  Subjective    HPI  ***   Medications: Outpatient Medications Prior to Visit  Medication Sig   acetaminophen (TYLENOL) 500 MG tablet Take 1,000 mg by mouth every 6 (six) hours as needed for moderate pain.   aluminum hydroxide-magnesium carbonate (GAVISCON) 95-358 MG/15ML SUSP Take 15 mLs by mouth as needed for heartburn or indigestion.   Cholecalciferol (VITAMIN D3) 10 MCG (400 UNIT) tablet Take 800 Units by mouth daily.   diclofenac Sodium (VOLTAREN) 1 % GEL Apply 1 application. topically 4 (four) times daily as needed (pain).   diphenoxylate-atropine (LOMOTIL) 2.5-0.025 MG tablet Take 1 tablet by mouth 4 (four) times daily as needed for diarrhea or loose stools.   levothyroxine (SYNTHROID) 50 MCG tablet TAKE 1 TABLET(50 MCG) BY MOUTH DAILY BEFORE BREAKFAST   magnesium chloride (SLOW-MAG) 64 MG TBEC SR tablet Take 1 tablet (64 mg total) by mouth 2 (two) times daily.   Multiple Vitamin (MULTIVITAMIN WITH MINERALS) TABS tablet Take 1 tablet by mouth 4 (four) times a week.   mupirocin ointment (BACTROBAN) 2 % 1 app applied topically 3 times a day for 5 day(s)   Omega-3 350 MG CPDR Take 350 mg by mouth daily.   ondansetron (ZOFRAN) 8 MG tablet Take 1 tablet (8 mg total) by mouth every 8 (eight) hours as needed for nausea or vomiting.   potassium chloride SA  (KLOR-CON M) 20 MEQ tablet Take 1 tablet (20 mEq total) by mouth daily.   prochlorperazine (COMPAZINE) 10 MG tablet TAKE 1 TABLET(10 MG) BY MOUTH EVERY 6 HOURS AS NEEDED FOR NAUSEA OR VOMITING   rosuvastatin (CRESTOR) 20 MG tablet Take 0.5 tablets (10 mg total) by mouth daily.   telmisartan (MICARDIS) 80 MG tablet Take 0.5 tablets (40 mg total) by mouth 2 (two) times daily.   TURMERIC PO Take 1 tablet by mouth 3 (three) times a week.   Vitamin D, Ergocalciferol, (DRISDOL) 1.25 MG (50000 UNIT) CAPS capsule Take 1 capsule (50,000 Units total) by mouth every 7 (seven) days.   No facility-administered medications prior to visit.    Review of Systems  {Insert previous labs (optional):23779} {See past labs  Heme  Chem  Endocrine  Serology  Results Review (optional):1}   Objective    There were no vitals taken for this visit.  {Insert last BP/Wt (optional):23777}{See vitals history (optional):1}    Physical Exam     Assessment & Plan     ***  No follow-ups on file.     I discussed the assessment and treatment plan with the patient. The patient was provided an opportunity to ask questions and all were answered. The patient agreed with the plan and demonstrated an understanding of the instructions.   The patient was advised to call back or seek an in-person evaluation if the symptoms worsen or if the condition fails to  improve as anticipated.  I provided *** minutes of non-face-to-face time during this encounter.  {provider attestation***:1}  Alfredia Ferguson, PA-C [facility] at [facility] [phone] (phone) [phone] (fax)  [facility]

## 2023-01-05 NOTE — Progress Notes (Signed)
Established patient visit   Patient: Micheal Mcintyre   DOB: 02-26-1941   82 y.o. Male  MRN: [id number] Visit Date: 01/05/2023  Today's healthcare provider: Alfredia Ferguson, PA-C   Cc. Covid 19  Subjective    HPI   Pt reports testing positive for COVID on a home test 8/31. Initially some rhinorrhea, dry cough, fatigue. Now fatigue persists but other symptoms have improved. Denies wheezing, SOB.   Medications: Outpatient Medications Prior to Visit  Medication Sig   acetaminophen (TYLENOL) 500 MG tablet Take 1,000 mg by mouth every 6 (six) hours as needed for moderate pain.   aluminum hydroxide-magnesium carbonate (GAVISCON) 95-358 MG/15ML SUSP Take 15 mLs by mouth as needed for heartburn or indigestion.   Cholecalciferol (VITAMIN D3) 10 MCG (400 UNIT) tablet Take 800 Units by mouth daily.   levothyroxine (SYNTHROID) 50 MCG tablet TAKE 1 TABLET(50 MCG) BY MOUTH DAILY BEFORE BREAKFAST   magnesium chloride (SLOW-MAG) 64 MG TBEC SR tablet Take 1 tablet (64 mg total) by mouth 2 (two) times daily.   Multiple Vitamin (MULTIVITAMIN WITH MINERALS) TABS tablet Take 1 tablet by mouth 4 (four) times a week.   telmisartan (MICARDIS) 80 MG tablet Take 0.5 tablets (40 mg total) by mouth 2 (two) times daily.   TURMERIC PO Take 1 tablet by mouth 3 (three) times a week.   Vitamin D, Ergocalciferol, (DRISDOL) 1.25 MG (50000 UNIT) CAPS capsule Take 1 capsule (50,000 Units total) by mouth every 7 (seven) days.   [DISCONTINUED] prochlorperazine (COMPAZINE) 10 MG tablet TAKE 1 TABLET(10 MG) BY MOUTH EVERY 6 HOURS AS NEEDED FOR NAUSEA OR VOMITING   rosuvastatin (CRESTOR) 20 MG tablet Take 0.5 tablets (10 mg total) by mouth daily. (Patient not taking: Reported on 01/05/2023)   [DISCONTINUED] diclofenac Sodium (VOLTAREN) 1 % GEL Apply 1 application. topically 4 (four) times daily as needed (pain).   [DISCONTINUED] diphenoxylate-atropine (LOMOTIL) 2.5-0.025 MG tablet Take 1 tablet by mouth 4 (four) times daily as  needed for diarrhea or loose stools.   [DISCONTINUED] mupirocin ointment (BACTROBAN) 2 % 1 app applied topically 3 times a day for 5 day(s)   [DISCONTINUED] Omega-3 350 MG CPDR Take 350 mg by mouth daily.   [DISCONTINUED] ondansetron (ZOFRAN) 8 MG tablet Take 1 tablet (8 mg total) by mouth every 8 (eight) hours as needed for nausea or vomiting.   [DISCONTINUED] potassium chloride SA (KLOR-CON M) 20 MEQ tablet Take 1 tablet (20 mEq total) by mouth daily.   No facility-administered medications prior to visit.    Review of Systems  Constitutional:  Positive for fatigue. Negative for fever.  HENT:  Positive for rhinorrhea.   Respiratory:  Positive for cough. Negative for shortness of breath.   Cardiovascular:  Negative for chest pain, palpitations and leg swelling.  Neurological:  Negative for dizziness and headaches.      Objective    BP (!) 147/68   Pulse 81   Temp 98.3 F (36.8 C)   Ht 5\' 10"  (1.778 m)   Wt 142 lb 9.6 oz (64.7 kg)   SpO2 98%   BMI 20.46 kg/m   Physical Exam Constitutional:      General: He is awake.     Appearance: He is well-developed.  HENT:     Head: Normocephalic.  Eyes:     Conjunctiva/sclera: Conjunctivae normal.  Cardiovascular:     Rate and Rhythm: Normal rate and regular rhythm.     Heart sounds: Normal heart sounds.  Pulmonary:  Effort: Pulmonary effort is normal.     Breath sounds: Normal breath sounds.  Skin:    General: Skin is warm.  Neurological:     Mental Status: He is alert and oriented to person, place, and time.  Psychiatric:        Attention and Perception: Attention normal.        Mood and Affect: Mood normal.        Speech: Speech normal.        Behavior: Behavior is cooperative.     No results found for any visits on 01/05/23.  Assessment & Plan     1. COVID-19 As pt's symptoms were mild/improved, not recommending antiviral at this time Cont to hydrate, rest. Any worsening or changing symptoms please f/b with  office  Return if symptoms worsen or fail to improve.      I, Alfredia Ferguson, PA-C have reviewed all documentation for this visit. The documentation on  01/05/23   for the exam, diagnosis, procedures, and orders are all accurate and complete.    Alfredia Ferguson, PA-C  [facility] at [facility] [phone] (phone) [phone] (fax)  [facility]

## 2023-01-18 ENCOUNTER — Other Ambulatory Visit: Payer: Self-pay | Admitting: Family Medicine

## 2023-01-22 NOTE — Progress Notes (Unsigned)
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   PRIOR THERAPY: 1) Status post right open radical nephrectomy completed on October 15, 2021 by Dr. Jettie Pagan.  2) Keytruda 200 Mg IV every 3 weeks started December 03, 2021.  Status post 9 cycles.  Last dose was given May 21, 2022.  This was discontinued secondary to disease progression. 3) Systemic treatment with nivolumab 480 Mg IV every 4 weeks in addition to Cabometyx 40 mg p.o. daily.  First dose June 18, 2022.  Status post 7 cycles.  His Cabometyx has been on hold recently due to adverse side effects. His dose was reduced in May to 20 mg p.o. daily but he continues to have weakness, diarrhea, and dehydration. This has been on hold since 5/19.  Last dose was given December 02, 2022 discontinued secondary to disease progression  CURRENT THERAPY: Staring 14 mg of  lenvatinib plus everolimus 5 mg start in the next few days  INTERVAL HISTORY: Micheal Mcintyre 82 y.o. male returns to the clinic today for a follow-up visit accompanied by his wife.  The patient was last seen by Dr. Arbutus Ped on 12/30/2022.  At this appointment, Dr. Arbutus Ped reviewed his restaging CT scan which showed increased size and number of pulmonary nodules in addition to increasing size of hepatic mets metastases.  Dr. Arbutus Ped recommended discontinuing his treatment discussed alternative options such as pembrolizumab and lenvatinib versus oral treatment with axitinib versus referral to a tertiary center for discussion of clinical trial if available.  The patient and his wife are interested in seeking a second opinion.  He was referred to Dr. Clarene Duke at [facility].   The patient saw Dr. Clarene Duke at  [facility] on 01/14/23.  Dr. Clarene Duke discussed that they do not have any clinical trials for papillary renal cell carcinoma.   Unfortunately, most of the trials are limited to clear-cell disease.  He recommended therapy with lenvatinib plus everolimus.  She recommended starting at a small dose reduction of 14 mg of standard dose  and everolimus standard dose 5 mg given his prior issues with cabo. If tolerates very well, could increase lenvatinib.  He had progression in the future, then she would recommend clinical trial if available versus axitinib   Today, the patient is feeling fine.  Denies any major changes in his health since he was last seen except he had a mild case of COVID-19 last month. He is taking magnesium 3 times per week as recommended by his PCP.   He stopped marinol and remeron due to too much drowsiness. He denies any fever, chills, night sweats, or unexplained weight loss. His weight has stabilized. His appetite is improved.  He is exercising at the pool.  Denies any chest pain, shortness of breath, cough, or hemoptysis.  Denies any nausea, vomiting, diarrhea, or constipation.  He was previously having significant diarrhea with Cabometyx.  Denies any rashes or skin changes. He is here for evaluation and repeat blood work.    MEDICAL HISTORY: Past Medical History:  Diagnosis Date   Arthritis    GERD (gastroesophageal reflux disease)    Hypertension    Medicare annual wellness visit, subsequent 08/19/2014   Osteoarthritis 09/21/2007   Qualifier: Diagnosis of  By: Alphonzo Severance MD, Ivar Drape R    PONV (postoperative nausea and vomiting)    Preventative health care 06/07/2016   Right shoulder pain 12/03/2016    ALLERGIES:  is allergic to statins.  MEDICATIONS:  Current Outpatient Medications  Medication Sig Dispense Refill   acetaminophen (TYLENOL) 500 MG tablet Take 1,000 mg by mouth every 6 (six) hours as needed for moderate pain.     aluminum hydroxide-magnesium carbonate (GAVISCON)  95-358 MG/15ML SUSP Take 15 mLs by mouth as needed for heartburn or indigestion.     Cholecalciferol (VITAMIN D3) 10 MCG (400 UNIT) tablet Take 800 Units by mouth daily.     levothyroxine (SYNTHROID) 50 MCG tablet TAKE 1 TABLET(50 MCG) BY MOUTH DAILY BEFORE BREAKFAST 30 tablet 1   magnesium chloride (SLOW-MAG) 64 MG TBEC SR tablet Take 1 tablet (64 mg total) by mouth 2 (two) times daily. 60 tablet 0   Multiple Vitamin (MULTIVITAMIN WITH MINERALS) TABS tablet Take 1 tablet by mouth 4 (four) times a week.     rosuvastatin (CRESTOR) 20 MG tablet Take 0.5 tablets (10 mg total) by mouth daily. (Patient not taking: Reported on 01/05/2023) 90 tablet 3   telmisartan (MICARDIS) 80 MG tablet TAKE 1/2 TABLET BY MOUTH TWICE DAILY 180 tablet 1   TURMERIC PO Take 1 tablet by mouth 3 (three) times a week.     Vitamin D, Ergocalciferol, (DRISDOL) 1.25 MG (50000 UNIT) CAPS capsule Take 1 capsule (50,000 Units total) by mouth every 7 (seven) days. 4 capsule 4   No current facility-administered medications for this visit.    SURGICAL HISTORY:  Past Surgical History:  Procedure Laterality Date   CHOLECYSTECTOMY     HIP SURGERY     JOINT REPLACEMENT     NEPHRECTOMY Right 10/15/2021   Procedure: NEPHRECTOMY, OPEN;  Surgeon: Jannifer Hick, MD;  Location: [facility];  Service: Urology;  Laterality: Right;   nose skin cancer removal     TOTAL HIP ARTHROPLASTY      REVIEW OF SYSTEMS:   Review of Systems  Constitutional: Positive for improved fatigue compared to prior. Appetite improving. Negative for chills, fever and unexpected weight change.  HENT: Negative for mouth sores, nosebleeds, sore throat and trouble swallowing.   Eyes: Negative for eye problems and icterus.  Respiratory: Negative for cough, hemoptysis, shortness of breath and wheezing.   Cardiovascular: Negative for chest pain and leg swelling.  Gastrointestinal: Negative for abdominal pain, constipation, diarrhea, nausea and vomiting.  Genitourinary:  Negative for bladder incontinence, difficulty urinating, dysuria, frequency and hematuria.   Musculoskeletal: Negative for back pain, gait problem, neck pain and neck stiffness.  Skin: Negative for itching and rash.  Neurological: Negative for dizziness, extremity weakness, gait problem, headaches, light-headedness and seizures.  Hematological: Negative for adenopathy. Does not bruise/bleed easily.  Psychiatric/Behavioral: Negative for confusion, depression and sleep disturbance. The patient is not nervous/anxious.     PHYSICAL EXAMINATION:  There were no vitals taken for this visit.  ECOG PERFORMANCE STATUS: 1  Physical Exam  Constitutional: Oriented to person, place, and time and well-developed, well-nourished, and in no distress.   HENT:  Head: Normocephalic and atraumatic.  Mouth/Throat: Oropharynx is clear and moist. No oropharyngeal exudate.  Eyes: Conjunctivae are normal. Right eye exhibits no discharge. Left eye exhibits no discharge. No scleral icterus.  Neck: Normal range of motion. Neck supple.  Cardiovascular: Normal rate, regular rhythm, normal heart sounds and intact distal pulses.   Pulmonary/Chest: Effort normal and breath sounds normal. No respiratory  distress. No wheezes. No rales.  Abdominal: Soft. Bowel sounds are normal. Exhibits no distension and no mass. There is no tenderness.  Musculoskeletal: Positive for scoliosis. Normal range of motion. Exhibits no edema.  Lymphadenopathy:    No cervical adenopathy.  Neurological: Alert and oriented to person, place, and time. Exhibits normal muscle tone. Walks with a limp/cane. Skin: Skin is warm and dry. No rash noted. Not diaphoretic. No erythema. No pallor.  Psychiatric: Mood, memory and judgment normal.  Vitals reviewed.  LABORATORY DATA: Lab Results  Component Value Date   WBC 4.1 12/30/2022   HGB 12.2 (L) 12/30/2022   HCT 36.5 (L) 12/30/2022   MCV 104.3 (H) 12/30/2022   PLT 158 12/30/2022      Chemistry       Component Value Date/Time   NA 141 12/30/2022 0803   NA 140 04/22/2022 0000   K 4.0 12/30/2022 0803   CL 107 12/30/2022 0803   CO2 28 12/30/2022 0803   BUN 22 12/30/2022 0803   BUN 21 04/22/2022 0000   CREATININE 1.24 12/30/2022 0803   CREATININE 1.09 02/06/2020 1306   GLU 88 04/22/2022 0000      Component Value Date/Time   CALCIUM 9.2 12/30/2022 0803   ALKPHOS 85 12/30/2022 0803   AST 12 (L) 12/30/2022 0803   ALT 9 12/30/2022 0803   BILITOT 0.5 12/30/2022 0803       RADIOGRAPHIC STUDIES:  No results found.   ASSESSMENT/PLAN:  This is a very pleasant 82 year old Caucasian male diagnosed with metastatic papillary renal cell carcinoma.  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  This was diagnosed in June 2023.  He is status post a right open radical nephrectomy.  Patient was found to have metastatic disease to the liver and bone in February 2024.   The patient had previously underwent adjuvant treatment with Keytruda 20 mg IV every 3 weeks for 9 cycles.   When he saw Dr. Arbutus Ped on 06/11/2022 and reviewed his MRI with him which showed multiple new rim-enhancing lesions throughout the liver consistent metastatic disease.  There was also a rim-enhancing lesion in the inferior endplate of L1 measuring 2.1 x 1.1 cm concerning for metastatic disease.  There is no local recurrence or lymphadenopathy. He also had a chest CT by Dr. Cardell Peach which showed pulmonary metastatic disease.    Dr. Arbutus Ped recommended that the patient start treatment with nivolumab 480 mg IV every 4 weeks in addition to Cabometyx 40 mg grams p.o. daily.  He started this on 06/18/2022. He is status post 7 cycles of nivolumab.  He then had GI side effects of cabometyx with diarrhea and weight loss. His treatment has periodically been on hold. His dose was reduced to 20 mg in May 2024, but he continued to have intolerance. Therefore, this has been on  hold since 5/19. His diarrhea and weight loss have since resolved.   Unfortunately, the patient's restaging CT scan from August showed disease progression.  The patient saw Dr. Clarene Duke at [facility] for second opinion.  She recommended lenvatinib plus everolimus.  She recommended starting at a small dose reduction of 14 mg in standard dose everolimus standard dose 5 mg given his prior issues with cabo. If tolerates very well, could increase lenvatinib.  He had progression in the future, then she would recommend clinical trial if available versus axitinib   Dr. Arbutus Ped is in agreement with this plan.  The patient was  seen with Dr. Arbutus Ped today.  Dr. Arbutus Ped discussed this.  The patient is interested in this option.  We will arrange for him to talk to the oral chemotherapy pharmacist for formal education over the next few days.   We will see him back in 2.5-3 weeks for evaluation, repeat blood work, and toxicity check   His creatinine is slightly elevated today. We will call him and instruct him to increase hydration. We will recheck his magnesium today to see if he needs to continue to take this.   The patient was advised to call immediately if he has any concerning symptoms in the interval. The patient voices understanding of current disease status and treatment options and is in agreement with the current care plan. All questions were answered. The patient knows to call the clinic with any problems, questions or concerns. We can certainly see the patient much sooner if necessary   No orders of the defined types were placed in this encounter.    Maricsa Sammons L Nekita Pita, PA-C 01/22/23  ADDENDUM: Hematology/Oncology Attending: I had a face-to-face encounter with the patient today.  I reviewed his record, lab and recommended his care plan.  This is a very pleasant 82 years old white male with metastatic renal cell carcinoma initially diagnosed as T3a papillary renal cell carcinoma with rhabdoid  features and then he had evidence for metastatic disease to the liver and bone in February 2024.  He is status post right open radical nephrectomy in June 2023 followed by 9 cycles of treatment with immunotherapy with Keytruda last dose was given May 21, 2022 before it was discontinued secondary to disease progression.  The patient then started systemic chemotherapy with nivolumab and Cabometyx initially at 40 mg p.o. daily but this was reduced to 20 mg p.o. daily before it was discontinued secondary to intolerance.  His treatment with nivolumab was discontinued after 7 cycles secondary to disease progression.  The patient was seen by Dr. Clarene Duke at [facility] center for a second opinion and she recommended for the patient treatment with lenvatinib and everolimus starting at a reduced dose. I discussed this treatment option with the patient as well as the adverse effects.  He is interested in proceeding with the treatment.  He has a starting dose will be lenvatinib 14 mg p.o. daily in addition to everolimus 5 mg p.o. daily.  He is expected to start this treatment in the next few days once it is approved by his insurance and the patient has the medication at home. We will see him back for follow-up visit in 2-3 weeks for evaluation and management of any adverse effect of this treatment. The patient was advised to call immediately if he has any other concerning symptoms in the interval. The total time spent in the appointment was 30 minutes. Disclaimer: This note was dictated with voice recognition software. Similar sounding words can inadvertently be transcribed and may be missed upon review. Lajuana Matte, MD

## 2023-01-27 ENCOUNTER — Telehealth: Payer: Self-pay | Admitting: Pharmacy Technician

## 2023-01-27 ENCOUNTER — Ambulatory Visit: Payer: PRIVATE HEALTH INSURANCE

## 2023-01-27 ENCOUNTER — Other Ambulatory Visit (HOSPITAL_COMMUNITY): Payer: Self-pay

## 2023-01-27 ENCOUNTER — Other Ambulatory Visit: Payer: Self-pay | Admitting: Pharmacy Technician

## 2023-01-27 ENCOUNTER — Telehealth: Payer: Self-pay

## 2023-01-27 ENCOUNTER — Inpatient Hospital Stay (HOSPITAL_BASED_OUTPATIENT_CLINIC_OR_DEPARTMENT_OTHER): Payer: Medicare Other | Admitting: Physician Assistant

## 2023-01-27 ENCOUNTER — Inpatient Hospital Stay: Payer: Medicare Other | Attending: Oncology

## 2023-01-27 DIAGNOSIS — C7951 Secondary malignant neoplasm of bone: Secondary | ICD-10-CM | POA: Insufficient documentation

## 2023-01-27 DIAGNOSIS — R7989 Other specified abnormal findings of blood chemistry: Secondary | ICD-10-CM | POA: Diagnosis not present

## 2023-01-27 DIAGNOSIS — C787 Secondary malignant neoplasm of liver and intrahepatic bile duct: Secondary | ICD-10-CM | POA: Diagnosis not present

## 2023-01-27 DIAGNOSIS — Z9221 Personal history of antineoplastic chemotherapy: Secondary | ICD-10-CM | POA: Insufficient documentation

## 2023-01-27 DIAGNOSIS — C641 Malignant neoplasm of right kidney, except renal pelvis: Secondary | ICD-10-CM | POA: Diagnosis present

## 2023-01-27 DIAGNOSIS — Z905 Acquired absence of kidney: Secondary | ICD-10-CM | POA: Insufficient documentation

## 2023-01-27 DIAGNOSIS — C7800 Secondary malignant neoplasm of unspecified lung: Secondary | ICD-10-CM | POA: Insufficient documentation

## 2023-01-27 DIAGNOSIS — C649 Malignant neoplasm of unspecified kidney, except renal pelvis: Secondary | ICD-10-CM

## 2023-01-27 LAB — CBC WITH DIFFERENTIAL (CANCER CENTER ONLY)
Abs Immature Granulocytes: 0.03 10*3/uL (ref 0.00–0.07)
Basophils Absolute: 0 10*3/uL (ref 0.0–0.1)
Basophils Relative: 1 %
Eosinophils Absolute: 0.1 10*3/uL (ref 0.0–0.5)
Eosinophils Relative: 2 %
HCT: 41.1 % (ref 39.0–52.0)
Hemoglobin: 13.3 g/dL (ref 13.0–17.0)
Immature Granulocytes: 1 %
Lymphocytes Relative: 16 %
Lymphs Abs: 0.8 10*3/uL (ref 0.7–4.0)
MCH: 33.1 pg (ref 26.0–34.0)
MCHC: 32.4 g/dL (ref 30.0–36.0)
MCV: 102.2 fL — ABNORMAL HIGH (ref 80.0–100.0)
Monocytes Absolute: 0.5 10*3/uL (ref 0.1–1.0)
Monocytes Relative: 11 %
Neutro Abs: 3.5 10*3/uL (ref 1.7–7.7)
Neutrophils Relative %: 69 %
Platelet Count: 157 10*3/uL (ref 150–400)
RBC: 4.02 MIL/uL — ABNORMAL LOW (ref 4.22–5.81)
RDW: 12 % (ref 11.5–15.5)
WBC Count: 5 10*3/uL (ref 4.0–10.5)
nRBC: 0 % (ref 0.0–0.2)

## 2023-01-27 LAB — CMP (CANCER CENTER ONLY)
ALT: 9 U/L (ref 0–44)
AST: 14 U/L — ABNORMAL LOW (ref 15–41)
Albumin: 4.1 g/dL (ref 3.5–5.0)
Alkaline Phosphatase: 87 U/L (ref 38–126)
Anion gap: 6 (ref 5–15)
BUN: 23 mg/dL (ref 8–23)
CO2: 29 mmol/L (ref 22–32)
Calcium: 9.7 mg/dL (ref 8.9–10.3)
Chloride: 105 mmol/L (ref 98–111)
Creatinine: 1.53 mg/dL — ABNORMAL HIGH (ref 0.61–1.24)
GFR, Estimated: 46 mL/min — ABNORMAL LOW (ref >60)
Glucose, Bld: 88 mg/dL (ref 70–99)
Potassium: 4.5 mmol/L (ref 3.5–5.1)
Sodium: 140 mmol/L (ref 135–145)
Total Bilirubin: 0.5 mg/dL (ref 0.3–1.2)
Total Protein: 6.8 g/dL (ref 6.5–8.1)

## 2023-01-27 LAB — MAGNESIUM: Magnesium: 2 mg/dL (ref 1.7–2.4)

## 2023-01-27 LAB — LACTATE DEHYDROGENASE: LDH: 164 U/L (ref 98–192)

## 2023-01-27 MED ORDER — LENVATINIB (14 MG DAILY DOSE) 10 & 4 MG PO CPPK
14.0000 mg | ORAL_CAPSULE | Freq: Every day | ORAL | 2 refills | Status: DC
  Filled 2023-01-28: qty 60, 30d supply, fill #0
  Filled 2023-02-19: qty 60, 30d supply, fill #1
  Filled 2023-03-23: qty 60, 30d supply, fill #2

## 2023-01-27 MED ORDER — EVEROLIMUS 5 MG PO TABS
5.0000 mg | ORAL_TABLET | Freq: Every day | ORAL | 2 refills | Status: DC
  Filled 2023-01-27 – 2023-01-28 (×2): qty 30, 30d supply, fill #0
  Filled 2023-02-19: qty 30, 30d supply, fill #1
  Filled 2023-03-23: qty 30, 30d supply, fill #2

## 2023-01-27 NOTE — Telephone Encounter (Addendum)
Oral Oncology Patient Advocate Encounter   Received notification that prior authorization for everolimus is required.   PA submitted on 01/27/23 Key [id number] Status is pending     [name], CPhT-Adv Oncology Pharmacy Patient Advocate [facility] Direct Number: [phone]  Fax: [phone]

## 2023-01-27 NOTE — Telephone Encounter (Signed)
TC to patient, instructing him to drink more water/fluids due to potential mild dehydration per C. Heilingoetter, PA. Also informed of normal Magnesium level and advised that he does not need to take Magnesium per PA. In case of the need for anti-emetics w/ upcoming treatment, confirmed that pt does not need refill of these; he states he has a sufficient amount of Zofran and Compazine at this time. Instructed to contact office if he begins running low and/or for any other probs or concerns. Pt verbalizes understanding.

## 2023-01-27 NOTE — Telephone Encounter (Signed)
Oral Oncology Pharmacist Encounter  Received new prescription for Lenvima (lenvatinib) in combination with Afinitor (everolimus) for the treatment of metastatic papillary renal cell carcinoma, planned duration until disease progression or unacceptable toxicity.  Labs from 01/27/23 (CBC, CMP) and 12/30/22 (TSH) assessed, patients creatinine on 01/27/23 is elevated to 1.53 resulting in a CrCl of 35.77. No dose adjustments needed for afinitor and dose adjustments are only needed for lenvima if CrCl is <30.  Prescriptions have not been entered yet.  Current medication list in Epic reviewed, no significant DDIs with Afinitor or Lenvima identified.   Evaluated chart and no patient barriers to medication adherence noted.   Patient agreement for treatment documented in PA note on 01/27/23.  Prescription has been e-scribed to the [facility] for benefits analysis and approval.  Oral Oncology Clinic will continue to follow for insurance authorization, copayment issues, initial counseling and start date.  Bethel Born, PharmD Hematology/Oncology Clinical Pharmacist [facility] Oral Chemotherapy Navigation Clinic [phone] 01/27/2023 11:41 AM

## 2023-01-27 NOTE — Telephone Encounter (Signed)
Oral Oncology Patient Advocate Encounter   Received notification that prior authorization for Lenvima is required.   PA submitted on 01/27/23 Key [id number] Status is pending     [name], CPhT-Adv Oncology Pharmacy Patient Advocate [facility] Direct Number: [phone]  Fax: [phone]

## 2023-01-27 NOTE — Telephone Encounter (Signed)
Oral Oncology Patient Advocate Encounter  Prior Authorization for Micheal Mcintyre has been approved.    PA# [id number] Effective dates: 12/28/22 through 01/27/24  Patients co-pay is $0.    [name], CPhT-Adv Oncology Pharmacy Patient Advocate [facility] Direct Number: [phone]  Fax: [phone]

## 2023-01-27 NOTE — Telephone Encounter (Signed)
Oral Oncology Patient Advocate Encounter  Prior Authorization for everolimus has been approved.    PA# [id number] Effective dates: 12/28/22 through 01/27/24  Patients co-pay is $0.    [name], CPhT-Adv Oncology Pharmacy Patient Advocate [facility] Direct Number: [phone]  Fax: [phone]

## 2023-01-28 ENCOUNTER — Other Ambulatory Visit: Payer: Self-pay

## 2023-01-28 ENCOUNTER — Other Ambulatory Visit (HOSPITAL_COMMUNITY): Payer: Self-pay

## 2023-01-28 NOTE — Progress Notes (Signed)
Specialty Pharmacy Initial Fill Coordination Note  Micheal Mcintyre is a 82 y.o. male contacted today regarding refills of specialty medication(s) Everolimus (Antineoplastic Enzyme Inhibitors); Lenvatinib Mesylate .  Patient requested Delivery  on 02/01/23  to verified address [address] [place] [place] [address]   Medication will be filled on 01/28/23.   Patient is aware of $0 copayment.

## 2023-01-28 NOTE — Telephone Encounter (Signed)
Oral Chemotherapy Pharmacist Encounter  I spoke with patient for overview of: Lenvima (lenvatinib) and Afinitor (everolimus) for the treatment of metastatic papillary renal cell carcinoma. Planned duration until disease progression or unacceptable toxicity. Prescription dose and frequency assessed for appropriateness.   Counseled patient on administration, dosing, side effects, monitoring, drug-food interactions, safe handling, storage, and disposal.  Patient will take Lenvima one 10mg  capsule and one 4mg  capsule (total of 14mg  total daily) by mouth once daily, with or without food, at approximately the same time each day in combination with Afinitor 5mg  tablets, 1 tablet by mouth once daily, with water, without regard to food. Patient understands to take Afinitor consistently with regards to food and at approximately the same time each day.  Patient knows to aviod grapefruit or grapefruit juice while on therapy with Afinitor.  Afinitor and lenvima start date: 02/02/2023  Adverse effects of Afinitor include but are not limited to: mouth sores, GI upset, nausea, diarrhea, constipation, rash, increased blood sugars, decreased blood counts, increased blood pressure, pulmonary toxicity, and edema.   Adverse effects of Lenvima include but are not limited to: hypertension, hand-foot syndrome, diarrhea, joint pain, fatigue, headache, decreased calcium, proteinuria, increased risk of blood clots, and cardiac conduction issues.   Patient will obtain anti diarrheal and alert the office of 4 or more loose stools above baseline.  Patient instructed to notify office of any upcoming invasive procedures.  Assunta Curtis will be held for 6 days prior to scheduled surgery, restart based on healing and clinical judgement.   We discussed appropriate use of mouthwash and to notify me or MD if mouthsores occur as it is a lower dose of afinitor for this disease state.  Reviewed with patient importance of keeping a  medication schedule and plan for any missed doses. No barriers to medication adherence identified.  Medication reconciliation performed and medication/allergy list updated.  Insurance authorization for [employer] and Assunta Curtis has been obtained. Test claim at the pharmacy revealed copayment $0 for 1st fill of 30 days (patient has grant). This will ship from the [facility] outpatient pharmacy on 01/29/23 to deliver to patient's home on 02/01/23.  Patient informed the pharmacy will reach out 5-7 days prior to needing next fill of Afinitor to coordinate continued medication acquisition to prevent break in therapy.  All questions answered.  Patient voiced understanding and appreciation.   Medication education handout placed in mail for patient. Patient knows to call the office with questions or concerns. Oral Chemotherapy Clinic phone number provided to patient.   Bethel Born, PharmD Hematology/Oncology Clinical Pharmacist [facility] Oral Chemotherapy Navigation Clinic [phone] 01/28/2023 10:42 AM

## 2023-01-28 NOTE — Progress Notes (Signed)
Oral Oncology Pharmacist Encounter  Patient counseling performed during encounter from 01/27/2023.  Micheal Mcintyre, PharmD Hematology/Oncology Clinical Pharmacist [facility] Oral Chemotherapy Navigation Clinic [phone]

## 2023-02-15 NOTE — Progress Notes (Unsigned)
[facility] OFFICE PROGRESS NOTE  [name], MD 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   PRIOR THERAPY: ) Status post right open radical nephrectomy completed on October 15, 2021 by Dr. Jettie Pagan.  2) Keytruda 200 Mg IV every 3 weeks started December 03, 2021.  Status post 9 cycles.  Last dose was given May 21, 2022.  This was discontinued secondary to disease progression. 3) Systemic treatment with nivolumab 480 Mg IV every 4 weeks in addition to Cabometyx 40 mg p.o. daily.  First dose June 18, 2022.  Status post 7 cycles.  His Cabometyx has been on hold recently due to adverse side effects. His dose was reduced in May to 20 mg p.o. daily but he continues to have weakness, diarrhea, and dehydration. This has been on hold since 5/19.  Last dose was given December 02, 2022 discontinued secondary to disease progression  CURRENT THERAPY: Starting 14 mg of  lenvatinib plus everolimus 5 mg on 01/28/23  INTERVAL HISTORY: Micheal Mcintyre 82 y.o. male returns to clinic today for follow-up visit.  The patient was last seen by Dr. Arbutus Ped and myself on 01/27/2023.  His scan performed in August showed some increased size of pulmonary nodules in addition to increasing size of the hepatic metastatic disease.  Therefore the patient saw Dr. Clarene Duke at [facility] on 01/14/2023.  They recommended treatment with  lenvatinib plus everolimus 5 milligrams people the patient's dose of lenvatinib was reduced due to prior intolerance to Cabometyx.  He started his treatment on ***and thus far has been tolerating it ***.   Today he denies any fever, chills, night sweats, or weight loss.  Increased swelling?  Fatigue?  Denies any rashes or skin changes.  Hypertension?  Joint pain?  Proteinuria?  Signs and  symptoms of blood clot? Mouth sores.   Hypertension, hand-foot syndrome, diarrhea, joint pain, fatigue, headaches, decreased calcium, proteinuria, increased risk of blood clots, and cardiac conduction issues.  Rash, elevated blood sugar, pulmonary toxicity, edema, mouth sores. He stopped marinol and remeron due to too much drowsiness. His appetite is ***.  He is exercising at the pool.  Denies any chest pain, shortness of breath, cough, or hemoptysis.  Denies any nausea, vomiting, diarrhea, or constipation.  He is here today for evaluation and repeat blood work.   MEDICAL HISTORY: Past Medical History:  Diagnosis Date   Arthritis    GERD (gastroesophageal reflux disease)    Hypertension    Medicare annual wellness visit, subsequent 08/19/2014   Osteoarthritis 09/21/2007   Qualifier: Diagnosis of  By: Alphonzo Severance MD, Ivar Drape R    PONV (postoperative nausea and vomiting)    Preventative health care 06/07/2016   Right shoulder pain 12/03/2016    ALLERGIES:  is allergic to statins.  MEDICATIONS:  Current Outpatient Medications  Medication Sig Dispense Refill   acetaminophen (TYLENOL) 500 MG tablet Take 1,000 mg by mouth every 6 (six) hours as needed for moderate pain.     aluminum hydroxide-magnesium carbonate (GAVISCON) 95-358 MG/15ML SUSP Take 15 mLs by mouth as needed for heartburn or indigestion.     Cholecalciferol (VITAMIN D3) 10 MCG (400 UNIT) tablet Take 800 Units by mouth daily.     everolimus (AFINITOR) 5 MG tablet Take 1 tablet (5 mg total) by mouth  daily. 30 tablet 2   lenvatinib 14 mg daily dose (LENVIMA) 10 & 4 MG capsule Take 14 mg by mouth daily. (Take one 10mg  capsule and one 4mg  capsule for a total dose of 14mg ) 60 capsule 2   levothyroxine (SYNTHROID) 50 MCG tablet TAKE 1 TABLET(50 MCG) BY MOUTH DAILY BEFORE BREAKFAST 30 tablet 1   magnesium chloride (SLOW-MAG) 64 MG TBEC SR tablet Take 1 tablet (64 mg total) by mouth 2 (two) times daily. 60 tablet 0   Multiple Vitamin  (MULTIVITAMIN WITH MINERALS) TABS tablet Take 1 tablet by mouth 4 (four) times a week.     rosuvastatin (CRESTOR) 20 MG tablet Take 0.5 tablets (10 mg total) by mouth daily. (Patient not taking: Reported on 01/27/2023) 90 tablet 3   telmisartan (MICARDIS) 80 MG tablet TAKE 1/2 TABLET BY MOUTH TWICE DAILY 180 tablet 1   TURMERIC PO Take 1 tablet by mouth 3 (three) times a week.     Vitamin D, Ergocalciferol, (DRISDOL) 1.25 MG (50000 UNIT) CAPS capsule Take 1 capsule (50,000 Units total) by mouth every 7 (seven) days. 4 capsule 4   No current facility-administered medications for this visit.    SURGICAL HISTORY:  Past Surgical History:  Procedure Laterality Date   CHOLECYSTECTOMY     HIP SURGERY     JOINT REPLACEMENT     NEPHRECTOMY Right 10/15/2021   Procedure: NEPHRECTOMY, OPEN;  Surgeon: Jannifer Hick, MD;  Location: [facility];  Service: Urology;  Laterality: Right;   nose skin cancer removal     TOTAL HIP ARTHROPLASTY      REVIEW OF SYSTEMS:   Review of Systems  Constitutional: Negative for appetite change, chills, fatigue, fever and unexpected weight change.  HENT:   Negative for mouth sores, nosebleeds, sore throat and trouble swallowing.   Eyes: Negative for eye problems and icterus.  Respiratory: Negative for cough, hemoptysis, shortness of breath and wheezing.   Cardiovascular: Negative for chest pain and leg swelling.  Gastrointestinal: Negative for abdominal pain, constipation, diarrhea, nausea and vomiting.  Genitourinary: Negative for bladder incontinence, difficulty urinating, dysuria, frequency and hematuria.   Musculoskeletal: Negative for back pain, gait problem, neck pain and neck stiffness.  Skin: Negative for itching and rash.  Neurological: Negative for dizziness, extremity weakness, gait problem, headaches, light-headedness and seizures.  Hematological: Negative for adenopathy. Does not bruise/bleed easily.  Psychiatric/Behavioral: Negative for confusion, depression  and sleep disturbance. The patient is not nervous/anxious.     PHYSICAL EXAMINATION:  There were no vitals taken for this visit.  ECOG PERFORMANCE STATUS: {CHL ONC ECOG [id number]  Physical Exam  Constitutional: Oriented to person, place, and time and well-developed, well-nourished, and in no distress. No distress.  HENT:  Head: Normocephalic and atraumatic.  Mouth/Throat: Oropharynx is clear and moist. No oropharyngeal exudate.  Eyes: Conjunctivae are normal. Right eye exhibits no discharge. Left eye exhibits no discharge. No scleral icterus.  Neck: Normal range of motion. Neck supple.  Cardiovascular: Normal rate, regular rhythm, normal heart sounds and intact distal pulses.   Pulmonary/Chest: Effort normal and breath sounds normal. No respiratory distress. No wheezes. No rales.  Abdominal: Soft. Bowel sounds are normal. Exhibits no distension and no mass. There is no tenderness.  Musculoskeletal: Normal range of motion. Exhibits no edema.  Lymphadenopathy:    No cervical adenopathy.  Neurological: Alert and oriented to person, place, and time. Exhibits normal muscle tone. Gait normal. Coordination normal.  Skin: Skin is warm and dry. No rash noted. Not diaphoretic. No  erythema. No pallor.  Psychiatric: Mood, memory and judgment normal.  Vitals reviewed.  LABORATORY DATA: Lab Results  Component Value Date   WBC 5.0 01/27/2023   HGB 13.3 01/27/2023   HCT 41.1 01/27/2023   MCV 102.2 (H) 01/27/2023   PLT 157 01/27/2023      Chemistry      Component Value Date/Time   NA 140 01/27/2023 1026   NA 140 04/22/2022 0000   K 4.5 01/27/2023 1026   CL 105 01/27/2023 1026   CO2 29 01/27/2023 1026   BUN 23 01/27/2023 1026   BUN 21 04/22/2022 0000   CREATININE 1.53 (H) 01/27/2023 1026   CREATININE 1.09 02/06/2020 1306   GLU 88 04/22/2022 0000      Component Value Date/Time   CALCIUM 9.7 01/27/2023 1026   ALKPHOS 87 01/27/2023 1026   AST 14 (L) 01/27/2023 1026   ALT 9  01/27/2023 1026   BILITOT 0.5 01/27/2023 1026       RADIOGRAPHIC STUDIES:  No results found.   ASSESSMENT/PLAN:  This is a very pleasant 82 year old Caucasian male diagnosed with metastatic papillary renal cell carcinoma.  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  This was diagnosed in June 2023.  He is status post a right open radical nephrectomy.  Patient was found to have metastatic disease to the liver and bone in February 2024.   The patient had previously underwent adjuvant treatment with Keytruda 20 mg IV every 3 weeks for 9 cycles.   When he saw Dr. Arbutus Ped on 06/11/2022 and reviewed his MRI with him which showed multiple new rim-enhancing lesions throughout the liver consistent metastatic disease.  There was also a rim-enhancing lesion in the inferior endplate of L1 measuring 2.1 x 1.1 cm concerning for metastatic disease.  There is no local recurrence or lymphadenopathy. He also had a chest CT by Dr. Cardell Peach which showed pulmonary metastatic disease.    Dr. Arbutus Ped recommended that the patient start treatment with nivolumab 480 mg IV every 4 weeks in addition to Cabometyx 40 mg grams p.o. daily.  He started this on 06/18/2022. He is status post 7 cycles of nivolumab.  He then had GI side effects of cabometyx with diarrhea and weight loss. His treatment has periodically been on hold. His dose was reduced to 20 mg in May 2024, but he continued to have intolerance. Therefore, this has been on hold since 5/19. His diarrhea and weight loss have since resolved.    Unfortunately, the patient's restaging CT scan from August showed disease progression.   The patient saw Dr. Clarene Duke at [facility] for second opinion.  She recommended lenvatinib plus everolimus.  She recommended starting at a small dose reduction of 14 mg in standard dose everolimus standard dose 5 mg given his prior issues with cabo. If tolerates very well, could  increase lenvatinib.  He had progression in the future, then she would recommend clinical trial if available versus axitinib. Therefore, he started this on 01/28/23 and tolerated it ***.   The patient was seen with Dr. Arbutus Ped today.  Labs were reviewed.  Recommend that he *** with *** today as scheduled.    ***rechecked mag to see if he still needs this. Check creatinine.   The patient was advised to call immediately if she has any concerning symptoms in the interval. The patient voices understanding of current disease status and treatment options and is in agreement with the current care  plan. All questions were answered. The patient knows to call the clinic with any problems, questions or concerns. We can certainly see the patient much sooner if necessary       No orders of the defined types were placed in this encounter.    I spent {CHL ONC TIME VISIT - VHQIO:[id number]} counseling the patient face to face. The total time spent in the appointment was {CHL ONC TIME VISIT - KGMWN:[id number]}.  Novah Goza L Maricia Scotti, PA-C 02/15/23

## 2023-02-17 ENCOUNTER — Other Ambulatory Visit: Payer: Self-pay | Admitting: Physician Assistant

## 2023-02-17 ENCOUNTER — Other Ambulatory Visit: Payer: Self-pay

## 2023-02-17 DIAGNOSIS — C649 Malignant neoplasm of unspecified kidney, except renal pelvis: Secondary | ICD-10-CM

## 2023-02-18 ENCOUNTER — Inpatient Hospital Stay: Payer: Medicare Other | Admitting: Physician Assistant

## 2023-02-18 ENCOUNTER — Inpatient Hospital Stay: Payer: Medicare Other | Attending: Oncology

## 2023-02-18 VITALS — BP 155/81 | HR 69 | Temp 97.6°F | Resp 19 | Wt 141.9 lb

## 2023-02-18 DIAGNOSIS — C649 Malignant neoplasm of unspecified kidney, except renal pelvis: Secondary | ICD-10-CM | POA: Diagnosis not present

## 2023-02-18 DIAGNOSIS — C641 Malignant neoplasm of right kidney, except renal pelvis: Secondary | ICD-10-CM | POA: Insufficient documentation

## 2023-02-18 DIAGNOSIS — C7951 Secondary malignant neoplasm of bone: Secondary | ICD-10-CM | POA: Insufficient documentation

## 2023-02-18 DIAGNOSIS — C787 Secondary malignant neoplasm of liver and intrahepatic bile duct: Secondary | ICD-10-CM | POA: Insufficient documentation

## 2023-02-18 DIAGNOSIS — I10 Essential (primary) hypertension: Secondary | ICD-10-CM | POA: Insufficient documentation

## 2023-02-18 DIAGNOSIS — K1379 Other lesions of oral mucosa: Secondary | ICD-10-CM

## 2023-02-18 DIAGNOSIS — Z905 Acquired absence of kidney: Secondary | ICD-10-CM | POA: Insufficient documentation

## 2023-02-18 DIAGNOSIS — C7800 Secondary malignant neoplasm of unspecified lung: Secondary | ICD-10-CM | POA: Insufficient documentation

## 2023-02-18 DIAGNOSIS — R112 Nausea with vomiting, unspecified: Secondary | ICD-10-CM | POA: Diagnosis not present

## 2023-02-18 DIAGNOSIS — D696 Thrombocytopenia, unspecified: Secondary | ICD-10-CM | POA: Insufficient documentation

## 2023-02-18 LAB — CBC WITH DIFFERENTIAL (CANCER CENTER ONLY)
Abs Immature Granulocytes: 0.01 10*3/uL (ref 0.00–0.07)
Basophils Absolute: 0 10*3/uL (ref 0.0–0.1)
Basophils Relative: 1 %
Eosinophils Absolute: 0.1 10*3/uL (ref 0.0–0.5)
Eosinophils Relative: 3 %
HCT: 44.9 % (ref 39.0–52.0)
Hemoglobin: 15.4 g/dL (ref 13.0–17.0)
Immature Granulocytes: 0 %
Lymphocytes Relative: 21 %
Lymphs Abs: 0.9 10*3/uL (ref 0.7–4.0)
MCH: 32.1 pg (ref 26.0–34.0)
MCHC: 34.3 g/dL (ref 30.0–36.0)
MCV: 93.5 fL (ref 80.0–100.0)
Monocytes Absolute: 0.4 10*3/uL (ref 0.1–1.0)
Monocytes Relative: 9 %
Neutro Abs: 2.9 10*3/uL (ref 1.7–7.7)
Neutrophils Relative %: 66 %
Platelet Count: 57 10*3/uL — ABNORMAL LOW (ref 150–400)
RBC: 4.8 MIL/uL (ref 4.22–5.81)
RDW: 11.6 % (ref 11.5–15.5)
WBC Count: 4.3 10*3/uL (ref 4.0–10.5)
nRBC: 0 % (ref 0.0–0.2)

## 2023-02-18 LAB — CMP (CANCER CENTER ONLY)
ALT: 23 U/L (ref 0–44)
AST: 27 U/L (ref 15–41)
Albumin: 4 g/dL (ref 3.5–5.0)
Alkaline Phosphatase: 95 U/L (ref 38–126)
Anion gap: 8 (ref 5–15)
BUN: 25 mg/dL — ABNORMAL HIGH (ref 8–23)
CO2: 28 mmol/L (ref 22–32)
Calcium: 9.3 mg/dL (ref 8.9–10.3)
Chloride: 97 mmol/L — ABNORMAL LOW (ref 98–111)
Creatinine: 1.61 mg/dL — ABNORMAL HIGH (ref 0.61–1.24)
GFR, Estimated: 43 mL/min — ABNORMAL LOW (ref >60)
Glucose, Bld: 119 mg/dL — ABNORMAL HIGH (ref 70–99)
Potassium: 4.3 mmol/L (ref 3.5–5.1)
Sodium: 133 mmol/L — ABNORMAL LOW (ref 135–145)
Total Bilirubin: 0.8 mg/dL (ref 0.3–1.2)
Total Protein: 7.2 g/dL (ref 6.5–8.1)

## 2023-02-18 MED ORDER — ONDANSETRON HCL 8 MG PO TABS
8.0000 mg | ORAL_TABLET | Freq: Three times a day (TID) | ORAL | 2 refills | Status: DC | PRN
Start: 2023-02-18 — End: 2023-07-15

## 2023-02-18 MED ORDER — MAGIC MOUTHWASH
5.0000 mL | Freq: Three times a day (TID) | ORAL | 0 refills | Status: DC | PRN
Start: 2023-02-18 — End: 2023-08-17

## 2023-02-18 NOTE — Patient Instructions (Signed)
It was nice seeing you today.  I know we covered a lot of important information.  Here is a summary of the main take away points.  -Please monitor your blood pressure closely at home.  Please keep a log of your readings.  As long as your blood pressure is not going too low, you may need to consider taking the second blood pressure medication that your nephrologist gave you. -The sensitivities on your hand and feet may be secondary to the treatment with Lenvima.  Clothing and shoes.  Avoid any hot showers.  You may want to consider soaking her feet in cold water 2030 minutes/day.  Please also use urea cream 3 times a day. -I sent you a prescription for Magic mouthwash for mouth sores.  You can also use Biotene. -Your platelet count is low.  Dr. Arbutus Ped would like to keep you on the same doses for now with close monitoring.  He will have a lab only visit next week.  And we will also check your labs again with an office visit in 2 weeks.  If you have any increased bleeding, please call us and let us know as we would like to bring you in for another lab visit.  For your nose, please avoid forceful blowing and use saline spray in your nose.  If you ever have any significant bleeding, then seek emergency evaluation. -Please be very conscientious to hydrate well with water.  Your kidney function is slightly reduced today.

## 2023-02-19 ENCOUNTER — Other Ambulatory Visit: Payer: Self-pay

## 2023-02-19 ENCOUNTER — Other Ambulatory Visit (HOSPITAL_COMMUNITY): Payer: Self-pay

## 2023-02-19 NOTE — Progress Notes (Signed)
Specialty Pharmacy Refill Coordination Note  Micheal Mcintyre is a 82 y.o. male contacted today regarding refills of specialty medication(s) Everolimus (Antineoplastic Enzyme Inhibitors); Lenvatinib Mesylate   Patient requested Delivery   Delivery date: 02/25/23   Verified address: [address] [place] [address]   Medication will be filled on 02/23/22.

## 2023-02-22 ENCOUNTER — Telehealth: Payer: Self-pay | Admitting: *Deleted

## 2023-02-22 ENCOUNTER — Other Ambulatory Visit: Payer: Self-pay

## 2023-02-22 NOTE — Telephone Encounter (Signed)
Micheal Mcintyre discussed a toothache with Micheal Mcintyre last week. He went to the dentist and he needs a root canal. It is scheduled for 10/30. They want to know if this is OK to proceed with his hx low platelets? Has lab appt tomorrow.

## 2023-02-22 NOTE — Progress Notes (Signed)
Specialty Pharmacy Ongoing Clinical Assessment Note  Micheal Mcintyre is a 82 y.o. male who is being followed by the specialty pharmacy service for RxSp Oncology   Patient's specialty medication(s) reviewed today: Everolimus (Antineoplastic Enzyme Inhibitors); Lenvatinib Mesylate   Missed doses in the last 4 weeks: 0   Patient/Caregiver did not have any additional questions or concerns.   Therapeutic benefit summary: Unable to assess   Adverse events/side effects summary: Experienced adverse events/side effects (using Eucerin for skin peeling & mouth rinse for mouth sores)   Patient's therapy is appropriate to: Continue    Goals Addressed             This Visit's Progress    Slow Disease Progression       Patient is on track. Patient will maintain adherence         Follow up:  3 months  Bobette Mo Specialty Pharmacist

## 2023-02-23 ENCOUNTER — Other Ambulatory Visit: Payer: Self-pay

## 2023-02-23 ENCOUNTER — Inpatient Hospital Stay: Payer: Medicare Other

## 2023-02-23 ENCOUNTER — Telehealth: Payer: Self-pay

## 2023-02-23 ENCOUNTER — Other Ambulatory Visit: Payer: Self-pay | Admitting: Medical Oncology

## 2023-02-23 ENCOUNTER — Encounter: Payer: Self-pay | Admitting: Internal Medicine

## 2023-02-23 ENCOUNTER — Encounter: Payer: Self-pay | Admitting: Medical Oncology

## 2023-02-23 DIAGNOSIS — C641 Malignant neoplasm of right kidney, except renal pelvis: Secondary | ICD-10-CM | POA: Diagnosis not present

## 2023-02-23 DIAGNOSIS — C649 Malignant neoplasm of unspecified kidney, except renal pelvis: Secondary | ICD-10-CM

## 2023-02-23 LAB — CMP (CANCER CENTER ONLY)
ALT: 49 U/L — ABNORMAL HIGH (ref 0–44)
AST: 56 U/L — ABNORMAL HIGH (ref 15–41)
Albumin: 4 g/dL (ref 3.5–5.0)
Alkaline Phosphatase: 97 U/L (ref 38–126)
Anion gap: 6 (ref 5–15)
BUN: 20 mg/dL (ref 8–23)
CO2: 29 mmol/L (ref 22–32)
Calcium: 9.3 mg/dL (ref 8.9–10.3)
Chloride: 94 mmol/L — ABNORMAL LOW (ref 98–111)
Creatinine: 1.48 mg/dL — ABNORMAL HIGH (ref 0.61–1.24)
GFR, Estimated: 48 mL/min — ABNORMAL LOW (ref >60)
Glucose, Bld: 102 mg/dL — ABNORMAL HIGH (ref 70–99)
Potassium: 5 mmol/L (ref 3.5–5.1)
Sodium: 129 mmol/L — ABNORMAL LOW (ref 135–145)
Total Bilirubin: 0.8 mg/dL (ref 0.3–1.2)
Total Protein: 7 g/dL (ref 6.5–8.1)

## 2023-02-23 LAB — CBC WITH DIFFERENTIAL (CANCER CENTER ONLY)
Abs Immature Granulocytes: 0.01 10*3/uL (ref 0.00–0.07)
Basophils Absolute: 0 10*3/uL (ref 0.0–0.1)
Basophils Relative: 1 %
Eosinophils Absolute: 0.1 10*3/uL (ref 0.0–0.5)
Eosinophils Relative: 2 %
HCT: 42.2 % (ref 39.0–52.0)
Hemoglobin: 14.7 g/dL (ref 13.0–17.0)
Immature Granulocytes: 0 %
Lymphocytes Relative: 22 %
Lymphs Abs: 0.7 10*3/uL (ref 0.7–4.0)
MCH: 31.7 pg (ref 26.0–34.0)
MCHC: 34.8 g/dL (ref 30.0–36.0)
MCV: 90.9 fL (ref 80.0–100.0)
Monocytes Absolute: 0.4 10*3/uL (ref 0.1–1.0)
Monocytes Relative: 12 %
Neutro Abs: 2 10*3/uL (ref 1.7–7.7)
Neutrophils Relative %: 63 %
Platelet Count: 48 10*3/uL — ABNORMAL LOW (ref 150–400)
RBC: 4.64 MIL/uL (ref 4.22–5.81)
RDW: 11.7 % (ref 11.5–15.5)
WBC Count: 3.2 10*3/uL — ABNORMAL LOW (ref 4.0–10.5)
nRBC: 0 % (ref 0.0–0.2)

## 2023-02-23 NOTE — Telephone Encounter (Signed)
Pt scheduled for a root canal on 10/30. Per Cassie, PA labs need to be done on 10/29 to check plt count before procedure. Lab appt made for 10/29 @ 11 am.   Pt informed to stop chemo pills today and resume 2-3 days after root canal. Pt verbalized understanding and asked for me to send a mychart message as well.

## 2023-02-24 ENCOUNTER — Telehealth: Payer: Self-pay | Admitting: Medical Oncology

## 2023-02-24 NOTE — Telephone Encounter (Signed)
Faxed dental clearance form.

## 2023-03-01 NOTE — Progress Notes (Unsigned)
[facility] OFFICE PROGRESS NOTE  [name], MD 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   PRIOR THERAPY: 1) Status post right open radical nephrectomy completed on October 15, 2021 by Dr. Jettie Pagan.  2) Keytruda 200 Mg IV every 3 weeks started December 03, 2021.  Status post 9 cycles.  Last dose was given May 21, 2022.  This was discontinued secondary to disease progression. 3) Systemic treatment with nivolumab 480 Mg IV every 4 weeks in addition to Cabometyx 40 mg p.o. daily.  First dose June 18, 2022.  Status post 7 cycles.  His Cabometyx has been on hold recently due to adverse side effects. His dose was reduced in May to 20 mg p.o. daily but he continues to have weakness, diarrhea, and dehydration. This has been on hold since 5/19.  Last dose was given December 02, 2022 discontinued secondary to disease progression  CURRENT THERAPY: Starting 14 mg of  lenvatinib plus everolimus 5 mg on 02/02/23   INTERVAL HISTORY: Micheal Mcintyre 82 y.o. male returns to the clinic today for a follow up visit accompanied by his wife. When the patient was seen Dr. Arbutus Ped and myself on 01/27/2023.  His scan performed in August showed some increased size of pulmonary nodules in addition to increasing size of the hepatic metastatic disease.  Therefore, the patient saw Dr. Clarene Duke at [facility] on 01/14/2023.  They recommended treatment with lenvatinib plus everolimus 5 milligrams. The patient's dose of lenvatinib was reduced due to prior intolerance to Cabometyx.  He started his treatment on 02/02/23 and thus far has been tolerating it fair except his labs showed worsening thrombocytopenia at this last appointment. He also had some skin sensitivity around the soles of his feet and mouth sores. He  also had some hypertension and started back taking norvasc at night in addition to telmistartan if his BP was not at goal.   He had a root canal earlier this week. Therefore, his treatment was held for the last week. We would recommend resuming 2-3 days after his extraction as long as his labs permit. He has an antibiotic packing/plate in his mouth which they are going to take out ~11/19 for his next dental follow up.  Since last being seen, his mouth sores are not that bad. He has not needed to use magic mouthwash.   He also had some findings suggestive of mild hand foot syndrome. He was advised to use urea creams. This helps somewhat. He does have some calluses on the bottom of his feet. I previously told him to be careful using treatments for this that could cause breaks in the skin and introduce infection. He feels like his taste buds are different. His weight is fairly stable.   He had some epistaxis last week which has since subsided. He uses saline spray.   Today, he is feeling fair. He denies any fever, night sweats, or chills. Denies any hemoptysis, hematemesis, melena, or hematochezia. Denies any changes with his breathing. He has had some mild nausea after eating taken 2 nausea pill total. without any vomiting, diarrhea not really only one day and took lomotl and worked. He is here today for evaluation repeat blood work.   MEDICAL HISTORY: Past Medical History:  Diagnosis Date   Arthritis  GERD (gastroesophageal reflux disease)    Hypertension    Medicare annual wellness visit, subsequent 08/19/2014   Osteoarthritis 09/21/2007   Qualifier: Diagnosis of  By: Alphonzo Severance MD, Loni Dolly    PONV (postoperative nausea and vomiting)    Preventative health care 06/07/2016   Right shoulder pain 12/03/2016    ALLERGIES:  is allergic to statins.  MEDICATIONS:  Current Outpatient Medications  Medication Sig Dispense Refill   acetaminophen (TYLENOL) 500 MG tablet Take 1,000 mg by mouth  every 6 (six) hours as needed for moderate pain.     Cholecalciferol (VITAMIN D3) 10 MCG (400 UNIT) tablet Take 800 Units by mouth daily.     everolimus (AFINITOR) 5 MG tablet Take 1 tablet (5 mg total) by mouth daily. 30 tablet 2   lenvatinib 14 mg daily dose (LENVIMA) 10 & 4 MG capsule Take 14 mg by mouth daily. (Take one 10mg  capsule and one 4mg  capsule for a total dose of 14mg ) 60 capsule 2   levothyroxine (SYNTHROID) 50 MCG tablet TAKE 1 TABLET(50 MCG) BY MOUTH DAILY BEFORE BREAKFAST 30 tablet 1   magic mouthwash SOLN Take 5 mLs by mouth 3 (three) times daily as needed for mouth pain. Suspension contains equal amounts of Maalox Extra Strength, nystatin, and diphenhydramine. 240 mL 0   Multiple Vitamin (MULTIVITAMIN WITH MINERALS) TABS tablet Take 1 tablet by mouth 4 (four) times a week.     ondansetron (ZOFRAN) 8 MG tablet Take 1 tablet (8 mg total) by mouth every 8 (eight) hours as needed for nausea or vomiting. 30 tablet 2   telmisartan (MICARDIS) 80 MG tablet TAKE 1/2 TABLET BY MOUTH TWICE DAILY 180 tablet 1   aluminum hydroxide-magnesium carbonate (GAVISCON) 95-358 MG/15ML SUSP Take 15 mLs by mouth as needed for heartburn or indigestion. (Patient not taking: Reported on 03/04/2023)     amoxicillin-clavulanate (AUGMENTIN) 500-125 MG tablet Take 1 tablet by mouth every 8 (eight) hours. (Patient not taking: Reported on 03/04/2023)     magnesium chloride (SLOW-MAG) 64 MG TBEC SR tablet Take 1 tablet (64 mg total) by mouth 2 (two) times daily. (Patient not taking: Reported on 03/04/2023) 60 tablet 0   rosuvastatin (CRESTOR) 20 MG tablet Take 0.5 tablets (10 mg total) by mouth daily. (Patient not taking: Reported on 01/27/2023) 90 tablet 3   TURMERIC PO Take 1 tablet by mouth 3 (three) times a week. (Patient not taking: Reported on 03/04/2023)     Vitamin D, Ergocalciferol, (DRISDOL) 1.25 MG (50000 UNIT) CAPS capsule Take 1 capsule (50,000 Units total) by mouth every 7 (seven) days. (Patient not  taking: Reported on 03/04/2023) 4 capsule 4   No current facility-administered medications for this visit.    SURGICAL HISTORY:  Past Surgical History:  Procedure Laterality Date   CHOLECYSTECTOMY     HIP SURGERY     JOINT REPLACEMENT     NEPHRECTOMY Right 10/15/2021   Procedure: NEPHRECTOMY, OPEN;  Surgeon: Jannifer Hick, MD;  Location: [facility];  Service: Urology;  Laterality: Right;   nose skin cancer removal     TOTAL HIP ARTHROPLASTY      REVIEW OF SYSTEMS:   Review of Systems  Constitutional: Positive for stable fatigue. Negative for appetite change, chills,  fever and unexpected weight change.  HENT: Positive for resolved nosebleeding and improving mouth soreness. Eyes: Negative for eye problems and icterus.  Respiratory: Negative for cough, hemoptysis, shortness of breath and wheezing.   Cardiovascular: Negative for chest pain and leg swelling.  Gastrointestinal: Negative for abdominal pain, constipation, diarrhea, nausea and vomiting.  Genitourinary: Negative for bladder incontinence, difficulty urinating, dysuria, frequency and hematuria.   Musculoskeletal: Negative for back pain, gait problem, neck pain and neck stiffness.  Skin: Negative for itching and rash.  Neurological: Negative for dizziness, extremity weakness, gait problem, headaches, light-headedness and seizures.  Hematological: Negative for adenopathy. Does not bruise/bleed easily.  Psychiatric/Behavioral: Negative for confusion, depression and sleep disturbance. The patient is not nervous/anxious.     PHYSICAL EXAMINATION:  Blood pressure 131/71, pulse 78, temperature 97.8 F (36.6 C), temperature source Oral, resp. rate 14, weight 140 lb 4.8 oz (63.6 kg), SpO2 100%.  ECOG PERFORMANCE STATUS:   Physical Exam  Constitutional: Oriented to person, place, and time and well-developed, well-nourished, and in no distress.   HENT:  Head: Normocephalic and atraumatic.  Mouth/Throat: Oropharynx is clear and  moist. No oropharyngeal exudate.  Eyes: Conjunctivae are normal. Right eye exhibits no discharge. Left eye exhibits no discharge. No scleral icterus.  Neck: Normal range of motion. Neck supple.  Cardiovascular: Normal rate, regular rhythm, normal heart sounds and intact distal pulses.   Pulmonary/Chest: Effort normal and breath sounds normal. No respiratory distress. No wheezes. No rales.  Abdominal: Soft. Bowel sounds are normal. Exhibits no distension and no mass. There is no tenderness.  Musculoskeletal: Positive for scoliosis. Normal range of motion. Exhibits no edema.  Lymphadenopathy:    No cervical adenopathy.  Neurological: Alert and oriented to person, place, and time. Exhibits normal muscle tone. Walks with a limp/cane.  Examined in the wheelchair today. Skin: Some calluses on the bottom of his feet bilaterally without any significant erythema or skin peeling.  Skin is warm and dry. No rash noted. Not diaphoretic. No erythema. No pallor.  Psychiatric: Mood, memory and judgment normal.  Vitals reviewed.  LABORATORY DATA: Lab Results  Component Value Date   WBC 2.9 (L) 03/02/2023   HGB 12.8 (L) 03/02/2023   HCT 37.9 (L) 03/02/2023   MCV 92.7 03/02/2023   PLT 127 (L) 03/02/2023      Chemistry      Component Value Date/Time   NA 133 (L) 03/02/2023 1136   NA 140 04/22/2022 0000   K 4.5 03/02/2023 1136   CL 100 03/02/2023 1136   CO2 28 03/02/2023 1136   BUN 17 03/02/2023 1136   BUN 21 04/22/2022 0000   CREATININE 1.36 (H) 03/02/2023 1136   CREATININE 1.09 02/06/2020 1306   GLU 88 04/22/2022 0000      Component Value Date/Time   CALCIUM 9.5 03/02/2023 1136   ALKPHOS 89 03/02/2023 1136   AST 27 03/02/2023 1136   ALT 21 03/02/2023 1136   BILITOT 0.4 03/02/2023 1136       RADIOGRAPHIC STUDIES:  No results found.   ASSESSMENT/PLAN:  This is a very pleasant 82 year old Caucasian male diagnosed with metastatic papillary renal cell carcinoma.  This was initially  diagnosed as a stage IIIa (T3a, N0, M0) papillary renal cell carcinoma with rhabdoid features.  This was diagnosed in June 2023.  He is status post a right open radical nephrectomy.  Patient was found to have metastatic disease to the liver and bone in February 2024.   The patient had previously underwent adjuvant treatment with Keytruda 20 mg IV every 3 weeks for 9 cycles.   When he saw Dr. Arbutus Ped on 06/11/2022 and reviewed his MRI with him which showed multiple new rim-enhancing lesions throughout the liver consistent metastatic disease.  There was also a rim-enhancing  lesion in the inferior endplate of L1 measuring 2.1 x 1.1 cm concerning for metastatic disease.  There is no local recurrence or lymphadenopathy. He also had a chest CT by Dr. Cardell Peach which showed pulmonary metastatic disease.   Dr. Arbutus Ped recommended that the patient start treatment with nivolumab 480 mg IV every 4 weeks in addition to Cabometyx 40 mg grams p.o. daily.  He started this on 06/18/2022. He is status post 7 cycles of nivolumab.   He then had GI side effects of cabometyx with diarrhea and weight loss. His treatment has periodically been on hold. His dose was reduced to 20 mg in May 2024, but he continued to have intolerance. Therefore, this has been on hold since 5/19. His diarrhea and weight loss have since resolved.   Unfortunately, the patient's restaging CT scan from August showed disease progression.   The patient saw Dr. Clarene Duke at [facility] for second opinion.  She recommended lenvatinib plus everolimus.  She recommended starting at a small dose reduction of 14 mg in standard dose everolimus standard dose 5 mg given his prior issues with cabo. If tolerates very well, could increase lenvatinib.  He had progression in the future, then she would recommend clinical trial if available versus axitinib. Therefore, he started this on 01/28/23 and tolerated it fair except for some mouth sores, thrombocytopenia, mild nausea, hypertension,  and sensitivity on the bottom of his feet.  His afinitor and lenvima has been held for about 1 week due to dental extraction yesterday.   The patient was seen with Dr. Arbutus Ped today. Labs were reviewed. Recommend that he resume taking his lenvima and afinitor on Saturday.   We will see him back for evaluation and repeat blood work in 2 weeks to monitor his blood counts closely since he had some thrombocytopenia when he started treatment. We reviewed bleeding precautions  He is going to monitor his BP closely at home. He takes ARB in the AM. In the PM, he has norvasc. If his BP is not at goal >140 systolic, he will take his norvasc as prescribed by his nephrologist.   He will continue urea cream for his skin sensitivities. We discussed being careful with the calluses and not to cut them which could introduce infection. If he needs to file it down, to be cautious.   He has some taste changes with the treatment. No evidence of thrush. He will continue biotene.   The patient was advised to call immediately if she has any concerning symptoms in the interval. The patient voices understanding of current disease status and treatment options and is in agreement with the current care plan. All questions were answered. The patient knows to call the clinic with any problems, questions or concerns. We can certainly see the patient much sooner if necessary       Orders Placed This Encounter  Procedures   CBC with Differential (Cancer Center Only)    Standing Status:   Future    Standing Expiration Date:   03/03/2024   CMP (Cancer Center only)    Standing Status:   Future    Standing Expiration Date:   03/03/2024       Johnette Abraham Kamira Mellette, PA-C 03/04/23  ADDENDUM: Hematology/Oncology Attending: I had a face-to-face encounter with the patient today.  I reviewed his record, lab and recommended his care plan.  This is a very pleasant 82 years old white male with metastatic renal cell  carcinoma, papillary renal cell carcinoma with rhabdoid features initially  diagnosed as stage III but has evidence for metastatic disease to the liver and bone in February 2024.  He is status post several treatment including treatment with Keytruda as well as Cabometyx that was discontinued secondary to intolerance and disease progression in July 2024. The patient is currently on treatment with lenvatinib 14 mg p.o. daily in addition to everolimus 5 mg started on February 02, 2023.  He has been tolerating this treatment well.  He has the treatment and hold for few days because of dental extraction procedure but resumed again and he is tolerating it fairly well. I recommended for the patient to continue his current treatment with the same regimen.  Will continue to monitor his blood count closely especially with the pancytopenia after starting the treatment. He will come back for follow-up visit in 2 weeks for evaluation and repeat blood work. The patient was advised to call immediately if he has any other concerning symptoms in the interval. The total time spent in the appointment was 30 minutes. Disclaimer: This note was dictated with voice recognition software. Similar sounding words can inadvertently be transcribed and may be missed upon review. Lajuana Matte, MD

## 2023-03-02 ENCOUNTER — Inpatient Hospital Stay: Payer: Medicare Other

## 2023-03-02 ENCOUNTER — Telehealth: Payer: Self-pay

## 2023-03-02 DIAGNOSIS — C649 Malignant neoplasm of unspecified kidney, except renal pelvis: Secondary | ICD-10-CM

## 2023-03-02 DIAGNOSIS — C641 Malignant neoplasm of right kidney, except renal pelvis: Secondary | ICD-10-CM | POA: Diagnosis not present

## 2023-03-02 LAB — CBC WITH DIFFERENTIAL (CANCER CENTER ONLY)
Abs Immature Granulocytes: 0 10*3/uL (ref 0.00–0.07)
Basophils Absolute: 0 10*3/uL (ref 0.0–0.1)
Basophils Relative: 1 %
Eosinophils Absolute: 0.1 10*3/uL (ref 0.0–0.5)
Eosinophils Relative: 2 %
HCT: 37.9 % — ABNORMAL LOW (ref 39.0–52.0)
Hemoglobin: 12.8 g/dL — ABNORMAL LOW (ref 13.0–17.0)
Immature Granulocytes: 0 %
Lymphocytes Relative: 18 %
Lymphs Abs: 0.5 10*3/uL — ABNORMAL LOW (ref 0.7–4.0)
MCH: 31.3 pg (ref 26.0–34.0)
MCHC: 33.8 g/dL (ref 30.0–36.0)
MCV: 92.7 fL (ref 80.0–100.0)
Monocytes Absolute: 0.5 10*3/uL (ref 0.1–1.0)
Monocytes Relative: 16 %
Neutro Abs: 1.8 10*3/uL (ref 1.7–7.7)
Neutrophils Relative %: 63 %
Platelet Count: 127 10*3/uL — ABNORMAL LOW (ref 150–400)
RBC: 4.09 MIL/uL — ABNORMAL LOW (ref 4.22–5.81)
RDW: 12.4 % (ref 11.5–15.5)
WBC Count: 2.9 10*3/uL — ABNORMAL LOW (ref 4.0–10.5)
nRBC: 0 % (ref 0.0–0.2)

## 2023-03-02 LAB — CMP (CANCER CENTER ONLY)
ALT: 21 U/L (ref 0–44)
AST: 27 U/L (ref 15–41)
Albumin: 3.8 g/dL (ref 3.5–5.0)
Alkaline Phosphatase: 89 U/L (ref 38–126)
Anion gap: 5 (ref 5–15)
BUN: 17 mg/dL (ref 8–23)
CO2: 28 mmol/L (ref 22–32)
Calcium: 9.5 mg/dL (ref 8.9–10.3)
Chloride: 100 mmol/L (ref 98–111)
Creatinine: 1.36 mg/dL — ABNORMAL HIGH (ref 0.61–1.24)
GFR, Estimated: 53 mL/min — ABNORMAL LOW (ref >60)
Glucose, Bld: 127 mg/dL — ABNORMAL HIGH (ref 70–99)
Potassium: 4.5 mmol/L (ref 3.5–5.1)
Sodium: 133 mmol/L — ABNORMAL LOW (ref 135–145)
Total Bilirubin: 0.4 mg/dL (ref 0.3–1.2)
Total Protein: 6.5 g/dL (ref 6.5–8.1)

## 2023-03-02 NOTE — Telephone Encounter (Signed)
Per [place], PA- Spoke with pt about increase in plt count and he can proceed with dental procedure tomorrow.

## 2023-03-04 ENCOUNTER — Other Ambulatory Visit: Payer: PRIVATE HEALTH INSURANCE

## 2023-03-04 ENCOUNTER — Inpatient Hospital Stay (HOSPITAL_BASED_OUTPATIENT_CLINIC_OR_DEPARTMENT_OTHER): Payer: Medicare Other | Admitting: Physician Assistant

## 2023-03-04 ENCOUNTER — Other Ambulatory Visit: Payer: Self-pay | Admitting: Internal Medicine

## 2023-03-04 VITALS — BP 131/71 | HR 78 | Temp 97.8°F | Resp 14 | Wt 140.3 lb

## 2023-03-04 DIAGNOSIS — C649 Malignant neoplasm of unspecified kidney, except renal pelvis: Secondary | ICD-10-CM | POA: Diagnosis not present

## 2023-03-04 DIAGNOSIS — C641 Malignant neoplasm of right kidney, except renal pelvis: Secondary | ICD-10-CM

## 2023-03-18 NOTE — Progress Notes (Unsigned)
[facility] OFFICE PROGRESS NOTE  [name], MD 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   PRIOR THERAPY: 1) Status post right open radical nephrectomy completed on October 15, 2021 by Dr. Jettie Pagan.  2) Keytruda 200 Mg IV every 3 weeks started December 03, 2021.  Status post 9 cycles.  Last dose was given May 21, 2022.  This was discontinued secondary to disease progression. 3) Systemic treatment with nivolumab 480 Mg IV every 4 weeks in addition to Cabometyx 40 mg p.o. daily.  First dose June 18, 2022.  Status post 7 cycles.  His Cabometyx has been on hold recently due to adverse side effects. His dose was reduced in May to 20 mg p.o. daily but he continues to have weakness, diarrhea, and dehydration. This has been on hold since 5/19.  Last dose was given December 02, 2022 discontinued secondary to disease progression  CURRENT THERAPY: Starting 14 mg of lenvatinib plus everolimus 5 mg on 02/02/23   INTERVAL HISTORY: Micheal Mcintyre 82 y.o. male returns to the clinic today for a follow up visit accompanied by his wife. When the patient was seen by myself on 03/04/23.  His scan performed in August showed some increased size of pulmonary nodules in addition to increasing size of the hepatic metastatic disease.  Therefore, the patient saw Dr. Clarene Duke at [facility] on 01/14/2023.  They recommended treatment with lenvatinib plus everolimus 5 milligrams. The patient's dose of lenvatinib was reduced due to prior intolerance to Cabometyx.  He started his treatment on 02/02/23 and thus far has been tolerating it fair except his labs showed worsening thrombocytopenia at this last appointment. He also had some skin sensitivity around the soles of his feet and mouth sores. He also had some  hypertension and started back taking norvasc at night in addition to telmistartan if his BP was not at goal.   He had a root canal a few weeks ago.  He had a follow-up appointment on 11/19.   Regarding his treatment, he has Magic mouthwash if needed for mouth sores but has not been as bad.  He also previously had some suggestive findings of mild hand-foot syndrome for which he uses urea creams.  He denies any fever, chills, or night sweats.  His weight is stable despite having altered taste.  Epistaxis?  Any other abnormal bleeding or bruising?  He has some mild nausea but has his antiemetics which are effective.  He denies any vomiting.  He denies any significant diarrhea and has Lomotil if needed.   He is here today for evaluation and repeat blood work.   MEDICAL HISTORY: Past Medical History:  Diagnosis Date   Arthritis    GERD (gastroesophageal reflux disease)    Hypertension    Medicare annual wellness visit, subsequent 08/19/2014   Osteoarthritis 09/21/2007   Qualifier: Diagnosis of  By: Alphonzo Severance MD, Ivar Drape R    PONV (postoperative nausea and vomiting)    Preventative health care 06/07/2016   Right shoulder pain 12/03/2016    ALLERGIES:  is allergic to statins.  MEDICATIONS:  Current Outpatient Medications  Medication Sig Dispense Refill   acetaminophen (TYLENOL) 500 MG tablet Take 1,000 mg by mouth every 6 (six) hours as needed for moderate pain.     aluminum hydroxide-magnesium  carbonate (GAVISCON) 95-358 MG/15ML SUSP Take 15 mLs by mouth as needed for heartburn or indigestion. (Patient not taking: Reported on 03/04/2023)     amoxicillin-clavulanate (AUGMENTIN) 500-125 MG tablet Take 1 tablet by mouth every 8 (eight) hours. (Patient not taking: Reported on 03/04/2023)     Cholecalciferol (VITAMIN D3) 10 MCG (400 UNIT) tablet Take 800 Units by mouth daily.     everolimus (AFINITOR) 5 MG tablet Take 1 tablet (5 mg total) by mouth daily. 30 tablet 2   lenvatinib 14 mg daily dose  (LENVIMA) 10 & 4 MG capsule Take 14 mg by mouth daily. (Take one 10mg  capsule and one 4mg  capsule for a total dose of 14mg ) 60 capsule 2   levothyroxine (SYNTHROID) 50 MCG tablet TAKE 1 TABLET(50 MCG) BY MOUTH DAILY BEFORE BREAKFAST 30 tablet 1   magic mouthwash SOLN Take 5 mLs by mouth 3 (three) times daily as needed for mouth pain. Suspension contains equal amounts of Maalox Extra Strength, nystatin, and diphenhydramine. 240 mL 0   magnesium chloride (SLOW-MAG) 64 MG TBEC SR tablet Take 1 tablet (64 mg total) by mouth 2 (two) times daily. (Patient not taking: Reported on 03/04/2023) 60 tablet 0   Multiple Vitamin (MULTIVITAMIN WITH MINERALS) TABS tablet Take 1 tablet by mouth 4 (four) times a week.     ondansetron (ZOFRAN) 8 MG tablet Take 1 tablet (8 mg total) by mouth every 8 (eight) hours as needed for nausea or vomiting. 30 tablet 2   rosuvastatin (CRESTOR) 20 MG tablet Take 0.5 tablets (10 mg total) by mouth daily. (Patient not taking: Reported on 01/27/2023) 90 tablet 3   telmisartan (MICARDIS) 80 MG tablet TAKE 1/2 TABLET BY MOUTH TWICE DAILY 180 tablet 1   TURMERIC PO Take 1 tablet by mouth 3 (three) times a week. (Patient not taking: Reported on 03/04/2023)     Vitamin D, Ergocalciferol, (DRISDOL) 1.25 MG (50000 UNIT) CAPS capsule Take 1 capsule (50,000 Units total) by mouth every 7 (seven) days. (Patient not taking: Reported on 03/04/2023) 4 capsule 4   No current facility-administered medications for this visit.    SURGICAL HISTORY:  Past Surgical History:  Procedure Laterality Date   CHOLECYSTECTOMY     HIP SURGERY     JOINT REPLACEMENT     NEPHRECTOMY Right 10/15/2021   Procedure: NEPHRECTOMY, OPEN;  Surgeon: Jannifer Hick, MD;  Location: [facility];  Service: Urology;  Laterality: Right;   nose skin cancer removal     TOTAL HIP ARTHROPLASTY      REVIEW OF SYSTEMS:   Review of Systems  Constitutional: Negative for appetite change, chills, fatigue, fever and unexpected weight  change.  HENT:   Negative for mouth sores, nosebleeds, sore throat and trouble swallowing.   Eyes: Negative for eye problems and icterus.  Respiratory: Negative for cough, hemoptysis, shortness of breath and wheezing.   Cardiovascular: Negative for chest pain and leg swelling.  Gastrointestinal: Negative for abdominal pain, constipation, diarrhea, nausea and vomiting.  Genitourinary: Negative for bladder incontinence, difficulty urinating, dysuria, frequency and hematuria.   Musculoskeletal: Negative for back pain, gait problem, neck pain and neck stiffness.  Skin: Negative for itching and rash.  Neurological: Negative for dizziness, extremity weakness, gait problem, headaches, light-headedness and seizures.  Hematological: Negative for adenopathy. Does not bruise/bleed easily.  Psychiatric/Behavioral: Negative for confusion, depression and sleep disturbance. The patient is not nervous/anxious.     PHYSICAL EXAMINATION:  There were no vitals taken for this visit.  ECOG PERFORMANCE STATUS: {CHL  ONC ECOG [id number]  Physical Exam  Constitutional: Oriented to person, place, and time and well-developed, well-nourished, and in no distress. No distress.  HENT:  Head: Normocephalic and atraumatic.  Mouth/Throat: Oropharynx is clear and moist. No oropharyngeal exudate.  Eyes: Conjunctivae are normal. Right eye exhibits no discharge. Left eye exhibits no discharge. No scleral icterus.  Neck: Normal range of motion. Neck supple.  Cardiovascular: Normal rate, regular rhythm, normal heart sounds and intact distal pulses.   Pulmonary/Chest: Effort normal and breath sounds normal. No respiratory distress. No wheezes. No rales.  Abdominal: Soft. Bowel sounds are normal. Exhibits no distension and no mass. There is no tenderness.  Musculoskeletal: Normal range of motion. Exhibits no edema.  Lymphadenopathy:    No cervical adenopathy.  Neurological: Alert and oriented to person, place, and time.  Exhibits normal muscle tone. Gait normal. Coordination normal.  Skin: Skin is warm and dry. No rash noted. Not diaphoretic. No erythema. No pallor.  Psychiatric: Mood, memory and judgment normal.  Vitals reviewed.  LABORATORY DATA: Lab Results  Component Value Date   WBC 2.9 (L) 03/02/2023   HGB 12.8 (L) 03/02/2023   HCT 37.9 (L) 03/02/2023   MCV 92.7 03/02/2023   PLT 127 (L) 03/02/2023      Chemistry      Component Value Date/Time   NA 133 (L) 03/02/2023 1136   NA 140 04/22/2022 0000   K 4.5 03/02/2023 1136   CL 100 03/02/2023 1136   CO2 28 03/02/2023 1136   BUN 17 03/02/2023 1136   BUN 21 04/22/2022 0000   CREATININE 1.36 (H) 03/02/2023 1136   CREATININE 1.09 02/06/2020 1306   GLU 88 04/22/2022 0000      Component Value Date/Time   CALCIUM 9.5 03/02/2023 1136   ALKPHOS 89 03/02/2023 1136   AST 27 03/02/2023 1136   ALT 21 03/02/2023 1136   BILITOT 0.4 03/02/2023 1136       RADIOGRAPHIC STUDIES:  No results found.   ASSESSMENT/PLAN:  This is a very pleasant 82 year old Caucasian male diagnosed with metastatic papillary renal cell carcinoma.  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  This was diagnosed in June 2023.  He is status post a right open radical nephrectomy.  Patient was found to have metastatic disease to the liver and bone in February 2024.   The patient had previously underwent adjuvant treatment with Keytruda 20 mg IV every 3 weeks for 9 cycles.   When he saw Dr. Arbutus Ped on 06/11/2022 and reviewed his MRI with him which showed multiple new rim-enhancing lesions throughout the liver consistent metastatic disease.  There was also a rim-enhancing lesion in the inferior endplate of L1 measuring 2.1 x 1.1 cm concerning for metastatic disease.  There is no local recurrence or lymphadenopathy. He also had a chest CT by Dr. Cardell Peach which showed pulmonary metastatic disease.    Dr. Arbutus Ped  recommended that the patient start treatment with nivolumab 480 mg IV every 4 weeks in addition to Cabometyx 40 mg grams p.o. daily.  He started this on 06/18/2022. He is status post 7 cycles of nivolumab.   He then had GI side effects of cabometyx with diarrhea and weight loss. His treatment has periodically been on hold. His dose was reduced to 20 mg in May 2024, but he continued to have intolerance. Therefore, this has been on hold since 5/19. His diarrhea and weight loss have since resolved.  Unfortunately, the patient's restaging CT scan from August showed disease progression.   The patient saw Dr. Clarene Duke at [facility] for second opinion.  She recommended lenvatinib plus everolimus.  She recommended starting at a small dose reduction of 14 mg in standard dose everolimus standard dose 5 mg given his prior issues with cabo. If tolerates very well, could increase lenvatinib.  He had progression in the future, then she would recommend clinical trial if available versus axitinib. Therefore, he started this on 01/28/23 and tolerated it fair except for some mouth sores, thrombocytopenia, mild nausea, hypertension, and sensitivity on the bottom of his feet.    Labs were reviewed.  Recommend that he ***  I will arrange for restaging CT scan of the chest, abdomen, pelvis prior to his next appointment.  Without contrast?   We will see him back for evaluation and repeat blood work in 2 weeks to monitor his blood counts closely since he had some thrombocytopenia when he started treatment. We reviewed bleeding precautions***   He is going to monitor his BP closely at home. He takes ARB in the AM. In the PM, he has norvasc. If his BP is not at goal >140 systolic, he will take his norvasc as prescribed by his nephrologist.    He will continue urea cream for his skin sensitivities.    He has some taste changes with the treatment. No evidence of thrush. He will continue biotene.   The patient was advised to call  immediately if she has any concerning symptoms in the interval. The patient voices understanding of current disease status and treatment options and is in agreement with the current care plan. All questions were answered. The patient knows to call the clinic with any problems, questions or concerns. We can certainly see the patient much sooner if necessary  No orders of the defined types were placed in this encounter.    I spent {CHL ONC TIME VISIT - ZOXWR:[id number]} counseling the patient face to face. The total time spent in the appointment was {CHL ONC TIME VISIT - BJYNW:[id number]}.  Tema Alire L Nishanth Mccaughan, PA-C 03/18/23

## 2023-03-22 ENCOUNTER — Other Ambulatory Visit: Payer: Self-pay

## 2023-03-22 ENCOUNTER — Inpatient Hospital Stay (HOSPITAL_BASED_OUTPATIENT_CLINIC_OR_DEPARTMENT_OTHER): Payer: Medicare Other | Admitting: Physician Assistant

## 2023-03-22 ENCOUNTER — Inpatient Hospital Stay: Payer: Medicare Other | Attending: Oncology

## 2023-03-22 VITALS — BP 110/61 | HR 71 | Temp 97.5°F | Resp 18 | Ht 70.0 in | Wt 136.2 lb

## 2023-03-22 DIAGNOSIS — C641 Malignant neoplasm of right kidney, except renal pelvis: Secondary | ICD-10-CM | POA: Insufficient documentation

## 2023-03-22 DIAGNOSIS — C649 Malignant neoplasm of unspecified kidney, except renal pelvis: Secondary | ICD-10-CM

## 2023-03-22 DIAGNOSIS — I10 Essential (primary) hypertension: Secondary | ICD-10-CM | POA: Insufficient documentation

## 2023-03-22 DIAGNOSIS — C7951 Secondary malignant neoplasm of bone: Secondary | ICD-10-CM | POA: Insufficient documentation

## 2023-03-22 DIAGNOSIS — C787 Secondary malignant neoplasm of liver and intrahepatic bile duct: Secondary | ICD-10-CM | POA: Diagnosis present

## 2023-03-22 DIAGNOSIS — C7800 Secondary malignant neoplasm of unspecified lung: Secondary | ICD-10-CM | POA: Insufficient documentation

## 2023-03-22 DIAGNOSIS — Z905 Acquired absence of kidney: Secondary | ICD-10-CM | POA: Diagnosis not present

## 2023-03-22 DIAGNOSIS — R197 Diarrhea, unspecified: Secondary | ICD-10-CM | POA: Insufficient documentation

## 2023-03-22 DIAGNOSIS — Z79899 Other long term (current) drug therapy: Secondary | ICD-10-CM | POA: Insufficient documentation

## 2023-03-22 LAB — CMP (CANCER CENTER ONLY)
ALT: 21 U/L (ref 0–44)
AST: 25 U/L (ref 15–41)
Albumin: 4.1 g/dL (ref 3.5–5.0)
Alkaline Phosphatase: 85 U/L (ref 38–126)
Anion gap: 8 (ref 5–15)
BUN: 17 mg/dL (ref 8–23)
CO2: 27 mmol/L (ref 22–32)
Calcium: 9.3 mg/dL (ref 8.9–10.3)
Chloride: 99 mmol/L (ref 98–111)
Creatinine: 1.31 mg/dL — ABNORMAL HIGH (ref 0.61–1.24)
GFR, Estimated: 55 mL/min — ABNORMAL LOW (ref >60)
Glucose, Bld: 143 mg/dL — ABNORMAL HIGH (ref 70–99)
Potassium: 4.6 mmol/L (ref 3.5–5.1)
Sodium: 134 mmol/L — ABNORMAL LOW (ref 135–145)
Total Bilirubin: 0.6 mg/dL (ref <1.2)
Total Protein: 7 g/dL (ref 6.5–8.1)

## 2023-03-22 LAB — CBC WITH DIFFERENTIAL (CANCER CENTER ONLY)
Abs Immature Granulocytes: 0 10*3/uL (ref 0.00–0.07)
Basophils Absolute: 0 10*3/uL (ref 0.0–0.1)
Basophils Relative: 1 %
Eosinophils Absolute: 0.1 10*3/uL (ref 0.0–0.5)
Eosinophils Relative: 2 %
HCT: 37.5 % — ABNORMAL LOW (ref 39.0–52.0)
Hemoglobin: 12.5 g/dL — ABNORMAL LOW (ref 13.0–17.0)
Immature Granulocytes: 0 %
Lymphocytes Relative: 19 %
Lymphs Abs: 1 10*3/uL (ref 0.7–4.0)
MCH: 30.7 pg (ref 26.0–34.0)
MCHC: 33.3 g/dL (ref 30.0–36.0)
MCV: 92.1 fL (ref 80.0–100.0)
Monocytes Absolute: 0.7 10*3/uL (ref 0.1–1.0)
Monocytes Relative: 12 %
Neutro Abs: 3.5 10*3/uL (ref 1.7–7.7)
Neutrophils Relative %: 66 %
Platelet Count: 143 10*3/uL — ABNORMAL LOW (ref 150–400)
RBC: 4.07 MIL/uL — ABNORMAL LOW (ref 4.22–5.81)
RDW: 12.4 % (ref 11.5–15.5)
WBC Count: 5.3 10*3/uL (ref 4.0–10.5)
nRBC: 0 % (ref 0.0–0.2)

## 2023-03-23 ENCOUNTER — Other Ambulatory Visit: Payer: Self-pay

## 2023-03-23 NOTE — Progress Notes (Signed)
Specialty Pharmacy Refill Coordination Note  MANTAS DONIA is a 82 y.o. male contacted today regarding refills of specialty medication(s) Everolimus (Antineoplastic Enzyme Inhibitors); Lenvatinib Mesylate   Patient requested Delivery   Delivery date: 04/07/23   Verified address: [address] [place] [address]   Medication will be filled on 04/06/23.

## 2023-03-23 NOTE — Progress Notes (Signed)
Yesterday, faxed most recent note and scan to [facility] at [phone].  Fax confirmed.

## 2023-03-24 ENCOUNTER — Other Ambulatory Visit: Payer: Self-pay | Admitting: Physician Assistant

## 2023-04-05 ENCOUNTER — Ambulatory Visit (HOSPITAL_COMMUNITY)
Admission: RE | Admit: 2023-04-05 | Discharge: 2023-04-05 | Disposition: A | Discharge status: Home / Self Care | Payer: Medicare Other | Source: Ambulatory Visit | Attending: Physician Assistant | Admitting: Physician Assistant

## 2023-04-05 DIAGNOSIS — C649 Malignant neoplasm of unspecified kidney, except renal pelvis: Secondary | ICD-10-CM | POA: Insufficient documentation

## 2023-04-05 DIAGNOSIS — C641 Malignant neoplasm of right kidney, except renal pelvis: Secondary | ICD-10-CM | POA: Insufficient documentation

## 2023-04-06 ENCOUNTER — Other Ambulatory Visit: Payer: Self-pay

## 2023-04-12 ENCOUNTER — Other Ambulatory Visit: Payer: Self-pay | Admitting: Family Medicine

## 2023-04-12 ENCOUNTER — Encounter: Payer: Self-pay | Admitting: Internal Medicine

## 2023-04-22 ENCOUNTER — Inpatient Hospital Stay (HOSPITAL_BASED_OUTPATIENT_CLINIC_OR_DEPARTMENT_OTHER): Payer: Medicare Other | Admitting: Internal Medicine

## 2023-04-22 ENCOUNTER — Inpatient Hospital Stay: Payer: Medicare Other

## 2023-04-22 ENCOUNTER — Inpatient Hospital Stay: Payer: Medicare Other | Admitting: Internal Medicine

## 2023-04-22 ENCOUNTER — Inpatient Hospital Stay: Payer: Medicare Other | Attending: Oncology

## 2023-04-22 VITALS — BP 77/50 | HR 81 | Temp 97.4°F | Resp 16 | Ht 70.0 in | Wt 134.4 lb

## 2023-04-22 DIAGNOSIS — Z905 Acquired absence of kidney: Secondary | ICD-10-CM | POA: Insufficient documentation

## 2023-04-22 DIAGNOSIS — R7989 Other specified abnormal findings of blood chemistry: Secondary | ICD-10-CM | POA: Diagnosis not present

## 2023-04-22 DIAGNOSIS — E871 Hypo-osmolality and hyponatremia: Secondary | ICD-10-CM | POA: Insufficient documentation

## 2023-04-22 DIAGNOSIS — C641 Malignant neoplasm of right kidney, except renal pelvis: Secondary | ICD-10-CM | POA: Insufficient documentation

## 2023-04-22 DIAGNOSIS — I959 Hypotension, unspecified: Secondary | ICD-10-CM | POA: Insufficient documentation

## 2023-04-22 DIAGNOSIS — C7951 Secondary malignant neoplasm of bone: Secondary | ICD-10-CM | POA: Insufficient documentation

## 2023-04-22 DIAGNOSIS — C787 Secondary malignant neoplasm of liver and intrahepatic bile duct: Secondary | ICD-10-CM | POA: Diagnosis not present

## 2023-04-22 DIAGNOSIS — K8689 Other specified diseases of pancreas: Secondary | ICD-10-CM | POA: Diagnosis not present

## 2023-04-22 DIAGNOSIS — D696 Thrombocytopenia, unspecified: Secondary | ICD-10-CM | POA: Diagnosis not present

## 2023-04-22 DIAGNOSIS — C649 Malignant neoplasm of unspecified kidney, except renal pelvis: Secondary | ICD-10-CM

## 2023-04-22 LAB — CMP (CANCER CENTER ONLY)
ALT: 20 U/L (ref 0–44)
AST: 21 U/L (ref 15–41)
Albumin: 3.6 g/dL (ref 3.5–5.0)
Alkaline Phosphatase: 66 U/L (ref 38–126)
Anion gap: 8 (ref 5–15)
BUN: 26 mg/dL — ABNORMAL HIGH (ref 8–23)
CO2: 24 mmol/L (ref 22–32)
Calcium: 8.8 mg/dL — ABNORMAL LOW (ref 8.9–10.3)
Chloride: 95 mmol/L — ABNORMAL LOW (ref 98–111)
Creatinine: 1.88 mg/dL — ABNORMAL HIGH (ref 0.61–1.24)
GFR, Estimated: 36 mL/min — ABNORMAL LOW (ref >60)
Glucose, Bld: 105 mg/dL — ABNORMAL HIGH (ref 70–99)
Potassium: 4.1 mmol/L (ref 3.5–5.1)
Sodium: 127 mmol/L — ABNORMAL LOW (ref 135–145)
Total Bilirubin: 0.9 mg/dL (ref <1.2)
Total Protein: 5.9 g/dL — ABNORMAL LOW (ref 6.5–8.1)

## 2023-04-22 LAB — CBC WITH DIFFERENTIAL (CANCER CENTER ONLY)
Abs Immature Granulocytes: 0.01 10*3/uL (ref 0.00–0.07)
Basophils Absolute: 0 10*3/uL (ref 0.0–0.1)
Basophils Relative: 1 %
Eosinophils Absolute: 0.1 10*3/uL (ref 0.0–0.5)
Eosinophils Relative: 3 %
HCT: 32.1 % — ABNORMAL LOW (ref 39.0–52.0)
Hemoglobin: 11.4 g/dL — ABNORMAL LOW (ref 13.0–17.0)
Immature Granulocytes: 0 %
Lymphocytes Relative: 24 %
Lymphs Abs: 0.8 10*3/uL (ref 0.7–4.0)
MCH: 30.2 pg (ref 26.0–34.0)
MCHC: 35.5 g/dL (ref 30.0–36.0)
MCV: 84.9 fL (ref 80.0–100.0)
Monocytes Absolute: 0.7 10*3/uL (ref 0.1–1.0)
Monocytes Relative: 20 %
Neutro Abs: 1.9 10*3/uL (ref 1.7–7.7)
Neutrophils Relative %: 52 %
Platelet Count: 114 10*3/uL — ABNORMAL LOW (ref 150–400)
RBC: 3.78 MIL/uL — ABNORMAL LOW (ref 4.22–5.81)
RDW: 13.1 % (ref 11.5–15.5)
WBC Count: 3.6 10*3/uL — ABNORMAL LOW (ref 4.0–10.5)
nRBC: 0 % (ref 0.0–0.2)

## 2023-04-22 NOTE — Progress Notes (Signed)
[facility] [phone]   [phone]  OFFICE PROGRESS NOTE  [name], MD 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    PRIOR THERAPY: 1) Status post right open radical nephrectomy completed on October 15, 2021 by Dr. Jettie Pagan.  2) Keytruda 200 Mg IV every 3 weeks started December 03, 2021.  Status post 9 cycles.  Last dose was given May 21, 2022.  This was discontinued secondary to disease progression. 3) Systemic treatment with nivolumab 480 Mg IV every 4 weeks in addition to Cabometyx 40 mg p.o. daily.  First dose June 18, 2022.  Status post 7 cycles.  His Cabometyx has been on hold recently due to adverse side effects. His dose was reduced in May to 20 mg p.o. daily but he continues to have weakness, diarrhea, and dehydration. This has been on hold since 5/19.  Last dose was given December 02, 2022 discontinued secondary to disease progression   CURRENT THERAPY: Starting 14 mg of  lenvatinib plus everolimus 5 mg on 02/02/23   INTERVAL HISTORY: Micheal Mcintyre 82 y.o. male returns to the clinic today for follow-up visit accompanied by his wife.Discussed the use of AI scribe software for clinical note transcription with the patient, who gave verbal consent to proceed.  History of Present Illness   The patient, an 82 year old individual with a history of papillary renal cell carcinoma, underwent a right nephrectomy in June 2023. Post-surgery, he was treated with [facility] as adjuvant therapy. However, in February 2024, liver metastasis was discovered, leading to a change in treatment to nivolumab and Cabometyx. After seven cycles, this regimen was discontinued due to disease progression and poor tolerance. The patient has been on lenvatinib and  everolimus since October 2024.  The patient reports experiencing side effects from the current treatment, which initially improved but have since returned. The most significant side effect is severe diarrhea, which occurred six times in one night, leading to a feeling of weakness and low blood pressure. The patient temporarily stopped the medication, which resulted in a decrease in the frequency of diarrhea but continued loose stools. He has been managing the diarrhea with Imodium and has been prescribed Lomotil as an additional medication if needed.  The patient also reports a history of a tooth infection, which required a root canal and a temporary cessation of treatment. During this period, he noticed an improvement in his overall feeling of wellness.  The patient has also sought a second opinion from [facility] and is considering proton therapy. He has been in contact with a nephrologist due to high creatinine levels and low sodium levels, indicative of kidney issues.       MEDICAL HISTORY: Past Medical History:  Diagnosis Date   Arthritis    GERD (gastroesophageal reflux disease)    Hypertension    Medicare annual wellness visit, subsequent 08/19/2014   Osteoarthritis 09/21/2007   Qualifier: Diagnosis of  By: Alphonzo Severance MD, Ivar Drape R    PONV (postoperative nausea and vomiting)    Preventative health care 06/07/2016   Right shoulder pain 12/03/2016    ALLERGIES:  is allergic to statins.  MEDICATIONS:  Current Outpatient Medications  Medication Sig Dispense Refill   acetaminophen (TYLENOL) 500 MG tablet Take 1,000 mg by mouth  every 6 (six) hours as needed for moderate pain.     aluminum hydroxide-magnesium carbonate (GAVISCON) 95-358 MG/15ML SUSP Take 15 mLs by mouth as needed for heartburn or indigestion.     amoxicillin-clavulanate (AUGMENTIN) 500-125 MG tablet Take 1 tablet by mouth every 8 (eight) hours. (Patient not taking: Reported on 03/04/2023)     Cholecalciferol  (VITAMIN D3) 10 MCG (400 UNIT) tablet Take 800 Units by mouth daily.     diphenoxylate-atropine (LOMOTIL) 2.5-0.025 MG tablet TAKE 1 TABLET BY MOUTH FOUR TIMES DAILY AS NEEDED FOR DIARRHEA OR LOOSE STOOLS 30 tablet 0   everolimus (AFINITOR) 5 MG tablet Take 1 tablet (5 mg total) by mouth daily. 30 tablet 2   lenvatinib 14 mg daily dose (LENVIMA) 10 & 4 MG capsule Take 14 mg by mouth daily. (Take one 10mg  capsule and one 4mg  capsule for a total dose of 14mg ) 60 capsule 2   levothyroxine (SYNTHROID) 50 MCG tablet TAKE 1 TABLET(50 MCG) BY MOUTH DAILY BEFORE BREAKFAST 30 tablet 1   magic mouthwash SOLN Take 5 mLs by mouth 3 (three) times daily as needed for mouth pain. Suspension contains equal amounts of Maalox Extra Strength, nystatin, and diphenhydramine. (Patient not taking: Reported on 03/22/2023) 240 mL 0   magnesium chloride (SLOW-MAG) 64 MG TBEC SR tablet Take 1 tablet (64 mg total) by mouth 2 (two) times daily. (Patient not taking: Reported on 03/04/2023) 60 tablet 0   Multiple Vitamin (MULTIVITAMIN WITH MINERALS) TABS tablet Take 1 tablet by mouth 4 (four) times a week. (Patient not taking: Reported on 03/22/2023)     ondansetron (ZOFRAN) 8 MG tablet Take 1 tablet (8 mg total) by mouth every 8 (eight) hours as needed for nausea or vomiting. 30 tablet 2   rosuvastatin (CRESTOR) 20 MG tablet Take 0.5 tablets (10 mg total) by mouth daily. (Patient not taking: Reported on 01/27/2023) 90 tablet 3   telmisartan (MICARDIS) 80 MG tablet TAKE 1/2 TABLET BY MOUTH TWICE DAILY 180 tablet 1   TURMERIC PO Take 1 tablet by mouth 3 (three) times a week. (Patient not taking: Reported on 03/04/2023)     Vitamin D, Ergocalciferol, (DRISDOL) 1.25 MG (50000 UNIT) CAPS capsule Take 1 capsule (50,000 Units total) by mouth every 7 (seven) days. 4 capsule 4   No current facility-administered medications for this visit.    SURGICAL HISTORY:  Past Surgical History:  Procedure Laterality Date   CHOLECYSTECTOMY     HIP  SURGERY     JOINT REPLACEMENT     NEPHRECTOMY Right 10/15/2021   Procedure: NEPHRECTOMY, OPEN;  Surgeon: Jannifer Hick, MD;  Location: [facility];  Service: Urology;  Laterality: Right;   nose skin cancer removal     TOTAL HIP ARTHROPLASTY      REVIEW OF SYSTEMS:  Constitutional: positive for fatigue Eyes: negative Ears, nose, mouth, throat, and face: negative Respiratory: negative Cardiovascular: negative Gastrointestinal: positive for diarrhea Genitourinary:negative Integument/breast: negative Hematologic/lymphatic: negative Musculoskeletal:positive for muscle weakness Neurological: negative Behavioral/Psych: negative Endocrine: negative Allergic/Immunologic: negative   PHYSICAL EXAMINATION: General appearance: alert, cooperative, fatigued, and no distress Head: Normocephalic, without obvious abnormality, atraumatic Neck: no adenopathy, no JVD, supple, symmetrical, trachea midline, and thyroid not enlarged, symmetric, no tenderness/mass/nodules Lymph nodes: Cervical, supraclavicular, and axillary nodes normal. Resp: clear to auscultation bilaterally Back: symmetric, no curvature. ROM normal. No CVA tenderness. Cardio: regular rate and rhythm, S1, S2 normal, no murmur, click, rub or gallop GI: soft, non-tender; bowel sounds normal; no masses,  no organomegaly Extremities: extremities  normal, atraumatic, no cyanosis or edema Neurologic: Alert and oriented X 3, normal strength and tone. Normal symmetric reflexes. Normal coordination and gait  ECOG PERFORMANCE STATUS: 1 - Symptomatic but completely ambulatory  Blood pressure (!) 77/50, pulse 81, temperature (!) 97.4 F (36.3 C), temperature source Temporal, resp. rate 16, height 5\' 10"  (1.778 m), weight 134 lb 6.4 oz (61 kg), SpO2 100%.  LABORATORY DATA: Lab Results  Component Value Date   WBC 3.6 (L) 04/22/2023   HGB 11.4 (L) 04/22/2023   HCT 32.1 (L) 04/22/2023   MCV 84.9 04/22/2023   PLT 114 (L) 04/22/2023      Chemistry       Component Value Date/Time   NA 127 (L) 04/22/2023 0945   NA 140 04/22/2022 0000   K 4.1 04/22/2023 0945   CL 95 (L) 04/22/2023 0945   CO2 24 04/22/2023 0945   BUN 26 (H) 04/22/2023 0945   BUN 21 04/22/2022 0000   CREATININE 1.88 (H) 04/22/2023 0945   CREATININE 1.09 02/06/2020 1306   GLU 88 04/22/2022 0000      Component Value Date/Time   CALCIUM 8.8 (L) 04/22/2023 0945   ALKPHOS 66 04/22/2023 0945   AST 21 04/22/2023 0945   ALT 20 04/22/2023 0945   BILITOT 0.9 04/22/2023 0945       RADIOGRAPHIC STUDIES: CT CHEST ABDOMEN PELVIS WO CONTRAST Result Date: 04/16/2023 CLINICAL DATA:  Renal cell carcinoma. Evaluate for metastatic disease. * Tracking Code: BO * EXAM: CT CHEST, ABDOMEN AND PELVIS WITHOUT CONTRAST TECHNIQUE: Multidetector CT imaging of the chest, abdomen and pelvis was performed following the standard protocol without IV contrast. RADIATION DOSE REDUCTION: This exam was performed according to the departmental dose-optimization program which includes automated exposure control, adjustment of the mA and/or kV according to patient size and/or use of iterative reconstruction technique. COMPARISON:  12/22/2022 FINDINGS: CT CHEST FINDINGS Cardiovascular: Aortic atherosclerosis. Normal heart size, without pericardial effusion. Mediastinum/Nodes: No supraclavicular adenopathy. No mediastinal or hilar adenopathy, given limitations of unenhanced CT. Lungs/Pleura: No pleural fluid. Mild lower lung predominant cylindrical bronchiectasis is likely postinfectious/inflammatory. Bilateral, lower lung predominant solid pulmonary nodules again identified. Index right lower lobe 4 mm nodule on 116/4 is similar. More posterior right lower lobe index nodule measures 5 mm on 110/4 versus 7 mm previously. Left lower lobe pulmonary nodule measures 8 mm on 125/4 and is unchanged. More cephalad and medial 5 mm left lower lobe nodule on 91/4 measures 6 mm on the prior exam (when remeasured). Resolving  infection in the left upper lobe with developing scar laterally on 70/5. More anterior left upper lobe developing bronchiectasis at the site of consolidation on the prior exam (89/4). Superior segment left lower lobe volume loss consistent with resolving infection and developing scar on 61/4. Musculoskeletal: Included within the abdomen pelvic section. CT ABDOMEN PELVIS FINDINGS Hepatobiliary: Hepatic metastasis are again identified. These are more cystic today, suggesting response to therapy. Index segment 8 lesion measures 3.2 x 3.0 cm on 51/2 versus 3.4 x 2.7 cm on the prior. Pericholecystic segment 5 lesion measures 2.0 x 2.0 cm on 61/2 versus 2.4 by 1.7 cm on the prior. Subcapsular posterior right hepatic lobe 1.0 cm lesion on 54/2 measured 1.2 cm on the prior exam (when remeasured). Normal gallbladder, without biliary ductal dilatation. Pancreas: Pancreatic atrophy. No duct dilatation or acute inflammation. Soft tissue fullness/nodularity about the pancreatic tail measures 1.6 x 1.5 cm on 60/2 and is similar to on the prior exam (when remeasured). This is new  since 09/01/2021. Spleen: Normal in size, without focal abnormality. Adrenals/Urinary Tract: Normal adrenal glands. Right nephrectomy without local recurrence. No left renal calculi or hydronephrosis. Low-density left renal lesions are likely cysts at up to 9 mm. Degraded evaluation of the pelvis, secondary to beam hardening artifact from right hip arthroplasty. Grossly normal urinary bladder. Stomach/Bowel: Normal stomach, without wall thickening. Scattered colonic diverticula. Colonic stool burden suggests constipation. Normal terminal ileum. Normal appendix. Normal small bowel. Vascular/Lymphatic: Aortic atherosclerosis. No abdominal and no gross pelvic adenopathy. Reproductive: Mild prostatomegaly. Other: No significant free fluid.  No free intraperitoneal air. 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 Right hip arthroplasty. Advanced right glenohumeral joint osteoarthritis. Lumbosacral spondylosis. IMPRESSION: 1. Multifactorial degradation, including lack of oral/IV contrast, patient arm position (not raised above the head) and beam hardening artifact from right hip arthroplasty. 2. Minimal improvement in pulmonary metastasis. 3. Mild improvement in hepatic metastasis. Although metastatic lesions measure similar to minimally smaller, they are more cystic today, suggesting interval response to therapy. 4. Similar soft tissue fullness within the pancreatic tail, suspicious for metastatic disease. 5. Right nephrectomy without local recurrence 6. Evolving left lung infection with developing scar. 7. Incidental findings, including: Aortic Atherosclerosis (ICD10-I70.0). Possible constipation. Mild prostatomegaly. Electronically Signed   By: Jeronimo Greaves M.D.   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  He is status post right open radical nephrectomy in June 2023 followed by 9 cycles of treatment with immunotherapy with Keytruda last dose was given May 21, 2022 before it was discontinued secondary to disease progression.  The patient then started systemic chemotherapy with nivolumab and Cabometyx initially at 40 mg p.o. daily but this was reduced to 20 mg p.o. daily before it was discontinued secondary to intolerance.  His treatment with nivolumab was discontinued after 7 cycles secondary to disease progression.  The patient was seen by Dr. Clarene [facility] at [facility] center for a second opinion and she recommended for the patient treatment with lenvatinib and everolimus starting at a reduced dose. He is currently on treatment with  lenvatinib 14 mg p.o. daily in addition to everolimus 5 mg p.o. daily started February 02, 2023.  He has been tolerating this treatment fairly well except for occasional diarrhea. He had repeat CT scan of the chest, abdomen pelvis that showed no concerning findings for disease progression and there is some mild improvement of his disease.     Papillary Renal Cell Carcinoma with Liver Metastasis Initially diagnosed in June 2023 with right nephrectomy. Liver metastasis identified in February 2024. Previous treatments included Keytruda, nivolumab, and Cabometyx, discontinued due to progression and intolerance. Currently on lenvatinib and everolimus since February 02, 2023. Recent scan shows mild tumor shrinkage. Experiencing severe diarrhea and hypotension, leading to temporary discontinuation of medication. Discussed use of Imodium and Lomotil for diarrhea management. Second opinion at [facility] on proton therapy, not indicated for current condition. - Continue lenvatinib and everolimus - Use Imodium up to 16 mg/day as needed for diarrhea - Use Lomotil in addition to Imodium if diarrhea is severe, not exceeding 3-4 times/day - Hold treatment for 1-2 days if diarrhea is severe, then resume - Hydrate adequately to manage hypotension - Avoid antihypertensive medications if blood pressure  remains low - Repeat blood work in one month - Consider dose reduction if side effects persist  Thrombocytopenia Platelet count at 114,000, acceptable given current treatment regimen. Previous episodes of thrombocytopenia noted. - Monitor platelet count regularly  Hyponatremia and Elevated Creatinine Low sodium and high creatinine levels, likely related to underlying kidney issues. - Discuss kidney function and lab results with nephrologist - Monitor kidney function closely  Pancreatic Atrophy Pancreatic atrophy noted with no significant changes or signs of malignancy. Soft tissue fullness in the tail of the  pancreas remains stable. - Monitor pancreatic condition with regular imaging as needed  General Health Maintenance Coordination with [facility] for ongoing care. Ensured all relevant scan results and records are accessible to [facility] and [place]. - Ensure all relevant scan results and records are accessible to [facility] and [facility] - Maintain communication with Dr. Clarene [facility] at [facility] and Dr. Lucianne Muss at [facility]  Follow-up - Follow-up appointment in one month - Repeat blood work at next visit.   The patient was advised to call immediately if he has any other concerning symptoms in the interval. The patient voices understanding of current disease status and treatment options and is in agreement with the current care plan.  All questions were answered. The patient knows to call the clinic with any problems, questions or concerns. We can certainly see the patient much sooner if necessary.  The total time spent in the appointment was 30 minutes.  Disclaimer: This note was dictated with voice recognition software. Similar sounding words can inadvertently be transcribed and may not be corrected upon review.

## 2023-04-29 ENCOUNTER — Other Ambulatory Visit: Payer: Self-pay | Admitting: Internal Medicine

## 2023-05-04 ENCOUNTER — Other Ambulatory Visit (HOSPITAL_COMMUNITY): Payer: Self-pay

## 2023-05-04 ENCOUNTER — Encounter: Payer: Self-pay | Admitting: Internal Medicine

## 2023-05-12 ENCOUNTER — Encounter: Payer: Self-pay | Admitting: Internal Medicine

## 2023-05-17 ENCOUNTER — Other Ambulatory Visit (HOSPITAL_COMMUNITY): Payer: Self-pay

## 2023-05-17 ENCOUNTER — Other Ambulatory Visit: Payer: Self-pay

## 2023-05-18 ENCOUNTER — Encounter: Payer: Self-pay | Admitting: Internal Medicine

## 2023-05-18 DIAGNOSIS — C641 Malignant neoplasm of right kidney, except renal pelvis: Secondary | ICD-10-CM | POA: Diagnosis not present

## 2023-05-18 DIAGNOSIS — R69 Illness, unspecified: Secondary | ICD-10-CM | POA: Diagnosis not present

## 2023-05-20 DIAGNOSIS — N189 Chronic kidney disease, unspecified: Secondary | ICD-10-CM | POA: Diagnosis not present

## 2023-05-25 ENCOUNTER — Inpatient Hospital Stay: Payer: PRIVATE HEALTH INSURANCE | Attending: Oncology | Admitting: Internal Medicine

## 2023-05-25 ENCOUNTER — Inpatient Hospital Stay: Payer: PRIVATE HEALTH INSURANCE

## 2023-05-25 ENCOUNTER — Telehealth: Payer: Self-pay | Admitting: Medical Oncology

## 2023-05-25 VITALS — BP 137/84 | HR 76 | Temp 97.3°F | Resp 16 | Ht 70.0 in | Wt 129.8 lb

## 2023-05-25 DIAGNOSIS — R634 Abnormal weight loss: Secondary | ICD-10-CM | POA: Insufficient documentation

## 2023-05-25 DIAGNOSIS — C641 Malignant neoplasm of right kidney, except renal pelvis: Secondary | ICD-10-CM | POA: Diagnosis not present

## 2023-05-25 DIAGNOSIS — Z905 Acquired absence of kidney: Secondary | ICD-10-CM | POA: Insufficient documentation

## 2023-05-25 DIAGNOSIS — D649 Anemia, unspecified: Secondary | ICD-10-CM | POA: Insufficient documentation

## 2023-05-25 DIAGNOSIS — C649 Malignant neoplasm of unspecified kidney, except renal pelvis: Secondary | ICD-10-CM

## 2023-05-25 DIAGNOSIS — I10 Essential (primary) hypertension: Secondary | ICD-10-CM | POA: Insufficient documentation

## 2023-05-25 DIAGNOSIS — R197 Diarrhea, unspecified: Secondary | ICD-10-CM | POA: Diagnosis not present

## 2023-05-25 DIAGNOSIS — C7951 Secondary malignant neoplasm of bone: Secondary | ICD-10-CM | POA: Diagnosis not present

## 2023-05-25 DIAGNOSIS — C787 Secondary malignant neoplasm of liver and intrahepatic bile duct: Secondary | ICD-10-CM | POA: Insufficient documentation

## 2023-05-25 LAB — CMP (CANCER CENTER ONLY)
ALT: 30 U/L (ref 0–44)
AST: 37 U/L (ref 15–41)
Albumin: 4 g/dL (ref 3.5–5.0)
Alkaline Phosphatase: 88 U/L (ref 38–126)
Anion gap: 7 (ref 5–15)
BUN: 18 mg/dL (ref 8–23)
CO2: 28 mmol/L (ref 22–32)
Calcium: 9.1 mg/dL (ref 8.9–10.3)
Chloride: 100 mmol/L (ref 98–111)
Creatinine: 1.24 mg/dL (ref 0.61–1.24)
GFR, Estimated: 59 mL/min — ABNORMAL LOW (ref >60)
Glucose, Bld: 128 mg/dL — ABNORMAL HIGH (ref 70–99)
Potassium: 4.2 mmol/L (ref 3.5–5.1)
Sodium: 135 mmol/L (ref 135–145)
Total Bilirubin: 0.7 mg/dL (ref 0.0–1.2)
Total Protein: 6.6 g/dL (ref 6.5–8.1)

## 2023-05-25 LAB — CBC WITH DIFFERENTIAL (CANCER CENTER ONLY)
Abs Immature Granulocytes: 0.01 10*3/uL (ref 0.00–0.07)
Basophils Absolute: 0 10*3/uL (ref 0.0–0.1)
Basophils Relative: 1 %
Eosinophils Absolute: 0.1 10*3/uL (ref 0.0–0.5)
Eosinophils Relative: 2 %
HCT: 29 % — ABNORMAL LOW (ref 39.0–52.0)
Hemoglobin: 9.8 g/dL — ABNORMAL LOW (ref 13.0–17.0)
Immature Granulocytes: 0 %
Lymphocytes Relative: 15 %
Lymphs Abs: 0.6 10*3/uL — ABNORMAL LOW (ref 0.7–4.0)
MCH: 29.3 pg (ref 26.0–34.0)
MCHC: 33.8 g/dL (ref 30.0–36.0)
MCV: 86.8 fL (ref 80.0–100.0)
Monocytes Absolute: 0.6 10*3/uL (ref 0.1–1.0)
Monocytes Relative: 16 %
Neutro Abs: 2.5 10*3/uL (ref 1.7–7.7)
Neutrophils Relative %: 66 %
Platelet Count: 152 10*3/uL (ref 150–400)
RBC: 3.34 MIL/uL — ABNORMAL LOW (ref 4.22–5.81)
RDW: 14.7 % (ref 11.5–15.5)
WBC Count: 3.7 10*3/uL — ABNORMAL LOW (ref 4.0–10.5)
nRBC: 0 % (ref 0.0–0.2)

## 2023-05-25 LAB — LACTATE DEHYDROGENASE: LDH: 194 U/L — ABNORMAL HIGH (ref 98–192)

## 2023-05-25 NOTE — Telephone Encounter (Signed)
Proton therapy update- Dr. Theodore Demark at [facility] said he saw pt last Friday in consult for proton rx. He  told Jeryl that proton therapy would not provide a benefit to him and SBRT is an option.   [facility] would like a referral to [facility] for SBRT. Dr Theodore Demark would like Dr. Arbutus Ped to re route the referral to [facility]. Dr Arbutus Ped notified .

## 2023-05-25 NOTE — Progress Notes (Signed)
[facility] [phone]   [phone]  OFFICE PROGRESS NOTE  [name], MD 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    PRIOR THERAPY: 1) Status post right open radical nephrectomy completed on October 15, 2021 by Dr. Jettie Pagan.  2) Keytruda 200 Mg IV every 3 weeks started December 03, 2021.  Status post 9 cycles.  Last dose was given May 21, 2022.  This was discontinued secondary to disease progression. 3) Systemic treatment with nivolumab 480 Mg IV every 4 weeks in addition to Cabometyx 40 mg p.o. daily.  First dose June 18, 2022.  Status post 7 cycles.  His Cabometyx has been on hold recently due to adverse side effects. His dose was reduced in May to 20 mg p.o. daily but he continues to have weakness, diarrhea, and dehydration. This has been on hold since 5/19.  Last dose was given December 02, 2022 discontinued secondary to disease progression   CURRENT THERAPY: Starting 14 mg of  lenvatinib plus everolimus 5 mg on 02/02/23   INTERVAL HISTORY: Micheal Mcintyre 83 y.o. male returns to the clinic today for follow-up visit accompanied by his wife. Discussed the use of AI scribe software for clinical note transcription with the patient, who gave verbal consent to proceed.  History of Present Illness   The patient, an 83 year old individual diagnosed with metastatic papillary renal cell carcinoma in February 2024, is currently undergoing treatment with lenvatinib and everolimus. The patient reports intermittent episodes of severe diarrhea, which occur approximately every three weeks. These episodes typically begin at night, causing the patient to wake up every couple of hours. Each episode lasts for about a day and a half, during which the patient  experiences significant weakness and occasional dizziness.  The patient also reports a decrease in appetite and a weight loss of 5 pounds, which is concerning given his already 30-pound weight loss. The patient's hemoglobin levels have been decreasing, from 14.7 in October to 9.8 currently, which may be contributing to increased fatigue. The patient denies any blood in the stool or urine.  The patient has been in consultation with a radiation oncologist at [facility] to discuss the possibility of proton therapy. However, it was determined that this treatment may not be suitable for his disease.  The patient's blood pressure and creatinine levels have been fluctuating, likely due to the cancer medication. The patient has been managing these fluctuations with the help of a nephrologist and primary care physician. The patient's creatinine level has improved from 1.88 to 1.24, which is within the normal range. The patient's blood pressure is currently 137/84, which is within the acceptable range given his age and kidney condition.       MEDICAL HISTORY: Past Medical History:  Diagnosis Date   Arthritis    GERD (gastroesophageal reflux disease)    Hypertension    Medicare annual wellness visit, subsequent 08/19/2014   Osteoarthritis 09/21/2007   Qualifier: Diagnosis of  By: Alphonzo Severance MD, Ivar Drape R    PONV (postoperative nausea and vomiting)    Preventative health care 06/07/2016   Right shoulder pain 12/03/2016    ALLERGIES:  is allergic to statins.  MEDICATIONS:  Current Outpatient Medications  Medication Sig Dispense Refill   acetaminophen (TYLENOL) 500  MG tablet Take 1,000 mg by mouth every 6 (six) hours as needed for moderate pain.     aluminum hydroxide-magnesium carbonate (GAVISCON) 95-358 MG/15ML SUSP Take 15 mLs by mouth as needed for heartburn or indigestion.     amoxicillin-clavulanate (AUGMENTIN) 500-125 MG tablet Take 1 tablet by mouth every 8 (eight) hours. (Patient not  taking: Reported on 03/04/2023)     Cholecalciferol (VITAMIN D3) 10 MCG (400 UNIT) tablet Take 800 Units by mouth daily.     diphenoxylate-atropine (LOMOTIL) 2.5-0.025 MG tablet TAKE 1 TABLET BY MOUTH FOUR TIMES DAILY AS NEEDED FOR DIARRHEA OR LOOSE STOOLS 30 tablet 0   everolimus (AFINITOR) 5 MG tablet Take 1 tablet (5 mg total) by mouth daily. 30 tablet 2   lenvatinib 14 mg daily dose (LENVIMA) 10 & 4 MG capsule Take 14 mg by mouth daily. (Take one 10mg  capsule and one 4mg  capsule for a total dose of 14mg ) 60 capsule 2   levothyroxine (SYNTHROID) 50 MCG tablet TAKE 1 TABLET(50 MCG) BY MOUTH DAILY BEFORE BREAKFAST 30 tablet 1   magic mouthwash SOLN Take 5 mLs by mouth 3 (three) times daily as needed for mouth pain. Suspension contains equal amounts of Maalox Extra Strength, nystatin, and diphenhydramine. (Patient not taking: Reported on 03/22/2023) 240 mL 0   magnesium chloride (SLOW-MAG) 64 MG TBEC SR tablet Take 1 tablet (64 mg total) by mouth 2 (two) times daily. (Patient not taking: Reported on 03/04/2023) 60 tablet 0   Multiple Vitamin (MULTIVITAMIN WITH MINERALS) TABS tablet Take 1 tablet by mouth 4 (four) times a week. (Patient not taking: Reported on 03/22/2023)     ondansetron (ZOFRAN) 8 MG tablet Take 1 tablet (8 mg total) by mouth every 8 (eight) hours as needed for nausea or vomiting. 30 tablet 2   rosuvastatin (CRESTOR) 20 MG tablet Take 0.5 tablets (10 mg total) by mouth daily. (Patient not taking: Reported on 01/27/2023) 90 tablet 3   telmisartan (MICARDIS) 80 MG tablet TAKE 1/2 TABLET BY MOUTH TWICE DAILY 180 tablet 1   TURMERIC PO Take 1 tablet by mouth 3 (three) times a week. (Patient not taking: Reported on 03/04/2023)     Vitamin D, Ergocalciferol, (DRISDOL) 1.25 MG (50000 UNIT) CAPS capsule Take 1 capsule (50,000 Units total) by mouth every 7 (seven) days. 4 capsule 4   No current facility-administered medications for this visit.    SURGICAL HISTORY:  Past Surgical History:   Procedure Laterality Date   CHOLECYSTECTOMY     HIP SURGERY     JOINT REPLACEMENT     NEPHRECTOMY Right 10/15/2021   Procedure: NEPHRECTOMY, OPEN;  Surgeon: Jannifer Hick, MD;  Location: [facility];  Service: Urology;  Laterality: Right;   nose skin cancer removal     TOTAL HIP ARTHROPLASTY      REVIEW OF SYSTEMS:  Constitutional: positive for anorexia, fatigue, and weight loss Eyes: negative Ears, nose, mouth, throat, and face: negative Respiratory: negative Cardiovascular: negative Gastrointestinal: positive for diarrhea Genitourinary:negative Integument/breast: negative Hematologic/lymphatic: negative Musculoskeletal:positive for muscle weakness Neurological: negative Behavioral/Psych: negative Endocrine: negative Allergic/Immunologic: negative   PHYSICAL EXAMINATION: General appearance: alert, cooperative, fatigued, and no distress Head: Normocephalic, without obvious abnormality, atraumatic Neck: no adenopathy, no JVD, supple, symmetrical, trachea midline, and thyroid not enlarged, symmetric, no tenderness/mass/nodules Lymph nodes: Cervical, supraclavicular, and axillary nodes normal. Resp: clear to auscultation bilaterally Back: symmetric, no curvature. ROM normal. No CVA tenderness. Cardio: regular rate and rhythm, S1, S2 normal, no murmur, click, rub or gallop GI: soft,  non-tender; bowel sounds normal; no masses,  no organomegaly Extremities: extremities normal, atraumatic, no cyanosis or edema Neurologic: Alert and oriented X 3, normal strength and tone. Normal symmetric reflexes. Normal coordination and gait  ECOG PERFORMANCE STATUS: 1 - Symptomatic but completely ambulatory  Blood pressure 137/84, pulse 76, temperature (!) 97.3 F (36.3 C), temperature source Temporal, resp. rate 16, height 5\' 10"  (1.778 m), weight 129 lb 12.8 oz (58.9 kg), SpO2 100%.  LABORATORY DATA: Lab Results  Component Value Date   WBC 3.7 (L) 05/25/2023   HGB 9.8 (L) 05/25/2023   HCT 29.0  (L) 05/25/2023   MCV 86.8 05/25/2023   PLT 152 05/25/2023      Chemistry      Component Value Date/Time   NA 127 (L) 04/22/2023 0945   NA 140 04/22/2022 0000   K 4.1 04/22/2023 0945   CL 95 (L) 04/22/2023 0945   CO2 24 04/22/2023 0945   BUN 26 (H) 04/22/2023 0945   BUN 21 04/22/2022 0000   CREATININE 1.88 (H) 04/22/2023 0945   CREATININE 1.09 02/06/2020 1306   GLU 88 04/22/2022 0000      Component Value Date/Time   CALCIUM 8.8 (L) 04/22/2023 0945   ALKPHOS 66 04/22/2023 0945   AST 21 04/22/2023 0945   ALT 20 04/22/2023 0945   BILITOT 0.9 04/22/2023 0945       RADIOGRAPHIC STUDIES: No results found.   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  He is status post right open radical nephrectomy in June 2023 followed by 9 cycles of treatment with immunotherapy with Keytruda last dose was given May 21, 2022 before it was discontinued secondary to disease progression.  The patient then started systemic chemotherapy with nivolumab and Cabometyx initially at 40 mg p.o. daily but this was reduced to 20 mg p.o. daily before it was discontinued secondary to intolerance.  His treatment with nivolumab was discontinued after 7 cycles secondary to disease progression.  The patient was seen by Dr. Clarene Duke at [facility] cancer center for a second opinion and she recommended for the patient treatment with lenvatinib and everolimus starting at a reduced dose. He is currently on treatment with lenvatinib 14 mg p.o. daily in addition to everolimus 5 mg p.o. daily started February 02, 2023.  He has been tolerating this treatment fairly well except for occasional diarrhea.    Metastatic Papillary Renal Cell Carcinoma Diagnosed in February 2024. Currently on  lenvatinib and everolimus. Reports intermittent severe diarrhea every three weeks, leading to weakness and dizziness, managed with Imodium. Discussed potential radiation therapy for areas not controlled by chemotherapy, but no specific area identified. Proton therapy not indicated for this cancer. Discussed balance between side effects and benefits of current treatment. - Continue lenvatinib and everolimus unless severe diarrhea occurs, then hold for a few days and resume - Use Imodium as needed for diarrhea - Hydrate adequately during diarrhea episodes - Consult radiation oncologist at [facility] for potential radiation therapy options based on his request but can be treated at [facility]. - Follow up in one month  Anemia Hemoglobin decreased from 14.7 in October to 9.8 today. No signs of blood loss in stool or urine. Likely related to cancer treatment and decreased activity. - Start iron supplements and multivitamins -  Monitor hemoglobin levels  Weight Loss Weight down by 5 pounds, total 35 pounds from normal weight. Likely due to decreased appetite and diarrhea. - Increase caloric intake with frequent snacks and protein supplements - Monitor weight  Hypertension Blood pressure fluctuates with cancer treatment. Currently managed with two antihypertensive medications. Blood pressure today is 137/84, within acceptable range. Management coordinated with nephrologist and primary care physician. - Continue current antihypertensive medications - Monitor blood pressure regularly - Adjust medications as needed in consultation with nephrologist and primary care physician  Follow-up - Follow up with nephrologist on February 5 - Follow up with hematology/oncology in one month.   The patient was advised to call immediately if he has any other concerning symptoms in the interval. The patient voices understanding of current disease status and treatment options and is in agreement with the current care  plan.  All questions were answered. The patient knows to call the clinic with any problems, questions or concerns. We can certainly see the patient much sooner if necessary.  The total time spent in the appointment was 55 minutes.  Disclaimer: This note was dictated with voice recognition software. Similar sounding words can inadvertently be transcribed and may not be corrected upon review.

## 2023-05-26 ENCOUNTER — Other Ambulatory Visit: Payer: Self-pay | Admitting: Internal Medicine

## 2023-05-26 ENCOUNTER — Other Ambulatory Visit: Payer: Self-pay

## 2023-05-26 MED ORDER — LENVATINIB (14 MG DAILY DOSE) 10 & 4 MG PO CPPK
14.0000 mg | ORAL_CAPSULE | Freq: Every day | ORAL | 2 refills | Status: DC
  Filled 2023-05-27: qty 60, 30d supply, fill #0
  Filled 2023-06-25: qty 60, 30d supply, fill #1
  Filled 2023-07-20: qty 60, 30d supply, fill #2

## 2023-05-26 MED ORDER — EVEROLIMUS 5 MG PO TABS
5.0000 mg | ORAL_TABLET | Freq: Every day | ORAL | 2 refills | Status: DC
  Filled 2023-05-27: qty 30, 30d supply, fill #0
  Filled 2023-06-25: qty 30, 30d supply, fill #1
  Filled 2023-07-20: qty 30, 30d supply, fill #2

## 2023-05-26 NOTE — Progress Notes (Signed)
Specialty Pharmacy Refill Coordination Note  Micheal Mcintyre is a 83 y.o. male contacted today regarding refills of specialty medication(s) Everolimus (AFINITOR); Lenvatinib Mesylate [facility])   Patient requested Delivery   Delivery date: 06/04/23   Verified address: [address] [place] [address]   Medication will be filled on 06/03/23, pending refill approval.

## 2023-05-27 ENCOUNTER — Other Ambulatory Visit: Payer: Self-pay

## 2023-05-27 DIAGNOSIS — D49511 Neoplasm of unspecified behavior of right kidney: Secondary | ICD-10-CM | POA: Diagnosis not present

## 2023-05-27 DIAGNOSIS — N189 Chronic kidney disease, unspecified: Secondary | ICD-10-CM | POA: Diagnosis not present

## 2023-05-28 ENCOUNTER — Other Ambulatory Visit: Payer: Self-pay

## 2023-06-01 ENCOUNTER — Other Ambulatory Visit: Payer: Self-pay

## 2023-06-01 ENCOUNTER — Other Ambulatory Visit (HOSPITAL_COMMUNITY): Payer: Self-pay

## 2023-06-02 ENCOUNTER — Other Ambulatory Visit (HOSPITAL_COMMUNITY): Payer: Self-pay

## 2023-06-03 ENCOUNTER — Other Ambulatory Visit (HOSPITAL_COMMUNITY): Payer: Self-pay

## 2023-06-03 ENCOUNTER — Other Ambulatory Visit: Payer: Self-pay

## 2023-06-03 ENCOUNTER — Encounter: Payer: Self-pay | Admitting: Internal Medicine

## 2023-06-09 DIAGNOSIS — C641 Malignant neoplasm of right kidney, except renal pelvis: Secondary | ICD-10-CM | POA: Diagnosis not present

## 2023-06-09 DIAGNOSIS — N183 Chronic kidney disease, stage 3 unspecified: Secondary | ICD-10-CM | POA: Diagnosis not present

## 2023-06-09 DIAGNOSIS — I129 Hypertensive chronic kidney disease with stage 1 through stage 4 chronic kidney disease, or unspecified chronic kidney disease: Secondary | ICD-10-CM | POA: Diagnosis not present

## 2023-06-09 DIAGNOSIS — D631 Anemia in chronic kidney disease: Secondary | ICD-10-CM | POA: Diagnosis not present

## 2023-06-10 LAB — LAB REPORT - SCANNED: Creatinine, POC: 128.3 mg/dL

## 2023-06-17 NOTE — Progress Notes (Unsigned)
[facility] OFFICE PROGRESS NOTE  [name], MD 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   PRIOR THERAPY:  1) Status post right open radical nephrectomy completed on October 15, 2021 by Dr. Jettie Pagan.  2) Keytruda 200 Mg IV every 3 weeks started December 03, 2021.  Status post 9 cycles.  Last dose was given May 21, 2022.  This was discontinued secondary to disease progression. 3) Systemic treatment with nivolumab 480 Mg IV every 4 weeks in addition to Cabometyx 40 mg p.o. daily.  First dose June 18, 2022.  Status post 7 cycles.  His Cabometyx has been on hold recently due to adverse side effects. His dose was reduced in May to 20 mg p.o. daily but he continues to have weakness, diarrhea, and dehydration. This has been on hold since 5/19.  Last dose was given December 02, 2022 discontinued secondary to disease progression    CURRENT THERAPY: Starting 14 mg of lenvatinib plus everolimus 5 mg on 02/02/23   INTERVAL HISTORY: Micheal Mcintyre 83 y.o. male returns to the clinic today for a follow up visit accompanied by his wife. The patient was seen by Dr. Arbutus Ped. The patient is currently being treated with lenvatinib. He is on a reduced dose to prior intolerance with cabomeyx in the past. ***Thrombocytopenia *** and hypertension. Diarrhea ***fatigue   ***nephrologist   The patient has been in consultation with a radiation oncologist at [facility] to discuss the possibility of proton therapy. However, it was determined that this treatment may not be suitable for his disease.   Consult radiation oncologist at [facility] for potential radiation therapy options based on his request but can be treated at [facility].   MM recommended her start iron and multivitamins.   Dr.  Arbutus Ped previously discussed holding treamtnet 1-2 days if severe diarrrhea and hydrate well. 30 lb weight loss last appointment  He denies any fever, chills, or night sweats.  He did lose weight since he was last seen.  Denies any changes in his breathing.  He denies any abnormal leading or bruising at this time.  He was having epistaxis last month but has not had any at this time.  He has some mild nausea occasionally but his antiemetics are effective.  He denies any vomiting.  He has some mild swelling which is stable in his lower extremities bilaterally.  He is here today for evaluation and repeat blood work.      MEDICAL HISTORY: Past Medical History:  Diagnosis Date   Arthritis    GERD (gastroesophageal reflux disease)    Hypertension    Medicare annual wellness visit, subsequent 08/19/2014   Osteoarthritis 09/21/2007   Qualifier: Diagnosis of  By: Alphonzo Severance MD, Ivar Drape R    PONV (postoperative nausea and vomiting)    Preventative health care 06/07/2016   Right shoulder pain 12/03/2016    ALLERGIES:  is allergic to statins.  MEDICATIONS:  Current Outpatient Medications  Medication Sig Dispense Refill   acetaminophen (TYLENOL) 500 MG tablet Take 1,000 mg by mouth every 6 (six) hours as needed for moderate pain.     aluminum hydroxide-magnesium carbonate (GAVISCON) 95-358 MG/15ML SUSP Take 15 mLs by mouth as needed for heartburn or indigestion.     amoxicillin-clavulanate (AUGMENTIN) 500-125 MG tablet Take  1 tablet by mouth every 8 (eight) hours. (Patient not taking: Reported on 03/04/2023)     Cholecalciferol (VITAMIN D3) 10 MCG (400 UNIT) tablet Take 800 Units by mouth daily.     diphenoxylate-atropine (LOMOTIL) 2.5-0.025 MG tablet TAKE 1 TABLET BY MOUTH FOUR TIMES DAILY AS NEEDED FOR DIARRHEA OR LOOSE STOOLS 30 tablet 0   everolimus (AFINITOR) 5 MG tablet Take 1 tablet (5 mg total) by mouth daily. 30 tablet 2   lenvatinib 14 mg daily dose (LENVIMA) 10 & 4 MG capsule Take 14 mg  by mouth daily. (Take one 10mg  capsule and one 4mg  capsule for a total dose of 14mg ) 60 capsule 2   levothyroxine (SYNTHROID) 50 MCG tablet TAKE 1 TABLET(50 MCG) BY MOUTH DAILY BEFORE BREAKFAST 30 tablet 1   magic mouthwash SOLN Take 5 mLs by mouth 3 (three) times daily as needed for mouth pain. Suspension contains equal amounts of Maalox Extra Strength, nystatin, and diphenhydramine. (Patient not taking: Reported on 03/22/2023) 240 mL 0   magnesium chloride (SLOW-MAG) 64 MG TBEC SR tablet Take 1 tablet (64 mg total) by mouth 2 (two) times daily. (Patient not taking: Reported on 03/04/2023) 60 tablet 0   Multiple Vitamin (MULTIVITAMIN WITH MINERALS) TABS tablet Take 1 tablet by mouth 4 (four) times a week. (Patient not taking: Reported on 03/22/2023)     ondansetron (ZOFRAN) 8 MG tablet Take 1 tablet (8 mg total) by mouth every 8 (eight) hours as needed for nausea or vomiting. 30 tablet 2   rosuvastatin (CRESTOR) 20 MG tablet Take 0.5 tablets (10 mg total) by mouth daily. (Patient not taking: Reported on 01/27/2023) 90 tablet 3   telmisartan (MICARDIS) 80 MG tablet TAKE 1/2 TABLET BY MOUTH TWICE DAILY 180 tablet 1   TURMERIC PO Take 1 tablet by mouth 3 (three) times a week. (Patient not taking: Reported on 03/04/2023)     Vitamin D, Ergocalciferol, (DRISDOL) 1.25 MG (50000 UNIT) CAPS capsule Take 1 capsule (50,000 Units total) by mouth every 7 (seven) days. 4 capsule 4   No current facility-administered medications for this visit.    SURGICAL HISTORY:  Past Surgical History:  Procedure Laterality Date   CHOLECYSTECTOMY     HIP SURGERY     JOINT REPLACEMENT     NEPHRECTOMY Right 10/15/2021   Procedure: NEPHRECTOMY, OPEN;  Surgeon: Jannifer Hick, MD;  Location: [facility];  Service: Urology;  Laterality: Right;   nose skin cancer removal     TOTAL HIP ARTHROPLASTY      REVIEW OF SYSTEMS:   Review of Systems  Constitutional: Negative for appetite change, chills, fatigue, fever and unexpected  weight change.  HENT:   Negative for mouth sores, nosebleeds, sore throat and trouble swallowing.   Eyes: Negative for eye problems and icterus.  Respiratory: Negative for cough, hemoptysis, shortness of breath and wheezing.   Cardiovascular: Negative for chest pain and leg swelling.  Gastrointestinal: Negative for abdominal pain, constipation, diarrhea, nausea and vomiting.  Genitourinary: Negative for bladder incontinence, difficulty urinating, dysuria, frequency and hematuria.   Musculoskeletal: Negative for back pain, gait problem, neck pain and neck stiffness.  Skin: Negative for itching and rash.  Neurological: Negative for dizziness, extremity weakness, gait problem, headaches, light-headedness and seizures.  Hematological: Negative for adenopathy. Does not bruise/bleed easily.  Psychiatric/Behavioral: Negative for confusion, depression and sleep disturbance. The patient is not nervous/anxious.     PHYSICAL EXAMINATION:  There were no vitals taken for this visit.  ECOG PERFORMANCE STATUS: {CHL ONC  ECOG ZO:[id number]}  Physical Exam  Constitutional: Oriented to person, place, and time and well-developed, well-nourished, and in no distress. No distress.  HENT:  Head: Normocephalic and atraumatic.  Mouth/Throat: Oropharynx is clear and moist. No oropharyngeal exudate.  Eyes: Conjunctivae are normal. Right eye exhibits no discharge. Left eye exhibits no discharge. No scleral icterus.  Neck: Normal range of motion. Neck supple.  Cardiovascular: Normal rate, regular rhythm, normal heart sounds and intact distal pulses.   Pulmonary/Chest: Effort normal and breath sounds normal. No respiratory distress. No wheezes. No rales.  Abdominal: Soft. Bowel sounds are normal. Exhibits no distension and no mass. There is no tenderness.  Musculoskeletal: Normal range of motion. Exhibits no edema.  Lymphadenopathy:    No cervical adenopathy.  Neurological: Alert and oriented to person, place, and  time. Exhibits normal muscle tone. Gait normal. Coordination normal.  Skin: Skin is warm and dry. No rash noted. Not diaphoretic. No erythema. No pallor.  Psychiatric: Mood, memory and judgment normal.  Vitals reviewed.  LABORATORY DATA: Lab Results  Component Value Date   WBC 3.7 (L) 05/25/2023   HGB 9.8 (L) 05/25/2023   HCT 29.0 (L) 05/25/2023   MCV 86.8 05/25/2023   PLT 152 05/25/2023      Chemistry      Component Value Date/Time   NA 135 05/25/2023 1335   NA 140 04/22/2022 0000   K 4.2 05/25/2023 1335   CL 100 05/25/2023 1335   CO2 28 05/25/2023 1335   BUN 18 05/25/2023 1335   BUN 21 04/22/2022 0000   CREATININE 1.24 05/25/2023 1335   CREATININE 1.09 02/06/2020 1306   GLU 88 04/22/2022 0000      Component Value Date/Time   CALCIUM 9.1 05/25/2023 1335   ALKPHOS 88 05/25/2023 1335   AST 37 05/25/2023 1335   ALT 30 05/25/2023 1335   BILITOT 0.7 05/25/2023 1335       RADIOGRAPHIC STUDIES:  No results found.   ASSESSMENT/PLAN:  This is a very pleasant 83 year old Caucasian male diagnosed with metastatic papillary renal cell carcinoma.  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  This was diagnosed in June 2023.  He is status post a right open radical nephrectomy.  Patient was found to have metastatic disease to the liver and bone in February 2024.   The patient had previously underwent adjuvant treatment with Keytruda 20 mg IV every 3 weeks for 9 cycles.   When he saw Dr. Arbutus Ped on 06/11/2022 and reviewed his MRI with him which showed multiple new rim-enhancing lesions throughout the liver consistent metastatic disease.  There was also a rim-enhancing lesion in the inferior endplate of L1 measuring 2.1 x 1.1 cm concerning for metastatic disease.  There is no local recurrence or lymphadenopathy. He also had a chest CT by Dr. Cardell Peach which showed pulmonary metastatic disease.    Dr. Arbutus Ped recommended  that the patient start treatment with nivolumab 480 mg IV every 4 weeks in addition to Cabometyx 40 mg grams p.o. daily.  He started this on 06/18/2022. He is status post 7 cycles of nivolumab.   He then had GI side effects of cabometyx with diarrhea and weight loss. His treatment has periodically been on hold. His dose was reduced to 20 mg in May 2024, but he continued to have intolerance. Therefore, this has been on hold since 5/19. His diarrhea and weight loss have since resolved.    Unfortunately, the patient's restaging  CT scan from August showed disease progression.   The patient saw Dr. Clarene Duke at [facility] for second opinion.  She recommended lenvatinib plus everolimus.  She recommended starting at a small dose reduction of 14 mg in standard dose everolimus standard dose 5 mg given his prior issues with cabo. If tolerates very well, could increase lenvatinib.  He had progression in the future, then she would recommend clinical trial if available versus axitinib. Therefore, he started this on 01/28/23. He has had some mouth sores, thrombocytopenia, mild nausea, hypertension, and sensitivity on the bottom of his feet.***  The patient was seen with Dr. Arbutus Ped today.    Labs were reviewed.  Recommend that he*** continue on the same treatment at the same dose.   I will arrange for restaging CT scan of the chest, abdomen, pelvis prior to his next appointment.  I will order this without contrast.***   We will see him back for evaluation and repeat blood work in *** weeks.      He is going to monitor his BP closely at home.  His blood pressure is managed by his nephrologist.***   ***For his diarrhea I have refilled his Lomotil.  Discussed he can alternate with Lomotil and Imodium if needed.  He will hydrate with water when he returns home.  Besides having the 4 loose bowel movements last night, he denies any other significant diarrhea.  On the contrary, he had significant constipation a few weeks ago.  He will monitor for now.  ***    No orders of the defined types were placed in this encounter.    I spent {CHL ONC TIME VISIT - ZOXWR:[id number]} counseling the patient face to face. The total time spent in the appointment was {CHL ONC TIME VISIT - BJYNW:[id number]}.  Luvenia Cranford L Safiyya Stokes, PA-C 06/17/23

## 2023-06-22 ENCOUNTER — Inpatient Hospital Stay (HOSPITAL_BASED_OUTPATIENT_CLINIC_OR_DEPARTMENT_OTHER): Payer: Medicare Other | Admitting: Physician Assistant

## 2023-06-22 ENCOUNTER — Ambulatory Visit (HOSPITAL_COMMUNITY)
Admission: RE | Admit: 2023-06-22 | Discharge: 2023-06-22 | Disposition: A | Discharge status: Home / Self Care | Payer: PRIVATE HEALTH INSURANCE | Source: Ambulatory Visit | Attending: Physician Assistant | Admitting: Physician Assistant

## 2023-06-22 ENCOUNTER — Inpatient Hospital Stay: Payer: Medicare Other | Attending: Internal Medicine

## 2023-06-22 VITALS — BP 134/72 | HR 74 | Temp 98.0°F | Resp 14 | Wt 120.7 lb

## 2023-06-22 DIAGNOSIS — M549 Dorsalgia, unspecified: Secondary | ICD-10-CM | POA: Insufficient documentation

## 2023-06-22 DIAGNOSIS — M4804 Spinal stenosis, thoracic region: Secondary | ICD-10-CM | POA: Diagnosis not present

## 2023-06-22 DIAGNOSIS — C649 Malignant neoplasm of unspecified kidney, except renal pelvis: Secondary | ICD-10-CM

## 2023-06-22 DIAGNOSIS — C641 Malignant neoplasm of right kidney, except renal pelvis: Secondary | ICD-10-CM

## 2023-06-22 DIAGNOSIS — C787 Secondary malignant neoplasm of liver and intrahepatic bile duct: Secondary | ICD-10-CM | POA: Insufficient documentation

## 2023-06-22 DIAGNOSIS — R0781 Pleurodynia: Secondary | ICD-10-CM | POA: Diagnosis not present

## 2023-06-22 DIAGNOSIS — Z905 Acquired absence of kidney: Secondary | ICD-10-CM | POA: Insufficient documentation

## 2023-06-22 DIAGNOSIS — C7800 Secondary malignant neoplasm of unspecified lung: Secondary | ICD-10-CM | POA: Diagnosis not present

## 2023-06-22 DIAGNOSIS — C7951 Secondary malignant neoplasm of bone: Secondary | ICD-10-CM | POA: Insufficient documentation

## 2023-06-22 DIAGNOSIS — M5134 Other intervertebral disc degeneration, thoracic region: Secondary | ICD-10-CM | POA: Diagnosis not present

## 2023-06-22 LAB — CBC WITH DIFFERENTIAL (CANCER CENTER ONLY)
Abs Immature Granulocytes: 0.01 10*3/uL (ref 0.00–0.07)
Basophils Absolute: 0 10*3/uL (ref 0.0–0.1)
Basophils Relative: 0 %
Eosinophils Absolute: 0 10*3/uL (ref 0.0–0.5)
Eosinophils Relative: 0 %
HCT: 34.2 % — ABNORMAL LOW (ref 39.0–52.0)
Hemoglobin: 11.5 g/dL — ABNORMAL LOW (ref 13.0–17.0)
Immature Granulocytes: 0 %
Lymphocytes Relative: 6 %
Lymphs Abs: 0.4 10*3/uL — ABNORMAL LOW (ref 0.7–4.0)
MCH: 29.1 pg (ref 26.0–34.0)
MCHC: 33.6 g/dL (ref 30.0–36.0)
MCV: 86.6 fL (ref 80.0–100.0)
Monocytes Absolute: 0.8 10*3/uL (ref 0.1–1.0)
Monocytes Relative: 12 %
Neutro Abs: 5.4 10*3/uL (ref 1.7–7.7)
Neutrophils Relative %: 82 %
Platelet Count: 166 10*3/uL (ref 150–400)
RBC: 3.95 MIL/uL — ABNORMAL LOW (ref 4.22–5.81)
RDW: 13.2 % (ref 11.5–15.5)
WBC Count: 6.6 10*3/uL (ref 4.0–10.5)
nRBC: 0 % (ref 0.0–0.2)

## 2023-06-22 LAB — IRON AND IRON BINDING CAPACITY (CC-WL,HP ONLY)
Iron: 21 ug/dL — ABNORMAL LOW (ref 45–182)
Saturation Ratios: 9 % — ABNORMAL LOW (ref 17.9–39.5)
TIBC: 235 ug/dL — ABNORMAL LOW (ref 250–450)
UIBC: 214 ug/dL (ref 117–376)

## 2023-06-22 LAB — CMP (CANCER CENTER ONLY)
ALT: 16 U/L (ref 0–44)
AST: 18 U/L (ref 15–41)
Albumin: 3.8 g/dL (ref 3.5–5.0)
Alkaline Phosphatase: 93 U/L (ref 38–126)
Anion gap: 11 (ref 5–15)
BUN: 22 mg/dL (ref 8–23)
CO2: 31 mmol/L (ref 22–32)
Calcium: 9.3 mg/dL (ref 8.9–10.3)
Chloride: 88 mmol/L — ABNORMAL LOW (ref 98–111)
Creatinine: 1.39 mg/dL — ABNORMAL HIGH (ref 0.61–1.24)
GFR, Estimated: 51 mL/min — ABNORMAL LOW (ref >60)
Glucose, Bld: 111 mg/dL — ABNORMAL HIGH (ref 70–99)
Potassium: 3.3 mmol/L — ABNORMAL LOW (ref 3.5–5.1)
Sodium: 130 mmol/L — ABNORMAL LOW (ref 135–145)
Total Bilirubin: 0.7 mg/dL (ref 0.0–1.2)
Total Protein: 7.1 g/dL (ref 6.5–8.1)

## 2023-06-22 LAB — LACTATE DEHYDROGENASE: LDH: 157 U/L (ref 98–192)

## 2023-06-22 LAB — FERRITIN: Ferritin: 610 ng/mL — ABNORMAL HIGH (ref 24–336)

## 2023-06-22 MED ORDER — PROCHLORPERAZINE MALEATE 10 MG PO TABS: 10.0000 mg | ORAL_TABLET | Freq: Four times a day (QID) | ORAL | 2 refills | Status: DC | PRN

## 2023-06-23 ENCOUNTER — Other Ambulatory Visit: Payer: Self-pay | Admitting: Internal Medicine

## 2023-06-23 ENCOUNTER — Encounter: Payer: Self-pay | Admitting: Physician Assistant

## 2023-06-25 ENCOUNTER — Other Ambulatory Visit: Payer: Self-pay | Admitting: Physician Assistant

## 2023-06-25 ENCOUNTER — Other Ambulatory Visit: Payer: Self-pay

## 2023-06-25 ENCOUNTER — Telehealth: Payer: Self-pay | Admitting: Medical Oncology

## 2023-06-25 DIAGNOSIS — G893 Neoplasm related pain (acute) (chronic): Secondary | ICD-10-CM

## 2023-06-25 MED ORDER — TRAMADOL HCL 50 MG PO TABS
50.0000 mg | ORAL_TABLET | Freq: Two times a day (BID) | ORAL | 0 refills | Status: DC | PRN
Start: 2023-06-25 — End: 2023-08-17

## 2023-06-25 NOTE — Progress Notes (Signed)
Specialty Pharmacy Ongoing Clinical Assessment Note  Micheal Mcintyre is a 83 y.o. male who is being followed by the specialty pharmacy service for RxSp Oncology   Patient's specialty medication(s) reviewed today: Everolimus (AFINITOR); Lenvatinib Mesylate (LENVIMA)   Missed doses in the last 4 weeks: 0   Patient/Caregiver did not have any additional questions or concerns.   Therapeutic benefit summary: Unable to assess   Adverse events/side effects summary: No adverse events/side effects   Patient's therapy is appropriate to: Continue    Goals Addressed             This Visit's Progress    Stabilization of disease       Patient is on track. Patient will maintain adherence         Follow up:  6 months  Bobette Mo Specialty Pharmacist

## 2023-06-25 NOTE — Telephone Encounter (Signed)
Wife called to say that Pate needs pain medicine -Tramadol now due to his increased pain.

## 2023-06-25 NOTE — Progress Notes (Signed)
Specialty Pharmacy Refill Coordination Note  Micheal Mcintyre is a 83 y.o. male contacted today regarding refills of specialty medication(s) Everolimus (AFINITOR); Lenvatinib Mesylate [facility])   Patient requested Delivery   Delivery date: 06/30/23   Verified address: [address] [place] [address]   Medication will be filled on 06/29/23.

## 2023-06-28 ENCOUNTER — Other Ambulatory Visit: Payer: Self-pay | Admitting: Physician Assistant

## 2023-06-28 ENCOUNTER — Other Ambulatory Visit: Payer: Self-pay | Admitting: Medical Oncology

## 2023-06-28 ENCOUNTER — Telehealth: Payer: Self-pay

## 2023-06-28 DIAGNOSIS — R630 Anorexia: Secondary | ICD-10-CM

## 2023-06-28 DIAGNOSIS — R638 Other symptoms and signs concerning food and fluid intake: Secondary | ICD-10-CM

## 2023-06-28 DIAGNOSIS — R5383 Other fatigue: Secondary | ICD-10-CM

## 2023-06-28 MED ORDER — METHYLPREDNISOLONE 4 MG PO TBPK: ORAL_TABLET | ORAL | 0 refills | Status: DC

## 2023-06-28 NOTE — Telephone Encounter (Signed)
 Spoke with patients wife.  She states that patient is weak and struggling to walk so using walker for assistance. Wife states Dr. Thedore Mins (nephrologist) prescribed patient diuretic because feet were swelling and since then, she has noticed a decline in the patient. Informed patient to call Dr. Doristine Church office and she states she called this morning and waiting for a return call.  Wife states patient took tramadol for pain on Saturday and he stated "this is the worst I've ever felt". Wife told him to not take it anymore if it made him feel that way, but patient took 1/2 a Tramadol on Sunday and patient felt okay. Wife states that patient has diarrhea and takes imodium for relief but doesn't always work.  Informed patient that Cassie, PA's last note states he can alternate between lomotil and imodium so she said he will try that.  Wife states patient is nauseous that's why he wont eat or drink much.  Informed patient to take Zofran and Compazine as needed so he can tolerate eating and drinking. Wife states that this same situation occurred last spring/summer and IVF's helped. Per Cassie, PA- patient can receive IVF's. Sent in Medrol dosepak to pharmacy. Wife states that patient has scan on 2/26 at [facility] and was wondering if patient could get some IVF's the same day.  Waiting on charge nurse's response on fluids and will call wife with update.   Informed wife to encourage patient to take nausea meds so he can eat and drink, take lomotil and imodium as needed for diarrhea, take 1/2 tramadol for pain as needed since that worked better than 1, and take BP daily. Call our office with any new concerns or questions.

## 2023-06-29 ENCOUNTER — Other Ambulatory Visit (HOSPITAL_COMMUNITY): Payer: Self-pay

## 2023-06-29 ENCOUNTER — Encounter: Payer: Self-pay | Admitting: Internal Medicine

## 2023-06-29 ENCOUNTER — Other Ambulatory Visit: Payer: Self-pay

## 2023-06-29 NOTE — Telephone Encounter (Signed)
 Spoke with patients wife this morning and confirmed IVF's at 1230 tomorrow at [facility].  Patients wife verbalized understanding and was appreciative.

## 2023-06-30 ENCOUNTER — Ambulatory Visit (HOSPITAL_COMMUNITY)
Admission: RE | Admit: 2023-06-30 | Discharge: 2023-06-30 | Discharge status: Home / Self Care | Payer: Medicare Other | Source: Ambulatory Visit | Attending: Physician Assistant

## 2023-06-30 ENCOUNTER — Inpatient Hospital Stay: Payer: PRIVATE HEALTH INSURANCE

## 2023-06-30 VITALS — BP 129/72 | HR 73 | Temp 97.5°F | Resp 16

## 2023-06-30 DIAGNOSIS — C787 Secondary malignant neoplasm of liver and intrahepatic bile duct: Secondary | ICD-10-CM | POA: Diagnosis not present

## 2023-06-30 DIAGNOSIS — K769 Liver disease, unspecified: Secondary | ICD-10-CM | POA: Diagnosis not present

## 2023-06-30 DIAGNOSIS — C641 Malignant neoplasm of right kidney, except renal pelvis: Secondary | ICD-10-CM | POA: Insufficient documentation

## 2023-06-30 DIAGNOSIS — M549 Dorsalgia, unspecified: Secondary | ICD-10-CM

## 2023-06-30 DIAGNOSIS — E860 Dehydration: Secondary | ICD-10-CM

## 2023-06-30 DIAGNOSIS — I700 Atherosclerosis of aorta: Secondary | ICD-10-CM | POA: Diagnosis not present

## 2023-06-30 DIAGNOSIS — Z905 Acquired absence of kidney: Secondary | ICD-10-CM | POA: Diagnosis not present

## 2023-06-30 DIAGNOSIS — C7800 Secondary malignant neoplasm of unspecified lung: Secondary | ICD-10-CM | POA: Diagnosis not present

## 2023-06-30 DIAGNOSIS — C7951 Secondary malignant neoplasm of bone: Secondary | ICD-10-CM | POA: Diagnosis not present

## 2023-06-30 MED ORDER — SODIUM CHLORIDE 0.9 % IV SOLN: Freq: Once | INTRAVENOUS | Status: AC

## 2023-06-30 NOTE — Patient Instructions (Signed)

## 2023-07-04 NOTE — Progress Notes (Unsigned)
 [facility] OFFICE PROGRESS NOTE  [name], MD 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   PRIOR THERAPY: 1) Status post right open radical nephrectomy completed on October 15, 2021 by Dr. Jettie Pagan.  2) Keytruda 200 Mg IV every 3 weeks started December 03, 2021.  Status post 9 cycles.  Last dose was given May 21, 2022.  This was discontinued secondary to disease progression. 3) Systemic treatment with nivolumab 480 Mg IV every 4 weeks in addition to Cabometyx 40 mg p.o. daily.  First dose June 18, 2022.  Status post 7 cycles.  His Cabometyx has been on hold recently due to adverse side effects. His dose was reduced in May to 20 mg p.o. daily but he continues to have weakness, diarrhea, and dehydration. This has been on hold since 5/19.  Last dose was given December 02, 2022 discontinued secondary to disease progression  CURRENT THERAPY: Starting 14 mg of lenvatinib plus everolimus 5 mg on 02/02/23. On hold since 07/07/23.   INTERVAL HISTORY: Micheal Mcintyre 83 y.o. male returns to the clinic today for a follow up visit accompanied by his wife and a family member. The patient was last seen by myself at his last appointment on 06/22/23. At his last appointment, he was endorsing back pain after pulling himself out of a care.  He had an x-ray performed which showed multilevel degenerative changes in his spine.  He was given tramadol but it did not agree with him and made him feel lightheaded.  Therefore he has discontinued this.  He came to the clinic for IV fluid.  He also continued to endorse poor appetite and weight loss. He is not interested in appetite stimulants. He tried remeron in the past and he did not like the way it made him feel. We decided to arrange for his repeat  imaging sooner.   He called in the interval and was given a medrol dosepack for his fatigue and decreased appetite which helped his symptoms while he was taking it. He has since completed the steroid and is back to his typical poor appetite and fatigue. He lost about 3 more pounds since he was last seen. He is drinking two boost breezes per day.  He is not very interested in food.   Is taking iron pills for his anemia but has since discontinued.  He is going to resume this again.  He saw his nephrologist recently who adjusted his blood pressure medications again.  They are requesting a copy of his labs be sent to his nephrologist today.  Patient's imaging shows possible atypical infection.  The patient denies any known sick contacts, fevers, chills, or night sweats.  He denies any significant shortness of breath but does move slowly when climbing the stairs.  He has a stable cough which has been present since starting lenvatinib several months ago but has not noticed any significant changes.  Per his wife he may be coughing "a tad more".  Denies any hemoptysis.  He has antiemetics for nausea.  He has been taking this more regularly which has been helping.  Denies any vomiting.  His diarrhea has improved since his last visit.  He is actually struggling with more constipation at this time.  He recently had a restaging CT scan  performed.  He is here today for evaluation and to review the scan results.   MEDICAL HISTORY: Past Medical History:  Diagnosis Date   Arthritis    GERD (gastroesophageal reflux disease)    Hypertension    Medicare annual wellness visit, subsequent 08/19/2014   Osteoarthritis 09/21/2007   Qualifier: Diagnosis of  By: Alphonzo Severance MD, Ivar Drape R    PONV (postoperative nausea and vomiting)    Preventative health care 06/07/2016   Right shoulder pain 12/03/2016    ALLERGIES:  is allergic to statins.  MEDICATIONS:  Current Outpatient Medications  Medication Sig Dispense Refill    acetaminophen (TYLENOL) 500 MG tablet Take 1,000 mg by mouth every 6 (six) hours as needed for moderate pain.     aluminum hydroxide-magnesium carbonate (GAVISCON) 95-358 MG/15ML SUSP Take 15 mLs by mouth as needed for heartburn or indigestion.     amoxicillin-clavulanate (AUGMENTIN) 500-125 MG tablet Take 1 tablet by mouth every 8 (eight) hours. (Patient not taking: Reported on 03/04/2023)     Cholecalciferol (VITAMIN D3) 10 MCG (400 UNIT) tablet Take 800 Units by mouth daily.     diphenoxylate-atropine (LOMOTIL) 2.5-0.025 MG tablet TAKE 1 TABLET BY MOUTH FOUR TIMES DAILY AS NEEDED FOR DIARRHEA OR LOOSE STOOLS 30 tablet 0   everolimus (AFINITOR) 5 MG tablet Take 1 tablet (5 mg total) by mouth daily. 30 tablet 2   lenvatinib 14 mg daily dose (LENVIMA) 10 & 4 MG capsule Take 14 mg by mouth daily. (Take one 10mg  capsule and one 4mg  capsule for a total dose of 14mg ) 60 capsule 2   levothyroxine (SYNTHROID) 50 MCG tablet TAKE 1 TABLET(50 MCG) BY MOUTH DAILY BEFORE BREAKFAST 30 tablet 1   magic mouthwash SOLN Take 5 mLs by mouth 3 (three) times daily as needed for mouth pain. Suspension contains equal amounts of Maalox Extra Strength, nystatin, and diphenhydramine. (Patient not taking: Reported on 03/22/2023) 240 mL 0   magnesium chloride (SLOW-MAG) 64 MG TBEC SR tablet Take 1 tablet (64 mg total) by mouth 2 (two) times daily. (Patient not taking: Reported on 03/04/2023) 60 tablet 0   methylPREDNISolone (MEDROL DOSEPAK) 4 MG TBPK tablet Use as instructed 21 tablet 0   Multiple Vitamin (MULTIVITAMIN WITH MINERALS) TABS tablet Take 1 tablet by mouth 4 (four) times a week. (Patient not taking: Reported on 03/22/2023)     ondansetron (ZOFRAN) 8 MG tablet Take 1 tablet (8 mg total) by mouth every 8 (eight) hours as needed for nausea or vomiting. 30 tablet 2   prochlorperazine (COMPAZINE) 10 MG tablet Take 1 tablet (10 mg total) by mouth every 6 (six) hours as needed. 30 tablet 2   rosuvastatin (CRESTOR) 20 MG  tablet Take 0.5 tablets (10 mg total) by mouth daily. (Patient not taking: Reported on 01/27/2023) 90 tablet 3   telmisartan (MICARDIS) 80 MG tablet TAKE 1/2 TABLET BY MOUTH TWICE DAILY 180 tablet 1   traMADol (ULTRAM) 50 MG tablet Take 1 tablet (50 mg total) by mouth every 12 (twelve) hours as needed. 15 tablet 0   TURMERIC PO Take 1 tablet by mouth 3 (three) times a week. (Patient not taking: Reported on 03/04/2023)     Vitamin D, Ergocalciferol, (DRISDOL) 1.25 MG (50000 UNIT) CAPS capsule Take 1 capsule (50,000 Units total) by mouth every 7 (seven) days. 4 capsule 4   No current facility-administered medications for this visit.    SURGICAL HISTORY:  Past Surgical History:  Procedure Laterality Date   CHOLECYSTECTOMY  HIP SURGERY     JOINT REPLACEMENT     NEPHRECTOMY Right 10/15/2021   Procedure: NEPHRECTOMY, OPEN;  Surgeon: Jannifer Hick, MD;  Location: [facility];  Service: Urology;  Laterality: Right;   nose skin cancer removal     TOTAL HIP ARTHROPLASTY      REVIEW OF SYSTEMS:   Constitutional: Positive for stable fatigue, appetite change, and weight loss. Negative for chills and fever.   HENT: Negative for mouth sores, nosebleeds, and trouble swallowing.   Eyes: Negative for eye problems and icterus.  Respiratory: Positive for stable cough and mild dyspnea with exertion. Negative for hemoptysis and wheezing.   Cardiovascular: Positive for left lateral rib pain. Negative for chest pain. Improved leg swelling.  Gastrointestinal: Positive for constipation. Negative for abdominal pain,  nausea and vomiting.  Genitourinary: Negative for bladder incontinence, difficulty urinating, dysuria, frequency and hematuria.   Musculoskeletal: Positive for scoliosis and back pain. Negative for gait problem, neck pain and neck stiffness.  Skin: Negative for itching and rash.  Neurological: Negative for dizziness, extremity weakness, gait problem, headaches, light-headedness and seizures.   Hematological: Negative for adenopathy. Does not bruise/bleed easily.  Psychiatric/Behavioral: Negative for confusion, depression and sleep disturbance. The patient is not nervous/anxious.        PHYSICAL EXAMINATION:  There were no vitals taken for this visit.  ECOG PERFORMANCE STATUS: 2-3  Physical Exam  Constitutional: Oriented to person, place, and time and thin appearing male, and in no distress.  HENT:  Head: Normocephalic and atraumatic.  Mouth/Throat: Oropharynx is clear and moist. No oropharyngeal exudate.  Eyes: Conjunctivae are normal. Right eye exhibits no discharge. Left eye exhibits no discharge. No scleral icterus.  Neck: Normal range of motion. Neck supple.  Cardiovascular: Normal rate, regular rhythm, normal heart sounds and intact distal pulses.   Pulmonary/Chest: Effort normal and breath sounds normal. No respiratory distress. No wheezes. No rales.  Abdominal: Soft. Bowel sounds are normal. Exhibits no distension and no mass. There is no tenderness.  Musculoskeletal: Positive for scoliosis. Normal range of motion. No edema. Lymphadenopathy:    No cervical adenopathy.  Neurological: Alert and oriented to person, place, and time. Exhibits normal muscle tone. Walks with a limp/cane. Examined in the wheelchair today.  Skin: Skin is warm and dry. No rash noted. Not diaphoretic. No erythema. No pallor.  Psychiatric: Mood, memory and judgment normal.  Vitals reviewed.  LABORATORY DATA: Lab Results  Component Value Date   WBC 6.6 06/22/2023   HGB 11.5 (L) 06/22/2023   HCT 34.2 (L) 06/22/2023   MCV 86.6 06/22/2023   PLT 166 06/22/2023      Chemistry      Component Value Date/Time   NA 130 (L) 06/22/2023 1406   NA 140 04/22/2022 0000   K 3.3 (L) 06/22/2023 1406   CL 88 (L) 06/22/2023 1406   CO2 31 06/22/2023 1406   BUN 22 06/22/2023 1406   BUN 21 04/22/2022 0000   CREATININE 1.39 (H) 06/22/2023 1406   CREATININE 1.09 02/06/2020 1306   GLU 88 04/22/2022 0000       Component Value Date/Time   CALCIUM 9.3 06/22/2023 1406   ALKPHOS 93 06/22/2023 1406   AST 18 06/22/2023 1406   ALT 16 06/22/2023 1406   BILITOT 0.7 06/22/2023 1406       RADIOGRAPHIC STUDIES:  DG Ribs Unilateral Left Result Date: 06/22/2023 CLINICAL DATA:  Lateral left rib pain. Known renal cell carcinoma. Evaluate for metastatic bone lesions. Approximately 2 weeks ago  use left hand to reach up and gravid are handled exits. Pain since then across back between shoulder blades, left-greater-than-right. Left axillary rib pain. EXAM: LEFT RIBS - 2 VIEW COMPARISON:  AP chest 09/26/2022, CT chest 04/05/2023 FINDINGS: A marker is seen lateral to the lateral left sixth rib. No rib cortical destruction is seen. No acute fracture is seen. No pathologic lesion is identified. Mild multilevel thoracic spine disc space narrowing with moderate peripheral endplate osteophytes. Cardiac silhouette and mediastinal contours are within normal limits. The lungs are clear. No pleural effusion or pneumothorax. 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 IMPRESSION: 1. No acute fracture or pathologic left rib lesion is identified. 2. 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 Electronically Signed   By: Neita Garnet M.D.   On: 06/22/2023 18:06   DG Thoracic Spine 2 View Result Date: 06/22/2023 CLINICAL DATA:  Back pain. Metastatic renal cell carcinoma. Assess for fracture or etiology of pain. Approximately 2 weeks ago use left hand reach up and gravid car handle to exit and pain since then across back between shoulder blades, left-greater-than-right. Left-sided axillary rib pain. EXAM: THORACIC SPINE 2 VIEWS COMPARISON:  CT chest, abdomen, and pelvis 04/05/2023 FINDINGS: There are 12 rib-bearing thoracic type vertebral bodies. Vertebral body heights are maintained. Mild multilevel disc space narrowing with moderate anterior bridging osteophytes, similar to recent CT. No  definite pathologic bone lesion is seen. The visualized heart and lungs are unremarkable. IMPRESSION: Mild-to-moderate multilevel degenerative disc and endplate changes. No acute fracture. Electronically Signed   By: Neita Garnet M.D.   On: 06/22/2023 18:04     ASSESSMENT/PLAN:  This is a very pleasant 83 year old Caucasian male diagnosed with metastatic papillary renal cell carcinoma.  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  This was diagnosed in June 2023.  He is status post a right open radical nephrectomy.  Patient was found to have metastatic disease to the liver and bone in February 2024.   The patient had previously underwent adjuvant treatment with Keytruda 20 mg IV every 3 weeks for 9 cycles.   When he saw Dr. Arbutus Ped on 06/11/2022 and reviewed his MRI with him which showed multiple new rim-enhancing lesions throughout the liver consistent metastatic disease.  There was also a rim-enhancing lesion in the inferior endplate of L1 measuring 2.1 x 1.1 cm concerning for metastatic disease.  There is no local recurrence or lymphadenopathy. He also had a chest CT by Dr. Cardell Peach which showed pulmonary metastatic disease.    Dr. Arbutus Ped recommended that the patient start treatment with nivolumab 480 mg IV every 4 weeks in addition to Cabometyx 40 mg grams p.o. daily.  He started this on 06/18/2022. He is status post 7 cycles of nivolumab.   He then had GI side effects of cabometyx with diarrhea and weight loss. His treatment has periodically been on hold. His dose was reduced to 20 mg in May 2024, but he continued to have intolerance. Therefore, this has been on hold since 5/19. His diarrhea and weight loss have since resolved.   Unfortunately, the patient's restaging CT scan from August showed disease progression.   The patient saw Dr. Clarene Duke at [facility] for second opinion.  She recommended lenvatinib plus everolimus.  She  recommended starting at a small dose reduction of 14 mg in standard dose everolimus standard dose 5 mg given his prior issues with cabo. If tolerates very well, could increase lenvatinib.  He had progression  in the future, then she would recommend clinical trial if available versus axitinib. Therefore, he started this on 01/28/23. He has had some mouth sores, thrombocytopenia, mild nausea, hypertension, and sensitivity on the bottom of his feet.  The patient was seen with Dr. Arbutus Ped today.  Dr. Arbutus Ped personally and independently reviewed the scan and discussed results with the patient today.  The scan showed able small bilateral pulmonary nodules, minimally diminished in size liver lesions, and slightly diminished pancreatic tail lesion consistent with treatment response.  However the scan showed new large opacity in the peripheral right upper lobe and left upper lobe which is felt to be consistent with multifocal infection particularly fungal or atypical infection.  Of course, Dr. Arbutus Ped explained that he cannot rule out malignancy although it appears most consistent with infection.    Dr. Arbutus Ped recommends holding his treatment with Afinitor and Lenvima at this time.  Dr. Arbutus Ped recommends 2-week course of antibiotics with doxycycline 100 mg twice daily.  We will also refer the patient to pulmonary medicine.  Should there not be improvement with the antibiotics, then we would see if they would recommend bronchoscopy.   We talked about high-calorie high-protein foods and small frequent meals for his decreased appetite and weight loss.  He will continue drinking boost breeze.  The Medrol Dosepak did help the patient's appetite and fatigue however, with his current infection we would not recommend repeating the Medrol Dosepak at this time.  He likely would be a poor candidate for Marinol as I am concerned it would exacerbate his lightheadedness and fatigue.  He tried Remeron in the past and he did not like  the way it made him feel.  We discussed Megace but the patient is not interested at this time due to side effect profile.  We will fax a copy of his CMP to his nephrologist today to avoid duplicate lab draws.  He will see the patient back in 3 weeks for evaluation and repeat blood work and to discuss the next steps.  The patient was advised to call us if he has not heard from pulmonary medicine about a consultation in 1 week.  The patient was advised to call immediately if he has any concerning symptoms in the interval. The patient voices understanding of current disease status and treatment options and is in agreement with the current care plan. All questions were answered. The patient knows to call the clinic with any problems, questions or concerns. We can certainly see the patient much sooner if necessary  No orders of the defined types were placed in this encounter.    Bekah Igoe L Montavious Wierzba, PA-C 07/04/23  ADDENDUM: Hematology/Oncology Attending:  I had a face-to-face encounter with the patient today.  I reviewed his record, lab, scan and recommended his care plan.  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  He underwent several treatment in the past including open radical nephrectomy followed by 9 cycles of treatment with adjuvant Keytruda discontinued secondary to disease progression then the patient treated with nivolumab and Cabometyx for 7 cycles but he could not tolerate the higher dose of Cabometyx which was reduced to 20 mg p.o. daily and this treatment was discontinued in July 2024 secondary to disease progression.  He is currently on treatment with lenvatinib 14 milligram  p.o. daily in addition to Afinitor 5 mg p.o. daily since February 02, 2023.  He has been  tolerating the treatment well except for the increasing fatigue and weakness as well as lack of appetite and weight loss.  She was given IV fluid in the clinic few times in the past he was also treated recently with Medrol Dosepak which improved his fatigue.  The patient had repeat CT scan of the chest, abdomen and pelvis performed recently.  I personally and independently reviewed the scan images and discussed the result and showed the images to the patient and his family.  His scan showed numerous small bilateral pulmonary nodules that are not appreciably changed.  There was new large opacity in the peripheral right upper lobe with a central nodule zone of groundglass and peripheral rim of consolidation measuring 3.6 x 3.5 cm in addition to new mixed groundglass and consolidative opacity in the peripheral left upper lobe measuring 4.3 x 3.0 cm these findings are very concerning for multifocal infection especially fungal or other atypical infection.  His liver lesions are minimally diminished in size consistent with improvement of the treated metastasis.  The soft tissue nodule of the tip of the pancreatic tail is slightly diminished in size again consistent with treatment response. I had a lengthy discussion with the patient and his family about his condition and treatment options.  I strongly recommend for the patient to hold his current treatment for now because of the concern about the atypical infection but also could be fungal in nature. We will start the patient empirically on doxycycline 100 mg p.o. twice daily.  Will also refer him to pulmonary medicine for evaluation and consideration of bronchoscopy and biopsy of this lesion to rule out any fungal or other atypical infection/metastatic disease which is unlikely. We will see the patient back for follow-up visit in around 3 weeks for evaluation based on the recommendation of the pulmonary medicine. He was advised to call immediately if he has any other  concerning symptoms in the interval. The total time spent in the appointment was 30 minutes. Disclaimer: This note was dictated with voice recognition software. Similar sounding words can inadvertently be transcribed and may be missed upon review. Lajuana Matte, MD

## 2023-07-07 ENCOUNTER — Inpatient Hospital Stay: Payer: 59 | Attending: Oncology

## 2023-07-07 ENCOUNTER — Inpatient Hospital Stay (HOSPITAL_BASED_OUTPATIENT_CLINIC_OR_DEPARTMENT_OTHER): Payer: 59 | Admitting: Physician Assistant

## 2023-07-07 VITALS — BP 133/80 | HR 78 | Temp 97.3°F | Resp 15 | Wt 117.4 lb

## 2023-07-07 DIAGNOSIS — R630 Anorexia: Secondary | ICD-10-CM | POA: Diagnosis not present

## 2023-07-07 DIAGNOSIS — C787 Secondary malignant neoplasm of liver and intrahepatic bile duct: Secondary | ICD-10-CM | POA: Insufficient documentation

## 2023-07-07 DIAGNOSIS — R110 Nausea: Secondary | ICD-10-CM | POA: Diagnosis not present

## 2023-07-07 DIAGNOSIS — R0781 Pleurodynia: Secondary | ICD-10-CM | POA: Diagnosis not present

## 2023-07-07 DIAGNOSIS — K219 Gastro-esophageal reflux disease without esophagitis: Secondary | ICD-10-CM | POA: Insufficient documentation

## 2023-07-07 DIAGNOSIS — B449 Aspergillosis, unspecified: Secondary | ICD-10-CM | POA: Insufficient documentation

## 2023-07-07 DIAGNOSIS — Z79899 Other long term (current) drug therapy: Secondary | ICD-10-CM | POA: Diagnosis not present

## 2023-07-07 DIAGNOSIS — J189 Pneumonia, unspecified organism: Secondary | ICD-10-CM | POA: Insufficient documentation

## 2023-07-07 DIAGNOSIS — K5900 Constipation, unspecified: Secondary | ICD-10-CM | POA: Insufficient documentation

## 2023-07-07 DIAGNOSIS — D509 Iron deficiency anemia, unspecified: Secondary | ICD-10-CM | POA: Diagnosis not present

## 2023-07-07 DIAGNOSIS — Z79890 Hormone replacement therapy (postmenopausal): Secondary | ICD-10-CM | POA: Diagnosis not present

## 2023-07-07 DIAGNOSIS — I10 Essential (primary) hypertension: Secondary | ICD-10-CM | POA: Insufficient documentation

## 2023-07-07 DIAGNOSIS — C641 Malignant neoplasm of right kidney, except renal pelvis: Secondary | ICD-10-CM

## 2023-07-07 DIAGNOSIS — R059 Cough, unspecified: Secondary | ICD-10-CM | POA: Diagnosis not present

## 2023-07-07 DIAGNOSIS — C7951 Secondary malignant neoplasm of bone: Secondary | ICD-10-CM | POA: Diagnosis not present

## 2023-07-07 DIAGNOSIS — R197 Diarrhea, unspecified: Secondary | ICD-10-CM | POA: Diagnosis not present

## 2023-07-07 LAB — CMP (CANCER CENTER ONLY)
ALT: 16 U/L (ref 0–44)
AST: 19 U/L (ref 15–41)
Albumin: 3.2 g/dL — ABNORMAL LOW (ref 3.5–5.0)
Alkaline Phosphatase: 87 U/L (ref 38–126)
Anion gap: 8 (ref 5–15)
BUN: 22 mg/dL (ref 8–23)
CO2: 32 mmol/L (ref 22–32)
Calcium: 8.5 mg/dL — ABNORMAL LOW (ref 8.9–10.3)
Chloride: 92 mmol/L — ABNORMAL LOW (ref 98–111)
Creatinine: 1.11 mg/dL (ref 0.61–1.24)
GFR, Estimated: >60 mL/min (ref >60)
Glucose, Bld: 138 mg/dL — ABNORMAL HIGH (ref 70–99)
Potassium: 4 mmol/L (ref 3.5–5.1)
Sodium: 132 mmol/L — ABNORMAL LOW (ref 135–145)
Total Bilirubin: 0.7 mg/dL (ref 0.0–1.2)
Total Protein: 6.4 g/dL — ABNORMAL LOW (ref 6.5–8.1)

## 2023-07-07 LAB — CBC WITH DIFFERENTIAL (CANCER CENTER ONLY)
Abs Immature Granulocytes: 0.03 10*3/uL (ref 0.00–0.07)
Basophils Absolute: 0 10*3/uL (ref 0.0–0.1)
Basophils Relative: 0 %
Eosinophils Absolute: 0 10*3/uL (ref 0.0–0.5)
Eosinophils Relative: 0 %
HCT: 36.1 % — ABNORMAL LOW (ref 39.0–52.0)
Hemoglobin: 12 g/dL — ABNORMAL LOW (ref 13.0–17.0)
Immature Granulocytes: 0 %
Lymphocytes Relative: 6 %
Lymphs Abs: 0.7 10*3/uL (ref 0.7–4.0)
MCH: 29.1 pg (ref 26.0–34.0)
MCHC: 33.2 g/dL (ref 30.0–36.0)
MCV: 87.4 fL (ref 80.0–100.0)
Monocytes Absolute: 0.8 10*3/uL (ref 0.1–1.0)
Monocytes Relative: 7 %
Neutro Abs: 9.6 10*3/uL — ABNORMAL HIGH (ref 1.7–7.7)
Neutrophils Relative %: 87 %
Platelet Count: 146 10*3/uL — ABNORMAL LOW (ref 150–400)
RBC: 4.13 MIL/uL — ABNORMAL LOW (ref 4.22–5.81)
RDW: 13.8 % (ref 11.5–15.5)
WBC Count: 11.1 10*3/uL — ABNORMAL HIGH (ref 4.0–10.5)
nRBC: 0 % (ref 0.0–0.2)

## 2023-07-07 MED ORDER — DOXYCYCLINE HYCLATE 100 MG PO TABS: 100.0000 mg | ORAL_TABLET | Freq: Two times a day (BID) | ORAL | 0 refills | Status: DC

## 2023-07-08 ENCOUNTER — Telehealth: Payer: Self-pay | Admitting: Internal Medicine

## 2023-07-08 NOTE — Telephone Encounter (Signed)
 Patient called to change appointment times. Patient confirmed.

## 2023-07-08 NOTE — Telephone Encounter (Signed)
 Scheduled appointments per 3/5 los notes. The patient is aware of the appointments made via voicemail.

## 2023-07-09 ENCOUNTER — Telehealth: Payer: Self-pay

## 2023-07-09 NOTE — Telephone Encounter (Signed)
 Tried to contact patients wife per [employer].  LVM for return call.

## 2023-07-13 ENCOUNTER — Other Ambulatory Visit: Payer: Self-pay | Admitting: Medical Oncology

## 2023-07-13 ENCOUNTER — Other Ambulatory Visit: Payer: Self-pay

## 2023-07-14 ENCOUNTER — Encounter: Payer: Self-pay | Admitting: Internal Medicine

## 2023-07-14 ENCOUNTER — Other Ambulatory Visit: Payer: Self-pay | Admitting: Nurse Practitioner

## 2023-07-14 DIAGNOSIS — R112 Nausea with vomiting, unspecified: Secondary | ICD-10-CM

## 2023-07-15 ENCOUNTER — Other Ambulatory Visit: Payer: Self-pay

## 2023-07-19 ENCOUNTER — Other Ambulatory Visit: Payer: Self-pay

## 2023-07-20 ENCOUNTER — Other Ambulatory Visit: Payer: Self-pay

## 2023-07-20 NOTE — Progress Notes (Signed)
 Specialty Pharmacy Refill Coordination Note  Micheal Mcintyre is a 83 y.o. male contacted today regarding refills of specialty medication(s) Everolimus (AFINITOR); Lenvatinib Mesylate [facility])   Patient requested (Patient-Rptd) Delivery   Delivery date: (Patient-Rptd) 07/29/23   Verified address: (Patient-Rptd) [address] [place], [place] [address]   Medication will be filled on 03.26.25.

## 2023-07-22 ENCOUNTER — Ambulatory Visit (INDEPENDENT_AMBULATORY_CARE_PROVIDER_SITE_OTHER): Payer: PRIVATE HEALTH INSURANCE | Admitting: Emergency Medicine

## 2023-07-22 ENCOUNTER — Encounter: Payer: Self-pay | Admitting: Internal Medicine

## 2023-07-22 ENCOUNTER — Encounter: Payer: Self-pay | Admitting: Emergency Medicine

## 2023-07-22 VITALS — BP 110/58 | HR 76 | Ht 70.0 in | Wt 118.6 lb

## 2023-07-22 DIAGNOSIS — R9389 Abnormal findings on diagnostic imaging of other specified body structures: Secondary | ICD-10-CM | POA: Diagnosis not present

## 2023-07-22 DIAGNOSIS — C641 Malignant neoplasm of right kidney, except renal pelvis: Secondary | ICD-10-CM

## 2023-07-22 NOTE — Progress Notes (Signed)
 Subjective:    Patient ID: Micheal Mcintyre, male    DOB: 1940-09-14, 83 y.o.   MRN: [id number]  HPI DERYCK HIPPLER is an 83 year old male with metastatic renal cell carcinoma who presents with new mixed density abnormalities on CT scan. He is accompanied by family members. He was referred by Dr. Arbutus Ped for further evaluation of the new mixed density abnormalities on his CT scan.  Recent CT scan of the chest, abdomen, and pelvis revealed stable bilateral pulmonary nodules and new large opacities in the right and left upper lobes, measuring 3.6 by 3.5 cm and 4.3 by 3.0 cm, respectively. These findings prompted the holding of his cancer therapy and initiation of doxycycline for possible pneumonia.  He has a history of metastatic renal cell carcinoma, diagnosed in June 2023, with a right open radical nephrectomy performed. Metastatic disease was identified in February 2024. He has been treated with Keytruda, nivolumab, and Cabometyx, which were difficult to tolerate. In October 2024, he started lenvatinib and everolimus, which were paused on July 07, 2023, due to new pulmonary findings.  He has a long-standing dry cough that began with cancer treatment, recently becoming more pronounced with occasional 'rumble' sounds. No fever, sweats, chills, or other constitutional symptoms. He completed a two-week course of doxycycline, feeling stronger and more mobile, though he still experiences nocturnal coughing fits.  He has experienced significant weight loss, approximately 50 pounds, over the past two years since his surgery.  RADIOLOGY CT chest, abdomen, pelvis: Stable bilateral pulmonary nodules, new large right upper lobe opacity 3.6 x 3.5 cm, new mixed density opacity in left upper lobe 4.3 x 3.0 cm concerning for possible multifocal infection or atypical infection (06/30/2023)   Review of Systems As per HPI  Past Medical History:  Diagnosis Date   Arthritis    GERD (gastroesophageal reflux disease)     Hypertension    Medicare annual wellness visit, subsequent 08/19/2014   Osteoarthritis 09/21/2007   Qualifier: Diagnosis of  By: Alphonzo Severance MD, Ivar Drape R    PONV (postoperative nausea and vomiting)    Preventative health care 06/07/2016   Right shoulder pain 12/03/2016     Family History  Problem Relation Age of Onset   Heart attack Father 1       deceased   Diabetes Maternal Uncle        maternal grandfather   Hypertension Neg Hx    Breast cancer Neg Hx    Colon cancer Neg Hx    Prostate cancer Neg Hx      Social History   Socioeconomic History   Marital status: Married    Spouse name: Not on file   Number of children: Not on file   Years of education: Not on file   Highest education level: Not on file  Occupational History   Not on file  Tobacco Use   Smoking status: Never   Smokeless tobacco: Never  Vaping Use   Vaping status: Never Used  Substance and Sexual Activity   Alcohol use: Yes    Comment: occ   Drug use: No   Sexual activity: Not Currently  Other Topics Concern   Not on file  Social History Narrative   Not on file   Social Drivers of Health   Financial Resource Strain: Low Risk  (01/08/2021)   Overall Financial Resource Strain (CARDIA)    Difficulty of Paying Living Expenses: Not hard at all  Food Insecurity: No Food Insecurity (01/08/2021)  Hunger Vital Sign    Worried About Running Out of Food in the Last Year: Never true    Ran Out of Food in the Last Year: Never true  Transportation Needs: No Transportation Needs (01/08/2021)   PRAPARE - Administrator, Civil Service (Medical): No    Lack of Transportation (Non-Medical): No  Physical Activity: Sufficiently Active (01/08/2021)   Exercise Vital Sign    Days of Exercise per Week: 5 days    Minutes of Exercise per Session: 30 min  Stress: No Stress Concern Present (01/08/2021)   [employer] of Occupational Health - Occupational Stress Questionnaire    Feeling of Stress : Not at  all  Social Connections: Moderately Integrated (01/08/2021)   Social Connection and Isolation Panel [NHANES]    Frequency of Communication with Friends and Family: More than three times a week    Frequency of Social Gatherings with Friends and Family: More than three times a week    Attends Religious Services: More than 4 times per year    Active Member of [employer] or Organizations: No    Attends Banker Meetings: Never    Marital Status: Married  Catering manager Violence: Not At Risk (01/08/2021)   Humiliation, Afraid, Rape, and Kick questionnaire    Fear of Current or Ex-Partner: No    Emotionally Abused: No    Physically Abused: No    Sexually Abused: No    - Never smoker  Allergies  Allergen Reactions   Statins Other (See Comments)    myalgia     Outpatient Medications Prior to Visit  Medication Sig Dispense Refill   acetaminophen (TYLENOL) 500 MG tablet Take 1,000 mg by mouth every 6 (six) hours as needed for moderate pain.     everolimus (AFINITOR) 5 MG tablet Take 1 tablet (5 mg total) by mouth daily. 30 tablet 2   lenvatinib 14 mg daily dose (LENVIMA) 10 & 4 MG capsule Take 14 mg by mouth daily. (Take one 10mg  capsule and one 4mg  capsule for a total dose of 14mg ) 60 capsule 2   levothyroxine (SYNTHROID) 50 MCG tablet TAKE 1 TABLET(50 MCG) BY MOUTH DAILY BEFORE BREAKFAST 30 tablet 1   Multiple Vitamin (MULTIVITAMIN WITH MINERALS) TABS tablet Take 1 tablet by mouth 4 (four) times a week.     ondansetron (ZOFRAN) 8 MG tablet TAKE 1 TABLET(8 MG) BY MOUTH EVERY 8 HOURS AS NEEDED FOR NAUSEA OR VOMITING 30 tablet 2   aluminum hydroxide-magnesium carbonate (GAVISCON) 95-358 MG/15ML SUSP Take 15 mLs by mouth as needed for heartburn or indigestion. (Patient not taking: Reported on 07/22/2023)     amLODipine (NORVASC) 10 MG tablet Take 10 mg by mouth daily. (Patient not taking: Reported on 07/22/2023)     amoxicillin-clavulanate (AUGMENTIN) 500-125 MG tablet Take 1 tablet by mouth  every 8 (eight) hours. (Patient not taking: Reported on 07/22/2023)     Cholecalciferol (VITAMIN D3) 10 MCG (400 UNIT) tablet Take 800 Units by mouth daily. (Patient not taking: Reported on 07/22/2023)     diphenoxylate-atropine (LOMOTIL) 2.5-0.025 MG tablet TAKE 1 TABLET BY MOUTH FOUR TIMES DAILY AS NEEDED FOR DIARRHEA OR LOOSE STOOLS (Patient not taking: Reported on 07/22/2023) 30 tablet 0   doxycycline (VIBRA-TABS) 100 MG tablet Take 1 tablet (100 mg total) by mouth 2 (two) times daily. (Patient not taking: Reported on 07/22/2023) 28 tablet 0   magic mouthwash SOLN Take 5 mLs by mouth 3 (three) times daily as needed for mouth pain.  Suspension contains equal amounts of Maalox Extra Strength, nystatin, and diphenhydramine. (Patient not taking: Reported on 07/22/2023) 240 mL 0   magnesium chloride (SLOW-MAG) 64 MG TBEC SR tablet Take 1 tablet (64 mg total) by mouth 2 (two) times daily. (Patient not taking: Reported on 07/22/2023) 60 tablet 0   methylPREDNISolone (MEDROL DOSEPAK) 4 MG TBPK tablet Use as instructed (Patient not taking: Reported on 07/22/2023) 21 tablet 0   prochlorperazine (COMPAZINE) 10 MG tablet Take 1 tablet (10 mg total) by mouth every 6 (six) hours as needed. (Patient not taking: Reported on 07/22/2023) 30 tablet 2   rosuvastatin (CRESTOR) 20 MG tablet Take 0.5 tablets (10 mg total) by mouth daily. (Patient not taking: Reported on 07/22/2023) 90 tablet 3   telmisartan (MICARDIS) 80 MG tablet TAKE 1/2 TABLET BY MOUTH TWICE DAILY 180 tablet 1   traMADol (ULTRAM) 50 MG tablet Take 1 tablet (50 mg total) by mouth every 12 (twelve) hours as needed. (Patient not taking: Reported on 07/22/2023) 15 tablet 0   TURMERIC PO Take 1 tablet by mouth 3 (three) times a week. (Patient not taking: Reported on 07/22/2023)     Vitamin D, Ergocalciferol, (DRISDOL) 1.25 MG (50000 UNIT) CAPS capsule Take 1 capsule (50,000 Units total) by mouth every 7 (seven) days. (Patient not taking: Reported on 07/22/2023) 4 capsule  4   No facility-administered medications prior to visit.         Objective:   Physical Exam Vitals:   07/22/23 1356  BP: (!) 110/58  Pulse: 76  SpO2: 99%  Weight: 118 lb 9.6 oz (53.8 kg)  Height: 5\' 10"  (1.778 m)    Gen: Pleasant, thin ill appearing man, in no distress,  normal affect  ENT: No lesions,  mouth clear,  oropharynx clear, no postnasal drip  Neck: No JVD, no stridor  Lungs: No use of accessory muscles, distant and coarse  Cardiovascular: RRR, heart sounds normal, no murmur or gallops, no peripheral edema  Musculoskeletal: No deformities, no cyanosis or clubbing  Neuro: alert, awake, non focal  Skin: Warm, no lesions or rash      Assessment & Plan:  Abnormal CT of the chest Pulmonary nodules and opacities Bilateral pulmonary nodular infiltrates with new right upper lobe opacity and left upper lobe mixed density opacity suggest possible multifocal or atypical infection. Differential includes pneumonia, atypical infections, and non-infectious inflammation.  Less likely but also possible malignancy.  Doxycycline initiated for possible pneumonia and just finished this course.  We discussed and considered bronchoscopy now for cultures and biopsies. Planned conservative approach with repeat CT in two weeks. - Order repeat CT scan in two weeks to assess changes in pulmonary opacities. - Consider bronchoscopy if CT scan shows persistent opacities to obtain cultures and biopsies. - Discuss potential risks of bronchoscopy, including pneumothorax, bleeding, and infection. - Evaluate CT scan results and determine the need for bronchoscopy based on findings.   Cancer of kidney (HCC) Metastatic renal cell carcinoma Metastatic renal cell carcinoma with prior nephrectomy and ongoing treatment. Cancer therapy held due to pulmonary findings and potential infection. Coordination with oncology team needed for timing of therapy resumption. - Continue to hold cancer therapy  until further evaluation of pulmonary findings. - Coordinate with oncology team regarding timing of resuming cancer therapy based on pulmonary evaluation results.   Levy Pupa, MD, PhD 07/22/2023, 4:57 PM [facility] [phone] or if no answer before 7:00PM call [phone] For any issues after 7:00PM please call eLink [phone]

## 2023-07-22 NOTE — Assessment & Plan Note (Signed)
 Metastatic renal cell carcinoma Metastatic renal cell carcinoma with prior nephrectomy and ongoing treatment. Cancer therapy held due to pulmonary findings and potential infection. Coordination with oncology team needed for timing of therapy resumption. - Continue to hold cancer therapy until further evaluation of pulmonary findings. - Coordinate with oncology team regarding timing of resuming cancer therapy based on pulmonary evaluation results.

## 2023-07-22 NOTE — Assessment & Plan Note (Signed)
 Pulmonary nodules and opacities Bilateral pulmonary nodular infiltrates with new right upper lobe opacity and left upper lobe mixed density opacity suggest possible multifocal or atypical infection. Differential includes pneumonia, atypical infections, and non-infectious inflammation.  Less likely but also possible malignancy.  Doxycycline initiated for possible pneumonia and just finished this course.  We discussed and considered bronchoscopy now for cultures and biopsies. Planned conservative approach with repeat CT in two weeks. - Order repeat CT scan in two weeks to assess changes in pulmonary opacities. - Consider bronchoscopy if CT scan shows persistent opacities to obtain cultures and biopsies. - Discuss potential risks of bronchoscopy, including pneumothorax, bleeding, and infection. - Evaluate CT scan results and determine the need for bronchoscopy based on findings.

## 2023-07-22 NOTE — Patient Instructions (Signed)
 Today, we discussed your recent CT scan findings and the new abnormalities in your lungs. We reviewed your history of metastatic renal cell carcinoma and the treatments you have undergone. We also talked about the next steps in managing your lung findings and the plan for your cancer therapy.  YOUR PLAN:  -PULMONARY NODULES AND OPACITIES: You have new abnormalities in your lungs that could be due to an infection or inflammation.  You were started doxycycline to treat a possible pneumonia. We will repeat the CT scan in two weeks to see if there are any changes. If the abnormalities persist, we may need to perform a bronchoscopy, a procedure to take samples from your lungs for further testing. We discussed the potential risks of this procedure, including lung collapse, bleeding, and infection.  -METASTATIC RENAL CELL CARCINOMA: Your kidney cancer has spread to other parts of your body.  There has been a pause in your cancer treatment due to the new findings in your lungs. We will coordinate with your oncology team to decide when to resume your cancer therapy based on the results of your lung evaluation.  INSTRUCTIONS:  Please schedule a follow-up appointment in two weeks to review the results of your repeat CT scan. During this appointment, we will discuss the findings and decide on the next steps, including whether a bronchoscopy is needed. Continue to monitor your symptoms and inform us if you experience any new or worsening issues.

## 2023-07-28 ENCOUNTER — Other Ambulatory Visit: Payer: Self-pay

## 2023-08-02 ENCOUNTER — Ambulatory Visit: Payer: PRIVATE HEALTH INSURANCE | Admitting: Internal Medicine

## 2023-08-02 ENCOUNTER — Encounter: Payer: Self-pay | Admitting: Internal Medicine

## 2023-08-02 ENCOUNTER — Other Ambulatory Visit: Payer: PRIVATE HEALTH INSURANCE

## 2023-08-05 ENCOUNTER — Inpatient Hospital Stay: Payer: PRIVATE HEALTH INSURANCE | Attending: Internal Medicine

## 2023-08-05 ENCOUNTER — Inpatient Hospital Stay (HOSPITAL_BASED_OUTPATIENT_CLINIC_OR_DEPARTMENT_OTHER): Payer: PRIVATE HEALTH INSURANCE | Admitting: Internal Medicine

## 2023-08-05 ENCOUNTER — Encounter: Payer: Self-pay | Admitting: Internal Medicine

## 2023-08-05 VITALS — BP 130/65 | HR 71 | Temp 97.8°F | Resp 16 | Ht 70.0 in | Wt 121.2 lb

## 2023-08-05 DIAGNOSIS — I10 Essential (primary) hypertension: Secondary | ICD-10-CM | POA: Diagnosis not present

## 2023-08-05 DIAGNOSIS — D649 Anemia, unspecified: Secondary | ICD-10-CM | POA: Insufficient documentation

## 2023-08-05 DIAGNOSIS — D539 Nutritional anemia, unspecified: Secondary | ICD-10-CM | POA: Diagnosis not present

## 2023-08-05 DIAGNOSIS — R197 Diarrhea, unspecified: Secondary | ICD-10-CM | POA: Insufficient documentation

## 2023-08-05 DIAGNOSIS — Z9049 Acquired absence of other specified parts of digestive tract: Secondary | ICD-10-CM | POA: Diagnosis not present

## 2023-08-05 DIAGNOSIS — C787 Secondary malignant neoplasm of liver and intrahepatic bile duct: Secondary | ICD-10-CM | POA: Insufficient documentation

## 2023-08-05 DIAGNOSIS — C7951 Secondary malignant neoplasm of bone: Secondary | ICD-10-CM | POA: Insufficient documentation

## 2023-08-05 DIAGNOSIS — Z905 Acquired absence of kidney: Secondary | ICD-10-CM | POA: Insufficient documentation

## 2023-08-05 DIAGNOSIS — Z79899 Other long term (current) drug therapy: Secondary | ICD-10-CM | POA: Insufficient documentation

## 2023-08-05 DIAGNOSIS — C641 Malignant neoplasm of right kidney, except renal pelvis: Secondary | ICD-10-CM | POA: Insufficient documentation

## 2023-08-05 DIAGNOSIS — M1990 Unspecified osteoarthritis, unspecified site: Secondary | ICD-10-CM | POA: Insufficient documentation

## 2023-08-05 DIAGNOSIS — E860 Dehydration: Secondary | ICD-10-CM | POA: Insufficient documentation

## 2023-08-05 DIAGNOSIS — Z79890 Hormone replacement therapy (postmenopausal): Secondary | ICD-10-CM | POA: Insufficient documentation

## 2023-08-05 LAB — CBC WITH DIFFERENTIAL (CANCER CENTER ONLY)
Abs Immature Granulocytes: 0.03 10*3/uL (ref 0.00–0.07)
Basophils Absolute: 0.1 10*3/uL (ref 0.0–0.1)
Basophils Relative: 1 %
Eosinophils Absolute: 0 10*3/uL (ref 0.0–0.5)
Eosinophils Relative: 0 %
HCT: 30.4 % — ABNORMAL LOW (ref 39.0–52.0)
Hemoglobin: 9.6 g/dL — ABNORMAL LOW (ref 13.0–17.0)
Immature Granulocytes: 0 %
Lymphocytes Relative: 11 %
Lymphs Abs: 0.9 10*3/uL (ref 0.7–4.0)
MCH: 29.4 pg (ref 26.0–34.0)
MCHC: 31.6 g/dL (ref 30.0–36.0)
MCV: 93 fL (ref 80.0–100.0)
Monocytes Absolute: 0.7 10*3/uL (ref 0.1–1.0)
Monocytes Relative: 8 %
Neutro Abs: 6.7 10*3/uL (ref 1.7–7.7)
Neutrophils Relative %: 80 %
Platelet Count: 297 10*3/uL (ref 150–400)
RBC: 3.27 MIL/uL — ABNORMAL LOW (ref 4.22–5.81)
RDW: 17.2 % — ABNORMAL HIGH (ref 11.5–15.5)
WBC Count: 8.4 10*3/uL (ref 4.0–10.5)
nRBC: 0 % (ref 0.0–0.2)

## 2023-08-05 LAB — CMP (CANCER CENTER ONLY)
ALT: 16 U/L (ref 0–44)
AST: 21 U/L (ref 15–41)
Albumin: 3 g/dL — ABNORMAL LOW (ref 3.5–5.0)
Alkaline Phosphatase: 181 U/L — ABNORMAL HIGH (ref 38–126)
Anion gap: 5 (ref 5–15)
BUN: 13 mg/dL (ref 8–23)
CO2: 32 mmol/L (ref 22–32)
Calcium: 8.7 mg/dL — ABNORMAL LOW (ref 8.9–10.3)
Chloride: 101 mmol/L (ref 98–111)
Creatinine: 1.1 mg/dL (ref 0.61–1.24)
GFR, Estimated: >60 mL/min (ref >60)
Glucose, Bld: 106 mg/dL — ABNORMAL HIGH (ref 70–99)
Potassium: 4.4 mmol/L (ref 3.5–5.1)
Sodium: 138 mmol/L (ref 135–145)
Total Bilirubin: 0.5 mg/dL (ref 0.0–1.2)
Total Protein: 5.9 g/dL — ABNORMAL LOW (ref 6.5–8.1)

## 2023-08-05 NOTE — Progress Notes (Signed)
 [facility] [phone]   [phone]  OFFICE PROGRESS NOTE  [name], MD 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    PRIOR THERAPY: 1) Status post right open radical nephrectomy completed on October 15, 2021 by Dr. Jettie Pagan.  2) Keytruda 200 Mg IV every 3 weeks started December 03, 2021.  Status post 9 cycles.  Last dose was given May 21, 2022.  This was discontinued secondary to disease progression. 3) Systemic treatment with nivolumab 480 Mg IV every 4 weeks in addition to Cabometyx 40 mg p.o. daily.  First dose June 18, 2022.  Status post 7 cycles.  His Cabometyx has been on hold recently due to adverse side effects. His dose was reduced in May to 20 mg p.o. daily but he continues to have weakness, diarrhea, and dehydration. This has been on hold since 5/19.  Last dose was given December 02, 2022 discontinued secondary to disease progression   CURRENT THERAPY: Starting 14 mg of  lenvatinib plus everolimus 5 mg on 02/02/23 his treatment has been on hold for the last months since July 07, 2023.  INTERVAL HISTORY: Micheal Mcintyre 83 y.o. male returns to the clinic today for follow-up visit accompanied by his wife.Discussed the use of AI scribe software for clinical note transcription with the patient, who gave verbal consent to proceed.  History of Present Illness   Micheal Mcintyre is an 83 year old male with metastatic renal cell carcinoma who presents for evaluation and repeat blood work. He is accompanied by his wife.  He was diagnosed with metastatic renal cell carcinoma, papillary renal cell type with rhabdoid features, in February 2024, with metastasis to the liver and bone. He has been on treatment with lenvatinib and everolimus since  October 2024. He has been off his cancer treatment for almost a month due to inflammation observed on a scan, which led to a referral to a pulmonologist. His current medication regimen prior to stopping included lenvatinib at 14 mg and everolimus at 5 mg. He has not resumed these medications pending further evaluation.  The pulmonologist prescribed doxycycline for two weeks, after which his dry cough, a known side effect of his cancer medications, reduced. He is uncertain whether the improvement is due to the antibiotic or the cessation of cancer treatment. He feels better since his last visit, noting an improvement in appetite and a weight gain of approximately two pounds. He also mentions having more strength and is attempting to walk around his house with a walker.  He has experienced a significant drop in hemoglobin from 12.0 g/dL on March 5th to 9.6 g/dL. No bleeding, including epistaxis, gum bleeding, or blood in stool. He has not been taking iron supplements, which were previously recommended due to low iron levels.       MEDICAL HISTORY: Past Medical History:  Diagnosis Date   Arthritis    GERD (gastroesophageal reflux disease)    Hypertension    Medicare annual wellness visit, subsequent 08/19/2014   Osteoarthritis 09/21/2007   Qualifier: Diagnosis of  By: Alphonzo Severance MD, Ivar Drape R    PONV (postoperative nausea and vomiting)    Preventative health care 06/07/2016   Right shoulder pain 12/03/2016    ALLERGIES:  is allergic to  statins.  MEDICATIONS:  Current Outpatient Medications  Medication Sig Dispense Refill   acetaminophen (TYLENOL) 500 MG tablet Take 1,000 mg by mouth every 6 (six) hours as needed for moderate pain.     aluminum hydroxide-magnesium carbonate (GAVISCON) 95-358 MG/15ML SUSP Take 15 mLs by mouth as needed for heartburn or indigestion. (Patient not taking: Reported on 07/22/2023)     amLODipine (NORVASC) 10 MG tablet Take 10 mg by mouth daily. (Patient not taking:  Reported on 07/22/2023)     amoxicillin-clavulanate (AUGMENTIN) 500-125 MG tablet Take 1 tablet by mouth every 8 (eight) hours. (Patient not taking: Reported on 07/22/2023)     Cholecalciferol (VITAMIN D3) 10 MCG (400 UNIT) tablet Take 800 Units by mouth daily. (Patient not taking: Reported on 07/22/2023)     diphenoxylate-atropine (LOMOTIL) 2.5-0.025 MG tablet TAKE 1 TABLET BY MOUTH FOUR TIMES DAILY AS NEEDED FOR DIARRHEA OR LOOSE STOOLS (Patient not taking: Reported on 07/22/2023) 30 tablet 0   doxycycline (VIBRA-TABS) 100 MG tablet Take 1 tablet (100 mg total) by mouth 2 (two) times daily. (Patient not taking: Reported on 07/22/2023) 28 tablet 0   everolimus (AFINITOR) 5 MG tablet Take 1 tablet (5 mg total) by mouth daily. 30 tablet 2   lenvatinib 14 mg daily dose (LENVIMA) 10 & 4 MG capsule Take 14 mg by mouth daily. (Take one 10mg  capsule and one 4mg  capsule for a total dose of 14mg ) 60 capsule 2   levothyroxine (SYNTHROID) 50 MCG tablet TAKE 1 TABLET(50 MCG) BY MOUTH DAILY BEFORE BREAKFAST 30 tablet 1   magic mouthwash SOLN Take 5 mLs by mouth 3 (three) times daily as needed for mouth pain. Suspension contains equal amounts of Maalox Extra Strength, nystatin, and diphenhydramine. (Patient not taking: Reported on 07/22/2023) 240 mL 0   magnesium chloride (SLOW-MAG) 64 MG TBEC SR tablet Take 1 tablet (64 mg total) by mouth 2 (two) times daily. (Patient not taking: Reported on 07/22/2023) 60 tablet 0   methylPREDNISolone (MEDROL DOSEPAK) 4 MG TBPK tablet Use as instructed (Patient not taking: Reported on 07/22/2023) 21 tablet 0   Multiple Vitamin (MULTIVITAMIN WITH MINERALS) TABS tablet Take 1 tablet by mouth 4 (four) times a week.     ondansetron (ZOFRAN) 8 MG tablet TAKE 1 TABLET(8 MG) BY MOUTH EVERY 8 HOURS AS NEEDED FOR NAUSEA OR VOMITING 30 tablet 2   prochlorperazine (COMPAZINE) 10 MG tablet Take 1 tablet (10 mg total) by mouth every 6 (six) hours as needed. (Patient not taking: Reported on 07/22/2023)  30 tablet 2   rosuvastatin (CRESTOR) 20 MG tablet Take 0.5 tablets (10 mg total) by mouth daily. (Patient not taking: Reported on 07/22/2023) 90 tablet 3   telmisartan (MICARDIS) 80 MG tablet TAKE 1/2 TABLET BY MOUTH TWICE DAILY 180 tablet 1   traMADol (ULTRAM) 50 MG tablet Take 1 tablet (50 mg total) by mouth every 12 (twelve) hours as needed. (Patient not taking: Reported on 07/22/2023) 15 tablet 0   TURMERIC PO Take 1 tablet by mouth 3 (three) times a week. (Patient not taking: Reported on 07/22/2023)     Vitamin D, Ergocalciferol, (DRISDOL) 1.25 MG (50000 UNIT) CAPS capsule Take 1 capsule (50,000 Units total) by mouth every 7 (seven) days. (Patient not taking: Reported on 07/22/2023) 4 capsule 4   No current facility-administered medications for this visit.    SURGICAL HISTORY:  Past Surgical History:  Procedure Laterality Date   CHOLECYSTECTOMY     HIP SURGERY     JOINT REPLACEMENT  NEPHRECTOMY Right 10/15/2021   Procedure: NEPHRECTOMY, OPEN;  Surgeon: Jannifer Hick, MD;  Location: [facility];  Service: Urology;  Laterality: Right;   nose skin cancer removal     TOTAL HIP ARTHROPLASTY      REVIEW OF SYSTEMS:  Constitutional: positive for anorexia and fatigue Eyes: negative Ears, nose, mouth, throat, and face: negative Respiratory: positive for cough Cardiovascular: negative Gastrointestinal: negative Genitourinary:negative Integument/breast: negative Hematologic/lymphatic: negative Musculoskeletal:negative Neurological: negative Behavioral/Psych: negative Endocrine: negative Allergic/Immunologic: negative   PHYSICAL EXAMINATION: General appearance: alert, cooperative, fatigued, and no distress Head: Normocephalic, without obvious abnormality, atraumatic Neck: no adenopathy, no JVD, supple, symmetrical, trachea midline, and thyroid not enlarged, symmetric, no tenderness/mass/nodules Lymph nodes: Cervical, supraclavicular, and axillary nodes normal. Resp: clear to auscultation  bilaterally Back: symmetric, no curvature. ROM normal. No CVA tenderness. Cardio: regular rate and rhythm, S1, S2 normal, no murmur, click, rub or gallop GI: soft, non-tender; bowel sounds normal; no masses,  no organomegaly Extremities: extremities normal, atraumatic, no cyanosis or edema Neurologic: Alert and oriented X 3, normal strength and tone. Normal symmetric reflexes. Normal coordination and gait  ECOG PERFORMANCE STATUS: 1 - Symptomatic but completely ambulatory  Blood pressure 130/65, pulse 71, temperature 97.8 F (36.6 C), temperature source Temporal, resp. rate 16, height 5\' 10"  (1.778 m), weight 121 lb 3.2 oz (55 kg), SpO2 99%.  LABORATORY DATA: Lab Results  Component Value Date   WBC 8.4 08/05/2023   HGB 9.6 (L) 08/05/2023   HCT 30.4 (L) 08/05/2023   MCV 93.0 08/05/2023   PLT 297 08/05/2023      Chemistry      Component Value Date/Time   NA 132 (L) 07/07/2023 1459   NA 140 04/22/2022 0000   K 4.0 07/07/2023 1459   CL 92 (L) 07/07/2023 1459   CO2 32 07/07/2023 1459   BUN 22 07/07/2023 1459   BUN 21 04/22/2022 0000   CREATININE 1.11 07/07/2023 1459   CREATININE 1.09 02/06/2020 1306   GLU 88 04/22/2022 0000      Component Value Date/Time   CALCIUM 8.5 (L) 07/07/2023 1459   ALKPHOS 87 07/07/2023 1459   AST 19 07/07/2023 1459   ALT 16 07/07/2023 1459   BILITOT 0.7 07/07/2023 1459       RADIOGRAPHIC STUDIES: No results found.   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  He is status post right open radical nephrectomy in June 2023 followed by 9 cycles of treatment with immunotherapy with Keytruda last dose was given May 21, 2022 before it was discontinued secondary to disease progression.  The patient then started systemic  chemotherapy with nivolumab and Cabometyx initially at 40 mg p.o. daily but this was reduced to 20 mg p.o. daily before it was discontinued secondary to intolerance.  His treatment with nivolumab was discontinued after 7 cycles secondary to disease progression.  The patient was seen by Dr. Clarene Duke at [facility] for a second opinion and she recommended for the patient treatment with lenvatinib and everolimus starting at a reduced dose. He is currently on treatment with lenvatinib 14 mg p.o. daily in addition to everolimus 5 mg p.o. daily started February 02, 2023.  He has been tolerating this treatment fairly well except for occasional diarrhea.  His treatment has been  on hold since July 07, 2023 secondary to suspicious pulmonary infection/inflammatory process.    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 Currently on lenvatinib and everolimus since October 2024. Treatment paused due to pulmonary inflammation; doxycycline prescribed with symptom improvement. Hemoglobin decreased from 12.0 to 9.6 g/dL, with no bleeding. Awaiting scan results to determine resumption of treatment. Iron levels previously low. - Await scan results to determine if lenvatinib 14 mg and everolimus 5 mg can be resumed. - If bronchoscopy is planned, continue to hold treatment until completion. - Resume iron supplementation every other day with vitamin C or orange juice.  Pulmonary inflammation Inflammation observed on a scan led to pulmonologist consultation. Doxycycline prescribed for two weeks, resulting in symptom improvement. Awaiting follow-up scan results to assess antibiotic effectiveness. - Await scan results to assess doxycycline treatment effectiveness.  Anemia Hemoglobin dropped from 12.0 to 9.6 g/dL with no signs of bleeding. Iron levels were previously low; resuming supplementation is expected to improve  hemoglobin and alleviate weakness. - Resume iron supplementation every other day with vitamin C or orange juice.   The patient was advised to call immediately if he has any concerning symptoms in the interval. The patient voices understanding of current disease status and treatment options and is in agreement with the current care plan.  All questions were answered. The patient knows to call the clinic with any problems, questions or concerns. We can certainly see the patient much sooner if necessary.  The total time spent in the appointment was 35 minutes.  Disclaimer: This note was dictated with voice recognition software. Similar sounding words can inadvertently be transcribed and may not be corrected upon review.

## 2023-08-09 ENCOUNTER — Ambulatory Visit
Admission: RE | Admit: 2023-08-09 | Discharge: 2023-08-09 | Disposition: A | Discharge status: Home / Self Care | Payer: PRIVATE HEALTH INSURANCE | Source: Ambulatory Visit | Attending: Emergency Medicine | Admitting: Emergency Medicine

## 2023-08-09 DIAGNOSIS — R9389 Abnormal findings on diagnostic imaging of other specified body structures: Secondary | ICD-10-CM

## 2023-08-09 DIAGNOSIS — K862 Cyst of pancreas: Secondary | ICD-10-CM | POA: Diagnosis not present

## 2023-08-09 DIAGNOSIS — C7800 Secondary malignant neoplasm of unspecified lung: Secondary | ICD-10-CM | POA: Diagnosis not present

## 2023-08-09 DIAGNOSIS — C787 Secondary malignant neoplasm of liver and intrahepatic bile duct: Secondary | ICD-10-CM | POA: Diagnosis not present

## 2023-08-09 DIAGNOSIS — Z85528 Personal history of other malignant neoplasm of kidney: Secondary | ICD-10-CM | POA: Diagnosis not present

## 2023-08-12 ENCOUNTER — Encounter: Payer: Self-pay | Admitting: Emergency Medicine

## 2023-08-12 ENCOUNTER — Ambulatory Visit (INDEPENDENT_AMBULATORY_CARE_PROVIDER_SITE_OTHER): Payer: PRIVATE HEALTH INSURANCE | Admitting: Emergency Medicine

## 2023-08-12 VITALS — BP 122/52 | HR 83 | Ht 70.0 in | Wt 114.2 lb

## 2023-08-12 DIAGNOSIS — R9389 Abnormal findings on diagnostic imaging of other specified body structures: Secondary | ICD-10-CM | POA: Diagnosis not present

## 2023-08-12 NOTE — Progress Notes (Signed)
 Subjective:    Patient ID: Micheal Mcintyre, male    DOB: 10/29/1940, 83 y.o.   MRN: [id number]  HPI  ROV 08/12/2023 --83 year old gentleman with metastatic renal cell carcinoma followed by Dr. Arbutus Ped.  I saw him in late March after a CT scan of his chest 06/30/2023 revealed new right upper lobe, left upper lobe partially cavitary opacities concerning for either infectious process or malignancy.  He was treated with with doxycycline for 2 weeks.  We repeated his CT scan of the chest to look for interval improvement after antibiotic treatment. He reports today that he feels a bit stronger. Minimal cough, no sputum.   RADIOLOGY CT scan of the chest done 08/09/2023 reviewed by me shows enlarged more consolidated opacities in the right upper lobe, right lower lobe, still with areas of cavitation.  There is an evolving less prominent left lower lobe infiltrate with partial cavitation as well that was not present in February.  Also some scattered smaller subcentimeter pulmonary nodules right lower lobe predominant.   Review of Systems As per HPI  Past Medical History:  Diagnosis Date   Arthritis    GERD (gastroesophageal reflux disease)    Hypertension    Medicare annual wellness visit, subsequent 08/19/2014   Osteoarthritis 09/21/2007   Qualifier: Diagnosis of  By: Alphonzo Severance MD, Ivar Drape R    PONV (postoperative nausea and vomiting)    Preventative health care 06/07/2016   Right shoulder pain 12/03/2016     Family History  Problem Relation Age of Onset   Heart attack Father 45       deceased   Diabetes Maternal Uncle        maternal grandfather   Hypertension Neg Hx    Breast cancer Neg Hx    Colon cancer Neg Hx    Prostate cancer Neg Hx      Social History   Socioeconomic History   Marital status: Married    Spouse name: Not on file   Number of children: Not on file   Years of education: Not on file   Highest education level: Not on file  Occupational History   Not on file   Tobacco Use   Smoking status: Never   Smokeless tobacco: Never  Vaping Use   Vaping status: Never Used  Substance and Sexual Activity   Alcohol use: Yes    Comment: occ   Drug use: No   Sexual activity: Not Currently  Other Topics Concern   Not on file  Social History Narrative   Not on file   Social Drivers of Health   Financial Resource Strain: Low Risk  (01/08/2021)   Overall Financial Resource Strain (CARDIA)    Difficulty of Paying Living Expenses: Not hard at all  Food Insecurity: No Food Insecurity (01/08/2021)   Hunger Vital Sign    Worried About Running Out of Food in the Last Year: Never true    Ran Out of Food in the Last Year: Never true  Transportation Needs: No Transportation Needs (01/08/2021)   PRAPARE - Administrator, Civil Service (Medical): No    Lack of Transportation (Non-Medical): No  Physical Activity: Sufficiently Active (01/08/2021)   Exercise Vital Sign    Days of Exercise per Week: 5 days    Minutes of Exercise per Session: 30 min  Stress: No Stress Concern Present (01/08/2021)   [employer] of Occupational Health - Occupational Stress Questionnaire    Feeling of Stress : Not at all  Social Connections: Moderately Integrated (01/08/2021)   Social Connection and Isolation Panel [NHANES]    Frequency of Communication with Friends and Family: More than three times a week    Frequency of Social Gatherings with Friends and Family: More than three times a week    Attends Religious Services: More than 4 times per year    Active Member of [employer] or Organizations: No    Attends Banker Meetings: Never    Marital Status: Married  Catering manager Violence: Not At Risk (01/08/2021)   Humiliation, Afraid, Rape, and Kick questionnaire    Fear of Current or Ex-Partner: No    Emotionally Abused: No    Physically Abused: No    Sexually Abused: No    - Never smoker  Allergies  Allergen Reactions   Statins Other (See Comments)     myalgia     Outpatient Medications Prior to Visit  Medication Sig Dispense Refill   acetaminophen (TYLENOL) 500 MG tablet Take 1,000 mg by mouth every 6 (six) hours as needed for moderate pain.     everolimus (AFINITOR) 5 MG tablet Take 1 tablet (5 mg total) by mouth daily. 30 tablet 2   lenvatinib 14 mg daily dose (LENVIMA) 10 & 4 MG capsule Take 14 mg by mouth daily. (Take one 10mg  capsule and one 4mg  capsule for a total dose of 14mg ) 60 capsule 2   levothyroxine (SYNTHROID) 50 MCG tablet TAKE 1 TABLET(50 MCG) BY MOUTH DAILY BEFORE BREAKFAST 30 tablet 1   Multiple Vitamin (MULTIVITAMIN WITH MINERALS) TABS tablet Take 1 tablet by mouth 4 (four) times a week.     telmisartan (MICARDIS) 80 MG tablet TAKE 1/2 TABLET BY MOUTH TWICE DAILY 180 tablet 1   aluminum hydroxide-magnesium carbonate (GAVISCON) 95-358 MG/15ML SUSP Take 15 mLs by mouth as needed for heartburn or indigestion. (Patient not taking: Reported on 07/22/2023)     amLODipine (NORVASC) 10 MG tablet Take 10 mg by mouth daily. (Patient not taking: Reported on 08/12/2023)     amoxicillin-clavulanate (AUGMENTIN) 500-125 MG tablet Take 1 tablet by mouth every 8 (eight) hours. (Patient not taking: Reported on 08/12/2023)     Cholecalciferol (VITAMIN D3) 10 MCG (400 UNIT) tablet Take 800 Units by mouth daily. (Patient not taking: Reported on 07/22/2023)     diphenoxylate-atropine (LOMOTIL) 2.5-0.025 MG tablet TAKE 1 TABLET BY MOUTH FOUR TIMES DAILY AS NEEDED FOR DIARRHEA OR LOOSE STOOLS (Patient not taking: Reported on 08/12/2023) 30 tablet 0   doxycycline (VIBRA-TABS) 100 MG tablet Take 1 tablet (100 mg total) by mouth 2 (two) times daily. (Patient not taking: Reported on 08/12/2023) 28 tablet 0   magic mouthwash SOLN Take 5 mLs by mouth 3 (three) times daily as needed for mouth pain. Suspension contains equal amounts of Maalox Extra Strength, nystatin, and diphenhydramine. (Patient not taking: Reported on 08/12/2023) 240 mL 0   magnesium chloride  (SLOW-MAG) 64 MG TBEC SR tablet Take 1 tablet (64 mg total) by mouth 2 (two) times daily. (Patient not taking: Reported on 08/12/2023) 60 tablet 0   methylPREDNISolone (MEDROL DOSEPAK) 4 MG TBPK tablet Use as instructed (Patient not taking: Reported on 08/12/2023) 21 tablet 0   ondansetron (ZOFRAN) 8 MG tablet TAKE 1 TABLET(8 MG) BY MOUTH EVERY 8 HOURS AS NEEDED FOR NAUSEA OR VOMITING (Patient not taking: Reported on 08/12/2023) 30 tablet 2   prochlorperazine (COMPAZINE) 10 MG tablet Take 1 tablet (10 mg total) by mouth every 6 (six) hours as needed. (Patient not taking:  Reported on 08/12/2023) 30 tablet 2   rosuvastatin (CRESTOR) 20 MG tablet Take 0.5 tablets (10 mg total) by mouth daily. (Patient not taking: Reported on 08/12/2023) 90 tablet 3   traMADol (ULTRAM) 50 MG tablet Take 1 tablet (50 mg total) by mouth every 12 (twelve) hours as needed. (Patient not taking: Reported on 08/12/2023) 15 tablet 0   TURMERIC PO Take 1 tablet by mouth 3 (three) times a week. (Patient not taking: Reported on 07/22/2023)     Vitamin D, Ergocalciferol, (DRISDOL) 1.25 MG (50000 UNIT) CAPS capsule Take 1 capsule (50,000 Units total) by mouth every 7 (seven) days. (Patient not taking: Reported on 08/12/2023) 4 capsule 4   No facility-administered medications prior to visit.         Objective:   Physical Exam Vitals:   08/12/23 1326  BP: (!) 122/52  Pulse: 83  SpO2: 99%  Weight: 114 lb 3.2 oz (51.8 kg)  Height: 5\' 10"  (1.778 m)     Gen: Pleasant, thin ill appearing man, in no distress,  normal affect  ENT: No lesions,  mouth clear,  oropharynx clear, no postnasal drip  Neck: No JVD, no stridor  Lungs: No use of accessory muscles, distant and coarse  Cardiovascular: RRR, heart sounds normal, no murmur or gallops, no peripheral edema  Musculoskeletal: No deformities, no cyanosis or clubbing  Neuro: alert, awake, non focal  Skin: Warm, no lesions or rash      Assessment & Plan:  Abnormal CT of the  chest Bilateral upper lobe opacities with some cavitation, now developing a left lower lobe opacity as well.  Concerning for opportunistic infection although malignancy a possibility.  We discussed the options today and I have recommended navigational bronchoscopy for biopsies and culture data.  Will try to get this done ASAP, hopefully on 08/17/2023   We reviewed your CT scan of the chest today.  There are persistent mass like opacities, still present after antibiotic treatment. We will arrange for navigational bronchoscopy to get cultures and biopsies of your lung abnormalities.  This will be done under general anesthesia as an outpatient at [facility] endoscopy.  You will need a designated driver and someone to watch you that day at home after the procedure.  We will try to get this arranged for 08/17/2023.  Time spent 32 minutes  Levy Pupa, MD, PhD 08/12/2023, 1:40 PM [facility] [phone] or if no answer before 7:00PM call [phone] For any issues after 7:00PM please call eLink [phone]

## 2023-08-12 NOTE — Assessment & Plan Note (Signed)
 Bilateral upper lobe opacities with some cavitation, now developing a left lower lobe opacity as well.  Concerning for opportunistic infection although malignancy a possibility.  We discussed the options today and I have recommended navigational bronchoscopy for biopsies and culture data.  Will try to get this done ASAP, hopefully on 08/17/2023   We reviewed your CT scan of the chest today.  There are persistent mass like opacities, still present after antibiotic treatment. We will arrange for navigational bronchoscopy to get cultures and biopsies of your lung abnormalities.  This will be done under general anesthesia as an outpatient at [facility] endoscopy.  You will need a designated driver and someone to watch you that day at home after the procedure.  We will try to get this arranged for 08/17/2023.

## 2023-08-12 NOTE — H&P (View-Only) (Signed)
 Subjective:    Patient ID: Micheal Mcintyre, male    DOB: 10/29/1940, 83 y.o.   MRN: [id number]  HPI  ROV 08/12/2023 --83 year old gentleman with metastatic renal cell carcinoma followed by Dr. Arbutus Ped.  I saw him in late March after a CT scan of his chest 06/30/2023 revealed new right upper lobe, left upper lobe partially cavitary opacities concerning for either infectious process or malignancy.  He was treated with with doxycycline for 2 weeks.  We repeated his CT scan of the chest to look for interval improvement after antibiotic treatment. He reports today that he feels a bit stronger. Minimal cough, no sputum.   RADIOLOGY CT scan of the chest done 08/09/2023 reviewed by me shows enlarged more consolidated opacities in the right upper lobe, right lower lobe, still with areas of cavitation.  There is an evolving less prominent left lower lobe infiltrate with partial cavitation as well that was not present in February.  Also some scattered smaller subcentimeter pulmonary nodules right lower lobe predominant.   Review of Systems As per HPI  Past Medical History:  Diagnosis Date   Arthritis    GERD (gastroesophageal reflux disease)    Hypertension    Medicare annual wellness visit, subsequent 08/19/2014   Osteoarthritis 09/21/2007   Qualifier: Diagnosis of  By: Alphonzo Severance MD, Ivar Drape R    PONV (postoperative nausea and vomiting)    Preventative health care 06/07/2016   Right shoulder pain 12/03/2016     Family History  Problem Relation Age of Onset   Heart attack Father 45       deceased   Diabetes Maternal Uncle        maternal grandfather   Hypertension Neg Hx    Breast cancer Neg Hx    Colon cancer Neg Hx    Prostate cancer Neg Hx      Social History   Socioeconomic History   Marital status: Married    Spouse name: Not on file   Number of children: Not on file   Years of education: Not on file   Highest education level: Not on file  Occupational History   Not on file   Tobacco Use   Smoking status: Never   Smokeless tobacco: Never  Vaping Use   Vaping status: Never Used  Substance and Sexual Activity   Alcohol use: Yes    Comment: occ   Drug use: No   Sexual activity: Not Currently  Other Topics Concern   Not on file  Social History Narrative   Not on file   Social Drivers of Health   Financial Resource Strain: Low Risk  (01/08/2021)   Overall Financial Resource Strain (CARDIA)    Difficulty of Paying Living Expenses: Not hard at all  Food Insecurity: No Food Insecurity (01/08/2021)   Hunger Vital Sign    Worried About Running Out of Food in the Last Year: Never true    Ran Out of Food in the Last Year: Never true  Transportation Needs: No Transportation Needs (01/08/2021)   PRAPARE - Administrator, Civil Service (Medical): No    Lack of Transportation (Non-Medical): No  Physical Activity: Sufficiently Active (01/08/2021)   Exercise Vital Sign    Days of Exercise per Week: 5 days    Minutes of Exercise per Session: 30 min  Stress: No Stress Concern Present (01/08/2021)   [employer] of Occupational Health - Occupational Stress Questionnaire    Feeling of Stress : Not at all  Social Connections: Moderately Integrated (01/08/2021)   Social Connection and Isolation Panel [NHANES]    Frequency of Communication with Friends and Family: More than three times a week    Frequency of Social Gatherings with Friends and Family: More than three times a week    Attends Religious Services: More than 4 times per year    Active Member of [employer] or Organizations: No    Attends Banker Meetings: Never    Marital Status: Married  Catering manager Violence: Not At Risk (01/08/2021)   Humiliation, Afraid, Rape, and Kick questionnaire    Fear of Current or Ex-Partner: No    Emotionally Abused: No    Physically Abused: No    Sexually Abused: No    - Never smoker  Allergies  Allergen Reactions   Statins Other (See Comments)     myalgia     Outpatient Medications Prior to Visit  Medication Sig Dispense Refill   acetaminophen (TYLENOL) 500 MG tablet Take 1,000 mg by mouth every 6 (six) hours as needed for moderate pain.     everolimus (AFINITOR) 5 MG tablet Take 1 tablet (5 mg total) by mouth daily. 30 tablet 2   lenvatinib 14 mg daily dose (LENVIMA) 10 & 4 MG capsule Take 14 mg by mouth daily. (Take one 10mg  capsule and one 4mg  capsule for a total dose of 14mg ) 60 capsule 2   levothyroxine (SYNTHROID) 50 MCG tablet TAKE 1 TABLET(50 MCG) BY MOUTH DAILY BEFORE BREAKFAST 30 tablet 1   Multiple Vitamin (MULTIVITAMIN WITH MINERALS) TABS tablet Take 1 tablet by mouth 4 (four) times a week.     telmisartan (MICARDIS) 80 MG tablet TAKE 1/2 TABLET BY MOUTH TWICE DAILY 180 tablet 1   aluminum hydroxide-magnesium carbonate (GAVISCON) 95-358 MG/15ML SUSP Take 15 mLs by mouth as needed for heartburn or indigestion. (Patient not taking: Reported on 07/22/2023)     amLODipine (NORVASC) 10 MG tablet Take 10 mg by mouth daily. (Patient not taking: Reported on 08/12/2023)     amoxicillin-clavulanate (AUGMENTIN) 500-125 MG tablet Take 1 tablet by mouth every 8 (eight) hours. (Patient not taking: Reported on 08/12/2023)     Cholecalciferol (VITAMIN D3) 10 MCG (400 UNIT) tablet Take 800 Units by mouth daily. (Patient not taking: Reported on 07/22/2023)     diphenoxylate-atropine (LOMOTIL) 2.5-0.025 MG tablet TAKE 1 TABLET BY MOUTH FOUR TIMES DAILY AS NEEDED FOR DIARRHEA OR LOOSE STOOLS (Patient not taking: Reported on 08/12/2023) 30 tablet 0   doxycycline (VIBRA-TABS) 100 MG tablet Take 1 tablet (100 mg total) by mouth 2 (two) times daily. (Patient not taking: Reported on 08/12/2023) 28 tablet 0   magic mouthwash SOLN Take 5 mLs by mouth 3 (three) times daily as needed for mouth pain. Suspension contains equal amounts of Maalox Extra Strength, nystatin, and diphenhydramine. (Patient not taking: Reported on 08/12/2023) 240 mL 0   magnesium chloride  (SLOW-MAG) 64 MG TBEC SR tablet Take 1 tablet (64 mg total) by mouth 2 (two) times daily. (Patient not taking: Reported on 08/12/2023) 60 tablet 0   methylPREDNISolone (MEDROL DOSEPAK) 4 MG TBPK tablet Use as instructed (Patient not taking: Reported on 08/12/2023) 21 tablet 0   ondansetron (ZOFRAN) 8 MG tablet TAKE 1 TABLET(8 MG) BY MOUTH EVERY 8 HOURS AS NEEDED FOR NAUSEA OR VOMITING (Patient not taking: Reported on 08/12/2023) 30 tablet 2   prochlorperazine (COMPAZINE) 10 MG tablet Take 1 tablet (10 mg total) by mouth every 6 (six) hours as needed. (Patient not taking:  Reported on 08/12/2023) 30 tablet 2   rosuvastatin (CRESTOR) 20 MG tablet Take 0.5 tablets (10 mg total) by mouth daily. (Patient not taking: Reported on 08/12/2023) 90 tablet 3   traMADol (ULTRAM) 50 MG tablet Take 1 tablet (50 mg total) by mouth every 12 (twelve) hours as needed. (Patient not taking: Reported on 08/12/2023) 15 tablet 0   TURMERIC PO Take 1 tablet by mouth 3 (three) times a week. (Patient not taking: Reported on 07/22/2023)     Vitamin D, Ergocalciferol, (DRISDOL) 1.25 MG (50000 UNIT) CAPS capsule Take 1 capsule (50,000 Units total) by mouth every 7 (seven) days. (Patient not taking: Reported on 08/12/2023) 4 capsule 4   No facility-administered medications prior to visit.         Objective:   Physical Exam Vitals:   08/12/23 1326  BP: (!) 122/52  Pulse: 83  SpO2: 99%  Weight: 114 lb 3.2 oz (51.8 kg)  Height: 5\' 10"  (1.778 m)     Gen: Pleasant, thin ill appearing man, in no distress,  normal affect  ENT: No lesions,  mouth clear,  oropharynx clear, no postnasal drip  Neck: No JVD, no stridor  Lungs: No use of accessory muscles, distant and coarse  Cardiovascular: RRR, heart sounds normal, no murmur or gallops, no peripheral edema  Musculoskeletal: No deformities, no cyanosis or clubbing  Neuro: alert, awake, non focal  Skin: Warm, no lesions or rash      Assessment & Plan:  Abnormal CT of the  chest Bilateral upper lobe opacities with some cavitation, now developing a left lower lobe opacity as well.  Concerning for opportunistic infection although malignancy a possibility.  We discussed the options today and I have recommended navigational bronchoscopy for biopsies and culture data.  Will try to get this done ASAP, hopefully on 08/17/2023   We reviewed your CT scan of the chest today.  There are persistent mass like opacities, still present after antibiotic treatment. We will arrange for navigational bronchoscopy to get cultures and biopsies of your lung abnormalities.  This will be done under general anesthesia as an outpatient at [facility] endoscopy.  You will need a designated driver and someone to watch you that day at home after the procedure.  We will try to get this arranged for 08/17/2023.  Time spent 32 minutes  Levy Pupa, MD, PhD 08/12/2023, 1:40 PM [facility] [phone] or if no answer before 7:00PM call [phone] For any issues after 7:00PM please call eLink [phone]

## 2023-08-12 NOTE — Patient Instructions (Signed)
 We reviewed your CT scan of the chest today.  There are persistent mass like opacities, still present after antibiotic treatment. We will arrange for navigational bronchoscopy to get cultures and biopsies of your lung abnormalities.  This will be done under general anesthesia as an outpatient at [facility] endoscopy.  You will need a designated driver and someone to watch you that day at home after the procedure.  We will try to get this arranged for 08/17/2023.

## 2023-08-16 ENCOUNTER — Encounter (HOSPITAL_COMMUNITY): Payer: Self-pay | Admitting: Emergency Medicine

## 2023-08-16 ENCOUNTER — Telehealth: Payer: Self-pay | Admitting: Medical Oncology

## 2023-08-16 ENCOUNTER — Other Ambulatory Visit: Payer: Self-pay | Admitting: Internal Medicine

## 2023-08-16 ENCOUNTER — Other Ambulatory Visit: Payer: Self-pay

## 2023-08-16 NOTE — Progress Notes (Signed)
 PCP - Dr Randie Bustle Cardiologist - none Oncology - Dr Marlene Simas  CT Chest x-ray - 08/09/23 EKG - 09/26/22 Stress Test - n/a ECHO - 03/06/20 Cardiac Cath - n/a  ICD Pacemaker/Loop - n/a  Sleep Study -  n/a  Diabetes - n/a  Aspirin & Blood Thinner Instructions:  n/a  NPO   Anesthesia review: no  STOP now taking any Aspirin (unless otherwise instructed by your surgeon), Aleve, Naproxen, Ibuprofen, Motrin, Advil, Goody's, BC's, all herbal medications, fish oil, and all vitamins.   Coronavirus Screening Do you have any of the following symptoms:  Cough -occasional Fever (>100.57F)  yes/no: No Runny nose yes/no: No Sore throat yes/no: No Difficulty breathing/shortness of breath  yes/no: No  Have you traveled in the last 14 days and where? yes/no: No  Patient verbalized understanding of instructions that were given via phone.

## 2023-08-16 NOTE — Telephone Encounter (Signed)
 Faxed recent labs to Dr Garland Junk.

## 2023-08-17 ENCOUNTER — Ambulatory Visit (HOSPITAL_BASED_OUTPATIENT_CLINIC_OR_DEPARTMENT_OTHER): Admitting: Anesthesiology

## 2023-08-17 ENCOUNTER — Encounter (HOSPITAL_COMMUNITY)
Admission: RE | Disposition: A | Discharge status: Home / Self Care | Payer: Self-pay | Source: Home / Self Care | Attending: Emergency Medicine

## 2023-08-17 ENCOUNTER — Ambulatory Visit (HOSPITAL_COMMUNITY)
Admission: RE | Admit: 2023-08-17 | Discharge: 2023-08-17 | Disposition: A | Discharge status: Home / Self Care | Attending: Emergency Medicine | Admitting: Emergency Medicine

## 2023-08-17 ENCOUNTER — Other Ambulatory Visit: Payer: Self-pay

## 2023-08-17 ENCOUNTER — Encounter (HOSPITAL_COMMUNITY): Payer: Self-pay | Admitting: Emergency Medicine

## 2023-08-17 ENCOUNTER — Ambulatory Visit (HOSPITAL_COMMUNITY)

## 2023-08-17 ENCOUNTER — Ambulatory Visit (HOSPITAL_COMMUNITY): Admitting: Anesthesiology

## 2023-08-17 DIAGNOSIS — R918 Other nonspecific abnormal finding of lung field: Secondary | ICD-10-CM | POA: Diagnosis not present

## 2023-08-17 DIAGNOSIS — B441 Other pulmonary aspergillosis: Secondary | ICD-10-CM | POA: Insufficient documentation

## 2023-08-17 DIAGNOSIS — I10 Essential (primary) hypertension: Secondary | ICD-10-CM | POA: Insufficient documentation

## 2023-08-17 DIAGNOSIS — E039 Hypothyroidism, unspecified: Secondary | ICD-10-CM

## 2023-08-17 DIAGNOSIS — B49 Unspecified mycosis: Secondary | ICD-10-CM | POA: Diagnosis not present

## 2023-08-17 DIAGNOSIS — K219 Gastro-esophageal reflux disease without esophagitis: Secondary | ICD-10-CM | POA: Diagnosis not present

## 2023-08-17 DIAGNOSIS — R9389 Abnormal findings on diagnostic imaging of other specified body structures: Secondary | ICD-10-CM | POA: Diagnosis present

## 2023-08-17 DIAGNOSIS — Z48813 Encounter for surgical aftercare following surgery on the respiratory system: Secondary | ICD-10-CM | POA: Diagnosis not present

## 2023-08-17 DIAGNOSIS — Z79899 Other long term (current) drug therapy: Secondary | ICD-10-CM | POA: Diagnosis not present

## 2023-08-17 DIAGNOSIS — R911 Solitary pulmonary nodule: Secondary | ICD-10-CM | POA: Diagnosis not present

## 2023-08-17 DIAGNOSIS — Z79890 Hormone replacement therapy (postmenopausal): Secondary | ICD-10-CM | POA: Insufficient documentation

## 2023-08-17 DIAGNOSIS — R846 Abnormal cytological findings in specimens from respiratory organs and thorax: Secondary | ICD-10-CM | POA: Diagnosis not present

## 2023-08-17 HISTORY — PX: BRONCHIAL NEEDLE ASPIRATION BIOPSY: SHX5106

## 2023-08-17 HISTORY — DX: Hypothyroidism, unspecified: E03.9

## 2023-08-17 HISTORY — PX: BRONCHIAL BRUSHINGS: SHX5108

## 2023-08-17 HISTORY — PX: BRONCHIAL BIOPSY: SHX5109

## 2023-08-17 SURGERY — BRONCHOSCOPY, WITH BIOPSY USING ELECTROMAGNETIC NAVIGATION
Anesthesia: General | Laterality: Bilateral

## 2023-08-17 MED ORDER — LACTATED RINGERS IV SOLN: INTRAVENOUS | Status: DC

## 2023-08-17 MED ORDER — CHLORHEXIDINE GLUCONATE 0.12 % MT SOLN: 15.0000 mL | Freq: Once | OROMUCOSAL | Status: AC

## 2023-08-17 MED ORDER — ROCURONIUM BROMIDE 10 MG/ML (PF) SYRINGE
PREFILLED_SYRINGE | INTRAVENOUS | Status: DC | PRN
  Administered 2023-08-17: 10 mg via INTRAVENOUS
  Administered 2023-08-17: 40 mg via INTRAVENOUS

## 2023-08-17 MED ORDER — FENTANYL CITRATE (PF) 100 MCG/2ML IJ SOLN: 25.0000 ug | INTRAMUSCULAR | Status: DC | PRN

## 2023-08-17 MED ORDER — EPHEDRINE SULFATE-NACL 50-0.9 MG/10ML-% IV SOSY
PREFILLED_SYRINGE | INTRAVENOUS | Status: DC | PRN
Start: 2023-08-17 — End: 2023-08-17
  Administered 2023-08-17 (×2): 5 mg via INTRAVENOUS

## 2023-08-17 MED ORDER — ALBUMIN HUMAN 5 % IV SOLN
INTRAVENOUS | Status: AC
  Filled 2023-08-17: qty 250

## 2023-08-17 MED ORDER — ACETAMINOPHEN 10 MG/ML IV SOLN
1000.0000 mg | Freq: Once | INTRAVENOUS | Status: AC
  Administered 2023-08-17: 1000 mg via INTRAVENOUS
  Filled 2023-08-17: qty 100

## 2023-08-17 MED ORDER — OXYCODONE HCL 5 MG PO TABS: 5.0000 mg | ORAL_TABLET | Freq: Once | ORAL | Status: DC | PRN

## 2023-08-17 MED ORDER — PHENYLEPHRINE HCL-NACL 20-0.9 MG/250ML-% IV SOLN
INTRAVENOUS | Status: DC | PRN
  Administered 2023-08-17: 45 ug/min via INTRAVENOUS

## 2023-08-17 MED ORDER — DEXAMETHASONE SODIUM PHOSPHATE 10 MG/ML IJ SOLN
INTRAMUSCULAR | Status: DC | PRN
  Administered 2023-08-17: 10 mg via INTRAVENOUS

## 2023-08-17 MED ORDER — SUGAMMADEX SODIUM 200 MG/2ML IV SOLN
INTRAVENOUS | Status: DC | PRN
  Administered 2023-08-17: 200 mg via INTRAVENOUS

## 2023-08-17 MED ORDER — PHENYLEPHRINE 80 MCG/ML (10ML) SYRINGE FOR IV PUSH (FOR BLOOD PRESSURE SUPPORT)
PREFILLED_SYRINGE | INTRAVENOUS | Status: DC | PRN
Start: 2023-08-17 — End: 2023-08-17
  Administered 2023-08-17 (×4): 80 ug via INTRAVENOUS

## 2023-08-17 MED ORDER — OXYCODONE HCL 5 MG/5ML PO SOLN: 5.0000 mg | Freq: Once | ORAL | Status: DC | PRN

## 2023-08-17 MED ORDER — VORICONAZOLE 200 MG PO TABS: 200.0000 mg | ORAL_TABLET | Freq: Two times a day (BID) | ORAL | 3 refills | Status: DC

## 2023-08-17 MED ORDER — AMISULPRIDE (ANTIEMETIC) 5 MG/2ML IV SOLN: 10.0000 mg | Freq: Once | INTRAVENOUS | Status: DC | PRN

## 2023-08-17 MED ORDER — LIDOCAINE 2% (20 MG/ML) 5 ML SYRINGE
INTRAMUSCULAR | Status: DC | PRN
  Administered 2023-08-17: 50 mg via INTRAVENOUS

## 2023-08-17 MED ORDER — PROPOFOL 500 MG/50ML IV EMUL
INTRAVENOUS | Status: DC | PRN
  Administered 2023-08-17: 150 ug/kg/min via INTRAVENOUS

## 2023-08-17 MED ORDER — CHLORHEXIDINE GLUCONATE 0.12 % MT SOLN
OROMUCOSAL | Status: AC
  Administered 2023-08-17: 15 mL via OROMUCOSAL
  Filled 2023-08-17: qty 15

## 2023-08-17 MED ORDER — ACETAMINOPHEN 10 MG/ML IV SOLN
1000.0000 mg | Freq: Four times a day (QID) | INTRAVENOUS | Status: DC
Start: 2023-08-17 — End: 2023-08-17

## 2023-08-17 MED ORDER — PROPOFOL 10 MG/ML IV BOLUS
INTRAVENOUS | Status: DC | PRN
  Administered 2023-08-17: 100 mg via INTRAVENOUS

## 2023-08-17 MED ORDER — ONDANSETRON HCL 4 MG/2ML IJ SOLN
INTRAMUSCULAR | Status: DC | PRN
  Administered 2023-08-17: 4 mg via INTRAVENOUS

## 2023-08-17 NOTE — Discharge Instructions (Signed)
 Flexible Bronchoscopy, Care After This sheet gives you information about how to care for yourself after your test. Your doctor may also give you more specific instructions. If you have problems or questions, contact your doctor. Follow these instructions at home: Eating and drinking When your numbness is gone and your cough and gag reflexes have come back, you may: Eat only soft foods. Slowly drink liquids. When you get home after the test, go back to your normal diet. Driving Do not drive for 24 hours if you were given a medicine to help you relax (sedative). Do not drive or use heavy machinery while taking prescription pain medicine. General instructions  Take over-the-counter and prescription medicines only as told by your doctor. Return to your normal activities as told. Ask what activities are safe for you. Do not use any products that have nicotine or tobacco in them. This includes cigarettes and e-cigarettes. If you need help quitting, ask your doctor. Keep all follow-up visits as told by your doctor. This is important. It is very important if you had a tissue sample (biopsy) taken. Get help right away if: You have shortness of breath that gets worse. You get light-headed. You feel like you are going to pass out (faint). You have chest pain. You cough up: More than a little blood. More blood than before. Summary Do not eat or drink anything (not even water) for 2 hours after your test, or until your numbing medicine wears off. Do not use cigarettes. Do not use e-cigarettes. Get help right away if you have chest pain.  Please call our office for any questions or concerns.  [phone].  Please start voriconazole 200 mg twice a day pending your biopsy results and culture results.  This information is not intended to replace advice given to you by your health care provider. Make sure you discuss any questions you have with your health care provider. Document Released:  02/15/2009 Document Revised: 04/02/2017 Document Reviewed: 05/08/2016 Elsevier Patient Education  2020 [employer].

## 2023-08-17 NOTE — Anesthesia Procedure Notes (Signed)
 Procedure Name: Intubation Date/Time: 08/17/2023 11:29 AM  Performed by: Hershall Lory, CRNAPre-anesthesia Checklist: Patient identified, Emergency Drugs available, Suction available and Patient being monitored Patient Re-evaluated:Patient Re-evaluated prior to induction Oxygen Delivery Method: Circle system utilized Preoxygenation: Pre-oxygenation with 100% oxygen Induction Type: IV induction Ventilation: Mask ventilation without difficulty Grade View: Grade I Tube type: Oral Tube size: 8.5 mm Number of attempts: 1 Airway Equipment and Method: Stylet and Oral airway Placement Confirmation: ETT inserted through vocal cords under direct vision, positive ETCO2 and breath sounds checked- equal and bilateral Tube secured with: Tape Dental Injury: Teeth and Oropharynx as per pre-operative assessment

## 2023-08-17 NOTE — Transfer of Care (Signed)
 Immediate Anesthesia Transfer of Care Note  Patient: Micheal Mcintyre  Procedure(s) Performed: BRONCHOSCOPY, WITH BIOPSY USING ELECTROMAGNETIC NAVIGATION (Bilateral) BRONCHOSCOPY, WITH BRUSH BIOPSY BRONCHOSCOPY, WITH BIOPSY BRONCHOSCOPY, WITH NEEDLE ASPIRATION BIOPSY  Patient Location: PACU  Anesthesia Type:General  Level of Consciousness: awake, alert , and oriented  Airway & Oxygen Therapy: Patient Spontanous Breathing  Post-op Assessment: Report given to RN  Post vital signs: Reviewed and stable  Last Vitals:  Vitals Value Taken Time  BP 98/52 08/17/23 1300  Temp    Pulse 81 08/17/23 1301  Resp 21 08/17/23 1301  SpO2 99 % 08/17/23 1301  Vitals shown include unfiled device data.  Last Pain:  Vitals:   08/17/23 1300  TempSrc:   PainSc: 7       Patients Stated Pain Goal: 0 (08/17/23 1300)  Complications: No notable events documented.

## 2023-08-17 NOTE — Op Note (Signed)
 Video Bronchoscopy with Robotic Assisted Bronchoscopic Navigation   Date of Operation: 08/17/2023   Pre-op Diagnosis: Right upper lobe opacity, left lower lobe superior segment cavitary lesion and opacity  Post-op Diagnosis: Probable fungal pneumonia with cavitation  Surgeon: Racheal Buddle  Assistants: None  Anesthesia: General endotracheal anesthesia  Operation: Flexible video fiberoptic bronchoscopy with robotic assistance and biopsies.  Estimated Blood Loss: Minimal  Complications: None  Indications and History: Micheal Mcintyre is a 83 y.o. male with history of metastatic renal cell carcinoma on therapy (lenvatinib, everolimus).  Surveillance imaging showed bilateral cavitary opacities concerning for either metastatic disease or opportunistic infection.  Recommendation made to achieve a tissue diagnosis and to obtain culture data via robotic assisted navigational bronchoscopy. The risks, benefits, complications, treatment options and expected outcomes were discussed with the patient.  The possibilities of pneumothorax, pneumonia, reaction to medication, pulmonary aspiration, perforation of a viscus, bleeding, failure to diagnose a condition and creating a complication requiring transfusion or operation were discussed with the patient who freely signed the consent.    Description of Procedure: The patient was seen in the Preoperative Area, was examined and was deemed appropriate to proceed.  The patient was taken to [facility] endoscopy room 3, identified as Micheal Mcintyre and the procedure verified as Flexible Video Fiberoptic Bronchoscopy.  A Time Out was held and the above information confirmed.   Prior to the date of the procedure a high-resolution CT scan of the chest was performed. Utilizing ION software program a virtual tracheobronchial tree was generated to allow the creation of distinct navigation pathways to the patient's parenchymal abnormalities. After being taken to the operating room  general anesthesia was initiated and the patient  was orally intubated. The video fiberoptic bronchoscope was introduced via the endotracheal tube and a general inspection was performed which showed normal right and left lung anatomy. Aspiration of the bilateral mainstems was completed to remove any remaining secretions. Robotic catheter inserted into patient's endotracheal tube.   Target #1 right upper lobe opacity: The distinct navigation pathways prepared prior to this procedure were then utilized to navigate to patient's lesion identified on CT scan. The robotic catheter was secured into place and the vision probe was withdrawn.  Lesion location was approximated using fluoroscopy.  Needle in lesion was confirmed using Cios three-dimensional imaging. Under fluoroscopic guidance transbronchial needle brushings and transbronchial needle biopsies were performed to be sent for cytology and pathology.  A single transbronchial needle biopsy was placed into sterile saline to be sent for routine, fungal, AFB cultures.  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   Target #2 left lower lobe superior segment cavitary lesion: The distinct navigation pathways prepared prior to this procedure were then utilized to navigate to patient's lesion identified on CT scan. The robotic catheter was secured into place and the vision probe was withdrawn.  Lesion location was approximated using fluoroscopy.  Needle in lesion was established using Cios three-dimensional imaging. Under fluoroscopic guidance transbronchial needle brushings, transbronchial needle biopsies, and transbronchial forceps biopsies were performed to be sent for cytology and pathology.  A single needle sample was placed into sterile saline and sent for culture.  Quik stain analysis did not identify any abnormality at the samples were too bloody.  At the end of the procedure a general  airway inspection was performed and there was no evidence of active bleeding. The bronchoscope was removed.  The patient tolerated the procedure well. There was no significant  blood loss and there were no obvious complications. A post-procedural chest x-ray is pending.  Samples Target #1: 1. Transbronchial needle brushings from right upper lobe opacity 2. Transbronchial Wang needle biopsies from right upper lobe opacity  Samples Target #2: 1. Transbronchial needle brushings from left lower lobe cavitary lesion 2. Transbronchial Wang needle biopsies from left lower lobe cavitary lesion 3. Transbronchial forceps biopsies from left lower lobe cavitary lesion   Plans:  The patient will be discharged from the PACU to home when recovered from anesthesia and after chest x-ray is reviewed. We will review the cytology, pathology and microbiology results with the patient when they become available.  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  Outpatient followup will be with Braden Caddy, NP, [name], [name].    Racheal Buddle, MD, PhD 08/17/2023, 12:43 PM [facility] [phone] or if no answer before 7:00PM call [phone] For any issues after 7:00PM please call eLink [phone]

## 2023-08-17 NOTE — Anesthesia Preprocedure Evaluation (Addendum)
 Anesthesia Evaluation  Patient identified by MRN, date of birth, ID band Patient awake    Reviewed: Allergy & Precautions, NPO status , Patient's Chart, lab work & pertinent test results  History of Anesthesia Complications (+) PONV and history of anesthetic complications  Airway Mallampati: II  TM Distance: >3 FB Neck ROM: Full    Dental no notable dental hx. (+) Teeth Intact, Dental Advisory Given   Pulmonary neg pulmonary ROS   Pulmonary exam normal breath sounds clear to auscultation       Cardiovascular hypertension, Pt. on medications Normal cardiovascular exam Rhythm:Regular Rate:Normal  TTE 2021 1. Left ventricular ejection fraction, by estimation, is 60 to 65%. The  left ventricle has normal function. The left ventricle has no regional  wall motion abnormalities.   2. Right ventricular systolic function is normal. The right ventricular  size is normal. There is normal pulmonary artery systolic pressure.   3. The mitral valve is normal in structure. No evidence of mitral valve  regurgitation. No evidence of mitral stenosis.   4. The aortic valve is normal in structure. Aortic valve regurgitation is  not visualized. No aortic stenosis is present.   5. The inferior vena cava is normal in size with greater than 50%  respiratory variability, suggesting right atrial pressure of 3 mmHg.     Neuro/Psych negative neurological ROS  negative psych ROS   GI/Hepatic Neg liver ROS,GERD  ,,  Endo/Other  Hypothyroidism    Renal/GU Renal InsufficiencyRenal disease  negative genitourinary   Musculoskeletal  (+) Arthritis ,    Abdominal   Peds  Hematology  (+) Blood dyscrasia, anemia   Anesthesia Other Findings   Reproductive/Obstetrics                             Anesthesia Physical Anesthesia Plan  ASA: 3  Anesthesia Plan: General   Post-op Pain Management:    Induction:  Intravenous  PONV Risk Score and Plan: Dexamethasone, Ondansetron, TIVA and Treatment may vary due to age or medical condition  Airway Management Planned: Oral ETT  Additional Equipment:   Intra-op Plan:   Post-operative Plan: Extubation in OR  Informed Consent: I have reviewed the patients History and Physical, chart, labs and discussed the procedure including the risks, benefits and alternatives for the proposed anesthesia with the patient or authorized representative who has indicated his/her understanding and acceptance.     Dental advisory given  Plan Discussed with: CRNA  Anesthesia Plan Comments:        Anesthesia Quick Evaluation

## 2023-08-17 NOTE — Anesthesia Postprocedure Evaluation (Signed)
 Anesthesia Post Note  Patient: Margy Shin Cirigliano  Procedure(s) Performed: BRONCHOSCOPY, WITH BIOPSY USING ELECTROMAGNETIC NAVIGATION (Bilateral) BRONCHOSCOPY, WITH BRUSH BIOPSY BRONCHOSCOPY, WITH BIOPSY BRONCHOSCOPY, WITH NEEDLE ASPIRATION BIOPSY     Patient location during evaluation: Endoscopy Anesthesia Type: General Level of consciousness: awake and alert Pain management: pain level controlled Vital Signs Assessment: post-procedure vital signs reviewed and stable Respiratory status: spontaneous breathing, nonlabored ventilation, respiratory function stable and patient connected to nasal cannula oxygen Cardiovascular status: blood pressure returned to baseline and stable Postop Assessment: no apparent nausea or vomiting Anesthetic complications: no  No notable events documented.  Last Vitals:  Vitals:   08/17/23 1350 08/17/23 1400  BP: (!) 97/51 (!) 100/53  Pulse: 79 77  Resp: (!) 23 10  Temp:    SpO2: 98% 93%    Last Pain:  Vitals:   08/17/23 1400  TempSrc:   PainSc: 3                  Nafisah Runions L Damico Partin

## 2023-08-17 NOTE — Interval H&P Note (Signed)
 History and Physical Interval Note:  08/17/2023 11:13 AM  Micheal Mcintyre  has presented today for surgery, with the diagnosis of Bilateral lung masses.  The various methods of treatment have been discussed with the patient and family. After consideration of risks, benefits and other options for treatment, the patient has consented to  Procedure(s) with comments: BRONCHOSCOPY, WITH BIOPSY USING ELECTROMAGNETIC NAVIGATION (Bilateral) - 45 minutes as a surgical intervention.  The patient's history has been reviewed, patient examined, no change in status, stable for surgery.  I have reviewed the patient's chart and labs.  Questions were answered to the patient's satisfaction.     Denson Flake

## 2023-08-17 NOTE — Progress Notes (Addendum)
 Pt presented to endoscopy today for robotic navigational bronchoscopy today with MD Byrum. Post-operatively, pt was hypotensive, with SBP in the mid-high 80s, requiring multiple doses of phenylephrine from the CRNA in recovery. Pt received albumin as well per CRNA. After albumin and the remainder of the first liter of IV fluids, pt's SBP rose to 95-99. MDA Woodrum assessed pt at bedside.  Pt also c/o 6-7/10 soreness in his throat. MDA Woodrum notified, VO for 1000 mg of acetaminophen IV. Medication given as ordered.  Kraig Peru, RN 08/17/23 1:51 PM  Addendum 1438: Pt endorsed interval improvement in pain s/p acetaminophen administration. Rated pain 3-4/10. BP remained stable, BP at 1400 was 100/53. Pt discharged to home.

## 2023-08-18 ENCOUNTER — Encounter (HOSPITAL_COMMUNITY): Payer: Self-pay | Admitting: Emergency Medicine

## 2023-08-18 ENCOUNTER — Telehealth: Payer: Self-pay

## 2023-08-18 ENCOUNTER — Other Ambulatory Visit: Payer: Self-pay

## 2023-08-18 LAB — FUNGAL STAIN REFLEX

## 2023-08-18 LAB — FUNGUS STAIN

## 2023-08-18 LAB — CYTOLOGY - NON PAP

## 2023-08-18 NOTE — Telephone Encounter (Signed)
 Called and spoke with pts pharmacy. They are needing prior auth for voriconazole medication. Please advise prior auth

## 2023-08-18 NOTE — Telephone Encounter (Signed)
 Copied from CRM [id number]. Topic: Clinical - Prescription Issue >> Aug 18, 2023 10:58 AM Eveleen Hinds B wrote: Reason for CRM: Patient has not been able to start his med. Please call.  Spoke with patient regarding prior message. Patient stated Chlorthalidone is a diuretic medication.Patient wanted to know if Dr.Byrum wanted patient to stop a lot of his medication patient could not remember why this medication was not on his medication list .   Dr.Byrum can you please advise if you want patient to stop Chlorthalidone.  Thank you

## 2023-08-18 NOTE — Telephone Encounter (Unsigned)
 Copied from CRM [id number]. Topic: Clinical - Prescription Issue >> Aug 18, 2023 10:58 AM Eveleen Hinds B wrote: Reason for CRM: Patient has not been able to start his med. Please call.

## 2023-08-18 NOTE — Telephone Encounter (Signed)
 I reviewed the medication list with Micheal Mcintyre by phone, confirmed with her that he was taking chlorthalidone.  I want him to continue the medication.  It should not interact with voriconazole. She told me that she was unable to pick up the voriconazole from their pharmacy, some kind of question about the insurance coverage.  We will try to call them to get those questions answered so he could started ASAP.

## 2023-08-19 ENCOUNTER — Telehealth: Payer: Self-pay

## 2023-08-19 LAB — ACID FAST SMEAR (AFB, MYCOBACTERIA)
Acid Fast Smear: NEGATIVE
Acid Fast Smear: NEGATIVE

## 2023-08-19 NOTE — Telephone Encounter (Signed)
 Spoke with Micheal Mcintyre regarding prior auth needed for Voriconazole. Informed pt pharmacy will have med ready within the hour. Pt verbalized understanding & had no concerns. NFN

## 2023-08-19 NOTE — Telephone Encounter (Signed)
*  Pulm  Pharmacy Patient Advocate Encounter   Received notification from CoverMyMeds that prior authorization for Voriconazole 200MG  tablets  is required/requested.   Insurance verification completed.   The patient is insured through [facility] .   Per test claim: PA required; PA submitted to above mentioned insurance via CoverMyMeds Key/confirmation #/EOC [facility] Status is pending

## 2023-08-19 NOTE — Telephone Encounter (Signed)
 Copied from CRM [id number]. Topic: Clinical - Prescription Issue >> Aug 19, 2023 11:14 AM Ambrose Junk wrote: Reason for CRM: Patient called regarding   Chlorthalidone. When will prescription be ready. Please call patient. Patient concerned waiting for script following surgery.  Spoke with patient regarding prior message. Advised patient a PA haas been sent to his insurance for Voriconazole 200MG  tablets . Patient stated he has spoke with his insurance company prior to me calling and they stated they are going to approve his medication. Patient is waiting for the approval from the pharmacy when to pick up the mediation  Advised patient our office will be closed tomorrow and will be back open on Monday.  Patient voice was understanding.Nothing else further needed.

## 2023-08-19 NOTE — Telephone Encounter (Signed)
 Copied from CRM [id number]. Topic: Clinical - Prescription Issue >> Aug 19, 2023  2:17 PM [name] wrote: Reason for CRM: [facility] called stating the PA has been approved starting 08/19/2023 for 6 months for medication Voriconazole 200MG  tablets. Rep stated she would be sending over a detailed fax today as well.  Spoke with patient regarding prior message. Advised patient he has been approved for his medication and the pharmacy should be reaching out to him . Patient's voice was understanding.Nothing else further needed

## 2023-08-19 NOTE — Telephone Encounter (Signed)
 Pharmacy Patient Advocate Encounter  Received notification from [facility] that Prior Authorization for Voriconazole 200MG  tablets  has been APPROVED from 08/19/2023 to 02/18/2024

## 2023-08-19 NOTE — Telephone Encounter (Signed)
 PA request has been Submitted. New Encounter has been or will be created for follow up. For additional info see Pharmacy Prior Auth telephone encounter from 04/17.

## 2023-08-20 ENCOUNTER — Other Ambulatory Visit: Payer: Self-pay

## 2023-08-20 LAB — FUNGUS STAIN

## 2023-08-20 LAB — FUNGAL STAIN REFLEX

## 2023-08-24 ENCOUNTER — Encounter: Payer: Self-pay | Admitting: Acute Care

## 2023-08-24 ENCOUNTER — Ambulatory Visit (INDEPENDENT_AMBULATORY_CARE_PROVIDER_SITE_OTHER): Admitting: Acute Care

## 2023-08-24 VITALS — BP 118/54 | HR 81 | Ht 70.0 in | Wt 118.2 lb

## 2023-08-24 DIAGNOSIS — R918 Other nonspecific abnormal finding of lung field: Secondary | ICD-10-CM

## 2023-08-24 DIAGNOSIS — Z8505 Personal history of malignant neoplasm of liver: Secondary | ICD-10-CM | POA: Diagnosis not present

## 2023-08-24 DIAGNOSIS — Z85830 Personal history of malignant neoplasm of bone: Secondary | ICD-10-CM

## 2023-08-24 DIAGNOSIS — Z85528 Personal history of other malignant neoplasm of kidney: Secondary | ICD-10-CM | POA: Diagnosis not present

## 2023-08-24 NOTE — Progress Notes (Signed)
 History of Present Illness Micheal Mcintyre is a 83 y.o. male never smoker  referred to Dr. Baldwin Mcintyre for evaluation of a lung mass in setting of immunocompromised patient with history or metastatic renal cancer currently undergoing treatment.  Synopsis 83 year old gentleman with metastatic renal cell carcinoma followed by Dr. Marguerita Mcintyre. I saw him in late March after a CT scan of his chest 06/30/2023 revealed new right upper lobe, left upper lobe partially cavitary opacities concerning for either infectious process or malignancy. He was treated with with doxycycline  for 2 weeks. We repeated his CT scan of the chest to look for interval improvement after antibiotic treatment.   CT scan of the chest done 08/09/2023 shows enlarged more consolidated opacities in the right upper lobe, right lower lobe, still with areas of cavitation. There is an evolving less prominent left lower lobe infiltrate with partial cavitation as well that was not present in February. Also some scattered smaller subcentimeter pulmonary nodules right lower lobe predominant. Dr. Baldwin Mcintyre spoke with the patient and his wife, as there were persistent mass like opacities, even after treatment wioth antibiotics, he has  recommended navigational bronchoscopy for biopsies and culture data. This was done 08/17/2023. He is here today for follow up after bronchoscopies, to ensure he is doing well, and also to review cytology.    08/24/2023 Pt. Presents for follow up after bronchoscopy with biopsies. He states he has done well after the procedure. No bleeding, fever, discolored secretions or adverse reaction to anesthesia.  He states he is doing well overall.  He is in a wheelchair today.  Dr. Baldwin Mcintyre have reviewed the cytology results with the patient's wife by phone on 4/16.  The biopsy of the right upper lobe opacity was negative for malignant cells however was notable for fungus consistent with Aspergillus.  At this time he started the patient on  voriconazole  twice daily x 1 month. There were noted interactions with some of the patient's maintenance chemo meds, specifically lenvatinib  and everolimus  . These are on hold while he is taking the antifungal.   Pt.  has had some visual disturbances since starting the medication , which resolve when his wife uses systane eye drops. He states it occurs about 1 hour after medication, and then resolves. This is a well documented side effect. Pt. States it is tolerable. I have asked him to let us  know if this gets worse.   We will do a follow up Ct Chest in 1 month to re-evaluate the right upper lobe opacity to see if he is getting benefit from the medication.   He does have swollen ankles today in the office.  Patient is on the diuretic chlorthalidone.  I reassured both he and his wife that this medication does not interact with the fluconazole and that he should continue taking it.  Test Results: Cytology 08/17/2023 FINAL MICROSCOPIC DIAGNOSIS:  A. LUNG, RUL OPACITY, FINE NEEDLE ASPIRATION:  - No malignant cells identified  - Fungus consistent with Aspergillus   B. LUNG, RUL OPACITY, BRUSHING:  - No malignant cells identified  - Fungus consistent with Aspergillus    D. LUNG, LUL SUPERIOR SEGMENT, FINE NEEDLE ASPIRATION  BIOPSIES:  - No malignant cells present  - Few benign/reactive mesothelial cells  - Mixed inflammatory cells   Super D CT 08/09/2023 Pulmonary metastatic disease, minimally progressive. 2. New and evolving areas of consolidation, ground-glass and cavitation, indicative of a fungal pneumonia. 3. Stable hepatic metastatic disease. 4. Stable mildly hyperdense lesion along the pancreatic  tail, possibly a metastasis. 5.  Aortic atherosclerosis (ICD10-I70.0).      Micro Negative for fungus, but biopsy was + for aspergillus.  AFB Negative  Started on voriconazole  by Dr. Baldwin Mcintyre 08/18/2023.       Latest Ref Rng & Units 08/05/2023    2:35 PM 07/07/2023    2:59 PM 06/22/2023     2:06 PM  CBC  WBC 4.0 - 10.5 K/uL 8.4  11.1  6.6   Hemoglobin 13.0 - 17.0 g/dL 9.6  21.3  08.6   Hematocrit 39.0 - 52.0 % 30.4  36.1  34.2   Platelets 150 - 400 K/uL 297  146  166        Latest Ref Rng & Units 08/05/2023    2:35 PM 07/07/2023    2:59 PM 06/22/2023    2:06 PM  BMP  Glucose 70 - 99 mg/dL 578  469  629   BUN 8 - 23 mg/dL 13  22  22    Creatinine 0.61 - 1.24 mg/dL 5.28  4.13  2.44   Sodium 135 - 145 mmol/L 138  132  130   Potassium 3.5 - 5.1 mmol/L 4.4  4.0  3.3   Chloride 98 - 111 mmol/L 101  92  88   CO2 22 - 32 mmol/L 32  32  31   Calcium  8.9 - 10.3 mg/dL 8.7  8.5  9.3     BNP No results found for: "BNP"  ProBNP No results found for: "PROBNP"  PFT No results found for: "FEV1PRE", "FEV1POST", "FVCPRE", "FVCPOST", "TLC", "DLCOUNC", "PREFEV1FVCRT", "PSTFEV1FVCRT"  DG Chest Port 1 View Result Date: 08/17/2023 CLINICAL DATA:  [id number] S/P bronchoscopy with biopsy [id number]. EXAM: PORTABLE CHEST 1 VIEW COMPARISON:  CT scan chest from 08/09/2023. FINDINGS: Redemonstration of irregular mass overlying the left mid lung zone, without significant interval change. There is persistent cavitary lesion lateral to it. There are also heterogeneous nonspecific opacities overlying the right upper mid lung zones, which are also grossly unchanged. Bilateral costophrenic angles are clear. Normal cardio-mediastinal silhouette. No acute osseous abnormalities. The soft tissues are within normal limits. IMPRESSION: *Redemonstration of irregular mass overlying the left mid lung zone, without significant interval change. There is persistent cavitary lesion lateral to it. *There are also heterogeneous nonspecific opacities overlying the right upper mid lung zones, which are also grossly unchanged. Electronically Signed   By: Beula Brunswick M.D.   On: 08/17/2023 13:22   DG C-ARM BRONCHOSCOPY Result Date: 08/17/2023 C-ARM BRONCHOSCOPY: Fluoroscopy was utilized by the requesting physician.  No  radiographic interpretation.   CT Super D Chest Wo Contrast Result Date: 08/13/2023 CLINICAL DATA:  History of lung cancer. Renal cell carcinoma. * Tracking Code: BO * EXAM: CT CHEST WITHOUT CONTRAST TECHNIQUE: Multidetector CT imaging of the chest was performed using thin slice collimation for electromagnetic bronchoscopy planning purposes, without intravenous contrast. RADIATION DOSE REDUCTION: This exam was performed according to the departmental dose-optimization program which includes automated exposure control, adjustment of the mA and/or kV according to patient size and/or use of iterative reconstruction technique. COMPARISON:  06/30/2023. FINDINGS: Cardiovascular: Atherosclerotic calcification of the aorta and aortic valve. Heart size normal. No pericardial effusion. Mediastinum/Nodes: No pathologically enlarged mediastinal or axillary lymph nodes. Hilar regions are difficult to definitively evaluate without IV contrast. Esophagus is grossly unremarkable. Lungs/Pleura: Cavitary masslike consolidation in the posterior right upper lobe measures 3.0 x 4.5 cm (8/28), previously 3.5 x 3.6 cm. Adjacent bronchiectasis and volume loss. New patchy consolidation and ground-glass with bronchiectasis  in the inferior anterior segment right upper lobe (8/50). Previously seen mixed ground-glass and consolidation in the posterolateral left upper lobe is now cavitary in appearance. New mixed ground-glass and consolidation in the posterior left lower lobe (8/47). Additional areas of peribronchovascular nodularity, discrete pulmonary nodules and consolidation bilaterally, as before. Some nodules are cavitary in appearance. Index 7 mm posterior right lower lobe nodule (8/103), stable. A second index nodule in the posterolateral left lower lobe has enlarged, now measuring 10 mm in greatest axial dimension, previously 8 mm. Cylindrical bronchiectasis. No pleural fluid. Airway is unremarkable. Upper Abdomen: Hypodense lesions  in the liver measure up to 3.0 cm in the dome of the right hepatic lobe, stable. Right nephrectomy. Low-attenuation lesions in the left kidney. Mildly hyperdense lesion along the pancreatic tail measures 11 mm (2/133), stable. No specific follow-up necessary. Visualized portions of the liver, gallbladder, adrenal glands, kidneys, spleen, pancreas, stomach and bowel are otherwise grossly unremarkable. No upper abdominal adenopathy. Musculoskeletal: Degenerative changes in the spine. No worrisome lytic or sclerotic lesions. IMPRESSION: 1. Pulmonary metastatic disease, minimally progressive. 2. New and evolving areas of consolidation, ground-glass and cavitation, indicative of a fungal pneumonia. 3. Stable hepatic metastatic disease. 4. Stable mildly hyperdense lesion along the pancreatic tail, possibly a metastasis. 5.  Aortic atherosclerosis (ICD10-I70.0). Electronically Signed   By: Shearon Denis M.D.   On: 08/13/2023 13:11     Past medical hx Past Medical History:  Diagnosis Date   Arthritis    GERD (gastroesophageal reflux disease)    Hypertension    Hypothyroidism    Medicare annual wellness visit, subsequent 08/19/2014   Osteoarthritis 09/21/2007   Qualifier: Diagnosis of  By: Kingsley Penny MD, Darlene Ehlers    PONV (postoperative nausea and vomiting)    Preventative health care 06/07/2016   Right shoulder pain 12/03/2016     Social History   Tobacco Use   Smoking status: Never    Passive exposure: Never   Smokeless tobacco: Never  Vaping Use   Vaping status: Never Used  Substance Use Topics   Alcohol use: Not Currently    Comment: occ beer   Drug use: No    Mr.Halley reports that he has never smoked. He has never been exposed to tobacco smoke. He has never used smokeless tobacco. He reports that he does not currently use alcohol. He reports that he does not use drugs.  Tobacco Cessation: Counseling given: Not Answered Never smoker  Past surgical hx, Family hx, Social hx all  reviewed.  Current Outpatient Medications on File Prior to Visit  Medication Sig   acetaminophen  (TYLENOL ) 500 MG tablet Take 1,000 mg by mouth every 6 (six) hours as needed for moderate pain.   chlorthalidone (HYGROTON) 25 MG tablet Take 25 mg by mouth every other day.   levothyroxine  (SYNTHROID ) 50 MCG tablet TAKE 1 TABLET(50 MCG) BY MOUTH DAILY BEFORE BREAKFAST   Multiple Vitamin (MULTIVITAMIN WITH MINERALS) TABS tablet Take 1 tablet by mouth 4 (four) times a week.   telmisartan  (MICARDIS ) 80 MG tablet TAKE 1/2 TABLET BY MOUTH TWICE DAILY   voriconazole  (VFEND ) 200 MG tablet Take 1 tablet (200 mg total) by mouth 2 (two) times daily.   No current facility-administered medications on file prior to visit.     Allergies  Allergen Reactions   Statins Other (See Comments)    myalgia    Review Of Systems:  Constitutional:   +  weight loss, Nonight sweats,  Fevers, chills, +fatigue, or  lassitude.  HEENT:  No headaches,  Difficulty swallowing,  Tooth/dental problems, or  Sore throat,                No sneezing, itching, ear ache, nasal congestion, post nasal drip,   CV:  No chest pain,  Orthopnea, PND, + 1-2 + bilateral swelling in lower extremities, No anasarca, dizziness, palpitations, syncope.   GI  No heartburn, indigestion, abdominal pain, nausea, vomiting, diarrhea, change in bowel habits, loss of appetite, bloody stools.   Resp: + shortness of breath with exertion none at rest.  No excess mucus, no productive cough,  No non-productive cough,  No coughing up of blood.  No change in color of mucus.  No wheezing.  No chest wall deformity  Skin: no rash or lesions.  GU: no dysuria, change in color of urine, no urgency or frequency.  No flank pain, no hematuria   MS:  No joint pain or swelling.  No decreased range of motion.  No back pain.  Psych:  No change in mood or affect. No depression or anxiety.  No memory loss.   Vital Signs BP (!) 118/54 (BP Location: Right Arm,  Patient Position: Sitting, Cuff Size: Normal)   Pulse 81   Ht 5\' 10"  (1.778 m)   Wt 118 lb 3.2 oz (53.6 kg)   SpO2 98%   BMI 16.96 kg/m    Physical Exam:  General- No distress,  A&Ox3, pleasant , frail elderly gentleman in a wheelchair ENT: No sinus tenderness, TM clear, pale nasal mucosa, no oral exudate,no post nasal drip, no LAN Cardiac: S1, S2, regular rate and rhythm, no murmur Chest: No wheeze/ rales/ dullness; no accessory muscle use, no nasal flaring, no sternal retractions, slightly diminished per bases Abd.: Soft Non-tender, nondistended, bowel sounds positive,Body mass index is 16.96 kg/m.  Ext: No clubbing cyanosis, edema, no obvious deformities Neuro: Physical deconditioning, moving all extremities x 4, alert and oriented x 3 Skin: No rashes, warm and dry, no obvious lesions Psych: normal mood and behavior   Assessment/Plan Bilateral upper lobe opacities with cavitation now developing in left lower lobe as well. Concerning for opportunistic infection and patient currently undergoing renal cell cancer treatment Concerned this could possibly be an malignancy>> negative for malignancy on biopsy Post bronchoscopy with biopsies Intrabronchial cultures positive for Aspergillus Plan Your biopsies were negative for malignancy. This is good news. They were + for Aspergillus , and Dr. Baldwin Mcintyre has started you on voriconazole  .  Take as prescribed.  I have communicated with Dr. Anibal Barker nurse navigator to let her know you are currently not taking lenvatinib  and everolimus  due to drug interactions with the voriconazole  Let us  know if the visual disturbances get worse, and we will talk with Dr. Baldwin Mcintyre about whether we need to change the medication.  We will do a follow up CT Chest mid May 2025 to re-evaluate the upper lobe opacities noted on CT. You will get a call to get this scheduled.  You will follow up with either Dr. Baldwin Mcintyre or myself 1-2 weeks after the scan to review the  results. It is ok to take the chlorthalidone . It does not interact with the voriconazole .  Follow up with Dr. Marguerita Mcintyre as scheduled.  Call if you need us  sooner.  Please contact office for sooner follow up if symptoms do not improve or worsen or seek emergency care    I spent 40 minutes dedicated to the care of this patient on the date of this encounter to include pre-visit  review of records, face-to-face time with the patient discussing conditions above, post visit ordering of testing, clinical documentation with the electronic health record, making appropriate referrals as documented, and communicating necessary information to the patient's healthcare team.    Raejean Bullock, NP 08/24/2023  3:08 PM

## 2023-08-24 NOTE — Patient Instructions (Addendum)
 It is good to see you today. Your biopsies were negative for malignancy. This is good news. They were + for Aspergillus , and [name] has started you on voriconazole  .  Take as prescribed.  Let us  know if the visual disturbances get worse, and we will talk with [name] about whether we need to change the medication.  We will do a follow up CT Chest mid May 2025 to re-evaluate. You will get a call to get this scheduled.  You will follow up with either [name] or myself 1-2 weeks after the scan to review the results. It is ok to take the chlorthalidone . It does not interact with the voriconazole .  Follow up with [name] as scheduled.  Call if you need us  sooner.  Please contact office for sooner follow up if symptoms do not improve or worsen or seek emergency care

## 2023-08-25 ENCOUNTER — Telehealth: Payer: Self-pay

## 2023-08-25 ENCOUNTER — Other Ambulatory Visit: Payer: Self-pay | Admitting: Acute Care

## 2023-08-25 DIAGNOSIS — R918 Other nonspecific abnormal finding of lung field: Secondary | ICD-10-CM

## 2023-08-25 NOTE — Telephone Encounter (Signed)
 LVM in regards to change in CT scheduling to 6weeks out leaving my chart msg as well

## 2023-08-30 DIAGNOSIS — N183 Chronic kidney disease, stage 3 unspecified: Secondary | ICD-10-CM | POA: Diagnosis not present

## 2023-09-05 NOTE — Progress Notes (Unsigned)
 [facility] OFFICE PROGRESS NOTE  Neda Balk, MD 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   PRIOR THERAPY: 1) Status post right open radical nephrectomy completed on October 15, 2021 by Dr. Doy Gene.  2) Keytruda  200 Mg IV every 3 weeks started December 03, 2021.  Status post 9 cycles.  Last dose was given May 21, 2022.  This was discontinued secondary to disease progression. 3) Systemic treatment with nivolumab  480 Mg IV every 4 weeks in addition to Cabometyx  40 mg p.o. daily.  First dose June 18, 2022.  Status post 7 cycles.  His Cabometyx  has been on hold recently due to adverse side effects. His dose was reduced in May to 20 mg p.o. daily but he continues to have weakness, diarrhea, and dehydration. This has been on hold since 5/19.  Last dose was given December 02, 2022 discontinued secondary to disease progression    CURRENT THERAPY:  Starting 14 mg of lenvatinib  plus everolimus  5 mg on 02/02/23. On hold since 07/07/23.   INTERVAL HISTORY: Micheal Mcintyre 83 y.o. male returns to the clinic today for follow-up visit accompanied by his wife.  The patient was last seen in the clinic by [name] on/3/25.  In summary when the patient was seen in March 2025, he was undergoing treatment with Lenvima  and everolimus .  However the patient was having a challenging time with treatment for appetite and weight loss.  Intolerant having significant fatigue.  He had imaging studies that show concern for possible atypical infection.  Therefore he was put on 2 weeks of antibiotics and referred to pulmonary medicine.  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  The patient is currently being treated for fungal  infection at this time.  At this time he started the patient on voriconazole  twice daily x 1 month.  Due to drug to drug interactions with his chemo medicine this is currently on hold.  He is expected to have a repeat CT scan in approximately 1 month to assess response to therapy. His CT is scheduled for 09/07/23.   Since being off treatment the patient is feeling ***.  He denies any fever, chills, or night sweats.  He was endorsing swelling?  He is currently taking ***his shortness of breath and cough is ***.  He denies any chest.  He denies any nausea, vomiting, diarrhea, or constipation.  He denies any headaches.  He was having some vision changes for which he talked to pulmonary medicine who recommended ***and his vision ***.   He also struggles with iron deficiency anemia for which he is currently on***supplements.  Nephrologist manages blood pressure medications?  He is here today for evaluation repeat blood work.  MEDICAL HISTORY: Past Medical History:  Diagnosis Date   Arthritis    GERD (gastroesophageal reflux disease)    Hypertension    Hypothyroidism    Medicare annual wellness visit, subsequent 08/19/2014   Osteoarthritis 09/21/2007   Qualifier: Diagnosis of  By: Kingsley Penny MD, Cindie Credit R    PONV (postoperative nausea and vomiting)    Preventative health care 06/07/2016   Right shoulder pain 12/03/2016    ALLERGIES:  is allergic to statins.  MEDICATIONS:  Current Outpatient Medications  Medication  Sig Dispense Refill   acetaminophen  (TYLENOL ) 500 MG tablet Take 1,000 mg by mouth every 6 (six) hours as needed for moderate pain.     chlorthalidone (HYGROTON) 25 MG tablet Take 25 mg by mouth every other day.     levothyroxine  (SYNTHROID ) 50 MCG tablet TAKE 1 TABLET(50 MCG) BY MOUTH DAILY BEFORE BREAKFAST 30 tablet 1   Multiple Vitamin (MULTIVITAMIN WITH MINERALS) TABS tablet Take 1 tablet by mouth 4 (four) times a week.     telmisartan  (MICARDIS ) 80 MG tablet TAKE 1/2 TABLET BY  MOUTH TWICE DAILY 180 tablet 1   voriconazole  (VFEND ) 200 MG tablet Take 1 tablet (200 mg total) by mouth 2 (two) times daily. 60 tablet 3   No current facility-administered medications for this visit.    SURGICAL HISTORY:  Past Surgical History:  Procedure Laterality Date   BRONCHIAL BIOPSY  08/17/2023   Procedure: BRONCHOSCOPY, WITH BIOPSY;  Surgeon: Denson Flake, MD;  Location: [facility] ENDOSCOPY;  Service: Pulmonary;;   BRONCHIAL BRUSHINGS  08/17/2023   Procedure: BRONCHOSCOPY, WITH BRUSH BIOPSY;  Surgeon: Denson Flake, MD;  Location: [facility] ENDOSCOPY;  Service: Pulmonary;;   BRONCHIAL NEEDLE ASPIRATION BIOPSY  08/17/2023   Procedure: BRONCHOSCOPY, WITH NEEDLE ASPIRATION BIOPSY;  Surgeon: Denson Flake, MD;  Location: [facility] ENDOSCOPY;  Service: Pulmonary;;   COLONOSCOPY     HIP SURGERY Left    1991   JOINT REPLACEMENT Right 2005   hip   NEPHRECTOMY Right 10/15/2021   Procedure: NEPHRECTOMY, OPEN;  Surgeon: Lahoma Pigg, MD;  Location: [facility] ORS;  Service: Urology;  Laterality: Right;   nose skin cancer removal      REVIEW OF SYSTEMS:   Review of Systems  Constitutional: Negative for appetite change, chills, fatigue, fever and unexpected weight change.  HENT:   Negative for mouth sores, nosebleeds, sore throat and trouble swallowing.   Eyes: Negative for eye problems and icterus.  Respiratory: Negative for cough, hemoptysis, shortness of breath and wheezing.   Cardiovascular: Negative for chest pain and leg swelling.  Gastrointestinal: Negative for abdominal pain, constipation, diarrhea, nausea and vomiting.  Genitourinary: Negative for bladder incontinence, difficulty urinating, dysuria, frequency and hematuria.   Musculoskeletal: Negative for back pain, gait problem, neck pain and neck stiffness.  Skin: Negative for itching and rash.  Neurological: Negative for dizziness, extremity weakness, gait problem, headaches, light-headedness and seizures.  Hematological: Negative for  adenopathy. Does not bruise/bleed easily.  Psychiatric/Behavioral: Negative for confusion, depression and sleep disturbance. The patient is not nervous/anxious.     PHYSICAL EXAMINATION:  There were no vitals taken for this visit.  ECOG PERFORMANCE STATUS: {CHL ONC ECOG [id number]  Physical Exam  Constitutional: Oriented to person, place, and time and well-developed, well-nourished, and in no distress. No distress.  HENT:  Head: Normocephalic and atraumatic.  Mouth/Throat: Oropharynx is clear and moist. No oropharyngeal exudate.  Eyes: Conjunctivae are normal. Right eye exhibits no discharge. Left eye exhibits no discharge. No scleral icterus.  Neck: Normal range of motion. Neck supple.  Cardiovascular: Normal rate, regular rhythm, normal heart sounds and intact distal pulses.   Pulmonary/Chest: Effort normal and breath sounds normal. No respiratory distress. No wheezes. No rales.  Abdominal: Soft. Bowel sounds are normal. Exhibits no distension and no mass. There is no tenderness.  Musculoskeletal: Normal range of motion. Exhibits no edema.  Lymphadenopathy:    No cervical adenopathy.  Neurological: Alert and oriented to person, place, and time. Exhibits normal muscle tone. Gait normal. Coordination normal.  Skin:  Skin is warm and dry. No rash noted. Not diaphoretic. No erythema. No pallor.  Psychiatric: Mood, memory and judgment normal.  Vitals reviewed.  LABORATORY DATA: Lab Results  Component Value Date   WBC 8.4 08/05/2023   HGB 9.6 (L) 08/05/2023   HCT 30.4 (L) 08/05/2023   MCV 93.0 08/05/2023   PLT 297 08/05/2023      Chemistry      Component Value Date/Time   NA 138 08/05/2023 1435   NA 140 04/22/2022 0000   K 4.4 08/05/2023 1435   CL 101 08/05/2023 1435   CO2 32 08/05/2023 1435   BUN 13 08/05/2023 1435   BUN 21 04/22/2022 0000   CREATININE 1.10 08/05/2023 1435   CREATININE 1.09 02/06/2020 1306   GLU 88 04/22/2022 0000      Component Value Date/Time    CALCIUM  8.7 (L) 08/05/2023 1435   ALKPHOS 181 (H) 08/05/2023 1435   AST 21 08/05/2023 1435   ALT 16 08/05/2023 1435   BILITOT 0.5 08/05/2023 1435       RADIOGRAPHIC STUDIES:  DG Chest Port 1 View Result Date: 08/17/2023 CLINICAL DATA:  [id number] S/P bronchoscopy with biopsy [id number]. EXAM: PORTABLE CHEST 1 VIEW COMPARISON:  CT scan chest from 08/09/2023. FINDINGS: Redemonstration of irregular mass overlying the left mid lung zone, without significant interval change. There is persistent cavitary lesion lateral to it. There are also heterogeneous nonspecific opacities overlying the right upper mid lung zones, which are also grossly unchanged. Bilateral costophrenic angles are clear. Normal cardio-mediastinal silhouette. No acute osseous abnormalities. The soft tissues are within normal limits. IMPRESSION: *Redemonstration of irregular mass overlying the left mid lung zone, without significant interval change. There is persistent cavitary lesion lateral to it. *There are also heterogeneous nonspecific opacities overlying the right upper mid lung zones, which are also grossly unchanged. Electronically Signed   By: Beula Brunswick M.D.   On: 08/17/2023 13:22   DG C-ARM BRONCHOSCOPY Result Date: 08/17/2023 C-ARM BRONCHOSCOPY: Fluoroscopy was utilized by the requesting physician.  No radiographic interpretation.   CT Super D Chest Wo Contrast Result Date: 08/13/2023 CLINICAL DATA:  History of lung cancer. Renal cell carcinoma. * Tracking Code: BO * EXAM: CT CHEST WITHOUT CONTRAST TECHNIQUE: Multidetector CT imaging of the chest was performed using thin slice collimation for electromagnetic bronchoscopy planning purposes, without intravenous contrast. RADIATION DOSE REDUCTION: This exam was performed according to the departmental dose-optimization program which includes automated exposure control, adjustment of the mA and/or kV according to patient size and/or use of iterative reconstruction technique.  COMPARISON:  06/30/2023. FINDINGS: Cardiovascular: Atherosclerotic calcification of the aorta and aortic valve. Heart size normal. No pericardial effusion. Mediastinum/Nodes: No pathologically enlarged mediastinal or axillary lymph nodes. Hilar regions are difficult to definitively evaluate without IV contrast. Esophagus is grossly unremarkable. Lungs/Pleura: Cavitary masslike consolidation in the posterior right upper lobe measures 3.0 x 4.5 cm (8/28), previously 3.5 x 3.6 cm. Adjacent bronchiectasis and volume loss. New patchy consolidation and ground-glass with bronchiectasis in the inferior anterior segment right upper lobe (8/50). Previously seen mixed ground-glass and consolidation in the posterolateral left upper lobe is now cavitary in appearance. New mixed ground-glass and consolidation in the posterior left lower lobe (8/47). Additional areas of peribronchovascular nodularity, discrete pulmonary nodules and consolidation bilaterally, as before. Some nodules are cavitary in appearance. Index 7 mm posterior right lower lobe nodule (8/103), stable. A second index nodule in the posterolateral left lower lobe has enlarged, now measuring 10 mm in greatest axial dimension, previously 8  mm. Cylindrical bronchiectasis. No pleural fluid. Airway is unremarkable. Upper Abdomen: Hypodense lesions in the liver measure up to 3.0 cm in the dome of the right hepatic lobe, stable. Right nephrectomy. Low-attenuation lesions in the left kidney. Mildly hyperdense lesion along the pancreatic tail measures 11 mm (2/133), stable. No specific follow-up necessary. Visualized portions of the liver, gallbladder, adrenal glands, kidneys, spleen, pancreas, stomach and bowel are otherwise grossly unremarkable. No upper abdominal adenopathy. Musculoskeletal: Degenerative changes in the spine. No worrisome lytic or sclerotic lesions. IMPRESSION: 1. Pulmonary metastatic disease, minimally progressive. 2. New and evolving areas of  consolidation, ground-glass and cavitation, indicative of a fungal pneumonia. 3. Stable hepatic metastatic disease. 4. Stable mildly hyperdense lesion along the pancreatic tail, possibly a metastasis. 5.  Aortic atherosclerosis (ICD10-I70.0). Electronically Signed   By: Shearon Denis M.D.   On: 08/13/2023 13:11     ASSESSMENT/PLAN:  This is a very pleasant 83 year old Caucasian male diagnosed with metastatic papillary renal cell carcinoma.  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  This was diagnosed in June 2023.  He is status post a right open radical nephrectomy.  Patient was found to have metastatic disease to the liver and bone in February 2024.   The patient had previously underwent adjuvant treatment with Keytruda  20 mg IV every 3 weeks for 9 cycles.   When he saw [name] on 06/11/2022 and reviewed his MRI with him which showed multiple new rim-enhancing lesions throughout the liver consistent metastatic disease.  There was also a rim-enhancing lesion in the inferior endplate of L1 measuring 2.1 x 1.1 cm concerning for metastatic disease.  There is no local recurrence or lymphadenopathy. He also had a chest CT by Dr. Freddi Jaeger which showed pulmonary metastatic disease.    [name] recommended that the patient start treatment with nivolumab  480 mg IV every 4 weeks in addition to Cabometyx  40 mg grams p.o. daily.  He started this on 06/18/2022. He is status post 7 cycles of nivolumab .   He then had GI side effects of cabometyx  with diarrhea and weight loss. His treatment has periodically been on hold. His dose was reduced to 20 mg in May 2024, but he continued to have intolerance. Therefore, this has been on hold since 5/19. His diarrhea and weight loss have since resolved.    Unfortunately, the patient's restaging CT scan from August showed disease progression.   The patient saw Dr. Zada Herrlich at [facility] for second opinion.   She recommended lenvatinib  plus everolimus .  She recommended starting at a small dose reduction of 14 mg in standard dose everolimus  standard dose 5 mg given his prior issues with cabo. If tolerates very well, could increase lenvatinib .  He had progression in the future, then she would recommend clinical trial if available versus axitinib. Therefore, he started this on 01/28/23. He has had some mouth sores, thrombocytopenia, mild nausea, hypertension, and sensitivity on the bottom of his feet.  His treatment has been on hold since 07/06/33.   Imaging at that time showed concern for atypical infection.   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 He is currently being treated for this and expected to have repeat imaging in 1 month.   Due to drug to drug interactions with *** and lenvima  and everlolimus, this is currently on hold while he gets treated for his fungal infection.   He is expected to have a repeat  CT on ***.   Labs were reviewed.   He will continue to stay off treatment for now.   We will see him back 1 week after his CT scan. 5/6  The patient was advised to call immediately if she has any concerning symptoms in the interval. The patient voices understanding of current disease status and treatment options and is in agreement with the current care plan. All questions were answered. The patient knows to call the clinic with any problems, questions or concerns. We can certainly see the patient much sooner if necessary             No orders of the defined types were placed in this encounter.    I spent {CHL ONC TIME VISIT - ZOXWR:[id number]} counseling the patient face to face. The total time spent in the appointment was {CHL ONC TIME VISIT - BJYNW:[id number]}.  Juergen Hardenbrook L Navin Dogan, PA-C 09/05/23

## 2023-09-07 ENCOUNTER — Other Ambulatory Visit: Payer: Self-pay

## 2023-09-07 ENCOUNTER — Ambulatory Visit (HOSPITAL_BASED_OUTPATIENT_CLINIC_OR_DEPARTMENT_OTHER)
Admission: RE | Admit: 2023-09-07 | Discharge: 2023-09-07 | Disposition: A | Discharge status: Home / Self Care | Source: Ambulatory Visit | Attending: Acute Care | Admitting: Acute Care

## 2023-09-07 ENCOUNTER — Telehealth: Payer: Self-pay

## 2023-09-07 ENCOUNTER — Other Ambulatory Visit (HOSPITAL_COMMUNITY): Payer: Self-pay

## 2023-09-07 DIAGNOSIS — J984 Other disorders of lung: Secondary | ICD-10-CM | POA: Diagnosis not present

## 2023-09-07 DIAGNOSIS — R918 Other nonspecific abnormal finding of lung field: Secondary | ICD-10-CM | POA: Insufficient documentation

## 2023-09-07 DIAGNOSIS — J479 Bronchiectasis, uncomplicated: Secondary | ICD-10-CM | POA: Diagnosis not present

## 2023-09-07 NOTE — Telephone Encounter (Signed)
 Called pt to schedule follow up after ct was finished which is being done today and to call out office to schedule an appointment for 2 weeks from now

## 2023-09-08 ENCOUNTER — Inpatient Hospital Stay (HOSPITAL_BASED_OUTPATIENT_CLINIC_OR_DEPARTMENT_OTHER): Payer: PRIVATE HEALTH INSURANCE | Admitting: Physician Assistant

## 2023-09-08 ENCOUNTER — Inpatient Hospital Stay: Payer: PRIVATE HEALTH INSURANCE | Attending: Oncology

## 2023-09-08 ENCOUNTER — Encounter: Payer: Self-pay | Admitting: Internal Medicine

## 2023-09-08 VITALS — BP 128/64 | HR 86 | Temp 98.1°F | Resp 14 | Wt 118.2 lb

## 2023-09-08 DIAGNOSIS — C7951 Secondary malignant neoplasm of bone: Secondary | ICD-10-CM | POA: Diagnosis not present

## 2023-09-08 DIAGNOSIS — E039 Hypothyroidism, unspecified: Secondary | ICD-10-CM | POA: Insufficient documentation

## 2023-09-08 DIAGNOSIS — I700 Atherosclerosis of aorta: Secondary | ICD-10-CM | POA: Insufficient documentation

## 2023-09-08 DIAGNOSIS — C787 Secondary malignant neoplasm of liver and intrahepatic bile duct: Secondary | ICD-10-CM | POA: Insufficient documentation

## 2023-09-08 DIAGNOSIS — Z79899 Other long term (current) drug therapy: Secondary | ICD-10-CM | POA: Diagnosis not present

## 2023-09-08 DIAGNOSIS — M129 Arthropathy, unspecified: Secondary | ICD-10-CM | POA: Insufficient documentation

## 2023-09-08 DIAGNOSIS — D539 Nutritional anemia, unspecified: Secondary | ICD-10-CM

## 2023-09-08 DIAGNOSIS — M1990 Unspecified osteoarthritis, unspecified site: Secondary | ICD-10-CM | POA: Diagnosis not present

## 2023-09-08 DIAGNOSIS — Z905 Acquired absence of kidney: Secondary | ICD-10-CM | POA: Insufficient documentation

## 2023-09-08 DIAGNOSIS — K219 Gastro-esophageal reflux disease without esophagitis: Secondary | ICD-10-CM | POA: Insufficient documentation

## 2023-09-08 DIAGNOSIS — I10 Essential (primary) hypertension: Secondary | ICD-10-CM | POA: Insufficient documentation

## 2023-09-08 DIAGNOSIS — E860 Dehydration: Secondary | ICD-10-CM | POA: Insufficient documentation

## 2023-09-08 DIAGNOSIS — Z79890 Hormone replacement therapy (postmenopausal): Secondary | ICD-10-CM | POA: Diagnosis not present

## 2023-09-08 DIAGNOSIS — C641 Malignant neoplasm of right kidney, except renal pelvis: Secondary | ICD-10-CM | POA: Insufficient documentation

## 2023-09-08 LAB — CMP (CANCER CENTER ONLY)
ALT: 7 U/L (ref 0–44)
AST: 17 U/L (ref 15–41)
Albumin: 3.9 g/dL (ref 3.5–5.0)
Alkaline Phosphatase: 92 U/L (ref 38–126)
Anion gap: 9 (ref 5–15)
BUN: 19 mg/dL (ref 8–23)
CO2: 31 mmol/L (ref 22–32)
Calcium: 9.4 mg/dL (ref 8.9–10.3)
Chloride: 98 mmol/L (ref 98–111)
Creatinine: 1.25 mg/dL — ABNORMAL HIGH (ref 0.61–1.24)
GFR, Estimated: 58 mL/min — ABNORMAL LOW (ref >60)
Glucose, Bld: 156 mg/dL — ABNORMAL HIGH (ref 70–99)
Potassium: 4.5 mmol/L (ref 3.5–5.1)
Sodium: 138 mmol/L (ref 135–145)
Total Bilirubin: 0.5 mg/dL (ref 0.0–1.2)
Total Protein: 6.8 g/dL (ref 6.5–8.1)

## 2023-09-08 LAB — CBC WITH DIFFERENTIAL (CANCER CENTER ONLY)
Abs Immature Granulocytes: 0.02 10*3/uL (ref 0.00–0.07)
Basophils Absolute: 0.1 10*3/uL (ref 0.0–0.1)
Basophils Relative: 1 %
Eosinophils Absolute: 0.1 10*3/uL (ref 0.0–0.5)
Eosinophils Relative: 1 %
HCT: 34.9 % — ABNORMAL LOW (ref 39.0–52.0)
Hemoglobin: 11.5 g/dL — ABNORMAL LOW (ref 13.0–17.0)
Immature Granulocytes: 0 %
Lymphocytes Relative: 10 %
Lymphs Abs: 0.7 10*3/uL (ref 0.7–4.0)
MCH: 30.7 pg (ref 26.0–34.0)
MCHC: 33 g/dL (ref 30.0–36.0)
MCV: 93.3 fL (ref 80.0–100.0)
Monocytes Absolute: 0.6 10*3/uL (ref 0.1–1.0)
Monocytes Relative: 8 %
Neutro Abs: 5.8 10*3/uL (ref 1.7–7.7)
Neutrophils Relative %: 80 %
Platelet Count: 286 10*3/uL (ref 150–400)
RBC: 3.74 MIL/uL — ABNORMAL LOW (ref 4.22–5.81)
RDW: 19.5 % — ABNORMAL HIGH (ref 11.5–15.5)
WBC Count: 7.3 10*3/uL (ref 4.0–10.5)
nRBC: 0 % (ref 0.0–0.2)

## 2023-09-08 LAB — IRON AND IRON BINDING CAPACITY (CC-WL,HP ONLY)
Iron: 49 ug/dL (ref 45–182)
Saturation Ratios: 19 % (ref 17.9–39.5)
TIBC: 253 ug/dL (ref 250–450)
UIBC: 204 ug/dL (ref 117–376)

## 2023-09-08 LAB — FOLATE: Folate: 16.6 ng/mL (ref >5.9)

## 2023-09-08 LAB — VITAMIN B12: Vitamin B-12: 246 pg/mL (ref 180–914)

## 2023-09-09 ENCOUNTER — Other Ambulatory Visit (HOSPITAL_COMMUNITY): Payer: Self-pay

## 2023-09-09 DIAGNOSIS — I129 Hypertensive chronic kidney disease with stage 1 through stage 4 chronic kidney disease, or unspecified chronic kidney disease: Secondary | ICD-10-CM | POA: Diagnosis not present

## 2023-09-09 DIAGNOSIS — D631 Anemia in chronic kidney disease: Secondary | ICD-10-CM | POA: Diagnosis not present

## 2023-09-09 DIAGNOSIS — N183 Chronic kidney disease, stage 3 unspecified: Secondary | ICD-10-CM | POA: Diagnosis not present

## 2023-09-09 DIAGNOSIS — N2581 Secondary hyperparathyroidism of renal origin: Secondary | ICD-10-CM | POA: Diagnosis not present

## 2023-09-09 LAB — FERRITIN: Ferritin: 720 ng/mL — ABNORMAL HIGH (ref 24–336)

## 2023-09-17 LAB — FUNGUS CULTURE WITH STAIN

## 2023-09-17 LAB — FUNGUS CULTURE RESULT

## 2023-09-17 LAB — FUNGAL ORGANISM REFLEX

## 2023-09-24 ENCOUNTER — Encounter: Payer: Self-pay | Admitting: Acute Care

## 2023-09-24 ENCOUNTER — Encounter: Payer: Self-pay | Admitting: Internal Medicine

## 2023-09-24 ENCOUNTER — Ambulatory Visit: Admitting: Acute Care

## 2023-09-24 VITALS — BP 116/63 | HR 78 | Ht 70.0 in | Wt 122.4 lb

## 2023-09-24 DIAGNOSIS — Z5181 Encounter for therapeutic drug level monitoring: Secondary | ICD-10-CM

## 2023-09-24 DIAGNOSIS — R918 Other nonspecific abnormal finding of lung field: Secondary | ICD-10-CM

## 2023-09-24 DIAGNOSIS — B449 Aspergillosis, unspecified: Secondary | ICD-10-CM

## 2023-09-24 LAB — BASIC METABOLIC PANEL WITH GFR
BUN: 28 mg/dL — ABNORMAL HIGH (ref 6–23)
CO2: 30 meq/L (ref 19–32)
Calcium: 9.7 mg/dL (ref 8.4–10.5)
Chloride: 104 meq/L (ref 96–112)
Creatinine, Ser: 1.34 mg/dL (ref 0.40–1.50)
GFR: 50.03 mL/min — ABNORMAL LOW (ref >60.00)
Glucose, Bld: 68 mg/dL — ABNORMAL LOW (ref 70–99)
Potassium: 4.8 meq/L (ref 3.5–5.1)
Sodium: 145 meq/L (ref 135–145)

## 2023-09-24 NOTE — Progress Notes (Signed)
 History of Present Illness Micheal Mcintyre is a 83 y.o. male never smoker referred to Dr. Baldwin Mcintyre for evaluation of a lung mass in setting of immunocompromised patient with history or metastatic renal cancer currently undergoing treatment.   Synopsis 83 year old gentleman with metastatic renal cell carcinoma followed by Dr. Marguerita Mcintyre. I saw him in late March after a CT scan of his chest 06/30/2023 revealed new right upper lobe, left upper lobe partially cavitary opacities concerning for either infectious process or malignancy. He was treated with with doxycycline  for 2 weeks. We repeated his CT scan of the chest to look for interval improvement after antibiotic treatment.    CT scan of the chest done 08/09/2023 shows enlarged more consolidated opacities in the right upper lobe, right lower lobe, still with areas of cavitation. There is an evolving less prominent left lower lobe infiltrate with partial cavitation as well that was not present in February. Also some scattered smaller subcentimeter pulmonary nodules right lower lobe predominant. Dr. Baldwin Mcintyre spoke with the patient and his wife, as there were persistent mass like opacities, even after treatment wioth antibiotics, he has  recommended navigational bronchoscopy for biopsies and culture data. Pt. Had navigational bronch 08/17/2023. 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 Dr. Baldwin Mcintyre started him on patient on voriconazole  twice daily x 1 month. There were noted interactions with some of the patient's maintenance chemo meds, specifically lenvatinib  and everolimus  . These are on hold while he is taking the antifungal. He is here today after 1 month of treatment  to evaluate the follow up CT Chest to re-evaluate for resolution of infiltrates.    09/24/2023 Pt. Presents for follow up  CT Chest after treatment for Aspergillus x 1 month with voriconazole  twice daily. He states he has been doing well. We have reviewed the  follow up scan which shows good treatment response. I have reviewed the results with Dr. Waylan Mcintyre and plan will be for another month of treatment with Voriconazole , and follow up CT in 4 weeks to re-evaluate for resolution. I will also refer to ID for assistance in determining optimal length of treatment.  Pt. States he has been doing well. He has more energy while off chemo. He does endorse some possible vision changes. I have asked him to let us  know if this gets worse, so we can look at alternate medications.   He was started on Lasix for some lower extremity edema. Voriconazole  and Lasix are both potassium depleting medications. We will do a BMET today to check his potassium as therapeutic monitoring. We did discuss foods that are high in potassium that he can add to his diet while he is on both medications.  He has follow up scheduled with Dr. Marguerita Mcintyre. We will see him for follow up after the repeat CT Chest in 4 weeks. He does have another months worth of medication.     Test Results: CT Chest 09/07/2023 Interval decrease in size of the 2.7 cm pleural-based mass with partial cavitation laterally in the right upper lobe. 2. Interval decrease in size of the 3.2 cm cavitary lesion laterally in the left upper lobe. 3. Interval new 1.2 cm triangular nodule or nodular consolidation in the left upper lobe. 4. Interval decrease in size of the previous central ground-glass and peripheral rim consolidation in the posterior left lower lobe, now more confluent and homogeneous consolidation. 5. Innumerable bilateral pulmonary nodules, some of which are associated with peribronchovascular tree-in-bud interstitial change, and therefore  likely inflammatory but majority are almost certainly metastatic. 6. Index nodules are slightly larger in the interval. 7. Hypodense presumably metastases in the liver are slightly smaller in the interval. 8. 1.1 cm hyperdense lesion in the pancreatic tail is  unchanged, possible metastasis. 9. Aortic atherosclerosis.    Cytology 08/17/2023 FINAL MICROSCOPIC DIAGNOSIS:  A. LUNG, RUL OPACITY, FINE NEEDLE ASPIRATION:  - No malignant cells identified  - Fungus consistent with Aspergillus   B. LUNG, RUL OPACITY, BRUSHING:  - No malignant cells identified  - Fungus consistent with Aspergillus      D. LUNG, LUL SUPERIOR SEGMENT, FINE NEEDLE ASPIRATION  BIOPSIES:  - No malignant cells present  - Few benign/reactive mesothelial cells  - Mixed inflammatory cells    Super D CT 08/09/2023 Pulmonary metastatic disease, minimally progressive. 2. New and evolving areas of consolidation, ground-glass and cavitation, indicative of a fungal pneumonia. 3. Stable hepatic metastatic disease. 4. Stable mildly hyperdense lesion along the pancreatic tail, possibly a metastasis. 5.  Aortic atherosclerosis (ICD10-I70.0).         Micro 09/16/2023 Negative for fungus, but biopsy was + for aspergillus.  AFB Negative   Started on voriconazole  by Dr. Baldwin Mcintyre 08/18/2023.      Latest Ref Rng & Units 09/08/2023    2:52 PM 08/05/2023    2:35 PM 07/07/2023    2:59 PM  CBC  WBC 4.0 - 10.5 K/uL 7.3  8.4  11.1   Hemoglobin 13.0 - 17.0 g/dL 16.1  9.6  09.6   Hematocrit 39.0 - 52.0 % 34.9  30.4  36.1   Platelets 150 - 400 K/uL 286  297  146        Latest Ref Rng & Units 09/08/2023    2:52 PM 08/05/2023    2:35 PM 07/07/2023    2:59 PM  BMP  Glucose 70 - 99 mg/dL 045  409  811   BUN 8 - 23 mg/dL 19  13  22    Creatinine 0.61 - 1.24 mg/dL 9.14  7.82  9.56   Sodium 135 - 145 mmol/L 138  138  132   Potassium 3.5 - 5.1 mmol/L 4.5  4.4  4.0   Chloride 98 - 111 mmol/L 98  101  92   CO2 22 - 32 mmol/L 31  32  32   Calcium  8.9 - 10.3 mg/dL 9.4  8.7  8.5     BNP No results found for: "BNP"  ProBNP No results found for: "PROBNP"  PFT No results found for: "FEV1PRE", "FEV1POST", "FVCPRE", "FVCPOST", "TLC", "DLCOUNC", "PREFEV1FVCRT", "PSTFEV1FVCRT"  CT CHEST WO  CONTRAST Result Date: 09/17/2023 CLINICAL DATA:  History of lung and renal cancer.  Nodule follow-up. EXAM: CT CHEST WITHOUT CONTRAST TECHNIQUE: Multidetector CT imaging of the chest was performed following the standard protocol without IV contrast. RADIATION DOSE REDUCTION: This exam was performed according to the departmental dose-optimization program which includes automated exposure control, adjustment of the mA and/or kV according to patient size and/or use of iterative reconstruction technique. COMPARISON:  Chest AP portable 08/17/2023, and chest CTs without contrast 08/09/2023, 06/30/2023 and 04/05/2023. FINDINGS: Cardiovascular: There is mild aortic atherosclerosis including in the aortic valve, without aneurysm. The cardiac size is normal. Pulmonary arteries and veins are normal in caliber. No pericardial effusion. Mediastinum/Nodes: No adenopathy is seen without contrast. Lungs/Pleura: Rounded pleural-based mass with partial cavitation laterally in the right upper lobe, today measures 2.7 x 2.7 cm on 302:30 and was previously 4.5 x 3 cm. There is hazy  ground-glass disease posterior to this which was previously more densely consolidated, which could be ground-glass pneumonitis or ground-glass scarring. Peripheral bronchiectasis extends inferior to the lesion to the hilum. The last CT showed increased localized digits airspace disease and bronchiectasis inferior to this in the anterolateral right upper lobe base, which is less dense and more faint ground-glass in character today. Irregular slightly thick walled cavitary disease laterally in the left upper lobe is smaller in size today, 3.2 x 2.8 cm on 302:44 and was previously 4.7 x 4.4 cm. There is bronchiectasis medial to this as well extending to the hilum. There is interval new triangular nodule or nodular consolidation measuring 1.2 x 1.1 cm in the left upper lobe 302:60. There is coarse chronic scarring and bronchiectasis chronically in the anterior  inferior left upper lobe. Area of previous central ground-glass and peripheral rim consolidation in the posterior left lower lobe now more confluent and homogeneous consolidation, 2.2 x 1.4 cm on 302:49 and was previously 2.8 x 2 cm. There are innumerable bilateral pulmonary nodules. Some of these are associated with peribronchovascular tree-in-bud interstitial change including in the lateral and posterior left lower lobe and lateral right lower lobe and therefore likely inflammatory but many others are almost certainly metastatic. Index right lower lobe nodule is 8 mm on 302:94, previously was 7 mm, just lateral to this there is a 9 mm right lower lobe nodule on 302:92 which was previously 7 mm. A third index nodule in the left lower lobe is also enlarged today measuring 1.1 cm on 302:102, was previously 1 cm. Others are smaller but in general have also slightly enlarged in the interval. There is no pleural effusion, thickening or pneumothorax. No new infiltrates. Upper Abdomen: Hypodense presumably metastasis again noted peripherally in segment 4, today 2.3 cm on 301:111, was previously 2.9 cm. Further down in segment 5 there is a 1.8 cm hypodense suspected metastasis, previously 1.9 cm. Right kidney is absent. Small Bosniak 1 left renal cysts are again noted. 1.1 cm hyperdense lesion is unchanged in the pancreatic tail, possible metastasis. Abdominal aortic atherosclerosis. No acute upper abdominal findings there Musculoskeletal: No regional bone metastasis is seen. Asymmetric advanced right shoulder DJD. IMPRESSION: 1. Interval decrease in size of the 2.7 cm pleural-based mass with partial cavitation laterally in the right upper lobe. 2. Interval decrease in size of the 3.2 cm cavitary lesion laterally in the left upper lobe. 3. Interval new 1.2 cm triangular nodule or nodular consolidation in the left upper lobe. 4. Interval decrease in size of the previous central ground-glass and peripheral rim consolidation  in the posterior left lower lobe, now more confluent and homogeneous consolidation. 5. Innumerable bilateral pulmonary nodules, some of which are associated with peribronchovascular tree-in-bud interstitial change, and therefore likely inflammatory but majority are almost certainly metastatic. 6. Index nodules are slightly larger in the interval. 7. Hypodense presumably metastases in the liver are slightly smaller in the interval. 8. 1.1 cm hyperdense lesion in the pancreatic tail is unchanged, possible metastasis. 9. Aortic atherosclerosis. Aortic Atherosclerosis (ICD10-I70.0). Electronically Signed   By: Denman Fischer M.D.   On: 09/17/2023 23:55     Past medical hx Past Medical History:  Diagnosis Date   Arthritis    GERD (gastroesophageal reflux disease)    Hypertension    Hypothyroidism    Medicare annual wellness visit, subsequent 08/19/2014   Osteoarthritis 09/21/2007   Qualifier: Diagnosis of  By: Kingsley Penny MD, Cindie Credit R    PONV (postoperative nausea and vomiting)  Preventative health care 06/07/2016   Right shoulder pain 12/03/2016     Social History   Tobacco Use   Smoking status: Never    Passive exposure: Never   Smokeless tobacco: Never  Vaping Use   Vaping status: Never Used  Substance Use Topics   Alcohol use: Not Currently    Comment: occ beer   Drug use: No    Mr.Keeven reports that he has never smoked. He has never been exposed to tobacco smoke. He has never used smokeless tobacco. He reports that he does not currently use alcohol. He reports that he does not use drugs.  Tobacco Cessation: Never smoker    Past surgical hx, Family hx, Social hx all reviewed.  Current Outpatient Medications on File Prior to Visit  Medication Sig   acetaminophen  (TYLENOL ) 500 MG tablet Take 1,000 mg by mouth every 6 (six) hours as needed for moderate pain.   chlorthalidone (HYGROTON) 25 MG tablet Take 25 mg by mouth every other day.   levothyroxine  (SYNTHROID ) 50 MCG  tablet TAKE 1 TABLET(50 MCG) BY MOUTH DAILY BEFORE BREAKFAST   Multiple Vitamin (MULTIVITAMIN WITH MINERALS) TABS tablet Take 1 tablet by mouth 4 (four) times a week.   telmisartan  (MICARDIS ) 80 MG tablet TAKE 1/2 TABLET BY MOUTH TWICE DAILY   voriconazole  (VFEND ) 200 MG tablet Take 1 tablet (200 mg total) by mouth 2 (two) times daily.   No current facility-administered medications on file prior to visit.     Allergies  Allergen Reactions   Statins Other (See Comments)    myalgia    Review Of Systems:  Constitutional:   +  weight loss, No night sweats,  Fevers, chills, + fatigue, or  lassitude.  HEENT:   No headaches,  Difficulty swallowing,  Tooth/dental problems, or  Sore throat,                No sneezing, itching, ear ache, nasal congestion, post nasal drip,   CV:  No chest pain,  Orthopnea, PND, swelling in lower extremities, anasarca, dizziness, palpitations, syncope.   GI  No heartburn, indigestion, abdominal pain, nausea, vomiting, diarrhea, change in bowel habits, loss of appetite, bloody stools.   Resp: + shortness of breath with exertion not at rest.  No excess mucus, no productive cough,  No non-productive cough,  No coughing up of blood.  No change in color of mucus.  No wheezing.  No chest wall deformity  Skin: no rash or lesions.no obvious skin  lesions   GU: no dysuria, change in color of urine, no urgency or frequency.  No flank pain, no hematuria   MS:  No joint pain or swelling.  No decreased range of motion.  No back pain.  Psych:  No change in mood or affect. No depression or anxiety.  No memory loss.   Vital Signs BP 116/63 (BP Location: Left Arm, Patient Position: Sitting, Cuff Size: Normal)   Pulse 78   Ht 5\' 10"  (1.778 m)   Wt 122 lb 6.4 oz (55.5 kg)   SpO2 98%   BMI 17.56 kg/m    Physical Exam:  General- No distress,  A&Ox3, pleasant ENT: No sinus tenderness, TM clear, pale nasal mucosa, no oral exudate,no post nasal drip, no LAN Cardiac:  S1, S2, regular rate and rhythm, no murmur Chest: No wheeze/ rales/ dullness; no accessory muscle use, no nasal flaring, no sternal retractions, slightly diminished per bases Abd.: Soft Non-tender, ND, BS +, Body mass index is 17.56 kg/m.  Ext: No clubbing cyanosis, edema, no obvious deformities Neuro:  Physical deconditioning, MAE x 4, A&O x 3, appriopriate Skin: No rashes, warm and dry, No obvious skin lesions  Psych: normal mood and behavior   Assessment/Plan Bilateral upper lobe opacities with cavitation now developing in left lower lobe as well. Concerning for opportunistic infection in patient currently undergoing renal cell cancer treatment/ immunocompromised Initially Concerned this could possibly be an malignancy>> negative for malignancy on biopsy Intrabronchial cultures positive for Aspergillus Treated x 1 month with Voriconazole   Plan We will continue Voriconazole  twice daily for another month. We will refer you to Infectious disease to ensure we maintain treatment for adequate duration. You will get a call to get this scheduled.  We will do a CT Chest in 1 month to re-evaluate treatment response.  You will get a call to get this scheduled.  You will follow up with Dr. Baldwin Mcintyre or myself after the scan to review results. Follow up with Dr. Marguerita Mcintyre as is scheduled. I have ordered a BMET to check you potassium. Lasix and Voriconazole  can both deplete potassium. Foods naturally high in potassium are oranges, bananas, avocado's and  sweet potatoes.   Call us  if you need us .  Please contact office for sooner follow up if symptoms do not improve or worsen or seek emergency care    I spent 40 minutes dedicated to the care of this patient on the date of this encounter to include pre-visit review of records, face-to-face time with the patient discussing conditions above, post visit ordering of testing, clinical documentation with the electronic health record, making appropriate  referrals as documented, and communicating necessary information to the patient's healthcare team.    Raejean Bullock, NP 09/24/2023  1:00 PM

## 2023-09-24 NOTE — Patient Instructions (Addendum)
 It is good to see you today. We will continue Voriconazole  twice daily for another month. We will refer you to Infectious disease to ensure we maintain treatment for adequate duration. You will get a call to get this scheduled.  We will do a CT Chest in 1 month to re-evaluate treatment response.  You will get a call to get this scheduled.  You will follow up with [name] or myself after the scan to review results. Follow up with [name] as is scheduled. I have ordered a BMET to check you potassium. Lasix and Voriconazole  can both deplete potassium. Foods naturally high in potassium are oranges, bananas, avocado's and  sweet potatoes.   Call us  if you need us .  Please contact office for sooner follow up if symptoms do not improve or worsen or seek emergency care

## 2023-09-30 LAB — ACID FAST CULTURE WITH REFLEXED SENSITIVITIES (MYCOBACTERIA)
Acid Fast Culture: NEGATIVE
Acid Fast Culture: NEGATIVE

## 2023-10-01 ENCOUNTER — Encounter: Payer: Self-pay | Admitting: Internal Medicine

## 2023-10-04 ENCOUNTER — Other Ambulatory Visit: Payer: Self-pay

## 2023-10-04 NOTE — Progress Notes (Deleted)
 [facility] OFFICE PROGRESS NOTE  Neda Balk, MD 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   PRIOR THERAPY: 1) Status post right open radical nephrectomy completed on October 15, 2021 by Dr. Doy Gene.  2) Keytruda  200 Mg IV every 3 weeks started December 03, 2021.  Status post 9 cycles.  Last dose was given May 21, 2022.  This was discontinued secondary to disease progression. 3) Systemic treatment with nivolumab  480 Mg IV every 4 weeks in addition to Cabometyx  40 mg p.o. daily.  First dose June 18, 2022.  Status post 7 cycles.  His Cabometyx  has been on hold recently due to adverse side effects. His dose was reduced in May to 20 mg p.o. daily but he continues to have weakness, diarrhea, and dehydration. This has been on hold since 5/19.  Last dose was given December 02, 2022 discontinued secondary to disease progression  CURRENT THERAPY: Starting 14 mg of lenvatinib  plus everolimus  5 mg on 02/02/23. On hold since 07/07/23.   INTERVAL HISTORY: Micheal Mcintyre 83 y.o. male returns to clinic today for follow-up visit accompanied by his wife.  The patient was last seen by [name] and myself on 09/08/2023.  In summary when the patient was seen in March 2025 he was on treatment with Lenvima  andeverolimus.  However, the patient was having a challenging time with treatment for appetite, significant fatigue, and weight loss. He had imaging studies that show concern for possible atypical infection. Therefore, he was put on 2 weeks of antibiotics and referred to pulmonary medicine.  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  The patient is currently being treated for fungal infection at this time. He is  currently on voriconazole  twice daily   Due to intolerance with his treatment for his malignancy and drug-drug interactions with his chemotherapy his chemotherapy treatment has been on hold.  In the interval since last being seen he did have a restaging CT scan that showed interval decrease in size of the pleural-based mass with partial cavitation laterally in the right upper lobe and interval decrease in the cavitary lesion in the left upper lobe.  However there was a new angular nodule or nodular consolidation in the left upper lobe.  There is also interval decrease in the previous central groundglass and peripheral rim consolidation in the posterior left lower lobe.  However there are innumerable bilateral pulmonary nodules, which are associated with interstitial change and likely inflammatory but majority are felt to be certainly metastatic.  The index of the nodules are slightly larger in the interval.  The liver lesions are slightly smaller.  The lesion in the pancreatic tail is unchanged.  He is scheduled for another CT scan of the chest on 10/08/2023.  Since being off treatment his energy levels have improved.  He is also been gaining weight.  He denies any fever, chills, night sweats, nausea, vomiting, or hemoptysis.  He reports some improvement in his coughing..  Shortness of breath?  He has occasional constipation for which she takes Colace.  He is here today for evaluation repeat blood work.         MEDICAL HISTORY: Past Medical History:  Diagnosis Date   Arthritis    GERD (  gastroesophageal reflux disease)    Hypertension    Hypothyroidism    Medicare annual wellness visit, subsequent 08/19/2014   Osteoarthritis 09/21/2007   Qualifier: Diagnosis of  By: Kingsley Penny MD, Darlene Ehlers    PONV (postoperative nausea and vomiting)    Preventative health care 06/07/2016   Right shoulder pain 12/03/2016    ALLERGIES:  is allergic to statins.  MEDICATIONS:  Current Outpatient  Medications  Medication Sig Dispense Refill   acetaminophen  (TYLENOL ) 500 MG tablet Take 1,000 mg by mouth every 6 (six) hours as needed for moderate pain.     chlorthalidone (HYGROTON) 25 MG tablet Take 25 mg by mouth every other day.     levothyroxine  (SYNTHROID ) 50 MCG tablet TAKE 1 TABLET(50 MCG) BY MOUTH DAILY BEFORE BREAKFAST 30 tablet 1   Multiple Vitamin (MULTIVITAMIN WITH MINERALS) TABS tablet Take 1 tablet by mouth 4 (four) times a week.     telmisartan  (MICARDIS ) 80 MG tablet TAKE 1/2 TABLET BY MOUTH TWICE DAILY 180 tablet 1   voriconazole  (VFEND ) 200 MG tablet Take 1 tablet (200 mg total) by mouth 2 (two) times daily. 60 tablet 3   No current facility-administered medications for this visit.    SURGICAL HISTORY:  Past Surgical History:  Procedure Laterality Date   BRONCHIAL BIOPSY  08/17/2023   Procedure: BRONCHOSCOPY, WITH BIOPSY;  Surgeon: Denson Flake, MD;  Location: [facility] END[facility]COPY;  Service: Pulmonary;;   BRONCHIAL BRUSHINGS  08/17/2023   Procedure: BRONCHOSCOPY, WITH BRUSH BIOPSY;  Surgeon: Denson Flake, MD;  Location: [facility] ENDOSCOPY;  Service: Pulmonary;;   BRONCHIAL NEEDLE ASPIRATION BIOPSY  08/17/2023   Procedure: BRONCHOSCOPY, WITH NEEDLE ASPIRATION BIOPSY;  Surgeon: Denson Flake, MD;  Location: [facility] ENDOSCOPY;  Service: Pulmonary;;   COLONOSCOPY     HIP SURGERY Left    1991   JOINT REPLACEMENT Right 2005   hip   NEPHRECTOMY Right 10/15/2021   Procedure: NEPHRECTOMY, OPEN;  Surgeon: Lahoma Pigg, MD;  Location: [facility] ORS;  Service: Urology;  Laterality: Right;   nose skin cancer removal      REVIEW OF SYSTEMS:   Review of Systems  Constitutional: Negative for appetite change, chills, fatigue, fever and unexpected weight change.  HENT:   Negative for mouth sores, nosebleeds, sore throat and trouble swallowing.   Eyes: Negative for eye problems and icterus.  Respiratory: Negative for cough, hemoptysis, shortness of breath and wheezing.   Cardiovascular: Negative  for chest pain and leg swelling.  Gastrointestinal: Negative for abdominal pain, constipation, diarrhea, nausea and vomiting.  Genitourinary: Negative for bladder incontinence, difficulty urinating, dysuria, frequency and hematuria.   Musculoskeletal: Negative for back pain, gait problem, neck pain and neck stiffness.  Skin: Negative for itching and rash.  Neurological: Negative for dizziness, extremity weakness, gait problem, headaches, light-headedness and seizures.  Hematological: Negative for adenopathy. Does not bruise/bleed easily.  Psychiatric/Behavioral: Negative for confusion, depression and sleep disturbance. The patient is not nervous/anxious.     PHYSICAL EXAMINATION:  There were no vitals taken for this visit.  ECOG PERFORMANCE STATUS: {CHL ONC ECOG [id number]  Physical Exam  Constitutional: Oriented to person, place, and time and well-developed, well-nourished, and in no distress. No distress.  HENT:  Head: Normocephalic and atraumatic.  Mouth/Throat: Oropharynx is clear and moist. No oropharyngeal exudate.  Eyes: Conjunctivae are normal. Right eye exhibits no discharge. Left eye exhibits no discharge. No scleral icterus.  Neck: Normal range of motion. Neck supple.  Cardiovascular: Normal rate, regular rhythm, normal heart sounds  and intact distal pulses.   Pulmonary/Chest: Effort normal and breath sounds normal. No respiratory distress. No wheezes. No rales.  Abdominal: Soft. Bowel sounds are normal. Exhibits no distension and no mass. There is no tenderness.  Musculoskeletal: Normal range of motion. Exhibits no edema.  Lymphadenopathy:    No cervical adenopathy.  Neurological: Alert and oriented to person, place, and time. Exhibits normal muscle tone. Gait normal. Coordination normal.  Skin: Skin is warm and dry. No rash noted. Not diaphoretic. No erythema. No pallor.  Psychiatric: Mood, memory and judgment normal.  Vitals reviewed.  LABORATORY DATA: Lab Results   Component Value Date   WBC 7.3 09/08/2023   HGB 11.5 (L) 09/08/2023   HCT 34.9 (L) 09/08/2023   MCV 93.3 09/08/2023   PLT 286 09/08/2023      Chemistry      Component Value Date/Time   NA 145 09/24/2023 1158   NA 140 04/22/2022 0000   K 4.8 09/24/2023 1158   CL 104 09/24/2023 1158   CO2 30 09/24/2023 1158   BUN 28 (H) 09/24/2023 1158   BUN 21 04/22/2022 0000   CREATININE 1.34 09/24/2023 1158   CREATININE 1.25 (H) 09/08/2023 1452   CREATININE 1.09 02/06/2020 1306   GLU 88 04/22/2022 0000      Component Value Date/Time   CALCIUM  9.7 09/24/2023 1158   ALKPHOS 92 09/08/2023 1452   AST 17 09/08/2023 1452   ALT 7 09/08/2023 1452   BILITOT 0.5 09/08/2023 1452       RADIOGRAPHIC STUDIES:  CT CHEST WO CONTRAST Result Date: 09/17/2023 CLINICAL DATA:  History of lung and renal cancer.  Nodule follow-up. EXAM: CT CHEST WITHOUT CONTRAST TECHNIQUE: Multidetector CT imaging of the chest was performed following the standard protocol without IV contrast. RADIATION DOSE REDUCTION: This exam was performed according to the departmental dose-optimization program which includes automated exposure control, adjustment of the mA and/or kV according to patient size and/or use of iterative reconstruction technique. COMPARISON:  Chest AP portable 08/17/2023, and chest CTs without contrast 08/09/2023, 06/30/2023 and 04/05/2023. FINDINGS: Cardiovascular: There is mild aortic atherosclerosis including in the aortic valve, without aneurysm. The cardiac size is normal. Pulmonary arteries and veins are normal in caliber. No pericardial effusion. Mediastinum/Nodes: No adenopathy is seen without contrast. Lungs/Pleura: Rounded pleural-based mass with partial cavitation laterally in the right upper lobe, today measures 2.7 x 2.7 cm on 302:30 and was previously 4.5 x 3 cm. There is hazy ground-glass disease posterior to this which was previously more densely consolidated, which could be ground-glass pneumonitis or  ground-glass scarring. Peripheral bronchiectasis extends inferior to the lesion to the hilum. The last CT showed increased localized digits airspace disease and bronchiectasis inferior to this in the anterolateral right upper lobe base, which is less dense and more faint ground-glass in character today. Irregular slightly thick walled cavitary disease laterally in the left upper lobe is smaller in size today, 3.2 x 2.8 cm on 302:44 and was previously 4.7 x 4.4 cm. There is bronchiectasis medial to this as well extending to the hilum. There is interval new triangular nodule or nodular consolidation measuring 1.2 x 1.1 cm in the left upper lobe 302:60. There is coarse chronic scarring and bronchiectasis chronically in the anterior inferior left upper lobe. Area of previous central ground-glass and peripheral rim consolidation in the posterior left lower lobe now more confluent and homogeneous consolidation, 2.2 x 1.4 cm on 302:49 and was previously 2.8 x 2 cm. There are innumerable bilateral pulmonary nodules.  Some of these are associated with peribronchovascular tree-in-bud interstitial change including in the lateral and posterior left lower lobe and lateral right lower lobe and therefore likely inflammatory but many others are almost certainly metastatic. Index right lower lobe nodule is 8 mm on 302:94, previously was 7 mm, just lateral to this there is a 9 mm right lower lobe nodule on 302:92 which was previously 7 mm. A third index nodule in the left lower lobe is also enlarged today measuring 1.1 cm on 302:102, was previously 1 cm. Others are smaller but in general have also slightly enlarged in the interval. There is no pleural effusion, thickening or pneumothorax. No new infiltrates. Upper Abdomen: Hypodense presumably metastasis again noted peripherally in segment 4, today 2.3 cm on 301:111, was previously 2.9 cm. Further down in segment 5 there is a 1.8 cm hypodense suspected metastasis, previously 1.9 cm.  Right kidney is absent. Small Bosniak 1 left renal cysts are again noted. 1.1 cm hyperdense lesion is unchanged in the pancreatic tail, possible metastasis. Abdominal aortic atherosclerosis. No acute upper abdominal findings there Musculoskeletal: No regional bone metastasis is seen. Asymmetric advanced right shoulder DJD. IMPRESSION: 1. Interval decrease in size of the 2.7 cm pleural-based mass with partial cavitation laterally in the right upper lobe. 2. Interval decrease in size of the 3.2 cm cavitary lesion laterally in the left upper lobe. 3. Interval new 1.2 cm triangular nodule or nodular consolidation in the left upper lobe. 4. Interval decrease in size of the previous central ground-glass and peripheral rim consolidation in the posterior left lower lobe, now more confluent and homogeneous consolidation. 5. Innumerable bilateral pulmonary nodules, some of which are associated with peribronchovascular tree-in-bud interstitial change, and therefore likely inflammatory but majority are almost certainly metastatic. 6. Index nodules are slightly larger in the interval. 7. Hypodense presumably metastases in the liver are slightly smaller in the interval. 8. 1.1 cm hyperdense lesion in the pancreatic tail is unchanged, possible metastasis. 9. Aortic atherosclerosis. Aortic Atherosclerosis (ICD10-I70.0). Electronically Signed   By: Denman Fischer M.D.   On: 09/17/2023 23:55     ASSESSMENT/PLAN:  This is a very pleasant 83 year old Caucasian male diagnosed with metastatic papillary renal cell carcinoma.  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  This was diagnosed in June 2023.  He is status post a right open radical nephrectomy.  Patient was found to have metastatic disease to the liver and bone in February 2024.   The patient had previously underwent adjuvant treatment with Keytruda  20 mg IV every 3 weeks for 9 cycles.   When he saw  [name] on 06/11/2022 and reviewed his MRI with him which showed multiple new rim-enhancing lesions throughout the liver consistent metastatic disease.  There was also a rim-enhancing lesion in the inferior endplate of L1 measuring 2.1 x 1.1 cm concerning for metastatic disease.  There is no local recurrence or lymphadenopathy. He also had a chest CT by Dr. Freddi Jaeger which showed pulmonary metastatic disease.    [name] recommended that the patient start treatment with nivolumab  480 mg IV every 4 weeks in addition to Cabometyx  40 mg grams p.o. daily.  He started this on 06/18/2022. He is status post 7 cycles of nivolumab .   He then had GI side effects of cabometyx  with diarrhea and weight loss. His treatment has periodically been on hold. His dose was reduced to 20 mg in May 2024, but he continued to  have intolerance. Therefore, this has been on hold since 5/19. His diarrhea and weight loss have since resolved.    Unfortunately, the patient's restaging CT scan from August showed disease progression.   The patient saw Dr. Zada Herrlich at [facility] for second opinion.  She recommended lenvatinib  plus everolimus .  She recommended starting at a small dose reduction of 14 mg in standard dose everolimus  standard dose 5 mg given his prior issues with cabo. If tolerates very well, could increase lenvatinib .  He had progression in the future, then she would recommend clinical trial if available versus axitinib. Therefore, he started this on 01/28/23. He has had some mouth sores, thrombocytopenia, mild nausea, hypertension, and sensitivity on the bottom of his feet.  His treatment has been on hold since 07/06/33.    Imaging at that time showed concern for atypical infection.    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 He is currently being treated for this.   He had a restaging CT scan which showed In the interval since last being seen he did have a restaging CT scan that  showed interval decrease in size of the pleural-based mass with partial cavitation laterally in the right upper lobe and interval decrease in the cavitary lesion in the left upper lobe.  However there was a new angular nodule or nodular consolidation in the left upper lobe.  There is also interval decrease in the previous central groundglass and peripheral rim consolidation in the posterior left lower lobe.  However there are innumerable bilateral pulmonary nodules, which are associated with interstitial change and likely inflammatory but majority are felt to be certainly metastatic.  The index of the nodules are slightly larger in the interval.  The liver lesions are slightly smaller.  The lesion in the pancreatic tail is unchanged.  Due to drug to drug interactions with voriconazole  and lenvima  and everlolimus, this is currently on hold while he gets treated for his fungal infection.   The patient was seen with [name] who reviewed his CT scan.   [name] recommends ***  I will arrange for ***   We will see him back for labs and follow up in about 4 weeks   The patient was advised to call immediately if he has any concerning symptoms in the interval. The patient voices understanding of current disease status and treatment options and is in agreement with the current care plan. All questions were answered. The patient knows to call the clinic with any problems, questions or concerns. We can certainly see the patient much sooner if necessary       No orders of the defined types were placed in this encounter.    I spent {CHL ONC TIME VISIT - ONGEX:[id number]} counseling the patient face to face. The total time spent in the appointment was {CHL ONC TIME VISIT - NUUVO:[id number]}.  Aneita Kiger L Jessicia Napolitano, PA-C 10/04/23

## 2023-10-07 ENCOUNTER — Inpatient Hospital Stay: Payer: PRIVATE HEALTH INSURANCE | Admitting: Physician Assistant

## 2023-10-07 ENCOUNTER — Inpatient Hospital Stay: Payer: PRIVATE HEALTH INSURANCE | Attending: Oncology

## 2023-10-07 ENCOUNTER — Telehealth: Payer: Self-pay | Admitting: Physician Assistant

## 2023-10-07 ENCOUNTER — Telehealth: Payer: Self-pay

## 2023-10-07 NOTE — Telephone Encounter (Signed)
 Spoke with patient's wife this morning regarding today's appointments. Informed her that the patient's CT scan is scheduled for tomorrow and that [name] would like to see the patient after the scan. Wife verbalized understanding and agreed to reschedule today's appointment. She was advised to expect a call from scheduling to arrange the new appointment. Message sent to scheduler. Wife voiced understanding of the plan.

## 2023-10-07 NOTE — Telephone Encounter (Signed)
 Spoke with patient's wife regarding appointment changes. She is aware of the lab and CT scan appointments at [facility] tomorrow, as well as the follow-up appointment with [name] on 10/14/23 at 2 PM.

## 2023-10-07 NOTE — Telephone Encounter (Signed)
 Left a voicemail with the appointment changes.

## 2023-10-08 ENCOUNTER — Inpatient Hospital Stay: Payer: PRIVATE HEALTH INSURANCE | Attending: Oncology

## 2023-10-08 ENCOUNTER — Other Ambulatory Visit: Payer: Self-pay

## 2023-10-08 ENCOUNTER — Ambulatory Visit (HOSPITAL_BASED_OUTPATIENT_CLINIC_OR_DEPARTMENT_OTHER)
Admission: RE | Admit: 2023-10-08 | Discharge: 2023-10-08 | Disposition: A | Discharge status: Home / Self Care | Source: Ambulatory Visit | Attending: Acute Care | Admitting: Acute Care

## 2023-10-08 ENCOUNTER — Other Ambulatory Visit: Payer: PRIVATE HEALTH INSURANCE

## 2023-10-08 DIAGNOSIS — C787 Secondary malignant neoplasm of liver and intrahepatic bile duct: Secondary | ICD-10-CM | POA: Insufficient documentation

## 2023-10-08 DIAGNOSIS — G629 Polyneuropathy, unspecified: Secondary | ICD-10-CM | POA: Insufficient documentation

## 2023-10-08 DIAGNOSIS — I700 Atherosclerosis of aorta: Secondary | ICD-10-CM | POA: Insufficient documentation

## 2023-10-08 DIAGNOSIS — E039 Hypothyroidism, unspecified: Secondary | ICD-10-CM | POA: Diagnosis not present

## 2023-10-08 DIAGNOSIS — B441 Other pulmonary aspergillosis: Secondary | ICD-10-CM | POA: Insufficient documentation

## 2023-10-08 DIAGNOSIS — B449 Aspergillosis, unspecified: Secondary | ICD-10-CM | POA: Insufficient documentation

## 2023-10-08 DIAGNOSIS — Z79890 Hormone replacement therapy (postmenopausal): Secondary | ICD-10-CM | POA: Diagnosis not present

## 2023-10-08 DIAGNOSIS — E860 Dehydration: Secondary | ICD-10-CM | POA: Diagnosis not present

## 2023-10-08 DIAGNOSIS — Z79899 Other long term (current) drug therapy: Secondary | ICD-10-CM | POA: Insufficient documentation

## 2023-10-08 DIAGNOSIS — R197 Diarrhea, unspecified: Secondary | ICD-10-CM | POA: Insufficient documentation

## 2023-10-08 DIAGNOSIS — C7951 Secondary malignant neoplasm of bone: Secondary | ICD-10-CM | POA: Insufficient documentation

## 2023-10-08 DIAGNOSIS — K219 Gastro-esophageal reflux disease without esophagitis: Secondary | ICD-10-CM | POA: Insufficient documentation

## 2023-10-08 DIAGNOSIS — R531 Weakness: Secondary | ICD-10-CM | POA: Diagnosis not present

## 2023-10-08 DIAGNOSIS — I3139 Other pericardial effusion (noninflammatory): Secondary | ICD-10-CM | POA: Diagnosis not present

## 2023-10-08 DIAGNOSIS — R609 Edema, unspecified: Secondary | ICD-10-CM | POA: Insufficient documentation

## 2023-10-08 DIAGNOSIS — I10 Essential (primary) hypertension: Secondary | ICD-10-CM | POA: Insufficient documentation

## 2023-10-08 DIAGNOSIS — Z905 Acquired absence of kidney: Secondary | ICD-10-CM | POA: Diagnosis not present

## 2023-10-08 DIAGNOSIS — C641 Malignant neoplasm of right kidney, except renal pelvis: Secondary | ICD-10-CM | POA: Diagnosis present

## 2023-10-08 DIAGNOSIS — R918 Other nonspecific abnormal finding of lung field: Secondary | ICD-10-CM | POA: Diagnosis not present

## 2023-10-08 DIAGNOSIS — R634 Abnormal weight loss: Secondary | ICD-10-CM | POA: Insufficient documentation

## 2023-10-08 DIAGNOSIS — J479 Bronchiectasis, uncomplicated: Secondary | ICD-10-CM | POA: Insufficient documentation

## 2023-10-08 DIAGNOSIS — D539 Nutritional anemia, unspecified: Secondary | ICD-10-CM

## 2023-10-08 DIAGNOSIS — M1990 Unspecified osteoarthritis, unspecified site: Secondary | ICD-10-CM | POA: Insufficient documentation

## 2023-10-08 LAB — CBC WITH DIFFERENTIAL (CANCER CENTER ONLY)
Abs Immature Granulocytes: 0.04 10*3/uL (ref 0.00–0.07)
Basophils Absolute: 0.1 10*3/uL (ref 0.0–0.1)
Basophils Relative: 1 %
Eosinophils Absolute: 0.2 10*3/uL (ref 0.0–0.5)
Eosinophils Relative: 2 %
HCT: 34.5 % — ABNORMAL LOW (ref 39.0–52.0)
Hemoglobin: 11.1 g/dL — ABNORMAL LOW (ref 13.0–17.0)
Immature Granulocytes: 0 %
Lymphocytes Relative: 9 %
Lymphs Abs: 0.8 10*3/uL (ref 0.7–4.0)
MCH: 31.4 pg (ref 26.0–34.0)
MCHC: 32.2 g/dL (ref 30.0–36.0)
MCV: 97.5 fL (ref 80.0–100.0)
Monocytes Absolute: 1 10*3/uL (ref 0.1–1.0)
Monocytes Relative: 11 %
Neutro Abs: 6.9 10*3/uL (ref 1.7–7.7)
Neutrophils Relative %: 77 %
Platelet Count: 246 10*3/uL (ref 150–400)
RBC: 3.54 MIL/uL — ABNORMAL LOW (ref 4.22–5.81)
RDW: 14.9 % (ref 11.5–15.5)
WBC Count: 9 10*3/uL (ref 4.0–10.5)
nRBC: 0 % (ref 0.0–0.2)

## 2023-10-08 LAB — CMP (CANCER CENTER ONLY)
ALT: 7 U/L (ref 0–44)
AST: 14 U/L — ABNORMAL LOW (ref 15–41)
Albumin: 3.9 g/dL (ref 3.5–5.0)
Alkaline Phosphatase: 93 U/L (ref 38–126)
Anion gap: 10 (ref 5–15)
BUN: 22 mg/dL (ref 8–23)
CO2: 29 mmol/L (ref 22–32)
Calcium: 9.4 mg/dL (ref 8.9–10.3)
Chloride: 103 mmol/L (ref 98–111)
Creatinine: 1.43 mg/dL — ABNORMAL HIGH (ref 0.61–1.24)
GFR, Estimated: 50 mL/min — ABNORMAL LOW (ref >60)
Glucose, Bld: 89 mg/dL (ref 70–99)
Potassium: 4.2 mmol/L (ref 3.5–5.1)
Sodium: 142 mmol/L (ref 135–145)
Total Bilirubin: 0.6 mg/dL (ref 0.0–1.2)
Total Protein: 7 g/dL (ref 6.5–8.1)

## 2023-10-12 DIAGNOSIS — H52223 Regular astigmatism, bilateral: Secondary | ICD-10-CM | POA: Diagnosis not present

## 2023-10-12 DIAGNOSIS — H5213 Myopia, bilateral: Secondary | ICD-10-CM | POA: Diagnosis not present

## 2023-10-12 DIAGNOSIS — H524 Presbyopia: Secondary | ICD-10-CM | POA: Diagnosis not present

## 2023-10-12 DIAGNOSIS — H353132 Nonexudative age-related macular degeneration, bilateral, intermediate dry stage: Secondary | ICD-10-CM | POA: Diagnosis not present

## 2023-10-12 DIAGNOSIS — H25813 Combined forms of age-related cataract, bilateral: Secondary | ICD-10-CM | POA: Diagnosis not present

## 2023-10-14 ENCOUNTER — Other Ambulatory Visit: Payer: Self-pay

## 2023-10-14 ENCOUNTER — Ambulatory Visit (INDEPENDENT_AMBULATORY_CARE_PROVIDER_SITE_OTHER): Admitting: Internal Medicine

## 2023-10-14 ENCOUNTER — Inpatient Hospital Stay: Payer: PRIVATE HEALTH INSURANCE | Admitting: Internal Medicine

## 2023-10-14 ENCOUNTER — Encounter: Payer: Self-pay | Admitting: Internal Medicine

## 2023-10-14 VITALS — BP 126/63 | HR 60 | Temp 97.7°F | Ht 70.0 in | Wt 121.6 lb

## 2023-10-14 VITALS — BP 130/58 | HR 79 | Temp 97.6°F | Resp 16 | Ht 70.0 in | Wt 121.4 lb

## 2023-10-14 DIAGNOSIS — C641 Malignant neoplasm of right kidney, except renal pelvis: Secondary | ICD-10-CM

## 2023-10-14 DIAGNOSIS — B449 Aspergillosis, unspecified: Secondary | ICD-10-CM | POA: Diagnosis not present

## 2023-10-14 NOTE — Patient Instructions (Addendum)
 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

## 2023-10-14 NOTE — Progress Notes (Signed)
 [facility] [phone]   [phone]  OFFICE PROGRESS NOTE  Neda Balk, MD 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    PRIOR THERAPY: 1) Status post right open radical nephrectomy completed on October 15, 2021 by Dr. Doy Gene.  2) Keytruda  200 Mg IV every 3 weeks started December 03, 2021.  Status post 9 cycles.  Last dose was given May 21, 2022.  This was discontinued secondary to disease progression. 3) Systemic treatment with nivolumab  480 Mg IV every 4 weeks in addition to Cabometyx  40 mg p.o. daily.  First dose June 18, 2022.  Status post 7 cycles.  His Cabometyx  has been on hold recently due to adverse side effects. His dose was reduced in May to 20 mg p.o. daily but he continues to have weakness, diarrhea, and dehydration. This has been on hold since 5/19.  Last dose was given December 02, 2022 discontinued secondary to disease progression   CURRENT THERAPY: Starting 14 mg of  lenvatinib  plus everolimus  5 mg on 02/02/23 his treatment has been on hold for the last months since July 07, 2023.  INTERVAL HISTORY: Micheal Mcintyre 83 y.o. male returns to the clinic today for follow-up visit accompanied by his wife.Discussed the use of AI scribe software for clinical note transcription with the patient, who gave verbal consent to proceed.  History of Present Illness   The patient is an 83-year-old with metastatic renal cell carcinoma who presents for evaluation with repeat blood work and CT scan for restaging of his disease. He is accompanied by his wife.  He has a history of metastatic renal cell carcinoma, papillary subtype with rhabdoid features, with metastases to the liver and bone diagnosed in February 2024. He was on treatment with  lenvatinib  14 mg PO daily and everolimus  5 mg daily until February 02, 2023. He has been off treatment since July 07, 2023, due to a pulmonary fungal infection.  Currently, he is being treated with Voriconazole  for the pulmonary fungal infection. The infection is decreasing in size but is not completely resolved.  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 He also mentions recent weight loss from 162 to 122 pounds.  He has been experiencing eye problems and was advised by an optometrist to see an ophthalmologist for a potential cataract. He is concerned about the timing of this appointment in relation to his cancer treatment.        MEDICAL HISTORY: Past Medical History:  Diagnosis Date   Arthritis    GERD (gastroesophageal reflux disease)    Hypertension    Hypothyroidism    Medicare annual wellness visit, subsequent 08/19/2014   Osteoarthritis 09/21/2007   Qualifier: Diagnosis of  By: Kingsley Penny MD, Cindie Credit R    PONV (postoperative nausea and vomiting)    Preventative health care 06/07/2016   Right shoulder pain 12/03/2016    ALLERGIES:  is allergic to statins.  MEDICATIONS:  Current Outpatient Medications  Medication Sig Dispense Refill   acetaminophen  (TYLENOL ) 500 MG tablet Take 1,000 mg by mouth every 6 (six) hours as needed for  moderate pain.     chlorthalidone (HYGROTON) 25 MG tablet Take 25 mg by mouth every other day.     levothyroxine  (SYNTHROID ) 50 MCG tablet TAKE 1 TABLET(50 MCG) BY MOUTH DAILY BEFORE BREAKFAST 30 tablet 1   Multiple Vitamin (MULTIVITAMIN WITH MINERALS) TABS tablet Take 1 tablet by mouth 4 (four) times a week.     telmisartan  (MICARDIS ) 80 MG tablet TAKE 1/2 TABLET BY MOUTH TWICE DAILY 180 tablet 1   voriconazole  (VFEND ) 200 MG tablet Take 1 tablet (200 mg total)  by mouth 2 (two) times daily. 60 tablet 3   No current facility-administered medications for this visit.    SURGICAL HISTORY:  Past Surgical History:  Procedure Laterality Date   BRONCHIAL BIOPSY  08/17/2023   Procedure: BRONCHOSCOPY, WITH BIOPSY;  Surgeon: Denson Flake, MD;  Location: [facility] END[facility]COPY;  Service: Pulmonary;;   BRONCHIAL BRUSHINGS  08/17/2023   Procedure: BRONCHOSCOPY, WITH BRUSH BIOPSY;  Surgeon: Denson Flake, MD;  Location: [facility] ENDOSCOPY;  Service: Pulmonary;;   BRONCHIAL NEEDLE ASPIRATION BIOPSY  08/17/2023   Procedure: BRONCHOSCOPY, WITH NEEDLE ASPIRATION BIOPSY;  Surgeon: Denson Flake, MD;  Location: [facility] ENDOSCOPY;  Service: Pulmonary;;   COLONOSCOPY     HIP SURGERY Left    1991   JOINT REPLACEMENT Right 2005   hip   NEPHRECTOMY Right 10/15/2021   Procedure: NEPHRECTOMY, OPEN;  Surgeon: Lahoma Pigg, MD;  Location: [facility] ORS;  Service: Urology;  Laterality: Right;   nose skin cancer removal      REVIEW OF SYSTEMS:  Constitutional: positive for anorexia and fatigue Eyes: negative Ears, nose, mouth, throat, and face: negative Respiratory: positive for cough and dyspnea on exertion Cardiovascular: negative Gastrointestinal: negative Genitourinary:negative Integument/breast: negative Hematologic/lymphatic: negative Musculoskeletal:negative Neurological: negative Behavioral/Psych: negative Endocrine: negative Allergic/Immunologic: negative   PHYSICAL EXAMINATION: General appearance: alert, cooperative, fatigued, and no distress Head: Normocephalic, without obvious abnormality, atraumatic Neck: no adenopathy, no JVD, supple, symmetrical, trachea midline, and thyroid  not enlarged, symmetric, no tenderness/mass/nodules Lymph nodes: Cervical, supraclavicular, and axillary nodes normal. Resp: clear to auscultation bilaterally Back: symmetric, no curvature. ROM normal. No CVA tenderness. Cardio: regular rate and rhythm, S1, S2 normal, no murmur, click, rub or  gallop GI: soft, non-tender; bowel sounds normal; no masses,  no organomegaly Extremities: extremities normal, atraumatic, no cyanosis or edema Neurologic: Alert and oriented X 3, normal strength and tone. Normal symmetric reflexes. Normal coordination and gait  ECOG PERFORMANCE STATUS: 1 - Symptomatic but completely ambulatory  Blood pressure (!) 130/58, pulse 79, temperature 97.6 F (36.4 C), temperature source Temporal, resp. rate 16, height 5' 10 (1.778 m), weight 121 lb 7 oz (55.1 kg), SpO2 100%.  LABORATORY DATA: Lab Results  Component Value Date   WBC 9.0 10/08/2023   HGB 11.1 (L) 10/08/2023   HCT 34.5 (L) 10/08/2023   MCV 97.5 10/08/2023   PLT 246 10/08/2023      Chemistry      Component Value Date/Time   NA 142 10/08/2023 1125   NA 140 04/22/2022 0000   K 4.2 10/08/2023 1125   CL 103 10/08/2023 1125   CO2 29 10/08/2023 1125   BUN 22 10/08/2023 1125   BUN 21 04/22/2022 0000   CREATININE 1.43 (H) 10/08/2023 1125   CREATININE 1.09 02/06/2020 1306   GLU 88 04/22/2022 0000      Component Value Date/Time   CALCIUM  9.4 10/08/2023 1125   ALKPHOS 93 10/08/2023 1125   AST 14 (L) 10/08/2023 1125   ALT 7 10/08/2023  1125   BILITOT 0.6 10/08/2023 1125       RADIOGRAPHIC STUDIES: CT CHEST WO CONTRAST Result Date: 10/13/2023 CLINICAL DATA:  Follow-up lung nodule Aspergillus EXAM: CT CHEST WITHOUT CONTRAST TECHNIQUE: Multidetector CT imaging of the chest was performed following the standard protocol without IV contrast. RADIATION DOSE REDUCTION: This exam was performed according to the departmental dose-optimization program which includes automated exposure control, adjustment of the mA and/or kV according to patient size and/or use of iterative reconstruction technique. COMPARISON:  CT 09/07/2023, 08/09/2023, 06/30/2023, 06/09/2022 FINDINGS: Cardiovascular: Limited evaluation without intravenous contrast. Mild aortic atherosclerosis. No aneurysm. Normal cardiac size. Trace  pericardial effusion or thickening Mediastinum/Nodes: Patent trachea. No thyroid  mass. Slight increased AP window nodes measuring up to 12 mm on series 2, image 21, previously 7 mm. Limited assessment for hilar nodes. Esophagus within normal limits. Lungs/Pleura: Cavitary lesion in the right upper lobe with increased air component, this measures 2.2 x 2.5 cm on series 4, image 28 compared with 2.7 x 2.7 cm. Interim development of heterogeneous consolidation in the right upper lobe anterior and superior to the cavitary mass. Left upper lobe cavitary lesion measures about 3.5 x 2.6 cm on series 4, image 53, previously 3.5 x 2.7 cm when measured in similar fashion. Nodular soft tissue thickening at the periphery of the cavity. Bilateral bronchiectasis. Triangular and bandlike density in the left upper lobe on series 4, image 68 slightly progressed and has an appearance suggestive of scarring. Subpleural left lower lobe irregular consolidation measures 19 x 14 mm on series 4, image 52, previously 22 x 14 mm. No change. Redemonstrated multiple pulmonary nodules consistent with metastatic disease. This appears increased compared to the prior CT, for example index left lower lobe nodule measures 12 mm on series 4, image 118, previously 11 mm. Index adjacent right lower lobe pulmonary nodules measure 10 mm on series 4, image 107 and 9 mm on series 4, image 105, previously 8 and 9 mm respectively. Scattered areas of peribronchovascular nodularity within the bilateral lungs, worst in the lower lobes. Upper Abdomen: Hypodense right hepatic lobe lesion measures about 15 mm previously 23 mm. Lesion adjacent to the pancreatic tail measures 11 mm on series 2, image 57, previously 11 mm. Musculoskeletal: No acute cour or suspicious osseous abnormality. IMPRESSION: 1. Redemonstrated cavitary lesions in the right upper lobe and left upper lobe. The right upper lobe lesion has slightly decreased in size while the left upper lobe  lesion is stable. Interim development of heterogeneous consolidation in the right upper lobe anterior and superior to the cavitary mass, this may be secondary to superimposed acute infection. 2. Redemonstrated multiple pulmonary nodules consistent with metastatic disease, several of the nodules appear increased in size compared to prior. Scattered areas of peribronchovascular nodularity within the bilateral lungs, worst in the lower lobes, this could be infectious, inflammatory, or neoplastic in etiology. 3. Slight increased AP window nodes, reactive versus metastatic in etiology 4. Decreased hypodense liver lesion compared to prior. Stable 11 mm hyperdense nodule at the pancreatic tail 5. Aortic atherosclerosis. Aortic Atherosclerosis (ICD10-I70.0). Electronically Signed   By: Esmeralda Hedge M.D.   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  He is status post right open radical nephrectomy in  June 2023 followed by 9 cycles of treatment with immunotherapy with Keytruda  last dose was given May 21, 2022 before it was discontinued secondary to disease progression.  The patient then started systemic chemotherapy with nivolumab  and Cabometyx  initially at 40 mg p.o. daily but this was reduced to 20 mg p.o. daily before it was discontinued secondary to intolerance.  His treatment with nivolumab  was discontinued after 7 cycles secondary to disease progression.  The patient was seen by Dr. Zada Herrlich at [facility] center for a second opinion and she recommended for the patient treatment with lenvatinib  and everolimus  starting at a reduced dose. He is currently on treatment with lenvatinib  14 mg p.o. daily in addition to everolimus  5 mg p.o. daily started  February 02, 2023.  He has been tolerating this treatment fairly well except for occasional diarrhea.  His treatment has been on hold since July 07, 2023 secondary to suspicious pulmonary infection/inflammatory process. He had repeat CT scan of the chest performed recently.  I personally independently reviewed the scan and discussed the result with the patient and his wife.  His scan showed redemonstration of the cavitary lesions in the right upper lobe and left upper lobe with the right upper lobe lesion slightly decreased in size.  There is also multiple pulmonary nodules consistent with metastatic disease several centimeters opdualag..  With the prior exam.  There was also slight increase in AP window lymph nodes could be reactive versus metastatic.  There is decrease in the hypodense liver lesion compared to the prior exam. Assessment and Plan    Metastatic renal cell carcinoma Metastatic renal cell carcinoma, papillary subtype with rhabdoid features, with progression in lung nodules and lymph nodes. Off treatment since July 07, 2023, due to pulmonary fungal infection. Recent imaging indicates disease progression, necessitating resumption of treatment. Reports feeling better off cancer medication, but acknowledges need to resume treatment. Concerns about potential drug interactions between fluconazole and cancer treatment medications. - Coordinate with pulmonologist and infectious disease specialist to determine duration of Voriconazole  treatment. - Evaluate potential drug interactions between Voriconazole  and cancer treatment medications. - Resume lenvatinib  and everolimus  once Voriconazole  treatment is concluded or deemed compatible. - Schedule follow-up appointment in two weeks to reassess treatment plan.  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 Recent imaging shows decrease in size of infection, but it is not resolved. Coordination with pulmonologist  and infectious disease specialist ongoing to determine treatment duration. - Continue Voriconazole  treatment as per pulmonologist's guidance.  Edema Edema managed with Lasix for fluid retention. New issues and changes in management discussed, including ongoing evaluation of medication interactions and timing of cancer therapy resumption.  Cataracts Early cataract formation noted by optometrist. Referral to ophthalmologist for further evaluation. Concerned about timing of cataract treatment in relation to resuming cancer therapy. - Schedule ophthalmology appointment for cataract evaluation. - Discuss timing of cataract treatment in relation to cancer therapy resumption.   The patient will come back for follow-up visit in 2 weeks. He was advised to call immediately if he has any concerning symptoms in the interval. The patient voices understanding of current disease status and treatment options and is in agreement with the current care plan.  All questions were answered. The patient knows to call the clinic with any problems, questions or concerns. We can certainly see the patient much sooner if necessary.  The total time spent in the appointment was 35 minutes.  Disclaimer: This note was dictated with voice recognition software. Similar  sounding words can inadvertently be transcribed and may not be corrected upon review.

## 2023-10-14 NOTE — Progress Notes (Signed)
 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   CRI (chronic renal insufficiency) 03/30/2022   Cancer of kidney (HCC) 11/18/2021   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   Acquired leg length discrepancy 07/15/2021   Lumbar radiculopathy 07/15/2021   Other idiopathic scoliosis, 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/15/2021   Muscle cramps 07/08/2018   Sun-damaged skin 12/06/2016   Right shoulder pain 12/03/2016   Preventative health care 06/07/2016   Vitamin D  deficiency 06/02/2015   Medicare annual wellness visit, subsequent 08/19/2014   Hyperglycemia 05/25/2012   Hypothyroidism 03/14/2009   Anemia 03/14/2009   FREQUENCY, URINARY 03/14/2009   UNS ADVRS EFF OTH RX MEDICINAL&BIOLOGICAL SBSTNC 03/14/2009   ERECTILE DYSFUNCTION 10/19/2007   Hyperlipidemia, mixed 09/21/2007   Osteoarthritis 09/21/2007   Essential hypertension 01/12/2007   GERD 01/12/2007      HPI: Micheal Mcintyre is a 83 y.o. male with renal cancer s/p right nephrectomy 2023, subsequent metastatic to lung/liver, on chemo (everolimus  and lenvatinib  tyrsine kinase inhibitor -- since 03/2023), referred to us  due to bronch culture with aspergillus  Patient has had ct scan 06/30/23 for cancer monitoring. There was left upper lobe and left upper lobe cavirary lesion. Patient was coughing (no hemoptysis, fever, chill, nightsweat, fatigue, flulike illness).   Repeat 08/09/23 ct chest showed enlarged  and increased numbers/increased size of cavitation  He was sent to pulm for bronch 08/17/2023. Biopsies were negative for malignancy but culture was positive for aspergillus  Patient was  taken off chemo in 07/2023 and started on vori in August 17, 2023  Patient doesn't recall feeling sick in anyway except when he was on cancer treatment  He last saw pulm 09/24/23. 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 Hx right hip replacement 2005  Walks with a cane  He feels really well off chemo    Review of Systems: ROS All other ros negative      Past Medical History:  Diagnosis Date   Arthritis    GERD (gastroesophageal reflux disease)    Hypertension    Hypothyroidism    Medicare annual wellness visit, subsequent 08/19/2014   Osteoarthritis 09/21/2007   Qualifier: Diagnosis of  By: Kingsley Penny MD, Cindie Credit R    PONV (postoperative nausea and vomiting)    Preventative health care 06/07/2016   Right shoulder pain 12/03/2016    Social History   Tobacco Use   Smoking status: Never    Passive exposure: Never   Smokeless tobacco: Never  Vaping Use   Vaping status: Never Used  Substance Use Topics   Alcohol use: Not Currently    Comment: occ beer   Drug use: No    Family History  Problem Relation Age of Onset   Heart attack Father 15       deceased   Diabetes Maternal Uncle        maternal grandfather   Hypertension Neg Hx    Breast cancer Neg Hx  Colon cancer Neg Hx    Prostate cancer Neg Hx     Allergies  Allergen Reactions   Statins Other (See Comments)    myalgia    OBJECTIVE: Vitals:   10/14/23 1512  BP: 126/63  Pulse: 60  Temp: 97.7 F (36.5 C)  TempSrc: Temporal  SpO2: 99%  Weight: 121 lb 9.6 oz (55.2 kg)  Height: 5' 10 (1.778 m)   Body mass index is 17.45 kg/m.   Physical Exam General/constitutional: no distress, pleasant HEENT: Normocephalic, PER, Conj Clear, EOMI, Oropharynx clear Neck supple CV: rrr no mrg Lungs: clear to auscultation,  normal respiratory effort Abd: Soft, Nontender Ext: no edema Skin: No Rash Neuro: nonfocal MSK: walks with cane; slightly shorter left lower ext  Lab:  Microbiology:  Serology:  Imaging: Reviewed   09/07/23 ct chest CT Chest 09/07/2023 Interval decrease in size of the 2.7 cm pleural-based mass with partial cavitation laterally in the right upper lobe. 2. Interval decrease in size of the 3.2 cm cavitary lesion laterally in the left upper lobe. 3. Interval new 1.2 cm triangular nodule or nodular consolidation in the left upper lobe. 4. Interval decrease in size of the previous central ground-glass and peripheral rim consolidation in the posterior left lower lobe, now more confluent and homogeneous consolidation. 5. Innumerable bilateral pulmonary nodules, some of which are associated with peribronchovascular tree-in-bud interstitial change, and therefore likely inflammatory but majority are almost certainly metastatic. 6. Index nodules are slightly larger in the interval. 7. Hypodense presumably metastases in the liver are slightly smaller in the interval. 8. 1.1 cm hyperdense lesion in the pancreatic tail is unchanged, possible metastasis. 9. Aortic atherosclerosis.    Assessment/plan: Problem List Items Addressed This Visit   None Visit Diagnoses       Aspergillus (HCC)    -  Primary   Relevant Orders   Aspergillus Antigen, Serum   CBC w/Diff   COMPLETE METABOLIC PANEL WITHOUT GFR         Immunosuppressed (everolimus  and and lenvima ) Renal cancer Pulm aspergillus  Surprised to see he has aspergillus with these 2 cancer drugs Bx no malignancy but culture positive and worsening chest ct prior to vori, improved on it  I Bharat't have any pretreatment aspergillus ag   Will track aspergillus ag Cbc/cmp already done At least 3 months vori Agree repeat chest ct in 4-6 weeks  F/u wth me in 6 weeks  If urgent treatment for cancer needed, no contraindication  from my standpoint as long as voriconazole  is continued     Follow-up: Return in about 6 weeks (around 11/25/2023).  Jamesetta Mcbride, MD [facility] 10/14/2023, 3:29 PM

## 2023-10-15 ENCOUNTER — Encounter: Payer: Self-pay | Admitting: Acute Care

## 2023-10-15 ENCOUNTER — Ambulatory Visit (INDEPENDENT_AMBULATORY_CARE_PROVIDER_SITE_OTHER): Admitting: Acute Care

## 2023-10-15 VITALS — BP 128/73 | HR 63 | Ht 70.0 in | Wt 122.0 lb

## 2023-10-15 DIAGNOSIS — Z85528 Personal history of other malignant neoplasm of kidney: Secondary | ICD-10-CM

## 2023-10-15 DIAGNOSIS — B449 Aspergillosis, unspecified: Secondary | ICD-10-CM | POA: Diagnosis not present

## 2023-10-15 DIAGNOSIS — R911 Solitary pulmonary nodule: Secondary | ICD-10-CM

## 2023-10-15 NOTE — Patient Instructions (Addendum)
 It is good to see you today. I am glad you are feeling well, and you have gained 5 pounds.  We have reviewed your CT scan.  As there has been some progression in the chest, we agree with [name] that your chemo meds should be restarted. Per ID, Dr. Chrystal Crape urgent treatment for cancer needed, no contraindication from his  standpoint as long as voriconazole  is continued  However these drugs have serious interactions, and cannot be given together.  We will have to discuss with [name] and Dr. Shereen Dike regarding when to resume chemotherapy. I have also asked pharmacy if there are any other antifungal agents that may have a better interaction profile with your chemo agents.  Follow up CT Chest in 6 weeks has been ordered Follow up with Dr. Shereen Dike 11/25/2023 as is scheduled. Follow up in 2 months with Micheal Manuel NP to review the scan and ensure you are doing well.  Call if you need us  sooner. Please contact office for sooner follow up if symptoms do not improve or worsen or seek emergency care

## 2023-10-15 NOTE — Progress Notes (Signed)
 History of Present Illness Micheal Mcintyre is a 83 y.o. male never smoker referred to [name] for evaluation of a lung mass in setting of immunocompromised patient with history or metastatic renal cancer currently undergoing treatment. He is followed by [name] ( Pulmonary)  , [name] ( Medical oncology) and Dr. Shereen Dike( ID)  10/15/2023 83 year old gentleman with metastatic renal cell carcinoma followed by [name]. I saw him in late March after a CT scan of his chest 06/30/2023 revealed new right upper lobe, left upper lobe partially cavitary opacities concerning for either infectious process or malignancy. He was treated with with doxycycline  for 2 weeks. We repeated his CT scan of the chest to look for interval improvement after antibiotic treatment.    CT scan of the chest done 08/09/2023 shows enlarged more consolidated opacities in the right upper lobe, right lower lobe, still with areas of cavitation. There is an evolving less prominent left lower lobe infiltrate with partial cavitation as well that was not present in February. Also some scattered smaller subcentimeter pulmonary nodules right lower lobe predominant. [name] spoke with the patient and his wife, as there were persistent mass like opacities, even after treatment wioth antibiotics, he has  recommended navigational bronchoscopy for biopsies and culture data. This was done 08/17/2023.  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 [name] started him on patient on voriconazole  twice daily x 1 month. There were noted interactions with some of the patient's maintenance chemo meds, specifically lenvatinib  and everolimus  . These are on hold while he is taking the antifungal. He has been off his chemo agents since 07/07/2023.   Repeat CT Chest Imaging 10/08/2023 shows disease progression, necessitating resumption of treatment. However, ID would like patient to remain on Voriconazole  for another 4-6 weeks.  CT Chest has been ordered and patient has follow up with Dr. Shereen Dike 11/25/2023.   I agree with [name] that chemo treatment needs to be resumed. He cannot be on both Voriconozole and lenvatinib / everolimus  . The question here is can we wait another month to resume chemotherapy?   I have asked pharmacy if there are any additional antifungals that have a better profile for use with the chemo agents. They state that both Posaconazole and Isavuconazole may have fewer interactions, but I will defer to ID regarding antifungal treatment. This is difficult, as patient has had good response on Voriconazole .   I did order the Ct Chest for 6 weeks. I will copy this note to Both [name] and Dr. Shereen Dike.   Pt is doing well. He has gained 5 lbs while off chemo medications. He states he feels much better off chemo, but knows he must resume therapy with the progression on his Ct Chest.   Test Results: CT Chest 10/13/2023 Patent trachea. No thyroid  mass. Slight increased AP window nodes measuring up to 12 mm on series 2, image 21, previously 7 mm. Limited assessment for hilar nodes. Esophagus within normal limits.   Lungs/Pleura: Cavitary lesion in the right upper lobe with increased air component, this measures 2.2 x 2.5 cm on series 4, image 28 compared with 2.7 x 2.7 cm. Interim development of heterogeneous consolidation in the right upper lobe anterior and superior to the cavitary mass. Left upper lobe cavitary lesion measures about 3.5 x 2.6 cm on series 4, image 53, previously 3.5 x 2.7 cm when measured in similar fashion. Nodular soft tissue thickening at the periphery of the cavity.  Bilateral bronchiectasis. Triangular and bandlike density in the left upper lobe on series 4, image 68 slightly progressed and has an appearance suggestive of scarring. Subpleural left lower lobe irregular consolidation measures 19 x 14 mm on series 4, image 52, previously 22 x 14 mm. No change.   Redemonstrated  multiple pulmonary nodules consistent with metastatic disease. This appears increased compared to the prior CT, for example index left lower lobe nodule measures 12 mm on series 4, image 118, previously 11 mm. Index adjacent right lower lobe pulmonary nodules measure 10 mm on series 4, image 107 and 9 mm on series 4, image 105, previously 8 and 9 mm respectively.   Scattered areas of peribronchovascular nodularity within the bilateral lungs, worst in the lower lobes.   Redemonstrated cavitary lesions in the right upper lobe and left upper lobe. The right upper lobe lesion has slightly decreased in size while the left upper lobe lesion is stable. Interim development of heterogeneous consolidation in the right upper lobe anterior and superior to the cavitary mass, this may be secondary to superimposed acute infection. 2. Redemonstrated multiple pulmonary nodules consistent with metastatic disease, several of the nodules appear increased in size compared to prior. Scattered areas of peribronchovascular nodularity within the bilateral lungs, worst in the lower lobes, this could be infectious, inflammatory, or neoplastic in etiology. 3. Slight increased AP window nodes, reactive versus metastatic in etiology 4. Decreased hypodense liver lesion compared to prior. Stable 11 mm hyperdense nodule at the pancreatic tail 5. Aortic atherosclerosis.   Aortic Atherosclerosis (ICD10-I70.0).   CT Chest 09/07/2023 Interval decrease in size of the 2.7 cm pleural-based mass with partial cavitation laterally in the right upper lobe. 2. Interval decrease in size of the 3.2 cm cavitary lesion laterally in the left upper lobe. 3. Interval new 1.2 cm triangular nodule or nodular consolidation in the left upper lobe. 4. Interval decrease in size of the previous central ground-glass and peripheral rim consolidation in the posterior left lower lobe, now more confluent and homogeneous consolidation. 5.  Innumerable bilateral pulmonary nodules, some of which are associated with peribronchovascular tree-in-bud interstitial change, and therefore likely inflammatory but majority are almost certainly metastatic. 6. Index nodules are slightly larger in the interval. 7. Hypodense presumably metastases in the liver are slightly smaller in the interval. 8. 1.1 cm hyperdense lesion in the pancreatic tail is unchanged, possible metastasis. 9. Aortic atherosclerosis.       Cytology 08/17/2023 FINAL MICROSCOPIC DIAGNOSIS:  A. LUNG, RUL OPACITY, FINE NEEDLE ASPIRATION:  - No malignant cells identified  - Fungus consistent with Aspergillus   B. LUNG, RUL OPACITY, BRUSHING:  - No malignant cells identified  - Fungus consistent with Aspergillus      D. LUNG, LUL SUPERIOR SEGMENT, FINE NEEDLE ASPIRATION  BIOPSIES:  - No malignant cells present  - Few benign/reactive mesothelial cells  - Mixed inflammatory cells    Super D CT 08/09/2023 Pulmonary metastatic disease, minimally progressive. 2. New and evolving areas of consolidation, ground-glass and cavitation, indicative of a fungal pneumonia. 3. Stable hepatic metastatic disease. 4. Stable mildly hyperdense lesion along the pancreatic tail, possibly a metastasis. 5.  Aortic atherosclerosis (ICD10-I70.0).         Micro 09/16/2023 Negative for fungus, but biopsy was + for aspergillus.  AFB Negative   Started on voriconazole  by [name] 08/18/2023.     Latest Ref Rng & Units 10/08/2023   11:25 AM 09/08/2023    2:52 PM 08/05/2023    2:35 PM  CBC  WBC 4.0 - 10.5 K/uL 9.0  7.3  8.4   Hemoglobin 13.0 - 17.0 g/dL 29.5  62.1  9.6   Hematocrit 39.0 - 52.0 % 34.5  34.9  30.4   Platelets 150 - 400 K/uL 246  286  297        Latest Ref Rng & Units 10/08/2023   11:25 AM 09/24/2023   11:58 AM 09/08/2023    2:52 PM  BMP  Glucose 70 - 99 mg/dL 89  68  308   BUN 8 - 23 mg/dL 22  28  19    Creatinine 0.61 - 1.24 mg/dL 6.57  8.46  9.62   Sodium 135 -  145 mmol/L 142  145  138   Potassium 3.5 - 5.1 mmol/L 4.2  4.8  4.5   Chloride 98 - 111 mmol/L 103  104  98   CO2 22 - 32 mmol/L 29  30  31    Calcium  8.9 - 10.3 mg/dL 9.4  9.7  9.4     BNP No results found for: BNP  ProBNP No results found for: PROBNP  PFT No results found for: FEV1PRE, FEV1POST, FVCPRE, FVCPOST, TLC, DLCOUNC, PREFEV1FVCRT, PSTFEV1FVCRT  CT CHEST WO CONTRAST Result Date: 10/13/2023 CLINICAL DATA:  Follow-up lung nodule Aspergillus EXAM: CT CHEST WITHOUT CONTRAST TECHNIQUE: Multidetector CT imaging of the chest was performed following the standard protocol without IV contrast. RADIATION DOSE REDUCTION: This exam was performed according to the departmental dose-optimization program which includes automated exposure control, adjustment of the mA and/or kV according to patient size and/or use of iterative reconstruction technique. COMPARISON:  CT 09/07/2023, 08/09/2023, 06/30/2023, 06/09/2022 FINDINGS: Cardiovascular: Limited evaluation without intravenous contrast. Mild aortic atherosclerosis. No aneurysm. Normal cardiac size. Trace pericardial effusion or thickening Mediastinum/Nodes: Patent trachea. No thyroid  mass. Slight increased AP window nodes measuring up to 12 mm on series 2, image 21, previously 7 mm. Limited assessment for hilar nodes. Esophagus within normal limits. Lungs/Pleura: Cavitary lesion in the right upper lobe with increased air component, this measures 2.2 x 2.5 cm on series 4, image 28 compared with 2.7 x 2.7 cm. Interim development of heterogeneous consolidation in the right upper lobe anterior and superior to the cavitary mass. Left upper lobe cavitary lesion measures about 3.5 x 2.6 cm on series 4, image 53, previously 3.5 x 2.7 cm when measured in similar fashion. Nodular soft tissue thickening at the periphery of the cavity. Bilateral bronchiectasis. Triangular and bandlike density in the left upper lobe on series 4, image 68 slightly  progressed and has an appearance suggestive of scarring. Subpleural left lower lobe irregular consolidation measures 19 x 14 mm on series 4, image 52, previously 22 x 14 mm. No change. Redemonstrated multiple pulmonary nodules consistent with metastatic disease. This appears increased compared to the prior CT, for example index left lower lobe nodule measures 12 mm on series 4, image 118, previously 11 mm. Index adjacent right lower lobe pulmonary nodules measure 10 mm on series 4, image 107 and 9 mm on series 4, image 105, previously 8 and 9 mm respectively. Scattered areas of peribronchovascular nodularity within the bilateral lungs, worst in the lower lobes. Upper Abdomen: Hypodense right hepatic lobe lesion measures about 15 mm previously 23 mm. Lesion adjacent to the pancreatic tail measures 11 mm on series 2, image 57, previously 11 mm. Musculoskeletal: No acute cour or suspicious osseous abnormality. IMPRESSION: 1. Redemonstrated cavitary lesions in the right upper lobe and left upper lobe. The right upper lobe  lesion has slightly decreased in size while the left upper lobe lesion is stable. Interim development of heterogeneous consolidation in the right upper lobe anterior and superior to the cavitary mass, this may be secondary to superimposed acute infection. 2. Redemonstrated multiple pulmonary nodules consistent with metastatic disease, several of the nodules appear increased in size compared to prior. Scattered areas of peribronchovascular nodularity within the bilateral lungs, worst in the lower lobes, this could be infectious, inflammatory, or neoplastic in etiology. 3. Slight increased AP window nodes, reactive versus metastatic in etiology 4. Decreased hypodense liver lesion compared to prior. Stable 11 mm hyperdense nodule at the pancreatic tail 5. Aortic atherosclerosis. Aortic Atherosclerosis (ICD10-I70.0). Electronically Signed   By: Esmeralda Hedge M.D.   On: 10/13/2023 20:48     Past medical  hx Past Medical History:  Diagnosis Date   Arthritis    GERD (gastroesophageal reflux disease)    Hypertension    Hypothyroidism    Medicare annual wellness visit, subsequent 08/19/2014   Osteoarthritis 09/21/2007   Qualifier: Diagnosis of  By: Kingsley Penny MD, Darlene Ehlers    PONV (postoperative nausea and vomiting)    Preventative health care 06/07/2016   Right shoulder pain 12/03/2016     Social History   Tobacco Use   Smoking status: Never    Passive exposure: Never   Smokeless tobacco: Never  Vaping Use   Vaping status: Never Used  Substance Use Topics   Alcohol use: Not Currently    Comment: occ beer   Drug use: No    Micheal Mcintyre reports that he has never smoked. He has never been exposed to tobacco smoke. He has never used smokeless tobacco. He reports that he does not currently use alcohol. He reports that he does not use drugs.  Tobacco Cessation: Counseling given: Not Answered Never smoker   Past surgical hx, Family hx, Social hx all reviewed.  Current Outpatient Medications on File Prior to Visit  Medication Sig   acetaminophen  (TYLENOL ) 500 MG tablet Take 1,000 mg by mouth every 6 (six) hours as needed for moderate pain.   chlorthalidone (HYGROTON) 25 MG tablet Take 25 mg by mouth every other day.   furosemide (LASIX) 20 MG tablet Take 20 mg by mouth daily as needed.   levothyroxine  (SYNTHROID ) 50 MCG tablet TAKE 1 TABLET(50 MCG) BY MOUTH DAILY BEFORE BREAKFAST   Multiple Vitamin (MULTIVITAMIN WITH MINERALS) TABS tablet Take 1 tablet by mouth 4 (four) times a week.   telmisartan  (MICARDIS ) 80 MG tablet TAKE 1/2 TABLET BY MOUTH TWICE DAILY   voriconazole  (VFEND ) 200 MG tablet Take 1 tablet (200 mg total) by mouth 2 (two) times daily.   No current facility-administered medications on file prior to visit.     Allergies  Allergen Reactions   Statins Other (See Comments)    myalgia    Review Of Systems:  Constitutional:   No  weight loss, night sweats,   Fevers, chills, fatigue, or  lassitude.  HEENT:   No headaches,  Difficulty swallowing,  Tooth/dental problems, or  Sore throat,                No sneezing, itching, ear ache, nasal congestion, post nasal drip,   CV:  No chest pain,  Orthopnea, PND, swelling in lower extremities, anasarca, dizziness, palpitations, syncope.   GI  No heartburn, indigestion, abdominal pain, nausea, vomiting, diarrhea, change in bowel habits, loss of appetite, bloody stools.   Resp: No shortness of breath with exertion or at  rest.  No excess mucus, no productive cough,  No non-productive cough,  No coughing up of blood.  No change in color of mucus.  No wheezing.  No chest wall deformity  Skin: no rash or lesions.  GU: no dysuria, change in color of urine, no urgency or frequency.  No flank pain, no hematuria   MS:  No joint pain or swelling.  No decreased range of motion.  No back pain.  Psych:  No change in mood or affect. No depression or anxiety.  No memory loss.   Vital Signs BP 128/73 (BP Location: Left Arm, Cuff Size: Normal)   Pulse 63   SpO2 99%    Physical Exam:  General- No distress,  A&Ox3, pleasant , frail, elderly gentleman ENT: No sinus tenderness, TM clear, pale nasal mucosa, no oral exudate,no post nasal drip, no LAN Cardiac: S1, S2, regular rate and rhythm, no murmur Chest: No wheeze/ rales/ dullness; no accessory muscle use, no nasal flaring, no sternal retractions, diminished per bases Abd.: Soft Non-tender, ND, BS +, Body mass index is 17.51 kg/m.  Ext: No clubbing cyanosis, trace BLE edema Neuro: Physical deconditioning, MAE x 4, A&O x 3, appropriate Skin: No rashes, warm and dry, no obvious lesions  Psych: normal mood and behavior   Assessment/Plan Renal Cell Carcinoma  Treatment with voriconazole  for positive Aspergillus Cx ( Bronch 08/17/2023), with improvement on imaging   Also followed by ID. Chemo on hold 2/2 serious interaction between voriconazole  and lenvatinib   and everolimus   Progression of disease off chemotherapy agents Plan I am glad you are feeling well, and you have gained 5 pounds.  We have reviewed your CT scan.  As there has been some progression in the chest, we agree with [name] that your chemo meds should be restarted. Per ID, Dr. Chrystal Crape urgent treatment for cancer needed, no contraindication from his  standpoint as long as voriconazole  is continued  However these drugs have serious interactions, and cannot be given together.  We will have to discuss with [name] and Dr. Shereen Dike regarding when to resume chemotherapy. I have also asked pharmacy if there are any other antifungal agents that may have a better interaction profile with your chemo agents.  Follow up CT Chest in 6 weeks has been ordered Follow up with Dr. Shereen Dike 11/25/2023 as is scheduled. Follow up in 2 months with Micheal Manuel NP to review the scan and ensure you are doing well.  Call if you need us  sooner. Please contact office for sooner follow up if symptoms do not improve or worsen or seek emergency care    I spent 40 minutes dedicated to the care of this patient on the date of this encounter to include pre-visit review of records, face-to-face time with the patient discussing conditions above, post visit ordering of testing, clinical documentation with the electronic health record, making appropriate referrals as documented, and communicating necessary information to the patient's healthcare team.     Raejean Bullock, NP 10/15/2023  10:53 AM

## 2023-10-18 ENCOUNTER — Other Ambulatory Visit: Payer: Self-pay

## 2023-10-18 LAB — ASPERGILLUS ANTIGEN,SERUM
Aspergillus Ag, EIA: NOT DETECTED
Index Value: 0.08 (ref <0.50)

## 2023-10-19 ENCOUNTER — Inpatient Hospital Stay: Payer: PRIVATE HEALTH INSURANCE | Admitting: Physician Assistant

## 2023-10-20 DIAGNOSIS — K080 Exfoliation of teeth due to systemic causes: Secondary | ICD-10-CM | POA: Diagnosis not present

## 2023-10-21 ENCOUNTER — Other Ambulatory Visit: Payer: Self-pay

## 2023-10-28 ENCOUNTER — Other Ambulatory Visit: Payer: Self-pay | Admitting: Pharmacy Technician

## 2023-10-28 ENCOUNTER — Inpatient Hospital Stay: Payer: PRIVATE HEALTH INSURANCE

## 2023-10-28 ENCOUNTER — Inpatient Hospital Stay: Payer: PRIVATE HEALTH INSURANCE | Admitting: Internal Medicine

## 2023-10-28 ENCOUNTER — Other Ambulatory Visit (HOSPITAL_COMMUNITY): Payer: Self-pay

## 2023-10-28 ENCOUNTER — Telehealth: Payer: Self-pay | Admitting: Pharmacist

## 2023-10-28 ENCOUNTER — Other Ambulatory Visit: Payer: Self-pay

## 2023-10-28 VITALS — BP 135/70 | HR 69 | Temp 97.7°F | Resp 18 | Ht 70.0 in | Wt 116.3 lb

## 2023-10-28 DIAGNOSIS — C649 Malignant neoplasm of unspecified kidney, except renal pelvis: Secondary | ICD-10-CM | POA: Diagnosis not present

## 2023-10-28 DIAGNOSIS — C641 Malignant neoplasm of right kidney, except renal pelvis: Secondary | ICD-10-CM

## 2023-10-28 LAB — CBC WITH DIFFERENTIAL (CANCER CENTER ONLY)
Abs Immature Granulocytes: 0.03 10*3/uL (ref 0.00–0.07)
Basophils Absolute: 0.1 10*3/uL (ref 0.0–0.1)
Basophils Relative: 1 %
Eosinophils Absolute: 0.2 10*3/uL (ref 0.0–0.5)
Eosinophils Relative: 3 %
HCT: 35.5 % — ABNORMAL LOW (ref 39.0–52.0)
Hemoglobin: 11.5 g/dL — ABNORMAL LOW (ref 13.0–17.0)
Immature Granulocytes: 1 %
Lymphocytes Relative: 12 %
Lymphs Abs: 0.8 10*3/uL (ref 0.7–4.0)
MCH: 31.2 pg (ref 26.0–34.0)
MCHC: 32.4 g/dL (ref 30.0–36.0)
MCV: 96.2 fL (ref 80.0–100.0)
Monocytes Absolute: 0.7 10*3/uL (ref 0.1–1.0)
Monocytes Relative: 11 %
Neutro Abs: 4.7 10*3/uL (ref 1.7–7.7)
Neutrophils Relative %: 72 %
Platelet Count: 224 10*3/uL (ref 150–400)
RBC: 3.69 MIL/uL — ABNORMAL LOW (ref 4.22–5.81)
RDW: 13.9 % (ref 11.5–15.5)
WBC Count: 6.4 10*3/uL (ref 4.0–10.5)
nRBC: 0 % (ref 0.0–0.2)

## 2023-10-28 LAB — CMP (CANCER CENTER ONLY)
ALT: 6 U/L (ref 0–44)
AST: 14 U/L — ABNORMAL LOW (ref 15–41)
Albumin: 3.8 g/dL (ref 3.5–5.0)
Alkaline Phosphatase: 106 U/L (ref 38–126)
Anion gap: 5 (ref 5–15)
BUN: 23 mg/dL (ref 8–23)
CO2: 31 mmol/L (ref 22–32)
Calcium: 9.2 mg/dL (ref 8.9–10.3)
Chloride: 105 mmol/L (ref 98–111)
Creatinine: 1.33 mg/dL — ABNORMAL HIGH (ref 0.61–1.24)
GFR, Estimated: 54 mL/min — ABNORMAL LOW (ref >60)
Glucose, Bld: 95 mg/dL (ref 70–99)
Potassium: 4.3 mmol/L (ref 3.5–5.1)
Sodium: 141 mmol/L (ref 135–145)
Total Bilirubin: 0.3 mg/dL (ref 0.0–1.2)
Total Protein: 6.8 g/dL (ref 6.5–8.1)

## 2023-10-28 MED ORDER — AXITINIB 1 MG PO TABS
3.0000 mg | ORAL_TABLET | Freq: Two times a day (BID) | ORAL | 3 refills | Status: DC
  Filled 2023-10-28: qty 180, 30d supply, fill #0
  Filled 2023-11-17: qty 180, 30d supply, fill #1
  Filled 2023-12-20: qty 180, 30d supply, fill #2
  Filled 2024-01-25: qty 180, 30d supply, fill #3

## 2023-10-28 NOTE — Telephone Encounter (Addendum)
 Oral Chemotherapy Pharmacist Encounter  I met with patient and patient's wife in clinic for overview of: Inlyta  (axitinib ) for the treatment of metastatic papillary renal cell carcinoma, planned duration until disease progression or unacceptable toxicity.   Counseled patient on administration, dosing, side effects, monitoring, drug-food interactions, safe handling, storage, and disposal.  CBC w/ Diff and CMP from 10/28/23 assessed, noted patient Scr of 1.33 mg/dL (CrCl ~66 mL/min). No baseline dose adjustments required for renal impairment. Dose of Inlyta  has been reduced 50% due to drug-drug interaction with patient's current voriconazole  treatment. Prescription dose and frequency assessed for appropriateness.  Patient will take Inlyta  1mg  tablets, 3 tablets (3 mg total) by mouth twice daily, without regard to food, with a glass of water.  Patient instructed to avoid grapefruit and grapefruit juice while on therapy with Inlyta .  Inlyta  start date: 11/03/23  Adverse effects include but are not limited to: hypertension, hand-foot syndrome, nausea, vomiting, diarrhea, fatigue, dysphonia (hoarseness), and abnormal laboratory values.   Hand-foot syndrome: discussed use of cream such as [place] Smooth Extra Care 20 or equivalent advanced care cream that has 20% urea content for advanced skin hydration while on Inlyta .  Diarrhea: Patient will obtain Imodium (loperamide) to have on hand if they experience diarrhea. Patient knows to alert the office of 4 or more loose stools above baseline. Delayed wound healing: Inlyta  will be held at least 24 hours prior to surgery and resumed at discretion of treating physician based on wound healing. N/V PPX: Patient states he has both prochlorperazine  and ondansetron  available at home. Recommended patient consider taking prochlorperazine  pre-AM and PM doses of Inlyta  as he previously had issues with N/V with Cabometyx  in the past. Recommended patient hold off on  ondansetron  due to Qtc prolongation risk when used with voriconzaole.   Reviewed with patient importance of keeping a medication schedule and plan for any missed doses. No barriers to medication adherence identified.  Medication reconciliation performed and medication/allergy list updated. Current medication list in Epic reviewed, DDIs with Inlyta  identified: Category D drug-drug interaction between Inlyta  and Voriconazole  - Voriconazole , a strong CYP3A4 inhibitor can increase serum concentrations of Inlyta . It's recommended to consider a 50% dose reduction when used concomitantly (this has been done). Once patient has stopped voriconzole, if patient is tolerating Inlyta  at that time, it can be considered to increase Inlyta  to 5 mg BID at that time (consider only after patient has been off voriconazole  2-3 days.)  Patient agreement for treatment documented in MD note on 10/28/23.  All questions answered.  Mr. Neto voiced understanding and appreciation.   Medication education handout given to. Patient knows to call the office with questions or concerns. Oral Chemotherapy Clinic phone number provided to patient.   Asberry Macintosh, PharmD, BCPS, BCOP Hematology/Oncology Clinical Pharmacist [facility] Oral Chemotherapy Navigation Clinics [phone] 10/28/2023 12:09 PM

## 2023-10-28 NOTE — Progress Notes (Signed)
 Specialty Pharmacy Initial Fill Coordination Note  Micheal Mcintyre is a 83 y.o. male contacted today regarding refills of specialty medication(s) Axitinib (INLYTA) .  Patient requested Delivery  on 11/09/23  to verified address [address]  [place] [address]   Medication will be filled on 11/04/23 (or 7/7 to ship same day).   Patient is aware of $0 copayment.

## 2023-10-28 NOTE — Progress Notes (Signed)
 [facility] [phone]   [phone]  OFFICE PROGRESS NOTE  [name], MD 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    PRIOR THERAPY: 1) Status post right open radical nephrectomy completed on October 15, 2021 by Dr. Donnice Siad.  2) Keytruda  200 Mg IV every 3 weeks started December 03, 2021.  Status post 9 cycles.  Last dose was given May 21, 2022.  This was discontinued secondary to disease progression. 3) Systemic treatment with nivolumab  480 Mg IV every 4 weeks in addition to Cabometyx  40 mg p.o. daily.  First dose June 18, 2022.  Status post 7 cycles.  His Cabometyx  has been on hold recently due to adverse side effects. His dose was reduced in May to 20 mg p.o. daily but he continues to have weakness, diarrhea, and dehydration. This has been on hold since 5/19.  Last dose was given December 02, 2022 discontinued secondary to disease progression. 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   CURRENT THERAPY: Axitinib 3 mg p.o. twice daily.  Expected to start in the next few days.  INTERVAL HISTORY: Micheal Mcintyre 83 y.o. male returns to the clinic today for follow-up visit accompanied by his wife.Discussed the use of AI scribe software for clinical note transcription with the patient, who gave verbal consent to proceed.  History of Present Illness   Micheal Mcintyre is an 83 year old male with metastatic renal cell carcinoma who presents for evaluation and discussion of treatment options. He is accompanied by his wife.  He was diagnosed with metastatic  papillary renal cell carcinoma in February 2024, with liver metastasis. He has undergone several treatments, most recently with lenvatinib  and everolimus , which were paused in March 2025 due to an aspergillus pulmonary infection.  He is on long-term voriconazole  treatment for the aspergillus infection. There is a concern about drug interactions between Voriconazole , which he needs to continue for the infection, and his cancer medications, lenvatinib  and everolimus . This has led to discussions about alternative treatment options that would not interact with fluconazole.  He feels 'pretty good' and notes that he has been eating well, although he has noticed some weight loss despite his appetite. He mentions visiting multiple doctors, each with different scales, which has caused some confusion about his weight.  No other complaints and feels well overall.        MEDICAL HISTORY: Past Medical History:  Diagnosis Date   Arthritis    GERD (gastroesophageal reflux disease)    Hypertension    Hypothyroidism    Medicare annual wellness visit, subsequent 08/19/2014   Osteoarthritis 09/21/2007   Qualifier: Diagnosis of  By: Viviann Raddle MD, Marsha R    PONV (postoperative nausea and vomiting)    Preventative health care 06/07/2016   Right shoulder pain 12/03/2016    ALLERGIES:  is allergic to statins.  MEDICATIONS:  Current Outpatient Medications  Medication Sig Dispense Refill   acetaminophen  (TYLENOL ) 500 MG tablet Take 1,000 mg by mouth every 6 (six) hours as needed for moderate pain.  chlorthalidone (HYGROTON) 25 MG tablet Take 25 mg by mouth every other day.     furosemide (LASIX) 20 MG tablet Take 20 mg by mouth daily as needed.     levothyroxine  (SYNTHROID ) 50 MCG tablet TAKE 1 TABLET(50 MCG) BY MOUTH DAILY BEFORE BREAKFAST 30 tablet 1   Multiple Vitamin (MULTIVITAMIN WITH MINERALS) TABS tablet Take 1 tablet by mouth 4 (four) times a week.     telmisartan  (MICARDIS ) 80 MG tablet  TAKE 1/2 TABLET BY MOUTH TWICE DAILY 180 tablet 1   voriconazole  (VFEND ) 200 MG tablet Take 1 tablet (200 mg total) by mouth 2 (two) times daily. 60 tablet 3   No current facility-administered medications for this visit.    SURGICAL HISTORY:  Past Surgical History:  Procedure Laterality Date   BRONCHIAL BIOPSY  08/17/2023   Procedure: BRONCHOSCOPY, WITH BIOPSY;  Surgeon: Shelah Lamar RAMAN, MD;  Location: [facility] ENDOSCOPY;  Service: Pulmonary;;   BRONCHIAL BRUSHINGS  08/17/2023   Procedure: BRONCHOSCOPY, WITH BRUSH BIOPSY;  Surgeon: Shelah Lamar RAMAN, MD;  Location: [facility] ENDOSCOPY;  Service: Pulmonary;;   BRONCHIAL NEEDLE ASPIRATION BIOPSY  08/17/2023   Procedure: BRONCHOSCOPY, WITH NEEDLE ASPIRATION BIOPSY;  Surgeon: Shelah Lamar RAMAN, MD;  Location: [facility] ENDOSCOPY;  Service: Pulmonary;;   COLONOSCOPY     HIP SURGERY Left    1991   JOINT REPLACEMENT Right 2005   hip   NEPHRECTOMY Right 10/15/2021   Procedure: NEPHRECTOMY, OPEN;  Surgeon: Selma Donnice SAUNDERS, MD;  Location: [facility] ORS;  Service: Urology;  Laterality: Right;   nose skin cancer removal      REVIEW OF SYSTEMS:  Constitutional: positive for anorexia, fatigue, and weight loss Eyes: negative Ears, nose, mouth, throat, and face: negative Respiratory: positive for cough Cardiovascular: negative Gastrointestinal: negative Genitourinary:negative Integument/breast: negative Hematologic/lymphatic: negative Musculoskeletal:negative Neurological: negative Behavioral/Psych: negative Endocrine: negative Allergic/Immunologic: negative   PHYSICAL EXAMINATION: General appearance: alert, cooperative, fatigued, and no distress Head: Normocephalic, without obvious abnormality, atraumatic Neck: no adenopathy, no JVD, supple, symmetrical, trachea midline, and thyroid  not enlarged, symmetric, no tenderness/mass/nodules Lymph nodes: Cervical, supraclavicular, and axillary nodes normal. Resp: clear to auscultation bilaterally Back: symmetric, no curvature.  ROM normal. No CVA tenderness. Cardio: regular rate and rhythm, S1, S2 normal, no murmur, click, rub or gallop GI: soft, non-tender; bowel sounds normal; no masses,  no organomegaly Extremities: extremities normal, atraumatic, no cyanosis or edema Neurologic: Alert and oriented X 3, normal strength and tone. Normal symmetric reflexes. Normal coordination and gait  ECOG PERFORMANCE STATUS: 1 - Symptomatic but completely ambulatory  Blood pressure 135/70, pulse 69, temperature 97.7 F (36.5 C), temperature source Temporal, resp. rate 18, height 5' 10 (1.778 m), weight 116 lb 4.8 oz (52.8 kg), SpO2 100%.  LABORATORY DATA: Lab Results  Component Value Date   WBC 6.4 10/28/2023   HGB 11.5 (L) 10/28/2023   HCT 35.5 (L) 10/28/2023   MCV 96.2 10/28/2023   PLT 224 10/28/2023      Chemistry      Component Value Date/Time   NA 142 10/08/2023 1125   NA 140 04/22/2022 0000   K 4.2 10/08/2023 1125   CL 103 10/08/2023 1125   CO2 29 10/08/2023 1125   BUN 22 10/08/2023 1125   BUN 21 04/22/2022 0000   CREATININE 1.43 (H) 10/08/2023 1125   CREATININE 1.09 02/06/2020 1306   GLU 88 04/22/2022 0000      Component Value Date/Time   CALCIUM  9.4 10/08/2023 1125   ALKPHOS 93 10/08/2023 1125   AST 14 (  L) 10/08/2023 1125   ALT 7 10/08/2023 1125   BILITOT 0.6 10/08/2023 1125       RADIOGRAPHIC STUDIES: CT CHEST WO CONTRAST Result Date: 10/13/2023 CLINICAL DATA:  Follow-up lung nodule Aspergillus EXAM: CT CHEST WITHOUT CONTRAST TECHNIQUE: Multidetector CT imaging of the chest was performed following the standard protocol without IV contrast. RADIATION DOSE REDUCTION: This exam was performed according to the departmental dose-optimization program which includes automated exposure control, adjustment of the mA and/or kV according to patient size and/or use of iterative reconstruction technique. COMPARISON:  CT 09/07/2023, 08/09/2023, 06/30/2023, 06/09/2022 FINDINGS: Cardiovascular: Limited evaluation  without intravenous contrast. Mild aortic atherosclerosis. No aneurysm. Normal cardiac size. Trace pericardial effusion or thickening Mediastinum/Nodes: Patent trachea. No thyroid  mass. Slight increased AP window nodes measuring up to 12 mm on series 2, image 21, previously 7 mm. Limited assessment for hilar nodes. Esophagus within normal limits. Lungs/Pleura: Cavitary lesion in the right upper lobe with increased air component, this measures 2.2 x 2.5 cm on series 4, image 28 compared with 2.7 x 2.7 cm. Interim development of heterogeneous consolidation in the right upper lobe anterior and superior to the cavitary mass. Left upper lobe cavitary lesion measures about 3.5 x 2.6 cm on series 4, image 53, previously 3.5 x 2.7 cm when measured in similar fashion. Nodular soft tissue thickening at the periphery of the cavity. Bilateral bronchiectasis. Triangular and bandlike density in the left upper lobe on series 4, image 68 slightly progressed and has an appearance suggestive of scarring. Subpleural left lower lobe irregular consolidation measures 19 x 14 mm on series 4, image 52, previously 22 x 14 mm. No change. Redemonstrated multiple pulmonary nodules consistent with metastatic disease. This appears increased compared to the prior CT, for example index left lower lobe nodule measures 12 mm on series 4, image 118, previously 11 mm. Index adjacent right lower lobe pulmonary nodules measure 10 mm on series 4, image 107 and 9 mm on series 4, image 105, previously 8 and 9 mm respectively. Scattered areas of peribronchovascular nodularity within the bilateral lungs, worst in the lower lobes. Upper Abdomen: Hypodense right hepatic lobe lesion measures about 15 mm previously 23 mm. Lesion adjacent to the pancreatic tail measures 11 mm on series 2, image 57, previously 11 mm. Musculoskeletal: No acute cour or suspicious osseous abnormality. IMPRESSION: 1. Redemonstrated cavitary lesions in the right upper lobe and left  upper lobe. The right upper lobe lesion has slightly decreased in size while the left upper lobe lesion is stable. Interim development of heterogeneous consolidation in the right upper lobe anterior and superior to the cavitary mass, this may be secondary to superimposed acute infection. 2. Redemonstrated multiple pulmonary nodules consistent with metastatic disease, several of the nodules appear increased in size compared to prior. Scattered areas of peribronchovascular nodularity within the bilateral lungs, worst in the lower lobes, this could be infectious, inflammatory, or neoplastic in etiology. 3. Slight increased AP window nodes, reactive versus metastatic in etiology 4. Decreased hypodense liver lesion compared to prior. Stable 11 mm hyperdense nodule at the pancreatic tail 5. Aortic atherosclerosis. Aortic Atherosclerosis (ICD10-I70.0). Electronically Signed   By: Luke Bun M.D.   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  He  is status post right open radical nephrectomy in June 2023 followed by 9 cycles of treatment with immunotherapy with Keytruda  last dose was given May 21, 2022 before it was discontinued secondary to disease progression.  The patient then started systemic chemotherapy with nivolumab  and Cabometyx  initially at 40 mg p.o. daily but this was reduced to 20 mg p.o. daily before it was discontinued secondary to intolerance.  His treatment with nivolumab  was discontinued after 7 cycles secondary to disease progression.  The patient was seen by Dr. Joyice at [facility] cancer center for a second opinion and she recommended for the patient treatment with lenvatinib  and everolimus  starting at a reduced dose. He is currently  on treatment with lenvatinib  14 mg p.o. daily in addition to everolimus  5 mg p.o. daily started February 02, 2023.  He has been tolerating this treatment fairly well except for occasional diarrhea.  His treatment has been on hold since July 07, 2023 secondary to suspicious pulmonary infection/inflammatory process. He had repeat CT scan of the chest performed recently.  I personally independently reviewed the scan and discussed the result with the patient and his wife.  His scan showed redemonstration of the cavitary lesions in the right upper lobe and left upper lobe with the right upper lobe lesion slightly decreased in size.  There is also multiple pulmonary nodules consistent with metastatic disease several centimeters opdualag..  With the prior exam.  There was also slight increase in AP window lymph nodes could be reactive versus metastatic.  There is decrease in the hypodense liver lesion compared to the prior exam. The patient is currently on treatment with voriconazole  for pulmonary aspergillus. Assessment and Plan    Metastatic renal cell carcinoma Metastatic renal cell carcinoma with liver metastasis, initially diagnosed in February 2024. Previous treatment with lenvatinib  and everolimus  was on hold since March 2025 due to aspergillus pulmonary infection. Current treatment options are limited due to drug interactions with Voriconazole , which is required for the aspergillus infection. Axitinib (Inlyta) is considered as an alternative treatment option, as it has fewer interactions with fluconazole and can be administered at a reduced dose. The potential for increased toxicity or reduced efficacy due to drug interactions was discussed, but axitinib is expected to be effective and have fewer side effects compared to previous treatments. The decision to initiate axitinib was made after considering the lack of other viable options and the need to address cancer progression. - Initiate axitinib at a reduced  dose of 3 mg bid. - Arrange for pharmacist to educate on axitinib administration and interactions. - Monitor for side effects and efficacy in 2-3 weeks. - Consider resuming lenvatinib  and everolimus  in the future if needed.  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 The infection necessitates continuation of Voriconazole , which interacts with previous cancer treatments. The infection's status is monitored through scans and discussions with infectious disease specialists. The antigen for aspergillus was not detected in recent tests, possibly due to a good response to treatment. - Continue Voriconazole  treatment as per infectious disease specialist's recommendation. - Coordinate with pulmonologist and infectious disease specialist for ongoing management. - Monitor infection status with upcoming scans.   He was advised to call immediately if he has any concerning symptoms in the interval. The patient voices understanding of current disease status and treatment options and is in agreement with the current care plan.  All questions were answered. The patient knows to call the clinic with any problems, questions or concerns. We can  certainly see the patient much sooner if necessary.  The total time spent in the appointment was 35 minutes.  Disclaimer: This note was dictated with voice recognition software. Similar sounding words can inadvertently be transcribed and may not be corrected upon review.

## 2023-10-28 NOTE — Telephone Encounter (Signed)
 Oral Oncology Patient Advocate Encounter  Prior Authorization for Lanice has been approved.    PA# PA Case ID #: [id number] Effective dates: 10/28/23 through 10/27/24  Patients co-pay is $0.    Shelba Solomons, CPhT Specialty Pharmacy Patient Advocate Phone: [phone] Fax: [phone]

## 2023-10-28 NOTE — Telephone Encounter (Signed)
 Oral Oncology Pharmacist Encounter   Received notification that prior authorization for Inlyta is required.   PA submitted on CoverMyMeds Key: [id number] Status is pending   Oral Oncology Clinic will continue to follow.   Asberry Macintosh, PharmD, BCPS, BCOP Hematology/Oncology Clinical Pharmacist [facility] and [facility] Oral Chemotherapy Navigation Clinics [phone] 10/28/2023 12:17 PM

## 2023-10-28 NOTE — Progress Notes (Signed)
 Oral Chemotherapy Pharmacist Encounter  Patient was counseled under telephone encounter from 10/28/23.  Asberry Macintosh, PharmD, BCPS, BCOP Hematology/Oncology Clinical Pharmacist [facility] and [facility] Oral Chemotherapy Navigation Clinics [phone] 10/28/2023 3:10 PM

## 2023-10-28 NOTE — Addendum Note (Signed)
 Addended by: SHERROD SHERROD on: 10/28/2023 12:05 PM   Modules accepted: Orders

## 2023-10-29 ENCOUNTER — Other Ambulatory Visit: Payer: Self-pay

## 2023-10-29 NOTE — Progress Notes (Signed)
 Patient called back to see if he can pick up prescription sooner since he will return from his trip next week. Updated Rx to be ordered and fill on 6/30 for pickup instead of delivery. Patient intends to pick up by 7/1.

## 2023-11-01 ENCOUNTER — Other Ambulatory Visit (HOSPITAL_COMMUNITY): Payer: Self-pay

## 2023-11-01 ENCOUNTER — Other Ambulatory Visit: Payer: Self-pay | Admitting: Internal Medicine

## 2023-11-02 ENCOUNTER — Other Ambulatory Visit: Payer: Self-pay

## 2023-11-02 ENCOUNTER — Telehealth: Payer: Self-pay | Admitting: Pharmacist

## 2023-11-02 NOTE — Telephone Encounter (Signed)
 Oral Oncology Pharmacist Encounter  Received phone call from patient regarding confirmation of what specific anti-nausea medication and anti-diarrheal medication to have on hand when he starts Inlyta .  Patient currently has both prochlorperazine  and ondansetron  on hand at home for PRN use for N/V. Recommended that since patient is still on voriconazole , I would recommend use of prochlorperazine  for N/V PPX at this time instead of ondansetron  due to Qtc prolongation risk. Patient aware he can dose prochlorperazine  30-60 minutes prior to Inlyta  AM and PM doses to help decrease N/V.   Patient confirmed he has Imodium on hand at home to use if he develops diarrhea. Confirmed with patient that this is perfectly fine to take with Inlyta . He does know to let the office know if he has 4 or more episodes of diarrhea above baseline while using Imodium.   Patient informed me he plans on picking up Inlyta  today from [facility] and will start when he returns from his daugther's home on 11/03/23 (this is earlier than previously documented, but OK). Patient has appropriate 2 week follow up already scheduled with Dr. Sherrod on 11/17/23 which will be ~2 weeks after starting Inlyta .   Patient has no other questions or concerns at this time.   Asberry Macintosh, PharmD, BCPS, BCOP Hematology/Oncology Clinical Pharmacist [facility] and [facility] Oral Chemotherapy Navigation Clinics [phone] 11/02/2023 10:00 AM

## 2023-11-15 ENCOUNTER — Other Ambulatory Visit (HOSPITAL_COMMUNITY): Payer: Self-pay

## 2023-11-17 ENCOUNTER — Other Ambulatory Visit: Payer: Self-pay | Admitting: Pharmacy Technician

## 2023-11-17 ENCOUNTER — Inpatient Hospital Stay (HOSPITAL_BASED_OUTPATIENT_CLINIC_OR_DEPARTMENT_OTHER): Payer: PRIVATE HEALTH INSURANCE | Admitting: Internal Medicine

## 2023-11-17 ENCOUNTER — Other Ambulatory Visit: Payer: Self-pay

## 2023-11-17 ENCOUNTER — Inpatient Hospital Stay: Payer: PRIVATE HEALTH INSURANCE | Attending: Oncology

## 2023-11-17 VITALS — BP 128/77 | HR 73 | Temp 97.5°F | Resp 17 | Ht 70.0 in | Wt 117.0 lb

## 2023-11-17 DIAGNOSIS — Z905 Acquired absence of kidney: Secondary | ICD-10-CM | POA: Diagnosis not present

## 2023-11-17 DIAGNOSIS — C787 Secondary malignant neoplasm of liver and intrahepatic bile duct: Secondary | ICD-10-CM | POA: Diagnosis not present

## 2023-11-17 DIAGNOSIS — C641 Malignant neoplasm of right kidney, except renal pelvis: Secondary | ICD-10-CM | POA: Insufficient documentation

## 2023-11-17 DIAGNOSIS — C649 Malignant neoplasm of unspecified kidney, except renal pelvis: Secondary | ICD-10-CM

## 2023-11-17 LAB — CMP (CANCER CENTER ONLY)
ALT: 11 U/L (ref 0–44)
AST: 20 U/L (ref 15–41)
Albumin: 3.9 g/dL (ref 3.5–5.0)
Alkaline Phosphatase: 140 U/L — ABNORMAL HIGH (ref 38–126)
Anion gap: 5 (ref 5–15)
BUN: 25 mg/dL — ABNORMAL HIGH (ref 8–23)
CO2: 29 mmol/L (ref 22–32)
Calcium: 9.2 mg/dL (ref 8.9–10.3)
Chloride: 105 mmol/L (ref 98–111)
Creatinine: 1.3 mg/dL — ABNORMAL HIGH (ref 0.61–1.24)
GFR, Estimated: 56 mL/min — ABNORMAL LOW (ref >60)
Glucose, Bld: 94 mg/dL (ref 70–99)
Potassium: 4.2 mmol/L (ref 3.5–5.1)
Sodium: 139 mmol/L (ref 135–145)
Total Bilirubin: 0.3 mg/dL (ref 0.0–1.2)
Total Protein: 6.9 g/dL (ref 6.5–8.1)

## 2023-11-17 LAB — CBC WITH DIFFERENTIAL (CANCER CENTER ONLY)
Abs Immature Granulocytes: 0.02 K/uL (ref 0.00–0.07)
Basophils Absolute: 0.1 K/uL (ref 0.0–0.1)
Basophils Relative: 1 %
Eosinophils Absolute: 0.1 K/uL (ref 0.0–0.5)
Eosinophils Relative: 1 %
HCT: 40.2 % (ref 39.0–52.0)
Hemoglobin: 13.1 g/dL (ref 13.0–17.0)
Immature Granulocytes: 0 %
Lymphocytes Relative: 14 %
Lymphs Abs: 1 K/uL (ref 0.7–4.0)
MCH: 30.4 pg (ref 26.0–34.0)
MCHC: 32.6 g/dL (ref 30.0–36.0)
MCV: 93.3 fL (ref 80.0–100.0)
Monocytes Absolute: 0.7 K/uL (ref 0.1–1.0)
Monocytes Relative: 10 %
Neutro Abs: 5.5 K/uL (ref 1.7–7.7)
Neutrophils Relative %: 74 %
Platelet Count: 163 K/uL (ref 150–400)
RBC: 4.31 MIL/uL (ref 4.22–5.81)
RDW: 13.9 % (ref 11.5–15.5)
WBC Count: 7.4 K/uL (ref 4.0–10.5)
nRBC: 0 % (ref 0.0–0.2)

## 2023-11-17 NOTE — Progress Notes (Signed)
 Specialty Pharmacy Ongoing Clinical Assessment Note  Micheal Mcintyre is a 83 y.o. male who is being followed by the specialty pharmacy service for RxSp Oncology   Patient's specialty medication(s) reviewed today: Axitinib  (INLYTA )   Missed doses in the last 4 weeks: 0   Patient/Caregiver did not have any additional questions or concerns.   Therapeutic benefit summary: Unable to assess   Adverse events/side effects summary: Experienced adverse events/side effects (some insomnia, but tolerable at this time.)   Patient's therapy is appropriate to: Continue    Goals Addressed             This Visit's Progress    Slow Disease Progression       Patient is unable to be assessed as therapy was recently initiated. Patient will maintain adherence. Patient to have another scan on 11/19/23 to assess his condition.         Follow up: 3 months  [facility]

## 2023-11-17 NOTE — Progress Notes (Signed)
 Specialty Pharmacy Refill Coordination Note  Micheal Mcintyre is a 83 y.o. male contacted today regarding refills of specialty medication(s) Axitinib  (INLYTA )   Patient requested Delivery   Delivery date: 11/30/23   Verified address: [address]  [place] [address]   Medication will be filled on 11/29/23.

## 2023-11-17 NOTE — Progress Notes (Signed)
 [facility] [phone]   [phone]  OFFICE PROGRESS NOTE  [name], MD 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    PRIOR THERAPY: 1) Status post right open radical nephrectomy completed on October 15, 2021 by Dr. Donnice Siad.  2) Keytruda  200 Mg IV every 3 weeks started December 03, 2021.  Status post 9 cycles.  Last dose was given May 21, 2022.  This was discontinued secondary to disease progression. 3) Systemic treatment with nivolumab  480 Mg IV every 4 weeks in addition to Cabometyx  40 mg p.o. daily.  First dose June 18, 2022.  Status post 7 cycles.  His Cabometyx  has been on hold recently due to adverse side effects. His dose was reduced in May to 20 mg p.o. daily but he continues to have weakness, diarrhea, and dehydration. This has been on hold since 5/19.  Last dose was given December 02, 2022 discontinued secondary to disease progression. 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   CURRENT THERAPY: Axitinib  3 mg p.o. twice daily.  First dose started 11/03/2023.  INTERVAL HISTORY: NOAL ABSHIER 83 y.o. male returns to the clinic today for follow-up visit accompanied by his wife.Discussed the use of AI scribe software for clinical note transcription with the patient, who gave verbal consent to proceed.  History of Present Illness Micheal Mcintyre is an 83 year old male with metastatic papillary renal cell carcinoma who presents for treatment follow-up and evaluation.  He was diagnosed with metastatic papillary renal cell carcinoma with rhabdoid features  in February 2024 and has undergone several treatments, including immune therapy. Currently, he is on voriconazole  for pulmonary aspergillosis and started axitinib  3 mg PO BID at the end of July 2025.  Since starting axitinib , he handles the medication well with no major side effects. He experiences some insomnia, which he attributes to thinking about tasks he needs to complete. No chest pain, shortness of breath, or cough. He notes an improvement in his appetite and weight gain, which he is pleased about.  He is scheduled for a repeat scan this Friday to assess his condition.  He is also managing a cataract diagnosis and plans to see an eye doctor tomorrow. He is concerned about the potential impact of his cancer medications on his eyes but has not experienced any specific symptoms related to this.     MEDICAL HISTORY: Past Medical History:  Diagnosis Date   Arthritis    GERD (gastroesophageal reflux disease)    Hypertension    Hypothyroidism    Medicare annual wellness visit, subsequent 08/19/2014   Osteoarthritis 09/21/2007   Qualifier: Diagnosis of  By: Viviann Raddle MD, Marsha R    PONV (postoperative nausea and vomiting)    Preventative health care 06/07/2016   Right shoulder pain 12/03/2016    ALLERGIES:  is allergic to statins.  MEDICATIONS:  Current Outpatient Medications  Medication Sig Dispense Refill   acetaminophen  (TYLENOL ) 500 MG tablet Take 1,000 mg by mouth every 6 (six) hours as needed for moderate pain.     axitinib  (INLYTA )  1 MG tablet Take 3 tablets (3 mg total) by mouth 2 (two) times daily. 180 tablet 3   chlorthalidone (HYGROTON) 25 MG tablet Take 25 mg by mouth every other day.     furosemide (LASIX) 20 MG tablet Take 20 mg by mouth daily as needed.     levothyroxine  (SYNTHROID ) 50 MCG tablet TAKE 1 TABLET(50 MCG) BY MOUTH DAILY BEFORE BREAKFAST 30 tablet 1   Multiple Vitamin (MULTIVITAMIN WITH MINERALS) TABS tablet Take 1 tablet by mouth 4 (four) times a  week.     telmisartan  (MICARDIS ) 80 MG tablet TAKE 1/2 TABLET BY MOUTH TWICE DAILY 180 tablet 1   voriconazole  (VFEND ) 200 MG tablet Take 1 tablet (200 mg total) by mouth 2 (two) times daily. 60 tablet 3   No current facility-administered medications for this visit.    SURGICAL HISTORY:  Past Surgical History:  Procedure Laterality Date   BRONCHIAL BIOPSY  08/17/2023   Procedure: BRONCHOSCOPY, WITH BIOPSY;  Surgeon: Shelah Lamar RAMAN, MD;  Location: [facility] ENDOSCOPY;  Service: Pulmonary;;   BRONCHIAL BRUSHINGS  08/17/2023   Procedure: BRONCHOSCOPY, WITH BRUSH BIOPSY;  Surgeon: Shelah Lamar RAMAN, MD;  Location: [facility] ENDOSCOPY;  Service: Pulmonary;;   BRONCHIAL NEEDLE ASPIRATION BIOPSY  08/17/2023   Procedure: BRONCHOSCOPY, WITH NEEDLE ASPIRATION BIOPSY;  Surgeon: Shelah Lamar RAMAN, MD;  Location: [facility] ENDOSCOPY;  Service: Pulmonary;;   COLONOSCOPY     HIP SURGERY Left    1991   JOINT REPLACEMENT Right 2005   hip   NEPHRECTOMY Right 10/15/2021   Procedure: NEPHRECTOMY, OPEN;  Surgeon: Selma Donnice SAUNDERS, MD;  Location: [facility] ORS;  Service: Urology;  Laterality: Right;   nose skin cancer removal      REVIEW OF SYSTEMS:  A comprehensive review of systems was negative.   PHYSICAL EXAMINATION: General appearance: alert, cooperative, fatigued, and no distress Head: Normocephalic, without obvious abnormality, atraumatic Neck: no adenopathy, no JVD, supple, symmetrical, trachea midline, and thyroid  not enlarged, symmetric, no tenderness/mass/nodules Lymph nodes: Cervical, supraclavicular, and axillary nodes normal. Resp: clear to auscultation bilaterally Back: symmetric, no curvature. ROM normal. No CVA tenderness. Cardio: regular rate and rhythm, S1, S2 normal, no murmur, click, rub or gallop GI: soft, non-tender; bowel sounds normal; no masses,  no organomegaly Extremities: extremities normal, atraumatic, no cyanosis or edema  ECOG PERFORMANCE STATUS: 1 - Symptomatic but completely ambulatory  Blood  pressure 128/77, pulse 73, temperature (!) 97.5 F (36.4 C), temperature source Temporal, resp. rate 17, height 5' 10 (1.778 m), weight 117 lb (53.1 kg), SpO2 100%.  LABORATORY DATA: Lab Results  Component Value Date   WBC 7.4 11/17/2023   HGB 13.1 11/17/2023   HCT 40.2 11/17/2023   MCV 93.3 11/17/2023   PLT 163 11/17/2023      Chemistry      Component Value Date/Time   NA 141 10/28/2023 1054   NA 140 04/22/2022 0000   K 4.3 10/28/2023 1054   CL 105 10/28/2023 1054   CO2 31 10/28/2023 1054   BUN 23 10/28/2023 1054   BUN 21 04/22/2022 0000   CREATININE 1.33 (H) 10/28/2023 1054   CREATININE 1.09 02/06/2020 1306   GLU 88 04/22/2022 0000      Component Value Date/Time   CALCIUM  9.2 10/28/2023 1054   ALKPHOS 106 10/28/2023 1054   AST 14 (L) 10/28/2023 1054   ALT 6 10/28/2023 1054   BILITOT 0.3 10/28/2023 1054       RADIOGRAPHIC STUDIES: No results found.    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  He is status post right open radical nephrectomy in June 2023 followed by 9 cycles of treatment with immunotherapy with Keytruda  last dose was given May 21, 2022 before it was discontinued secondary to disease progression.  The patient then started systemic chemotherapy with nivolumab  and Cabometyx  initially at 40 mg p.o. daily but this was reduced to 20 mg p.o. daily before it was discontinued secondary to intolerance.  His treatment with nivolumab  was discontinued after 7 cycles secondary to disease progression.  The patient was seen by Dr. Joyice at [facility] cancer center for a second opinion and she recommended for the patient treatment with lenvatinib  and everolimus  starting at a reduced dose. He is currently on treatment with  lenvatinib  14 mg p.o. daily in addition to everolimus  5 mg p.o. daily started February 02, 2023.  He has been tolerating this treatment fairly well except for occasional diarrhea.  His treatment has been on hold since July 07, 2023 secondary to suspicious pulmonary infection/inflammatory process. He had repeat CT scan of the chest performed recently.  I personally independently reviewed the scan and discussed the result with the patient and his wife.  His scan showed redemonstration of the cavitary lesions in the right upper lobe and left upper lobe with the right upper lobe lesion slightly decreased in size.  There is also multiple pulmonary nodules consistent with metastatic disease several centimeters opdualag..  With the prior exam.  There was also slight increase in AP window lymph nodes could be reactive versus metastatic.  There is decrease in the hypodense liver lesion compared to the prior exam. The patient is currently on treatment with voriconazole  for pulmonary aspergillus. He is currently undergoing treatment with axitinib  3 mg p.o. twice daily started November 03, 2023 and tolerated it fairly well. 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 Currently on axitinib  3 mg BID since early July 2025. No major side effects reported, though experiencing some insomnia. Blood work shows normal CBC and improved anemia. Appetite is good, and weight is up. No chest pain, shortness of breath, or cough. Reduced axitinib  dose due to interaction with voriconazole , with potential to increase dose post-voriconazole . - Continue axitinib  3 mg BID - Repeat blood work in two weeks - Evaluate scan results scheduled for Friday - Consider increasing axitinib  dose after completion of voriconazole   Pulmonary aspergillosis Currently managed with voriconazole . No respiratory  symptoms such as chest pain, shortness of breath, or cough reported. - Continue voriconazole   Cataract Diagnosed with cataract and scheduled to see an ophthalmologist. No need to alter chemotherapy regimen for cataract surgery. If surgery is needed, chemotherapy can be held for a day and resumed. - Consult with ophthalmologist regarding cataract - Proceed with cataract surgery if needed without altering chemotherapy regimen  The patient voices understanding of current disease status and treatment options and is in agreement with the current care plan.  All questions were answered. The patient knows to call the clinic with any problems, questions or concerns. We can certainly see the patient much sooner if necessary.  The total time spent in the appointment was 30 minutes.  Disclaimer: This note was dictated  with voice recognition software. Similar sounding words can inadvertently be transcribed and may not be corrected upon review.

## 2023-11-18 DIAGNOSIS — H25813 Combined forms of age-related cataract, bilateral: Secondary | ICD-10-CM | POA: Diagnosis not present

## 2023-11-18 DIAGNOSIS — D3131 Benign neoplasm of right choroid: Secondary | ICD-10-CM | POA: Diagnosis not present

## 2023-11-18 DIAGNOSIS — H35363 Drusen (degenerative) of macula, bilateral: Secondary | ICD-10-CM | POA: Diagnosis not present

## 2023-11-19 ENCOUNTER — Ambulatory Visit (HOSPITAL_BASED_OUTPATIENT_CLINIC_OR_DEPARTMENT_OTHER)
Admission: RE | Admit: 2023-11-19 | Discharge: 2023-11-19 | Disposition: A | Discharge status: Home / Self Care | Source: Ambulatory Visit | Attending: Acute Care | Admitting: Acute Care

## 2023-11-19 DIAGNOSIS — R918 Other nonspecific abnormal finding of lung field: Secondary | ICD-10-CM | POA: Diagnosis not present

## 2023-11-19 DIAGNOSIS — R911 Solitary pulmonary nodule: Secondary | ICD-10-CM | POA: Diagnosis not present

## 2023-11-19 DIAGNOSIS — I700 Atherosclerosis of aorta: Secondary | ICD-10-CM | POA: Diagnosis not present

## 2023-11-19 DIAGNOSIS — J984 Other disorders of lung: Secondary | ICD-10-CM | POA: Diagnosis not present

## 2023-11-23 DIAGNOSIS — N189 Chronic kidney disease, unspecified: Secondary | ICD-10-CM | POA: Diagnosis not present

## 2023-11-23 DIAGNOSIS — D49511 Neoplasm of unspecified behavior of right kidney: Secondary | ICD-10-CM | POA: Diagnosis not present

## 2023-11-25 ENCOUNTER — Encounter: Payer: Self-pay | Admitting: Internal Medicine

## 2023-11-25 ENCOUNTER — Other Ambulatory Visit: Payer: Self-pay

## 2023-11-25 ENCOUNTER — Ambulatory Visit (INDEPENDENT_AMBULATORY_CARE_PROVIDER_SITE_OTHER): Admitting: Internal Medicine

## 2023-11-25 VITALS — BP 134/72 | HR 64 | Temp 97.3°F | Ht 70.0 in | Wt 118.0 lb

## 2023-11-25 DIAGNOSIS — B440 Invasive pulmonary aspergillosis: Secondary | ICD-10-CM

## 2023-11-25 MED ORDER — VORICONAZOLE 200 MG PO TABS: 200.0000 mg | ORAL_TABLET | Freq: Two times a day (BID) | ORAL | 3 refills | Status: DC

## 2023-11-25 NOTE — Patient Instructions (Signed)
 Everything looking great  Continue antifungal medication for now, in 4 weeks if ct stable/looks better will stop the antifungal   Labs today Chest ct in 4 weeks   See me in 6 weeks

## 2023-11-25 NOTE — Progress Notes (Signed)
 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   CRI (chronic renal insufficiency) 03/30/2022   Cancer of kidney (HCC) 11/18/2021   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   Acquired leg length discrepancy 07/15/2021   Lumbar radiculopathy 07/15/2021   Other idiopathic scoliosis, 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/15/2021   Muscle cramps 07/08/2018   Sun-damaged skin 12/06/2016   Right shoulder pain 12/03/2016   Preventative health care 06/07/2016   Vitamin D  deficiency 06/02/2015   Medicare annual wellness visit, subsequent 08/19/2014   Hyperglycemia 05/25/2012   Hypothyroidism 03/14/2009   Anemia 03/14/2009   FREQUENCY, URINARY 03/14/2009   UNS ADVRS EFF OTH RX MEDICINAL&BIOLOGICAL SBSTNC 03/14/2009   ERECTILE DYSFUNCTION 10/19/2007   Hyperlipidemia, mixed 09/21/2007   Osteoarthritis 09/21/2007   Essential hypertension 01/12/2007   GERD 01/12/2007      HPI: Micheal Mcintyre is a 83 y.o. male with renal cancer s/p right nephrectomy 2023, subsequent metastatic to lung/liver, on chemo (everolimus  and lenvatinib  tyrsine kinase inhibitor -- since 03/2023), referred to us  due to bronch culture with aspergillus  Patient has had ct scan 06/30/23 for cancer monitoring. There was left upper lobe and left upper lobe cavirary lesion. Patient was coughing (no hemoptysis, fever, chill, nightsweat, fatigue, flulike illness).   Repeat 08/09/23 ct chest showed enlarged  and increased numbers/increased size of cavitation  He was sent to pulm for bronch 08/17/2023. Biopsies were negative for malignancy but culture was positive for aspergillus  Patient was taken off chemo in 07/2023 and started on vori  in August 17, 2023  Patient doesn't recall feeling sick in anyway except when he was on cancer treatment  He last saw pulm 09/24/23. 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 Hx right hip replacement 2005  Walks with a cane  He feels really well off chemo   11/25/23 id clinic f/u Patient here with wife No concern today Reviewed labs/imaging See a&p for detail    Review of Systems: ROS All other ros negative      Past Medical History:  Diagnosis Date   Arthritis    GERD (gastroesophageal reflux disease)    Hypertension    Hypothyroidism    Medicare annual wellness visit, subsequent 08/19/2014   Osteoarthritis 09/21/2007   Qualifier: Diagnosis of  By: Viviann Raddle MD, Marsha R    PONV (postoperative nausea and vomiting)    Preventative health care 06/07/2016   Right shoulder pain 12/03/2016    Social History   Tobacco Use   Smoking status: Never    Passive exposure: Never   Smokeless tobacco: Never  Vaping Use   Vaping status: Never Used  Substance Use Topics   Alcohol use: Not Currently    Comment: occ beer   Drug use: No    Family History  Problem Relation Age of Onset   Heart attack Father 49       deceased   Diabetes Maternal Uncle        maternal  grandfather   Hypertension Neg Hx    Breast cancer Neg Hx    Colon cancer Neg Hx    Prostate cancer Neg Hx     Allergies  Allergen Reactions   Statins Other (See Comments)    myalgia    OBJECTIVE: There were no vitals filed for this visit.  There is no height or weight on file to calculate BMI.   Physical Exam General/constitutional: no distress, pleasant HEENT: Normocephalic, PER, Conj Clear, EOMI, Oropharynx clear Neck supple CV: rrr no mrg Lungs: clear to auscultation, normal respiratory effort Abd: Soft,  Nontender Ext: no edema Skin: No Rash Neuro: nonfocal MSK: walks with cane; slightly shorter left lower ext  Lab: Lab Results  Component Value Date   WBC 7.4 11/17/2023   HGB 13.1 11/17/2023   HCT 40.2 11/17/2023   MCV 93.3 11/17/2023   PLT 163 11/17/2023   Last metabolic panel Lab Results  Component Value Date   GLUCOSE 94 11/17/2023   NA 139 11/17/2023   K 4.2 11/17/2023   CL 105 11/17/2023   CO2 29 11/17/2023   BUN 25 (H) 11/17/2023   CREATININE 1.30 (H) 11/17/2023   GFRNONAA 56 (L) 11/17/2023   CALCIUM  9.2 11/17/2023   PHOS 3.4 05/19/2013   PROT 6.9 11/17/2023   ALBUMIN  3.9 11/17/2023   BILITOT 0.3 11/17/2023   ALKPHOS 140 (H) 11/17/2023   AST 20 11/17/2023   ALT 11 11/17/2023   ANIONGAP 5 11/17/2023    Microbiology:  Serology: 10/14/23 aspergillus Ag negative    Imaging: Reviewed   09/07/23 ct chest CT Chest 09/07/2023 Interval decrease in size of the 2.7 cm pleural-based mass with partial cavitation laterally in the right upper lobe. 2. Interval decrease in size of the 3.2 cm cavitary lesion laterally in the left upper lobe. 3. Interval new 1.2 cm triangular nodule or nodular consolidation in the left upper lobe. 4. Interval decrease in size of the previous central ground-glass and peripheral rim consolidation in the posterior left lower lobe, now more confluent and homogeneous consolidation. 5. Innumerable bilateral pulmonary nodules, some of which are associated with peribronchovascular tree-in-bud interstitial change, and therefore likely inflammatory but majority are almost certainly metastatic. 6. Index nodules are slightly larger in the interval. 7. Hypodense presumably metastases in the liver are slightly smaller in the interval. 8. 1.1 cm hyperdense lesion in the pancreatic tail is unchanged, possible metastasis. 9. Aortic atherosclerosis.    Assessment/plan: Problem List Items Addressed This Visit   None Visit Diagnoses        Invasive pulmonary aspergillosis (HCC)    -  Primary   Relevant Orders   CBC w/Diff   COMPLETE METABOLIC PANEL WITHOUT GFR   Aspergillus Antigen, Serum   CT CHEST WO CONTRAST          Immunosuppressed (everolimus  and and lenvima ) Renal cancer Pulm aspergillus  Surprised to see he has aspergillus with these 2 cancer drugs Bx no malignancy but culture positive and worsening chest ct prior to vori, improved on it  I Ariel't have any pretreatment aspergillus ag   Will track aspergillus ag Cbc/cmp already done At least 3 months vori Agree repeat chest ct in 4-6 weeks  F/u wth me in 6 weeks  If urgent treatment for cancer needed, no contraindication from my standpoint as long as voriconazole  is continued   ------------- 11/25/23 id clinic assessment Patient remains assymptomatic from respiratory symptoms. No cough/chest pain  CT chest to my read showed improved/resolved parenchymal air-space disease  and the fungal ball seems smaller as well   He has no pre-antibiotics galactomannan. But on antifungal serum aspergillus ag was negative   I have high suspicion that the infection is treated but will repeat testing/ct scan again in around 4-6 weeks if stable or improved will stop antifungal  Of note, the fungal ball might never go away  He has no hair loss, skin rash, n/v, visual problems and given clinical status will not need voriconazole  level today  Continue voriconazole  200 mg bid for now     Follow-up: Return in about 6 weeks (around 01/06/2024).  Constance ONEIDA Passer, MD [facility] 11/25/2023, 10:48 AM

## 2023-11-27 LAB — CBC WITH DIFFERENTIAL/PLATELET
Absolute Lymphocytes: 1037 {cells}/uL (ref 850–3900)
Absolute Monocytes: 666 {cells}/uL (ref 200–950)
Basophils Absolute: 51 {cells}/uL (ref 0–200)
Basophils Relative: 0.8 %
Eosinophils Absolute: 128 {cells}/uL (ref 15–500)
Eosinophils Relative: 2 %
HCT: 43.3 % (ref 38.5–50.0)
Hemoglobin: 13.7 g/dL (ref 13.2–17.1)
MCH: 30 pg (ref 27.0–33.0)
MCHC: 31.6 g/dL — ABNORMAL LOW (ref 32.0–36.0)
MCV: 94.7 fL (ref 80.0–100.0)
MPV: 11.2 fL (ref 7.5–12.5)
Monocytes Relative: 10.4 %
Neutro Abs: 4518 {cells}/uL (ref 1500–7800)
Neutrophils Relative %: 70.6 %
Platelets: 167 Thousand/uL (ref 140–400)
RBC: 4.57 Million/uL (ref 4.20–5.80)
RDW: 12.7 % (ref 11.0–15.0)
Total Lymphocyte: 16.2 %
WBC: 6.4 Thousand/uL (ref 3.8–10.8)

## 2023-11-27 LAB — COMPLETE METABOLIC PANEL WITHOUT GFR
AG Ratio: 1.4 (calc) (ref 1.0–2.5)
ALT: 14 U/L (ref 9–46)
AST: 21 U/L (ref 10–35)
Albumin: 4.2 g/dL (ref 3.6–5.1)
Alkaline phosphatase (APISO): 170 U/L — ABNORMAL HIGH (ref 35–144)
BUN/Creatinine Ratio: 19 (calc) (ref 6–22)
BUN: 28 mg/dL — ABNORMAL HIGH (ref 7–25)
CO2: 31 mmol/L (ref 20–32)
Calcium: 9.8 mg/dL (ref 8.6–10.3)
Chloride: 104 mmol/L (ref 98–110)
Creat: 1.5 mg/dL — ABNORMAL HIGH (ref 0.70–1.22)
Globulin: 3 g/dL (ref 1.9–3.7)
Glucose, Bld: 80 mg/dL (ref 65–99)
Potassium: 5.1 mmol/L (ref 3.5–5.3)
Sodium: 141 mmol/L (ref 135–146)
Total Bilirubin: 0.4 mg/dL (ref 0.2–1.2)
Total Protein: 7.2 g/dL (ref 6.1–8.1)

## 2023-11-27 LAB — ASPERGILLUS ANTIGEN,SERUM
Aspergillus Ag, EIA: NOT DETECTED
Index Value: 0.05 (ref <0.50)

## 2023-11-28 NOTE — Progress Notes (Unsigned)
 [facility] OFFICE PROGRESS NOTE  [name], MD 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   PRIOR THERAPY: 1) Status post right open radical nephrectomy completed on October 15, 2021 by Dr. Donnice Siad.  2) Keytruda  200 Mg IV every 3 weeks started December 03, 2021.  Status post 9 cycles.  Last dose was given May 21, 2022.  This was discontinued secondary to disease progression. 3) Systemic treatment with nivolumab  480 Mg IV every 4 weeks in addition to Cabometyx  40 mg p.o. daily.  First dose June 18, 2022.  Status post 7 cycles.  His Cabometyx  has been on hold recently due to adverse side effects. His dose was reduced in May to 20 mg p.o. daily but he continues to have weakness, diarrhea, and dehydration. This has been on hold since 5/19.  Last dose was given December 02, 2022 discontinued secondary to disease progression. 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  CURRENT THERAPY: Axitinib  3 mg p.o. twice daily.  First dose started 11/03/2023.   INTERVAL HISTORY: Micheal Mcintyre 83 y.o. male returns to the clinic today for a follow up visit. The patient was last seen in the clinic on 11/17/23. In summary, the patient was previously on treatment with lenvatinib  plus everolimus . This was on hold for a few months while he underwent treatment for pulmonary aspergillus. He is on treatment with voriconazole . He saw infectious disease recently on 11/25/23 and they recommended cotninued   Sees infectious disease due to pulm aspergillus. They agreed to repeat CT CT in 4-6 weeks and continue on  voriconazole .   The patient resumed treatment fro his renal cell carcinoma on 11/03/23 with Inlyta . The patient denies any concerning adverse side effects.   He denies fevers, chills, night sweats, nausea, vomiting, or coughing up blood. He reports occasional constipation, for which he takes Colace. His breathing is ***. Shortness of breath? He denies unexplained weight loss. His appetite is **. He is here for evaluation and repeat blood work.     MEDICAL HISTORY: Past Medical History:  Diagnosis Date   Arthritis    GERD (gastroesophageal reflux disease)    Hypertension    Hypothyroidism    Medicare annual wellness visit, subsequent 08/19/2014   Osteoarthritis 09/21/2007   Qualifier: Diagnosis of  By: Viviann Raddle MD, Marsha R    PONV (postoperative nausea and vomiting)    Preventative health care 06/07/2016   Right shoulder pain 12/03/2016    ALLERGIES:  is allergic to statins.  MEDICATIONS:  Current Outpatient Medications  Medication Sig Dispense Refill   acetaminophen  (TYLENOL ) 500 MG tablet Take 1,000 mg by mouth every 6 (six) hours as needed for moderate pain.     axitinib  (INLYTA ) 1 MG tablet Take 3 tablets (3 mg total) by mouth 2 (two) times daily. 180 tablet 3   chlorthalidone (HYGROTON) 25 MG tablet Take 25 mg by mouth every other day.     furosemide (LASIX) 20 MG tablet Take 20 mg by mouth daily as needed.     levothyroxine  (SYNTHROID ) 50 MCG tablet TAKE 1 TABLET(50 MCG) BY  MOUTH DAILY BEFORE BREAKFAST 30 tablet 1   Multiple Vitamin (MULTIVITAMIN WITH MINERALS) TABS tablet Take 1 tablet by mouth 4 (four) times a week.     telmisartan  (MICARDIS ) 80 MG tablet TAKE 1/2 TABLET BY MOUTH TWICE DAILY 180 tablet 1   voriconazole  (VFEND ) 200 MG tablet Take 1 tablet (200 mg total) by mouth 2 (two) times daily. 60 tablet 3   No current facility-administered medications for this visit.    SURGICAL HISTORY:  Past Surgical History:  Procedure Laterality Date   BRONCHIAL BIOPSY   08/17/2023   Procedure: BRONCHOSCOPY, WITH BIOPSY;  Surgeon: Shelah Lamar RAMAN, MD;  Location: [facility] ENDOSCOPY;  Service: Pulmonary;;   BRONCHIAL BRUSHINGS  08/17/2023   Procedure: BRONCHOSCOPY, WITH BRUSH BIOPSY;  Surgeon: Shelah Lamar RAMAN, MD;  Location: [facility] ENDOSCOPY;  Service: Pulmonary;;   BRONCHIAL NEEDLE ASPIRATION BIOPSY  08/17/2023   Procedure: BRONCHOSCOPY, WITH NEEDLE ASPIRATION BIOPSY;  Surgeon: Shelah Lamar RAMAN, MD;  Location: [facility] ENDOSCOPY;  Service: Pulmonary;;   COLONOSCOPY     HIP SURGERY Left    1991   JOINT REPLACEMENT Right 2005   hip   NEPHRECTOMY Right 10/15/2021   Procedure: NEPHRECTOMY, OPEN;  Surgeon: Selma Donnice SAUNDERS, MD;  Location: [facility] ORS;  Service: Urology;  Laterality: Right;   nose skin cancer removal      REVIEW OF SYSTEMS:   Review of Systems  Constitutional: Negative for appetite change, chills, fatigue, fever and unexpected weight change.  HENT:   Negative for mouth sores, nosebleeds, sore throat and trouble swallowing.   Eyes: Negative for eye problems and icterus.  Respiratory: Negative for cough, hemoptysis, shortness of breath and wheezing.   Cardiovascular: Negative for chest pain and leg swelling.  Gastrointestinal: Negative for abdominal pain, constipation, diarrhea, nausea and vomiting.  Genitourinary: Negative for bladder incontinence, difficulty urinating, dysuria, frequency and hematuria.   Musculoskeletal: Negative for back pain, gait problem, neck pain and neck stiffness.  Skin: Negative for itching and rash.  Neurological: Negative for dizziness, extremity weakness, gait problem, headaches, light-headedness and seizures.  Hematological: Negative for adenopathy. Does not bruise/bleed easily.  Psychiatric/Behavioral: Negative for confusion, depression and sleep disturbance. The patient is not nervous/anxious.     PHYSICAL EXAMINATION:  There were no vitals taken for this visit.  ECOG PERFORMANCE STATUS: {CHL ONC ECOG [id number]  Physical Exam   Constitutional: Oriented to person, place, and time and well-developed, well-nourished, and in no distress. No distress.  HENT:  Head: Normocephalic and atraumatic.  Mouth/Throat: Oropharynx is clear and moist. No oropharyngeal exudate.  Eyes: Conjunctivae are normal. Right eye exhibits no discharge. Left eye exhibits no discharge. No scleral icterus.  Neck: Normal range of motion. Neck supple.  Cardiovascular: Normal rate, regular rhythm, normal heart sounds and intact distal pulses.   Pulmonary/Chest: Effort normal and breath sounds normal. No respiratory distress. No wheezes. No rales.  Abdominal: Soft. Bowel sounds are normal. Exhibits no distension and no mass. There is no tenderness.  Musculoskeletal: Normal range of motion. Exhibits no edema.  Lymphadenopathy:    No cervical adenopathy.  Neurological: Alert and oriented to person, place, and time. Exhibits normal muscle tone. Gait normal. Coordination normal.  Skin: Skin is warm and dry. No rash noted. Not diaphoretic. No erythema. No pallor.  Psychiatric: Mood, memory and judgment normal.  Vitals reviewed.  LABORATORY DATA: Lab Results  Component Value Date   WBC 6.4 11/25/2023   HGB 13.7 11/25/2023   HCT 43.3 11/25/2023   MCV 94.7 11/25/2023  PLT 167 11/25/2023      Chemistry      Component Value Date/Time   NA 141 11/25/2023 1124   NA 140 04/22/2022 0000   K 5.1 11/25/2023 1124   CL 104 11/25/2023 1124   CO2 31 11/25/2023 1124   BUN 28 (H) 11/25/2023 1124   BUN 21 04/22/2022 0000   CREATININE 1.50 (H) 11/25/2023 1124   GLU 88 04/22/2022 0000      Component Value Date/Time   CALCIUM  9.8 11/25/2023 1124   ALKPHOS 140 (H) 11/17/2023 1105   AST 21 11/25/2023 1124   AST 20 11/17/2023 1105   ALT 14 11/25/2023 1124   ALT 11 11/17/2023 1105   BILITOT 0.4 11/25/2023 1124   BILITOT 0.3 11/17/2023 1105       RADIOGRAPHIC STUDIES:  CT CHEST WO CONTRAST Result Date: 11/26/2023 CLINICAL DATA:  Lung nodule, 6-79mm  EXAM: CT CHEST WITHOUT CONTRAST TECHNIQUE: Multidetector CT imaging of the chest was performed following the standard protocol without IV contrast. RADIATION DOSE REDUCTION: This exam was performed according to the departmental dose-optimization program which includes automated exposure control, adjustment of the mA and/or kV according to patient size and/or use of iterative reconstruction technique. COMPARISON:  10/08/2023 FINDINGS: Cardiovascular: Heart is normal size. Aorta is normal caliber. Scattered aortic calcifications. Mediastinum/Nodes: No mediastinal, hilar, or axillary adenopathy. Trachea and esophagus are unremarkable. Thyroid  unremarkable. Lungs/Pleura: Thin walled cavitary lesion in the upper pole on the left measures 3.9 x 3.2 cm compared to 3.5 x 2.6 cm previously. The walls now are thin without associated soft tissue/nodularity. Cavitary area in the right upper lobe also has decreasing soft tissue component with overall cavity measuring 2.0 x 1.8 cm compared to 2.5 x 2.2 cm previously. Surrounding airspace disease has decreased. Extensive nodularity noted throughout both lungs compatible with metastases. Index left lower lobe pulmonary nodule measures up to 1.3 cm compared to 1.2 cm previously. Index right lower lobe pulmonary nodule measures up to 7 mm compared to 7 mm previously. There are tree-in-bud nodular densities throughout both lungs most likely infectious/inflammatory small airways disease. These are not significantly changed. No effusions. Upper Abdomen: Scattered low-density areas in the liver again noted concerning for metastases. Anterior right lobe lesion measures 1.7 cm compared to 1.5 cm previously. Inferiorly in the right hepatic lobe an ill-defined low-density area measures 2.3 cm, not imaged on prior study. Musculoskeletal: Chest wall soft tissues are unremarkable. No acute bony abnormality or focal suspicious lesion. IMPRESSION: Cavitary areas again noted in the upper lobes  bilaterally with decreasing nodularity and soft tissue components as well as decreasing airspace disease surrounding the right upper lobe cavity. Tree-in-bud nodular densities throughout the lungs most compatible with infectious or inflammatory small airways disease. Numerous bilateral pulmonary nodules measuring up to 1.3 cm in the left lower lobe compatible with metastases. Overall, nodules appear essentially stable since prior study. Low-density areas within the liver concerning for metastases. These would be better visualized, evaluated and followed with abdominal CT with IV contrast. Aortic Atherosclerosis (ICD10-I70.0). Electronically Signed   By: Franky Crease M.D.   On: 11/26/2023 19:59     ASSESSMENT/PLAN:  This is a very pleasant 83 year old Caucasian male diagnosed with metastatic papillary renal cell carcinoma.  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  This was diagnosed in June 2023.  He is status post a right open radical nephrectomy.  Patient was found to have metastatic disease to the  liver and bone in February 2024.   The patient had previously underwent adjuvant treatment with Keytruda  20 mg IV every 3 weeks for 9 cycles.   When he saw Dr. Sherrod on 06/11/2022 and reviewed his MRI with him which showed multiple new rim-enhancing lesions throughout the liver consistent metastatic disease.  There was also a rim-enhancing lesion in the inferior endplate of L1 measuring 2.1 x 1.1 cm concerning for metastatic disease.  There is no local recurrence or lymphadenopathy. He also had a chest CT by Dr. Selma which showed pulmonary metastatic disease.    Dr. Sherrod recommended that the patient start treatment with nivolumab  480 mg IV every 4 weeks in addition to Cabometyx  40 mg grams p.o. daily.  He started this on 06/18/2022. He is status post 7 cycles of nivolumab .   He then had GI side effects of cabometyx  with diarrhea and  weight loss. His treatment has periodically been on hold. His dose was reduced to 20 mg in May 2024, but he continued to have intolerance. Therefore, this has been on hold since 5/19. His diarrhea and weight loss have since resolved.    Unfortunately, the patient's restaging CT scan from August showed disease progression.   The patient saw Dr. Joyice at [facility] for second opinion.  She recommended lenvatinib  plus everolimus .  She recommended starting at a small dose reduction of 14 mg in standard dose everolimus  standard dose 5 mg given his prior issues with cabo. If tolerates very well, could increase lenvatinib .  He had progression in the future, then she would recommend clinical trial if available versus axitinib . Therefore, he started this on 01/28/23. He has had some mouth sores, thrombocytopenia, mild nausea, hypertension, and sensitivity on the bottom of his feet.   His treatment has been on hold since 07/06/33.    Imaging at that time showed concern for atypical infection.   He is being treated with voriconazole  due to pulmonary aspergillus and following with infectious disease.   The patient started treatment for his malignancy with Inlyta  on 11/03/23 and thus far has tolerated ***.   The patient was seen with Dr. Sherrod today. Labs were reviewed. Recommend he *** on the same treatment at the same dose.   We will see him back for labs and follow up visit in 1 month.   He will continue to follow with infectious disease.   He is scheduled for repeat CT scan on 12/27/23. We will also use these results to assess his response to treatment.   The patient was advised to call immediately if she has any concerning symptoms in the interval. The patient voices understanding of current disease status and treatment options and is in agreement with the current care plan. All questions were answered. The patient knows to call the clinic with any problems, questions or concerns. We can certainly see the  patient much sooner if necessary          No orders of the defined types were placed in this encounter.    I spent {CHL ONC TIME VISIT - DTPQU:[id number]} counseling the patient face to face. The total time spent in the appointment was {CHL ONC TIME VISIT - DTPQU:[id number]}.  Ashtan Laton L Shamila Lerch, PA-C 11/28/23

## 2023-11-29 ENCOUNTER — Other Ambulatory Visit: Payer: Self-pay

## 2023-11-29 DIAGNOSIS — H25812 Combined forms of age-related cataract, left eye: Secondary | ICD-10-CM | POA: Diagnosis not present

## 2023-11-30 ENCOUNTER — Other Ambulatory Visit: Payer: Self-pay | Admitting: Physician Assistant

## 2023-11-30 DIAGNOSIS — C649 Malignant neoplasm of unspecified kidney, except renal pelvis: Secondary | ICD-10-CM

## 2023-12-01 ENCOUNTER — Inpatient Hospital Stay (HOSPITAL_BASED_OUTPATIENT_CLINIC_OR_DEPARTMENT_OTHER): Payer: PRIVATE HEALTH INSURANCE | Admitting: Physician Assistant

## 2023-12-01 ENCOUNTER — Inpatient Hospital Stay: Payer: PRIVATE HEALTH INSURANCE

## 2023-12-01 VITALS — BP 150/84 | HR 66 | Temp 97.4°F | Resp 18 | Ht 70.0 in

## 2023-12-01 DIAGNOSIS — C641 Malignant neoplasm of right kidney, except renal pelvis: Secondary | ICD-10-CM

## 2023-12-01 DIAGNOSIS — Z5112 Encounter for antineoplastic immunotherapy: Secondary | ICD-10-CM | POA: Diagnosis not present

## 2023-12-01 DIAGNOSIS — C649 Malignant neoplasm of unspecified kidney, except renal pelvis: Secondary | ICD-10-CM

## 2023-12-01 DIAGNOSIS — Z905 Acquired absence of kidney: Secondary | ICD-10-CM | POA: Diagnosis not present

## 2023-12-01 DIAGNOSIS — C787 Secondary malignant neoplasm of liver and intrahepatic bile duct: Secondary | ICD-10-CM | POA: Diagnosis not present

## 2023-12-01 LAB — CMP (CANCER CENTER ONLY)
ALT: 15 U/L (ref 0–44)
AST: 21 U/L (ref 15–41)
Albumin: 4 g/dL (ref 3.5–5.0)
Alkaline Phosphatase: 165 U/L — ABNORMAL HIGH (ref 38–126)
Anion gap: 5 (ref 5–15)
BUN: 27 mg/dL — ABNORMAL HIGH (ref 8–23)
CO2: 30 mmol/L (ref 22–32)
Calcium: 9.3 mg/dL (ref 8.9–10.3)
Chloride: 105 mmol/L (ref 98–111)
Creatinine: 1.39 mg/dL — ABNORMAL HIGH (ref 0.61–1.24)
GFR, Estimated: 51 mL/min — ABNORMAL LOW (ref >60)
Glucose, Bld: 97 mg/dL (ref 70–99)
Potassium: 4.7 mmol/L (ref 3.5–5.1)
Sodium: 140 mmol/L (ref 135–145)
Total Bilirubin: 0.4 mg/dL (ref 0.0–1.2)
Total Protein: 7.1 g/dL (ref 6.5–8.1)

## 2023-12-01 LAB — CBC WITH DIFFERENTIAL (CANCER CENTER ONLY)
Abs Immature Granulocytes: 0.02 K/uL (ref 0.00–0.07)
Basophils Absolute: 0.1 K/uL (ref 0.0–0.1)
Basophils Relative: 1 %
Eosinophils Absolute: 0.1 K/uL (ref 0.0–0.5)
Eosinophils Relative: 2 %
HCT: 41.5 % (ref 39.0–52.0)
Hemoglobin: 13.5 g/dL (ref 13.0–17.0)
Immature Granulocytes: 0 %
Lymphocytes Relative: 17 %
Lymphs Abs: 0.9 K/uL (ref 0.7–4.0)
MCH: 30.5 pg (ref 26.0–34.0)
MCHC: 32.5 g/dL (ref 30.0–36.0)
MCV: 93.7 fL (ref 80.0–100.0)
Monocytes Absolute: 0.6 K/uL (ref 0.1–1.0)
Monocytes Relative: 11 %
Neutro Abs: 3.6 K/uL (ref 1.7–7.7)
Neutrophils Relative %: 69 %
Platelet Count: 152 K/uL (ref 150–400)
RBC: 4.43 MIL/uL (ref 4.22–5.81)
RDW: 14.2 % (ref 11.5–15.5)
WBC Count: 5.2 K/uL (ref 4.0–10.5)
nRBC: 0 % (ref 0.0–0.2)

## 2023-12-03 ENCOUNTER — Other Ambulatory Visit: Payer: Self-pay | Admitting: Internal Medicine

## 2023-12-03 ENCOUNTER — Ambulatory Visit: Payer: Self-pay | Admitting: Internal Medicine

## 2023-12-06 DIAGNOSIS — D3131 Benign neoplasm of right choroid: Secondary | ICD-10-CM | POA: Diagnosis not present

## 2023-12-06 DIAGNOSIS — H2512 Age-related nuclear cataract, left eye: Secondary | ICD-10-CM | POA: Diagnosis not present

## 2023-12-06 DIAGNOSIS — H35361 Drusen (degenerative) of macula, right eye: Secondary | ICD-10-CM | POA: Diagnosis not present

## 2023-12-06 DIAGNOSIS — H2513 Age-related nuclear cataract, bilateral: Secondary | ICD-10-CM | POA: Diagnosis not present

## 2023-12-06 DIAGNOSIS — H353111 Nonexudative age-related macular degeneration, right eye, early dry stage: Secondary | ICD-10-CM | POA: Diagnosis not present

## 2023-12-10 ENCOUNTER — Other Ambulatory Visit: Payer: Self-pay

## 2023-12-16 ENCOUNTER — Other Ambulatory Visit: Payer: Self-pay

## 2023-12-18 ENCOUNTER — Encounter (INDEPENDENT_AMBULATORY_CARE_PROVIDER_SITE_OTHER): Payer: Self-pay

## 2023-12-20 ENCOUNTER — Other Ambulatory Visit: Payer: Self-pay

## 2023-12-20 NOTE — Progress Notes (Signed)
 Specialty Pharmacy Refill Coordination Note  Micheal Mcintyre is a 83 y.o. male contacted today regarding refills of specialty medication(s) Axitinib  (INLYTA )   Patient requested Delivery   Delivery date: 12/24/23   Verified address: [address] [name], [place]   Medication will be filled on 08.21.25.

## 2023-12-27 ENCOUNTER — Ambulatory Visit (HOSPITAL_BASED_OUTPATIENT_CLINIC_OR_DEPARTMENT_OTHER)

## 2023-12-27 ENCOUNTER — Ambulatory Visit (HOSPITAL_BASED_OUTPATIENT_CLINIC_OR_DEPARTMENT_OTHER)
Admission: RE | Admit: 2023-12-27 | Discharge: 2023-12-27 | Disposition: A | Discharge status: Home / Self Care | Source: Ambulatory Visit | Attending: Physician Assistant | Admitting: Physician Assistant

## 2023-12-27 DIAGNOSIS — R932 Abnormal findings on diagnostic imaging of liver and biliary tract: Secondary | ICD-10-CM | POA: Diagnosis not present

## 2023-12-27 DIAGNOSIS — C641 Malignant neoplasm of right kidney, except renal pelvis: Secondary | ICD-10-CM | POA: Insufficient documentation

## 2023-12-27 DIAGNOSIS — Z905 Acquired absence of kidney: Secondary | ICD-10-CM | POA: Diagnosis not present

## 2023-12-27 DIAGNOSIS — C787 Secondary malignant neoplasm of liver and intrahepatic bile duct: Secondary | ICD-10-CM | POA: Diagnosis not present

## 2023-12-27 NOTE — Progress Notes (Unsigned)
 [facility] OFFICE PROGRESS NOTE  Micheal Mcintyre LABOR, MD 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   PRIOR THERAPY: ) Status post right open radical nephrectomy completed on October 15, 2021 by Dr. Donnice Siad.  2) Keytruda  200 Mg IV every 3 weeks started December 03, 2021.  Status post 9 cycles.  Last dose was given May 21, 2022.  This was discontinued secondary to disease progression. 3) Systemic treatment with nivolumab  480 Mg IV every 4 weeks in addition to Cabometyx  40 mg p.o. daily.  First dose June 18, 2022.  Status post 7 cycles.  His Cabometyx  has been on hold recently due to adverse side effects. His dose was reduced in May to 20 mg p.o. daily but he continues to have weakness, diarrhea, and dehydration. This has been on hold since 5/19.  Last dose was given December 02, 2022 discontinued secondary to disease progression. 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  CURRENT THERAPY:  Axitinib  3 mg p.o. twice daily.  First dose started 11/03/2023.   INTERVAL HISTORY: Micheal Mcin[facility]re 83 y.o. male returns  to the clinic today for a follow up visit accompanied by his wife. The patient was last seen in the clinic on 12/01/23.   In summary, the patient was previously on treatment with lenvatinib  plus everolimus . This was on hold for a few months while he underwent treatment for pulmonary aspergillus. He is on treatment with voriconazole . He saw infectious disease on 11/25/23 and they recommended continuing for another few weeks on his treatment. He then had a repeat CT of the chest on 12/27/23.    This showed ***.   The patient resumed treatment fro his renal cell carcinoma on 11/03/23 with Inlyta . The patient denies any concerning adverse side effects.    He started a new medication regimen around July 2nd, which he is tolerating better than the previous one. No major new side effects have been noted since starting the new medication, and he has been able to engage in more activities, such as going out to eat with friends. He has gained a pound or so since the last visit.   No fevers, chills, night sweats, changes in appetite, nausea, vomiting, or unusual cough. No bloody sputum.   He reports regular bowel movements on the new medication and attributes this to improved eating habits. No significant shortness of breath, but he notes that he cannot engage in strenuous activities like chopping wood due to neuropathy in his feet, which limits his ability to do yard work.   ***cataract surgery.   He is here for evaluation and repeat blood work.   MEDICAL HISTORY: Past Medical History:  Diagnosis Date   Arthritis    GERD (gastroesophageal reflux disease)    Hypertension    Hypothyroidism    Medicare annual wellness visit, subsequent 08/19/2014   Osteoarthritis 09/21/2007   Qualifier: Diagnosis of  By: Viviann Raddle MD, Marsha R    PONV (postoperative nausea and vomiting)    Preventative health care 06/07/2016   Right shoulder pain 12/03/2016    ALLERGIES:  is allergic  to statins.  MEDICATIONS:  Current Outpatient Medications  Medication Sig Dispense Refill   acetaminophen  (TYLENOL ) 500 MG tablet Take 1,000 mg by mouth every 6 (six) hours as needed for moderate pain.     axitinib  (INLYTA ) 1 MG tablet Take 3 tablets (3 mg total) by mouth 2 (two) times daily. 180 tablet 3   chlorthalidone (HYGROTON) 25 MG tablet Take 25 mg by mouth every other day.     furosemide (LASIX) 20 MG tablet Take 20 mg by mouth daily as needed.     levothyroxine  (SYNTHROID ) 50 MCG tablet TAKE 1 TABLET(50  MCG) BY MOUTH DAILY BEFORE BREAKFAST 30 tablet 1   Multiple Vitamin (MULTIVITAMIN WITH MINERALS) TABS tablet Take 1 tablet by mouth 4 (four) times a week.     telmisartan  (MICARDIS ) 80 MG tablet TAKE 1/2 TABLET BY MOUTH TWICE DAILY 180 tablet 1   voriconazole  (VFEND ) 200 MG tablet Take 1 tablet (200 mg total) by mouth 2 (two) times daily. 60 tablet 3   No current facility-administered medications for this visit.    SURGICAL HISTORY:  Past Surgical History:  Procedure Laterality Date   BRONCHIAL BIOPSY  08/17/2023   Procedure: BRONCHOSCOPY, WITH BIOPSY;  Surgeon: Shelah Lamar RAMAN, MD;  Location: [facility];  Service: Pulmonary;;   BRONCHIAL BRUSHINGS  08/17/2023   Procedure: BRONCHOSCOPY, WITH BRUSH BIOPSY;  Surgeon: Shelah Lamar RAMAN, MD;  Location: [facility];  Service: Pulmonary;;   BRONCHIAL NEEDLE ASPIRATION BIOPSY  08/17/2023   Procedure: BRONCHOSCOPY, WITH NEEDLE ASPIRATION BIOPSY;  Surgeon: Shelah Lamar RAMAN, MD;  Location: [facility] END[facility];  Service: Pulmonary;;   COLONOSCOPY     HIP SURGERY Left    1991   JOINT REPLACEMENT Right 2005   hip   NEPHRECTOMY Right 10/15/2021   Procedure: NEPHRECTOMY, OPEN;  Surgeon: Selma Donnice SAUNDERS, MD;  Location: [facility];  Service: Urology;  Laterality: Right;   nose skin cancer removal      REVIEW OF SYSTEMS:   Review of Systems  Constitutional: Negative for appetite change, chills, fatigue, fever and unexpected weight change.  HENT:   Negative for mouth sores, nosebleeds, sore throat and trouble swallowing.   Eyes: Negative for eye problems and icterus.  Respiratory: Negative for cough, hemoptysis, shortness of breath and wheezing.   Cardiovascular: Negative for chest pain and leg swelling.  Gastrointestinal: Negative for abdominal pain, constipation, diarrhea, nausea and vomiting.  Genitourinary: Negative for bladder incontinence, difficulty urinating, dysuria, frequency and hematuria.   Musculoskeletal: Negative for back pain, gait problem, neck  pain and neck stiffness.  Skin: Negative for itching and rash.  Neurological: Negative for dizziness, extremity weakness, gait problem, headaches, light-headedness and seizures.  Hematological: Negative for adenopathy. Does not bruise/bleed easily.  Psychiatric/Behavioral: Negative for confusion, depression and sleep disturbance. The patient is not nervous/anxious.     PHYSICAL EXAMINATION:  There were no vitals taken for this visit.  ECOG PERFORMANCE STATUS: {CHL ONC ECOG [id number]  Physical Exam  Constitutional: Oriented to person, place, and time and well-developed, well-nourished, and in no distress. No distress.  HENT:  Head: Normocephalic and atraumatic.  Mouth/Throat: Oropharynx is clear and moist. No oropharyngeal exudate.  Eyes: Conjunctivae are normal. Right eye exhibits no discharge. Left eye exhibits no discharge. No scleral icterus.  Neck: Normal range of motion. Neck supple.  Cardiovascular: Normal rate, regular rhythm, normal heart sounds and intact distal pulses.   Pulmonary/Chest: Effort normal and breath sounds normal. No respiratory distress. No wheezes. No rales.  Abdominal: Soft. Bowel sounds  are normal. Exhibits no distension and no mass. There is no tenderness.  Musculoskeletal: Normal range of motion. Exhibits no edema.  Lymphadenopathy:    No cervical adenopathy.  Neurological: Alert and oriented to person, place, and time. Exhibits normal muscle tone. Gait normal. Coordination normal.  Skin: Skin is warm and dry. No rash noted. Not diaphoretic. No erythema. No pallor.  Psychiatric: Mood, memory and judgment normal.  Vitals reviewed.  LABORATORY DATA: Lab Results  Component Value Date   WBC 5.2 12/01/2023   HGB 13.5 12/01/2023   HCT 41.5 12/01/2023   MCV 93.7 12/01/2023   PLT 152 12/01/2023      Chemistry      Component Value Date/Time   NA 140 12/01/2023 1101   NA 140 04/22/2022 0000   K 4.7 12/01/2023 1101   CL 105 12/01/2023 1101   CO2  30 12/01/2023 1101   BUN 27 (H) 12/01/2023 1101   BUN 21 04/22/2022 0000   CREATININE 1.39 (H) 12/01/2023 1101   CREATININE 1.50 (H) 11/25/2023 1124   GLU 88 04/22/2022 0000      Component Value Date/Time   CALCIUM  9.3 12/01/2023 1101   ALKPHOS 165 (H) 12/01/2023 1101   AST 21 12/01/2023 1101   ALT 15 12/01/2023 1101   BILITOT 0.4 12/01/2023 1101       RADIOGRAPHIC STUDIES:  No results found.   ASSESSMENT/PLAN:  This is a very pleasant 83 year old Caucasian male diagnosed with metastatic papillary renal cell carcinoma.  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  This was diagnosed in June 2023.  He is status post a right open radical nephrectomy.  Patient was found to have metastatic disease to the liver and bone in February 2024.   The patient had previously underwent adjuvant treatment with Keytruda  20 mg IV every 3 weeks for 9 cycles.   When he saw Dr. Sherrod on 06/11/2022 and reviewed his MRI with him which showed multiple new rim-enhancing lesions throughout the liver consistent metastatic disease.  There was also a rim-enhancing lesion in the inferior endplate of L1 measuring 2.1 x 1.1 cm concerning for metastatic disease.  There is no local recurrence or lymphadenopathy. He also had a chest CT by Dr. Selma which showed pulmonary metastatic disease.    Dr. Sherrod recommended that the patient start treatment with nivolumab  480 mg IV every 4 weeks in addition to Cabometyx  40 mg grams p.o. daily.  He started this on 06/18/2022. He is status post 7 cycles of nivolumab .   He then had GI side effects of cabometyx  with diarrhea and weight loss. His treatment has periodically been on hold. His dose was reduced to 20 mg in May 2024, but he continued to have intolerance. Therefore, this has been on hold since 5/19. His diarrhea and weight loss have since resolved.   Unfortunately, the patient's restaging CT scan from  August showed disease progression.   The patient saw Dr. Joyice at [facility] for second opinion.  She recommended lenvatinib  plus everolimus .  She recommended starting at a small dose reduction of 14 mg in standard dose everolimus  standard dose 5 mg given his prior issues with cabo. If tolerates very well, could increase lenvatinib .  He had progression in the future, then she would recommend clinical trial if available versus axitinib . Therefore, he started this on 01/28/23. He has had some mouth sores, thrombocytopenia, mild nausea, hypertension, and sensitivity on the bottom of his feet.  His treatment has been on hold since 07/06/33.    Imaging at that time showed concern for atypical infection.    He is being treated with voriconazole  due to pulmonary aspergillus and following with infectious disease.    The patient started treatment for his malignancy with Inlyta  on 11/03/23 and thus far has tolerated well.     The patient was seen with Dr. Sherrod today.  Dr. Sherrod personally and independently reviewed the scan and discussed results with the patient today.  The scan showed ***.  Dr. Sherrod recommends ***  Labs were reviewed. Recommend he continue on the same treatment at the same dose.    We will see him back for labs and follow up visit in *** weeks.    He will continue to follow with infectious disease.   The patient was advised to call immediately if he has any concerning symptoms in the interval. The patient voices understanding of current disease status and treatment options and is in agreement with the current care plan. All questions were answered. The patient knows to call the clinic with any problems, questions or concerns. We can certainly see the patient much sooner if necessary        No orders of the defined types were placed in this encounter.    I spent {CHL ONC TIME VISIT - DTPQU:[id number]} counseling the patient face to face. The total time spent in the  appointment was {CHL ONC TIME VISIT - DTPQU:[id number]}.  Kerry Chisolm L Bintou Lafata, PA-C 12/27/23

## 2023-12-28 ENCOUNTER — Encounter: Payer: Self-pay | Admitting: Acute Care

## 2023-12-28 ENCOUNTER — Ambulatory Visit (INDEPENDENT_AMBULATORY_CARE_PROVIDER_SITE_OTHER): Admitting: Acute Care

## 2023-12-28 VITALS — BP 126/70 | HR 71 | Temp 98.0°F | Ht 70.0 in | Wt 121.2 lb

## 2023-12-28 DIAGNOSIS — B449 Aspergillosis, unspecified: Secondary | ICD-10-CM

## 2023-12-28 DIAGNOSIS — R918 Other nonspecific abnormal finding of lung field: Secondary | ICD-10-CM | POA: Diagnosis not present

## 2023-12-28 DIAGNOSIS — R911 Solitary pulmonary nodule: Secondary | ICD-10-CM

## 2023-12-28 DIAGNOSIS — R9389 Abnormal findings on diagnostic imaging of other specified body structures: Secondary | ICD-10-CM

## 2023-12-28 DIAGNOSIS — Z85528 Personal history of other malignant neoplasm of kidney: Secondary | ICD-10-CM

## 2023-12-28 NOTE — Patient Instructions (Addendum)
 It is good to see you today. Your scan shows relatively stable stable right upper lobe and left upper lobe pulmonary nodules.  There may have been a slight bit of growth since the scan in July.  I have messaged Dr. Sherrod and Dr. Overton to make decisions regarding when to stop the Voriconazole , and when to increase the axitinib  chemo dose. This is going to be a balancing act.  Follow up with Pulmonary in 3 months. [facility] with your cataract surgery 01/18/2024.  Call us  if you need us .  Please contact office for sooner follow up if symptoms do not improve or worsen or seek emergency care

## 2023-12-28 NOTE — Progress Notes (Unsigned)
 History of Present Illness Micheal Mcintyre is a 83 y.o. male never smoker referred to Dr. Shelah for evaluation of a lung mass in setting of immunocompromised patient with history or metastatic renal cancer currently undergoing treatment. He is followed by Dr. Shelah ( Pulmonary)  , Dr. Sherrod ( Medical oncology) and Dr. Overton( ID).   12/31/2023 Discussed the use of AI scribe software for clinical note transcription with the patient, who gave verbal consent to proceed.  Synopsis 83 year old gentleman with metastatic renal cell carcinoma followed by Dr. Sherrod. I saw him in late March after a CT scan of his chest 06/30/2023 revealed new right upper lobe, left upper lobe partially cavitary opacities concerning for either infectious process or malignancy. He was treated with with doxycycline  for 2 weeks. We repeated his CT scan of the chest to look for interval improvement after antibiotic treatment.    CT scan of the chest done 08/09/2023 shows enlarged more consolidated opacities in the right upper lobe, right lower lobe, still with areas of cavitation. There is an evolving less prominent left lower lobe infiltrate with partial cavitation as well that was not present in February. Also some scattered smaller subcentimeter pulmonary nodules right lower lobe predominant. Dr. Shelah spoke with the patient and his wife, as there were persistent mass like opacities, even after treatment wioth antibiotics, he has  recommended navigational bronchoscopy for biopsies and culture data. This was done 08/17/2023.  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 Dr. Shelah started him on patient on voriconazole  twice daily x 1 month. There were noted interactions with some of the patient's maintenance chemo meds, specifically lenvatinib  and everolimus  . These are on hold while he is taking the antifungal. He had been off his chemo agents since 07/07/2023.  There had been some interval progression  of disease therefore Dr. Gatha started the patient on axitinib  3 mg BID since early July 2025.  This medication does not interact with the voriconazole .  He has tolerated this medication well, actually feels much better on this medication than he did on the previous medication.  Patient is also followed by ID however is here for follow-up of his most recent CT chest to evaluate treatment efficacy of Micheal Mcintyre and is all and also to ensure no further progression of malignancy.    History of Present Illness Pt. Presents for follow up CT Chest after treatment with antifungal for pulmonary Aspergillus. He has no respiratory symptoms. No chest pain shortness of breath or cough reported. His chemo had been stopped during his Voriconazole  treatment as there was a drug interaction with the chemo, but was resumed on a different chemotherapy agent axitinib  3 mg BID since early July 2025. He has tolerated this medication much better than the previous medication. Per Dr. Sherrod note he will consider increasing axitinib  dose after completion of voriconazole  if needed.   Weight gain of 2-3 pounds over the last month or so. He has a good appetite, which is great news. .   Most recent CT Chest shows stable disease. Per ID's last note patient will most likely finish voriconazole  treatment if most recent CT Chest shows continued improvement. There is notation of a superimposed atypical infection such as atypical Mycobacterium that has increased in size over the last several imaging scans.  I will defer to infectious disease as to the plan for sputum cultures or treatment options for this finding.  Patient has follow-up with Dr. Vuin October.  I  will message him regarding this finding.  He will follow-up with us  in 3 months to ensure he is continuing to do well.     Test Results: 12/27/2023 CT Chest  Similar size of a relatively thick-walled cavitary lesion in the peripheral right upper lobe measuring 2.3 x 1.9 cm.  Similar thin walled cystic lesion in the peripheral posterior left upper lobe measuring 4.1 x 3.5 cm. 2. Numerous small discrete bilateral pulmonary nodules unchanged. 3. Numerous additional clustered and confluent centrilobular and tree-in-bud nodules primarily seen in the lower lobes are not significantly changed when compared to immediate prior examination but clearly increased over multiple prior examinations. 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 4. Multiple hypodense liver metastases are not well assessed by noncontrast CT but not obviously changed. 5. Hyperdense nodule of the tip of the pancreatic tail not well appreciated by noncontrast CT but not obviously changed. 6. Right nephrectomy.   Aortic Atherosclerosis (ICD10-I70.0).   Cytology 08/17/2023 FINAL MICROSCOPIC DIAGNOSIS:  A. LUNG, RUL OPACITY, FINE NEEDLE ASPIRATION:  - No malignant cells identified  - Fungus consistent with Aspergillus   B. LUNG, RUL OPACITY, BRUSHING:  - No malignant cells identified  - Fungus consistent with Aspergillus      D. LUNG, LUL SUPERIOR SEGMENT, FINE NEEDLE ASPIRATION  BIOPSIES:  - No malignant cells present  - Few benign/reactive mesothelial cells  - Mixed inflammatory cells    Super D CT 08/09/2023 Pulmonary metastatic disease, minimally progressive. 2. New and evolving areas of consolidation, ground-glass and cavitation, indicative of a fungal pneumonia. 3. Stable hepatic metastatic disease. 4. Stable mildly hyperdense lesion along the pancreatic tail, possibly a metastasis. 5.  Aortic atherosclerosis (ICD10-I70.0).        CT chest 06/30/2023>> New right upper lobe , left upper lobe partially cavitary lesions concerning for infectious process or malignancy. Treated with Doxycycline  x 2 weeks,  Repeat CT Chest      Latest Ref Rng & Units 12/29/2023   11:08 AM 12/01/2023   11:01 AM 11/25/2023   11:24 AM  CBC  WBC 4.0 - 10.5  K/uL 5.9  5.2  6.4   Hemoglobin 13.0 - 17.0 g/dL 86.0  86.4  86.2   Hematocrit 39.0 - 52.0 % 43.0  41.5  43.3   Platelets 150 - 400 K/uL 146  152  167        Latest Ref Rng & Units 12/29/2023   11:08 AM 12/01/2023   11:01 AM 11/25/2023   11:24 AM  BMP  Glucose 70 - 99 mg/dL 896  97  80   BUN 8 - 23 mg/dL 25  27  28    Creatinine 0.61 - 1.24 mg/dL 8.72  8.60  8.49   BUN/Creat Ratio 6 - 22 (calc)   19   Sodium 135 - 145 mmol/L 141  140  141   Potassium 3.5 - 5.1 mmol/L 4.9  4.7  5.1   Chloride 98 - 111 mmol/L 105  105  104   CO2 22 - 32 mmol/L 31  30  31    Calcium  8.9 - 10.3 mg/dL 9.6  9.3  9.8     BNP No results found for: BNP  ProBNP No results found for: PROBNP  PFT No results found for: FEV1PRE, FEV1POST, FVCPRE, FVCPOST, TLC, DLCOUNC, PREFEV1FVCRT, PSTFEV1FVCRT  CT CHEST ABDOMEN PELVIS WO CONTRAST Result Date: 12/27/2023 CLINICAL DATA:  Hematologic malignancy, assess treatment response liver lesions, metastatic disease evaluation * Tracking Code: BO * EXAM: CT  CHEST, ABDOMEN AND PELVIS WITHOUT CONTRAST TECHNIQUE: Multidetector CT imaging of the chest, abdomen and pelvis was performed following the standard protocol without IV contrast. RADIATION DOSE REDUCTION: This exam was performed according to the departmental dose-optimization program which includes automated exposure control, adjustment of the mA and/or kV according to patient size and/or use of iterative reconstruction technique. COMPARISON:  11/19/2023 FINDINGS: CT CHEST FINDINGS Cardiovascular: Scattered aortic atherosclerosis. Normal heart size. No pericardial effusion. Mediastinum/Nodes: No enlarged mediastinal, hilar, or axillary lymph nodes. Thyroid  gland, trachea, and esophagus demonstrate no significant findings. Lungs/Pleura: Similar size of a relatively thick-walled cavitary lesion in the peripheral right upper lobe measuring 2.3 x 1.9 cm (series 302, image 31). Similar thin walled cystic lesion in  the peripheral posterior left upper lobe measuring 4.1 x 3.5 cm (series 302, image 48). Numerous small discrete bilateral pulmonary nodules unchanged, index nodule of the anterior right upper lobe measuring 0.6 cm (series 302, image 44), index nodule of the dependent right lower lobe measuring 1.1 cm (series 302, image 97). Numerous additional clustered and confluent centrilobular and tree-in-bud nodules primarily seen in the lower lobes are not significantly changed when compared to immediate prior examination but clearly increased over multiple prior examinations (series 302, image 87, 105). No pleural effusion or pneumothorax. Musculoskeletal: No chest wall abnormality. No acute osseous findings. CT ABDOMEN PELVIS FINDINGS Hepatobiliary: Multiple hypodense liver lesions are not well assessed by noncontrast CT but not obviously changed, index lesion in the anterior liver dome measuring 1.8 x 1.7 cm (series 301, image 57) and in the inferior right lobe of the liver measuring 2.1 x 2.0 cm (series 301, image 68). No gallstones, gallbladder wall thickening, or biliary dilatation. Pancreas: Hyperdense nodule of the tip of the pancreatic tail not well appreciated by noncontrast CT but not obviously changed (series 301, image 64). No pancreatic ductal dilatation or surrounding inflammatory changes. Spleen: Normal in size without significant abnormality. Adrenals/Urinary Tract: Adrenal glands are unremarkable. Right nephrectomy. Left kidney without renal calculi, obvious solid lesion, or hydronephrosis on noncontrast examination. Bladder is unremarkable. Stomach/Bowel: Stomach is within normal limits. Appendix appears normal. No evidence of bowel wall thickening, distention, or inflammatory changes. Vascular/Lymphatic: No significant vascular findings are present. No enlarged abdominal or pelvic lymph nodes. Reproductive: No mass or other abnormality. Other: Small left inguinal hernia containing nonobstructed sigmoid  colon (series 301, image 109). No ascites. Musculoskeletal: No acute osseous findings. Right hip total arthroplasty. IMPRESSION: 1. Similar size of a relatively thick-walled cavitary lesion in the peripheral right upper lobe measuring 2.3 x 1.9 cm. Similar thin walled cystic lesion in the peripheral posterior left upper lobe measuring 4.1 x 3.5 cm. 2. Numerous small discrete bilateral pulmonary nodules unchanged. 3. Numerous additional clustered and confluent centrilobular and tree-in-bud nodules primarily seen in the lower lobes are not significantly changed when compared to immediate prior examination but clearly increased over multiple prior examinations. 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 4. Multiple hypodense liver metastases are not well assessed by noncontrast CT but not obviously changed. 5. Hyperdense nodule of the tip of the pancreatic tail not well appreciated by noncontrast CT but not obviously changed. 6. Right nephrectomy. Aortic Atherosclerosis (ICD10-I70.0). Electronically Signed   By: Marolyn JONETTA Jaksch M.D.   On: 12/27/2023 16:39     Past medical hx Past Medical History:  Diagnosis Date   Arthritis    GERD (gastroesophageal reflux disease)    Hypertension    Hypothyroidism    Medicare annual wellness visit,  subsequent 08/19/2014   Osteoarthritis 09/21/2007   Qualifier: Diagnosis of  By: Viviann Raddle MD, Marsha SAUNDERS    PONV (postoperative nausea and vomiting)    Preventative health care 06/07/2016   Right shoulder pain 12/03/2016     Social History   Tobacco Use   Smoking status: Never    Passive exposure: Never   Smokeless tobacco: Never  Vaping Use   Vaping status: Never Used  Substance Use Topics   Alcohol use: Not Currently    Comment: occ beer   Drug use: No    Mr.Basden reports that he has never smoked. He has never been exposed to tobacco smoke. He has never used smokeless tobacco. He reports that he does not  currently use alcohol. He reports that he does not use drugs.  Tobacco Cessation: Counseling given: Not Answered Never smoker  Past surgical hx, Family hx, Social hx all reviewed.  Current Outpatient Medications on File Prior to Visit  Medication Sig   acetaminophen  (TYLENOL ) 500 MG tablet Take 1,000 mg by mouth every 6 (six) hours as needed for moderate pain.   axitinib  (INLYTA ) 1 MG tablet Take 3 tablets (3 mg total) by mouth 2 (two) times daily.   chlorthalidone (HYGROTON) 25 MG tablet Take 25 mg by mouth every other day.   furosemide (LASIX) 20 MG tablet Take 20 mg by mouth daily as needed.   Multiple Vitamin (MULTIVITAMIN WITH MINERALS) TABS tablet Take 1 tablet by mouth 4 (four) times a week.   telmisartan  (MICARDIS ) 80 MG tablet TAKE 1/2 TABLET BY MOUTH TWICE DAILY   voriconazole  (VFEND ) 200 MG tablet Take 1 tablet (200 mg total) by mouth 2 (two) times daily.   No current facility-administered medications on file prior to visit.     Allergies  Allergen Reactions   Statins Other (See Comments)    myalgia    Review Of Systems:  Constitutional:   No  weight loss, night sweats,  Fevers, chills, fatigue, or  lassitude.  HEENT:   No headaches,  Difficulty swallowing,  Tooth/dental problems, or  Sore throat,                No sneezing, itching, ear ache, nasal congestion, post nasal drip,   CV:  No chest pain,  Orthopnea, PND, swelling in lower extremities, anasarca, dizziness, palpitations, syncope.   GI  No heartburn, indigestion, abdominal pain, nausea, vomiting, diarrhea, change in bowel habits, loss of appetite, bloody stools.   Resp: No shortness of breath with exertion or at rest.  No excess mucus, no productive cough,  No non-productive cough,  No coughing up of blood.  No change in color of mucus.  No wheezing.  No chest wall deformity  Skin: no rash or lesions.  GU: no dysuria, change in color of urine, no urgency or frequency.  No flank pain, no hematuria   MS:   + joint pain or swelling.  + decreased range of motion.  No back pain.  Psych:  No change in mood or affect. No depression or anxiety.  No memory loss.   Vital Signs BP 126/70   Pulse 71   Temp 98 F (36.7 C) (Temporal)   Ht 5' 10 (1.778 m)   Wt 121 lb 3.2 oz (55 kg)   SpO2 98%   BMI 17.39 kg/m    Physical Exam:  General- No distress,  A&Ox3, very pleasant thin male walking with a cane ENT: No sinus tenderness, TM clear, pale nasal mucosa, no  oral exudate,no post nasal drip, no LAN Cardiac: S1, S2, regular rate and rhythm, no murmur Chest: No wheeze/ rales/ dullness; no accessory muscle use, no nasal flaring, no sternal retractions, slightly diminished per bases Abd.: Soft Non-tender, nondistended, bowel sounds positive,Body mass index is 17.39 kg/m.  Ext: No clubbing cyanosis, edema, no obvious deformities Neuro: Physical deconditioning, walking with a cane, moving all extremities x 4, alert and oriented x 3 Skin: No rashes, warm and dry Psych: normal mood and behavior    Assessment & Plan Hx. Of Renal Cell Carcinoma  Treatment with voriconazole  for positive Aspergillus Cx ( Bronch 08/17/2023), with improvement on imaging   Also followed by ID who is prescribing Vericonazole. Chemo on hold 2/2 serious interaction between voriconazole  and lenvatinib  and everolimus   Progression of disease off chemotherapy agents New chemo agent started 11/2023 with good tolerence + weight gain since last visit ? MAI on most recent CT chest Plan Your scan shows relatively stable stable right upper lobe and left upper lobe pulmonary nodules.  There may have been a slight bit of growth since the scan in July.  I have messaged Dr. Sherrod and Dr. Overton to make decisions regarding when to stop the Voriconazole , and when to increase the axitinib  chemo dose. This is going to be a balancing act.  Follow up with Pulmonary in 3 months. Metta Sours with your cataract surgery 01/18/2024.  Call us  if you  need us .  Please contact office for sooner follow up if symptoms do not improve or worsen or seek emergency care     I spent 30 minutes dedicated to the care of this patient on the date of this encounter to include pre-visit review of records, face-to-face time with the patient discussing conditions above, post visit ordering of testing, clinical documentation with the electronic health record, making appropriate referrals as documented, and communicating necessary information to the patient's healthcare team.      Lauraine JULIANNA Lites, NP 12/31/2023  5:31 PM

## 2023-12-29 ENCOUNTER — Inpatient Hospital Stay (HOSPITAL_BASED_OUTPATIENT_CLINIC_OR_DEPARTMENT_OTHER): Admitting: Physician Assistant

## 2023-12-29 ENCOUNTER — Other Ambulatory Visit: Payer: Self-pay | Admitting: *Deleted

## 2023-12-29 ENCOUNTER — Inpatient Hospital Stay: Attending: Oncology

## 2023-12-29 VITALS — BP 147/79 | HR 69 | Temp 98.0°F | Resp 14 | Wt 121.8 lb

## 2023-12-29 DIAGNOSIS — C787 Secondary malignant neoplasm of liver and intrahepatic bile duct: Secondary | ICD-10-CM | POA: Insufficient documentation

## 2023-12-29 DIAGNOSIS — C7800 Secondary malignant neoplasm of unspecified lung: Secondary | ICD-10-CM | POA: Diagnosis not present

## 2023-12-29 DIAGNOSIS — C641 Malignant neoplasm of right kidney, except renal pelvis: Secondary | ICD-10-CM | POA: Diagnosis not present

## 2023-12-29 DIAGNOSIS — Z905 Acquired absence of kidney: Secondary | ICD-10-CM | POA: Diagnosis not present

## 2023-12-29 DIAGNOSIS — Z5112 Encounter for antineoplastic immunotherapy: Secondary | ICD-10-CM | POA: Diagnosis not present

## 2023-12-29 DIAGNOSIS — R918 Other nonspecific abnormal finding of lung field: Secondary | ICD-10-CM | POA: Diagnosis not present

## 2023-12-29 DIAGNOSIS — C7951 Secondary malignant neoplasm of bone: Secondary | ICD-10-CM | POA: Diagnosis not present

## 2023-12-29 DIAGNOSIS — Z9221 Personal history of antineoplastic chemotherapy: Secondary | ICD-10-CM | POA: Insufficient documentation

## 2023-12-29 LAB — CBC WITH DIFFERENTIAL (CANCER CENTER ONLY)
Abs Immature Granulocytes: 0.01 K/uL (ref 0.00–0.07)
Basophils Absolute: 0.1 K/uL (ref 0.0–0.1)
Basophils Relative: 1 %
Eosinophils Absolute: 0.1 K/uL (ref 0.0–0.5)
Eosinophils Relative: 1 %
HCT: 43 % (ref 39.0–52.0)
Hemoglobin: 13.9 g/dL (ref 13.0–17.0)
Immature Granulocytes: 0 %
Lymphocytes Relative: 15 %
Lymphs Abs: 0.9 K/uL (ref 0.7–4.0)
MCH: 30.3 pg (ref 26.0–34.0)
MCHC: 32.3 g/dL (ref 30.0–36.0)
MCV: 93.7 fL (ref 80.0–100.0)
Monocytes Absolute: 0.6 K/uL (ref 0.1–1.0)
Monocytes Relative: 10 %
Neutro Abs: 4.3 K/uL (ref 1.7–7.7)
Neutrophils Relative %: 73 %
Platelet Count: 146 K/uL — ABNORMAL LOW (ref 150–400)
RBC: 4.59 MIL/uL (ref 4.22–5.81)
RDW: 15.1 % (ref 11.5–15.5)
WBC Count: 5.9 K/uL (ref 4.0–10.5)
nRBC: 0 % (ref 0.0–0.2)

## 2023-12-29 LAB — CMP (CANCER CENTER ONLY)
ALT: 14 U/L (ref 0–44)
AST: 21 U/L (ref 15–41)
Albumin: 4.1 g/dL (ref 3.5–5.0)
Alkaline Phosphatase: 174 U/L — ABNORMAL HIGH (ref 38–126)
Anion gap: 5 (ref 5–15)
BUN: 25 mg/dL — ABNORMAL HIGH (ref 8–23)
CO2: 31 mmol/L (ref 22–32)
Calcium: 9.6 mg/dL (ref 8.9–10.3)
Chloride: 105 mmol/L (ref 98–111)
Creatinine: 1.27 mg/dL — ABNORMAL HIGH (ref 0.61–1.24)
GFR, Estimated: 57 mL/min — ABNORMAL LOW (ref >60)
Glucose, Bld: 103 mg/dL — ABNORMAL HIGH (ref 70–99)
Potassium: 4.9 mmol/L (ref 3.5–5.1)
Sodium: 141 mmol/L (ref 135–145)
Total Bilirubin: 0.4 mg/dL (ref 0.0–1.2)
Total Protein: 7 g/dL (ref 6.5–8.1)

## 2023-12-29 MED ORDER — LEVOTHYROXINE SODIUM 50 MCG PO TABS: 50.0000 ug | ORAL_TABLET | Freq: Every day | ORAL | 1 refills | Status: DC

## 2023-12-31 ENCOUNTER — Encounter: Payer: Self-pay | Admitting: Acute Care

## 2024-01-11 ENCOUNTER — Other Ambulatory Visit: Payer: Self-pay | Admitting: Family Medicine

## 2024-01-13 ENCOUNTER — Other Ambulatory Visit: Payer: Self-pay

## 2024-01-13 ENCOUNTER — Ambulatory Visit (INDEPENDENT_AMBULATORY_CARE_PROVIDER_SITE_OTHER): Admitting: *Deleted

## 2024-01-13 VITALS — BP 113/68 | HR 69 | Temp 97.6°F | Resp 16 | Ht 70.0 in | Wt 123.4 lb

## 2024-01-13 DIAGNOSIS — Z0000 Encounter for general adult medical examination without abnormal findings: Secondary | ICD-10-CM | POA: Diagnosis not present

## 2024-01-13 DIAGNOSIS — Z23 Encounter for immunization: Secondary | ICD-10-CM | POA: Diagnosis not present

## 2024-01-13 NOTE — Patient Instructions (Addendum)
 [name] , Thank you for taking time out of your busy schedule to complete your Annual Wellness Visit with me. I enjoyed our conversation and look forward to speaking with you again next year. I, as well as your care team,  appreciate your ongoing commitment to your health goals. Please review the following plan we discussed and let me know if I can assist you in the future. Your Game plan/ To Do List     Follow up Visits: Next Medicare AWV with our clinical staff:  01/18/25 9:40am, telephone.  Next Office Visit with your provider: 03/15/24 10:20am, [name].  Clinician Recommendations:  Aim for 30 minutes of exercise or brisk walking, 6-8 glasses of water, and 5 servings of fruits and vegetables each day.       This is a list of the screening recommended for you and due dates:  Health Maintenance  Topic Date Due   Medicare Annual Wellness Visit  03/05/2023   Flu Shot  12/03/2023   DTaP/Tdap/Td vaccine (4 - Tdap) 01/30/2028   Pneumococcal Vaccine for age over 90  Completed   Zoster (Shingles) Vaccine  Completed   HPV Vaccine  Aged Out   Meningitis B Vaccine  Aged Out   Colon Cancer Screening  Discontinued   COVID-19 Vaccine  Discontinued    Advanced directives: (In Chart) A copy of your advanced directives are scanned into your chart should your provider ever need it. Advance Care Planning is important because it:  [x]  Makes sure you receive the medical care that is consistent with your values, goals, and preferences  [x]  It provides guidance to your family and loved ones and reduces their decisional burden about whether or not they are making the right decisions based on your wishes.  Follow the link provided in your after visit summary or read over the paperwork we have mailed to you to help you started getting your Advance Directives in place. If you need assistance in completing these, please reach out to us  so that we can help you!  See attachments for Preventive Care  and Fall Prevention Tips.

## 2024-01-13 NOTE — Progress Notes (Unsigned)
 Subjective:   Micheal Mcintyre is a 83 y.o. who presents for a Medicare Wellness preventive visit.  As a reminder, Annual Wellness Visits Hardeep't include a physical exam, and some assessments may be limited, especially if this visit is performed virtually. We may recommend an in-person follow-up visit with your provider if needed.  Visit Complete: In person  Persons Participating in Visit: Patient.  AWV Questionnaire: No: Patient Medicare AWV questionnaire was not completed prior to this visit.  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     01/13/2024   10:04 AM 08/17/2023   10:38 AM 07/07/2023    3:50 PM 10/13/2022   12:34 PM 06/18/2022    3:42 PM 04/30/2022   10:40 AM 03/04/2022    9:09 AM  Advanced Directives  Does Patient Have a Medical Advance Directive? Yes Yes No No No No Yes  Type of Estate agent of [place];Living will Healthcare Power of [place];Living will     Healthcare Power of [place];Living will  Does patient want to make changes to medical advance directive? No - Patient declined      No - Patient declined  Copy of Healthcare Power of Attorney in Chart? Yes - validated most recent copy scanned in chart (See row information) No - copy requested     Yes - validated most recent copy scanned in chart (See row information)  Would patient like information on creating a medical advance directive?    No - Patient declined No - Patient declined No - Patient declined     Current Medications (verified) Outpatient Encounter Medications as of 01/13/2024  Medication Sig   acetaminophen  (TYLENOL ) 500 MG  tablet Take 1,000 mg by mouth every 6 (six) hours as needed for moderate pain.   amLODipine  (NORVASC ) 5 MG tablet Take 5 mg by mouth daily. Take only when systolic is greater than 140   axitinib  (INLYTA ) 1 MG tablet Take 3 tablets (3 mg total) by mouth 2 (two) times daily.   chlorthalidone (HYGROTON) 25 MG tablet Take 25 mg by mouth every other day.   furosemide (LASIX) 20 MG tablet Take 20 mg by mouth daily as needed.   levothyroxine  (SYNTHROID ) 50 MCG tablet Take 1 tablet (50 mcg total) by mouth daily before breakfast.   Multiple Vitamin (MULTIVITAMIN WITH MINERALS) TABS tablet Take 1 tablet by mouth 4 (four) times a week.   telmisartan  (MICARDIS ) 80 MG tablet TAKE 1/2 TABLET BY MOUTH TWICE DAILY   voriconazole  (VFEND ) 200 MG tablet Take 1 tablet (200 mg total) by mouth 2 (two) times daily.   No facility-administered encounter medications on file as of 01/13/2024.    Allergies (verified) Statins   History: Past Medical History:  Diagnosis Date   Arthritis    Bilateral cataracts    GERD (gastroesophageal reflux disease)    Hypertension    Hypothyroidism    Medicare annual wellness visit, subsequent 08/19/2014   Osteoarthritis 09/21/2007   Qualifier: Diagnosis of  By: Viviann Raddle MD, Marsha SAUNDERS    PONV (postoperative nausea and vomiting)    Preventative health care  06/07/2016   Right shoulder pain 12/03/2016   Past Surgical History:  Procedure Laterality Date   BRONCHIAL BIOPSY  08/17/2023   Procedure: BRONCHOSCOPY, WITH BIOPSY;  Surgeon: Shelah Lamar RAMAN, MD;  Location: [facility] ENDOSCOPY;  Service: Pulmonary;;   BRONCHIAL BRUSHINGS  08/17/2023   Procedure: BRONCHOSCOPY, WITH BRUSH BIOPSY;  Surgeon: Shelah Lamar RAMAN, MD;  Location: [facility] ENDOSCOPY;  Service: Pulmonary;;   BRONCHIAL NEEDLE ASPIRATION BIOPSY  08/17/2023   Procedure: BRONCHOSCOPY, WITH NEEDLE ASPIRATION BIOPSY;  Surgeon: Shelah Lamar RAMAN, MD;  Location: [facility] ENDOSCOPY;  Service: Pulmonary;;   COLONOSCOPY     HIP SURGERY Left     1991   JOINT REPLACEMENT Right 2005   hip   NEPHRECTOMY Right 10/15/2021   Procedure: NEPHRECTOMY, OPEN;  Surgeon: Selma Donnice SAUNDERS, MD;  Location: [facility] ORS;  Service: Urology;  Laterality: Right;   nose skin cancer removal     Family History  Problem Relation Age of Onset   Heart attack Father 16       deceased   Diabetes Maternal Uncle        maternal grandfather   Hypertension Neg Hx    Breast cancer Neg Hx    Colon cancer Neg Hx    Prostate cancer Neg Hx    Social History   Socioeconomic History   Marital status: Married    Spouse name: Not on file   Number of children: Not on file   Years of education: Not on file   Highest education level: Not on file  Occupational History   Not on file  Tobacco Use   Smoking status: Never    Passive exposure: Never   Smokeless tobacco: Never  Vaping Use   Vaping status: Never Used  Substance and Sexual Activity   Alcohol use: Not Currently    Comment: occ beer   Drug use: No   Sexual activity: Not Currently  Other Topics Concern   Not on file  Social History Narrative   Not on file   Social Drivers of Health   Financial Resource Strain: Low Risk  (01/13/2024)   Overall Financial Resource Strain (CARDIA)    Difficulty of Paying Living Expenses: Not very hard  Food Insecurity: No Food Insecurity (01/13/2024)   Hunger Vital Sign    Worried About Running Out of Food in the Last Year: Never true    Ran Out of Food in the Last Year: Never true  Transportation Needs: No Transportation Needs (01/13/2024)   PRAPARE - Administrator, Civil Service (Medical): No    Lack of Transportation (Non-Medical): No  Physical Activity: Sufficiently Active (01/13/2024)   Exercise Vital Sign    Days of Exercise per Week: 5 days    Minutes of Exercise per Session: 30 min  Stress: No Stress Concern Present (01/13/2024)   [employer] of Occupational Health - Occupational Stress Questionnaire    Feeling of Stress: Not at all  Social  Connections: Moderately Integrated (01/13/2024)   Social Connection and Isolation Panel    Frequency of Communication with Friends and Family: More than three times a week    Frequency of Social Gatherings with Friends and Family: More than three times a week    Attends Religious Services: More than 4 times per year    Active Member of [employer] or Organizations: No    Attends Banker Meetings: Never    Marital Status: Married    Tobacco Counseling Counseling given: Not Answered  Clinical Intake:  Pre-visit preparation completed: Yes  Pain : No/denies pain     BMI - recorded: 17.71 Nutritional Status: BMI <19  Underweight (currently on cancer medication and has gained a few pounds) Diabetes: No  Lab Results  Component Value Date   HGBA1C 4.8 12/03/2022   HGBA1C 6.4 03/31/2022   HGBA1C 6.1 09/22/2021     How often do you need to have someone help you when you read instructions, pamphlets, or other written materials from your doctor or pharmacy?: 1 - Never  Interpreter Needed?: No  Information entered by :: Lolita Libra, CMA   Activities of Daily Living     01/13/2024    9:52 AM  In your present state of health, do you have any difficulty performing the following activities:  Hearing? 1  Vision? 0  Difficulty concentrating or making decisions? 0  Walking or climbing stairs? 1  Dressing or bathing? 0  Doing errands, shopping? 0  Preparing Food and eating ? N  Using the Toilet? N  In the past six months, have you accidently leaked urine? Y  Comment Has frequent urination at night, occasional dribble. Has kidney cancer  Do you have problems with loss of bowel control? N  Managing your Medications? N  Managing your Finances? N  Housekeeping or managing your Housekeeping? N    Patient Care Team: [name], MD as PCP - General (Family Medicine) Marcey Elspeth PARAS, MD as Consulting Physician (Ophthalmology)  I have updated your Care Teams any  recent Medical Services you may have received from other providers in the past year.     Assessment:   This is a routine wellness examination for [facility].  Hearing/Vision screen Hearing Screening - Comments:: Ears feel stopped up x 2 months. Has difficulty hearing in groups x 3-4 months. Has taken claritin and used syringe suction and has noted some improvement. Vision Screening - Comments:: Up to date with routine eye exams with [facility], Dr Marcey.    Goals Addressed   None    Depression Screen     01/13/2024    9:59 AM 12/03/2022    3:05 PM 03/31/2022    9:21 AM 03/04/2022    9:12 AM 01/08/2021    8:30 AM 08/06/2020    9:49 AM 12/26/2019    8:58 AM  PHQ 2/9 Scores  PHQ - 2 Score 0 0 0 0 0 0 0  PHQ- 9 Score 1          Fall Risk     01/13/2024    9:54 AM 11/25/2023   10:53 AM 10/15/2023   10:51 AM 09/24/2023   11:00 AM 08/24/2023    3:00 PM  Fall Risk   Falls in the past year? 0 0  0 0  Number falls in past yr: 0 0 0 0   Injury with Fall? 0 0 0 0   Risk for fall due to : Impaired balance/gait Impaired balance/gait Impaired balance/gait;Impaired mobility Impaired mobility;Impaired balance/gait   Follow up Education provided Falls evaluation completed       MEDICARE RISK AT HOME:  Medicare Risk at Home Any stairs in or around the home?: Yes If so, are there any without handrails?: No Home free of loose throw rugs in walkways, pet beds, electrical cords, etc?: Yes Adequate lighting in your home to reduce risk of falls?: Yes Life alert?: No Use of a cane, walker or w/c?: Yes (cane) Grab bars in the bathroom?: Yes Shower chair or  bench in shower?: No Elevated toilet seat or a handicapped toilet?: Yes  TIMED UP AND GO:  Was the test performed?  Yes  Length of time to ambulate 10 feet: 10 sec Gait unsteady with use of assistive device, provider informed and education provided.   Cognitive Function: 6CIT completed    06/01/2016    2:10 PM  MMSE - Mini Mental State Exam   Orientation to time 5   Orientation to Place 5   Registration 3   Attention/ Calculation 5   Recall 3   Language- name 2 objects 2   Language- repeat 1  Language- follow 3 step command 3   Language- read & follow direction 1   Write a sentence 1   Copy design 1   Total score 30      Data saved with a previous flowsheet row definition        01/13/2024   10:04 AM 03/04/2022    9:24 AM  6CIT Screen  What Year? 0 points 0 points  What month? 0 points 0 points  What time? 0 points 0 points  Count back from 20 0 points 0 points  Months in reverse 0 points 0 points  Repeat phrase 2 points 0 points  Total Score 2 points 0 points    Immunizations Immunization History  Administered Date(s) Administered   Fluad Quad(high Dose 65+) 02/16/2019, 02/06/2020, 03/20/2021, 03/10/2022   INFLUENZA, HIGH DOSE SEASONAL PF 01/07/2018, 01/13/2024   Influenza Whole 02/07/2008, 02/01/2009   Influenza,inj,Quad PF,6+ Mos 01/24/2013, 01/01/2014   Influenza-Unspecified 02/23/2015, 03/04/2016   PFIZER(Purple Top)SARS-COV-2 Vaccination 05/27/2019, 06/19/2019, 03/04/2020   Pneumococcal Conjugate-13 05/24/2013   Pneumococcal Polysaccharide-23 02/07/2008, 06/01/2016   Respiratory Syncytial Virus Vaccine ,Recomb Aduvanted(Arexvy ) 03/31/2022   Td 09/28/2007, 01/29/2018   Td (Adult),5 Lf Tetanus Toxid, Preservative Free 01/29/2018   Zoster Recombinant(Shingrix) 03/20/2018, 06/21/2018   Zoster, Live 10/19/2007, 05/02/2009    Screening Tests Health Maintenance  Topic Date Due   Medicare Annual Wellness (AWV)  03/05/2023   DTaP/Tdap/Td (4 - Tdap) 01/30/2028   Pneumococcal Vaccine: 50+ Years  Completed   Influenza Vaccine  Completed   Zoster Vaccines- Shingrix  Completed   HPV VACCINES  Aged Out   Meningococcal B Vaccine  Aged Out   Colonoscopy  Discontinued   COVID-19 Vaccine  Discontinued    Health Maintenance Items Addressed: Flu vaccine given today.  Additional Screening:  Vision  Screening: Recommended annual ophthalmology exams for early detection of glaucoma and other disorders of the eye. Is the patient up to date with their annual eye exam?  Yes  Who is the provider or what is the name of the office in which the patient attends annual eye exams? [facility]  Dental Screening: Recommended annual dental exams for proper oral hygiene  Community Resource Referral / Chronic Care Management: CRR required this visit?  No   CCM required this visit?  No   Plan:    I have personally reviewed and noted the following in the patient's chart:   Medical and social history Use of alcohol, tobacco or illicit drugs  Current medications and supplements including opioid prescriptions. Patient is not currently taking opioid prescriptions. Functional ability and status Nutritional status Physical activity Advanced directives List of other physicians Hospitalizations, surgeries, and ER visits in previous 12 months Vitals Screenings to include cognitive, depression, and falls Referrals and appointments  In addition, I have reviewed and discussed with patient certain preventive protocols, quality metrics, and best practice recommendations. A written  personalized care plan for preventive services as well as general preventive health recommendations were provided to patient.   Lolita Libra, CMA   01/13/2024   After Visit Summary: (In Person-Printed) AVS printed and given to the patient  Notes: {Nurse Notes:[id number]}

## 2024-01-14 ENCOUNTER — Telehealth: Payer: Self-pay | Admitting: *Deleted

## 2024-01-14 NOTE — Telephone Encounter (Signed)
 Pt had AWV on 9/11. Wanted PCP to know that his cancer medication was changed recently and diarrhea is improving and has happily gained a few pounds since this change.  Also request handicap parking permit.  Application completed and placed in PCP yellow folder for signature.  Please call pt when ready to pick up.

## 2024-01-18 DIAGNOSIS — H25812 Combined forms of age-related cataract, left eye: Secondary | ICD-10-CM | POA: Diagnosis not present

## 2024-01-18 DIAGNOSIS — H268 Other specified cataract: Secondary | ICD-10-CM | POA: Diagnosis not present

## 2024-01-19 ENCOUNTER — Ambulatory Visit: Admitting: Internal Medicine

## 2024-01-24 DIAGNOSIS — H25811 Combined forms of age-related cataract, right eye: Secondary | ICD-10-CM | POA: Diagnosis not present

## 2024-01-25 ENCOUNTER — Other Ambulatory Visit: Payer: Self-pay

## 2024-01-25 ENCOUNTER — Other Ambulatory Visit (HOSPITAL_COMMUNITY): Payer: Self-pay

## 2024-01-25 NOTE — Progress Notes (Signed)
 Specialty Pharmacy Refill Coordination Note  Micheal Mcintyre is a 83 y.o. male contacted today regarding refills of specialty medication(s) Axitinib  (INLYTA )   Patient requested Delivery   Delivery date: 01/28/24   Verified address: [address] [name], [place]   Medication will be filled on 01/27/24.

## 2024-01-26 ENCOUNTER — Inpatient Hospital Stay (HOSPITAL_BASED_OUTPATIENT_CLINIC_OR_DEPARTMENT_OTHER): Admitting: Internal Medicine

## 2024-01-26 ENCOUNTER — Other Ambulatory Visit: Payer: Self-pay

## 2024-01-26 ENCOUNTER — Inpatient Hospital Stay: Attending: Oncology

## 2024-01-26 VITALS — BP 116/78 | HR 72 | Temp 97.3°F | Resp 17 | Ht 70.0 in | Wt 122.0 lb

## 2024-01-26 DIAGNOSIS — R7989 Other specified abnormal findings of blood chemistry: Secondary | ICD-10-CM | POA: Diagnosis not present

## 2024-01-26 DIAGNOSIS — C641 Malignant neoplasm of right kidney, except renal pelvis: Secondary | ICD-10-CM | POA: Insufficient documentation

## 2024-01-26 DIAGNOSIS — Z9221 Personal history of antineoplastic chemotherapy: Secondary | ICD-10-CM | POA: Diagnosis not present

## 2024-01-26 DIAGNOSIS — C649 Malignant neoplasm of unspecified kidney, except renal pelvis: Secondary | ICD-10-CM | POA: Diagnosis not present

## 2024-01-26 DIAGNOSIS — N189 Chronic kidney disease, unspecified: Secondary | ICD-10-CM | POA: Insufficient documentation

## 2024-01-26 DIAGNOSIS — C787 Secondary malignant neoplasm of liver and intrahepatic bile duct: Secondary | ICD-10-CM | POA: Diagnosis not present

## 2024-01-26 DIAGNOSIS — Z79899 Other long term (current) drug therapy: Secondary | ICD-10-CM | POA: Diagnosis not present

## 2024-01-26 DIAGNOSIS — B441 Other pulmonary aspergillosis: Secondary | ICD-10-CM | POA: Insufficient documentation

## 2024-01-26 DIAGNOSIS — D692 Other nonthrombocytopenic purpura: Secondary | ICD-10-CM | POA: Insufficient documentation

## 2024-01-26 DIAGNOSIS — Z905 Acquired absence of kidney: Secondary | ICD-10-CM | POA: Diagnosis not present

## 2024-01-26 DIAGNOSIS — C7951 Secondary malignant neoplasm of bone: Secondary | ICD-10-CM | POA: Diagnosis not present

## 2024-01-26 LAB — CBC WITH DIFFERENTIAL (CANCER CENTER ONLY)
Abs Immature Granulocytes: 0.01 K/uL (ref 0.00–0.07)
Basophils Absolute: 0 K/uL (ref 0.0–0.1)
Basophils Relative: 1 %
Eosinophils Absolute: 0.1 K/uL (ref 0.0–0.5)
Eosinophils Relative: 1 %
HCT: 39.3 % (ref 39.0–52.0)
Hemoglobin: 13 g/dL (ref 13.0–17.0)
Immature Granulocytes: 0 %
Lymphocytes Relative: 15 %
Lymphs Abs: 0.8 K/uL (ref 0.7–4.0)
MCH: 31 pg (ref 26.0–34.0)
MCHC: 33.1 g/dL (ref 30.0–36.0)
MCV: 93.8 fL (ref 80.0–100.0)
Monocytes Absolute: 0.6 K/uL (ref 0.1–1.0)
Monocytes Relative: 10 %
Neutro Abs: 4.3 K/uL (ref 1.7–7.7)
Neutrophils Relative %: 73 %
Platelet Count: 182 K/uL (ref 150–400)
RBC: 4.19 MIL/uL — ABNORMAL LOW (ref 4.22–5.81)
RDW: 16.1 % — ABNORMAL HIGH (ref 11.5–15.5)
WBC Count: 5.8 K/uL (ref 4.0–10.5)
nRBC: 0 % (ref 0.0–0.2)

## 2024-01-26 LAB — CMP (CANCER CENTER ONLY)
ALT: 12 U/L (ref 0–44)
AST: 20 U/L (ref 15–41)
Albumin: 4.1 g/dL (ref 3.5–5.0)
Alkaline Phosphatase: 133 U/L — ABNORMAL HIGH (ref 38–126)
Anion gap: 5 (ref 5–15)
BUN: 27 mg/dL — ABNORMAL HIGH (ref 8–23)
CO2: 29 mmol/L (ref 22–32)
Calcium: 9.3 mg/dL (ref 8.9–10.3)
Chloride: 106 mmol/L (ref 98–111)
Creatinine: 1.34 mg/dL — ABNORMAL HIGH (ref 0.61–1.24)
GFR, Estimated: 54 mL/min — ABNORMAL LOW (ref >60)
Glucose, Bld: 120 mg/dL — ABNORMAL HIGH (ref 70–99)
Potassium: 4.8 mmol/L (ref 3.5–5.1)
Sodium: 140 mmol/L (ref 135–145)
Total Bilirubin: 0.6 mg/dL (ref 0.0–1.2)
Total Protein: 6.9 g/dL (ref 6.5–8.1)

## 2024-01-26 LAB — TSH: TSH: 4.28 u[IU]/mL (ref 0.350–4.500)

## 2024-01-26 NOTE — Progress Notes (Signed)
 [facility] [phone]   [phone]  OFFICE PROGRESS NOTE  [name], MD 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    PRIOR THERAPY: 1) Status post right open radical nephrectomy completed on October 15, 2021 by Dr. Donnice Siad.  2) Keytruda  200 Mg IV every 3 weeks started December 03, 2021.  Status post 9 cycles.  Last dose was given May 21, 2022.  This was discontinued secondary to disease progression. 3) Systemic treatment with nivolumab  480 Mg IV every 4 weeks in addition to Cabometyx  40 mg p.o. daily.  First dose June 18, 2022.  Status post 7 cycles.  His Cabometyx  has been on hold recently due to adverse side effects. His dose was reduced in May to 20 mg p.o. daily but he continues to have weakness, diarrhea, and dehydration. This has been on hold since 5/19.  Last dose was given December 02, 2022 discontinued secondary to disease progression. 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   CURRENT THERAPY: Axitinib  3 mg p.o. twice daily.  First dose started 11/03/2023.  INTERVAL HISTORY: Micheal Mcintyre 83 y.o. male returns to the clinic today for follow-up visit accompanied by his wife.Discussed the use of AI scribe software for clinical note transcription with the patient, who gave verbal consent to proceed. Micheal Mcintyre is an 83 year old male with metastatic renal cell carcinoma who presents for evaluation and repeat blood work. He is accompanied by his wife.  He has a history of metastatic renal cell carcinoma, initially diagnosed as papillary renal cell  carcinoma with rhabdoid features, with metastasis to the liver and bone since February 2024. He has been on treatment with Axitinib , 3 mg PO BID, since July 2020. He has not experienced significant side effects from the Axitinib  or Inlyta , although he has noticed some splotchiness on his leg, which he is unsure if it is related to the medication or other causes.  He underwent cataract surgery on one eye a week and a half ago and is scheduled to have the other eye operated on in three weeks.  No respiratory symptoms such as cough, shortness of breath, or chest pain. He also denies experiencing diarrhea, nausea, or vomiting. He mentions occasional mild stomach trouble at night, which he attributes to dietary causes.  Recent lab work showed a hemoglobin of 13, platelets of 182, and a serum creatinine of 1.34, which has varied slightly over time. He has gained about five pounds and reports an increased appetite.  History of Present Illness      MEDICAL HISTORY: Past Medical History:  Diagnosis Date   Arthritis    Bilateral cataracts    GERD (gastroesophageal reflux disease)    Hypertension    Hypothyroidism    Medicare annual wellness visit, subsequent 08/19/2014   Osteoarthritis 09/21/2007   Qualifier: Diagnosis of  By: Viviann Raddle MD, Marsha R    PONV (postoperative nausea and vomiting)    Preventative health care 06/07/2016   Right shoulder pain 12/03/2016    ALLERGIES:  is allergic to statins.  MEDICATIONS:  Current Outpatient Medications  Medication Sig Dispense Refill   acetaminophen  (TYLENOL ) 500 MG tablet Take 1,000 mg by mouth every 6 (six) hours as needed for moderate pain.     amLODipine  (NORVASC ) 5 MG tablet Take 5 mg by mouth daily. Take only when systolic is greater than 140     axitinib  (INLYTA ) 1 MG tablet Take 3 tablets (3 mg total) by mouth 2 (two) times daily. 180 tablet 3   chlorthalidone (HYGROTON) 25 MG tablet Take 25 mg by mouth every other day.      furosemide (LASIX) 20 MG tablet Take 20 mg by mouth daily as needed.     levothyroxine  (SYNTHROID ) 50 MCG tablet Take 1 tablet (50 mcg total) by mouth daily before breakfast. 30 tablet 1   Multiple Vitamin (MULTIVITAMIN WITH MINERALS) TABS tablet Take 1 tablet by mouth 4 (four) times a week.     telmisartan  (MICARDIS ) 80 MG tablet TAKE 1/2 TABLET BY MOUTH TWICE DAILY 180 tablet 1   voriconazole  (VFEND ) 200 MG tablet Take 1 tablet (200 mg total) by mouth 2 (two) times daily. 60 tablet 3   No current facility-administered medications for this visit.    SURGICAL HISTORY:  Past Surgical History:  Procedure Laterality Date   BRONCHIAL BIOPSY  08/17/2023   Procedure: BRONCHOSCOPY, WITH BIOPSY;  Surgeon: Shelah Lamar RAMAN, MD;  Location: [facility] ENDOSCOPY;  Service: Pulmonary;;   BRONCHIAL BRUSHINGS  08/17/2023   Procedure: BRONCHOSCOPY, WITH BRUSH BIOPSY;  Surgeon: Shelah Lamar RAMAN, MD;  Location: [facility] ENDOSCOPY;  Service: Pulmonary;;   BRONCHIAL NEEDLE ASPIRATION BIOPSY  08/17/2023   Procedure: BRONCHOSCOPY, WITH NEEDLE ASPIRATION BIOPSY;  Surgeon: Shelah Lamar RAMAN, MD;  Location: [facility] ENDOSCOPY;  Service: Pulmonary;;   COLONOSCOPY     HIP SURGERY Left    1991   JOINT REPLACEMENT Right 2005   hip   NEPHRECTOMY Right 10/15/2021   Procedure: NEPHRECTOMY, OPEN;  Surgeon: Selma Donnice SAUNDERS, MD;  Location: [facility] ORS;  Service: Urology;  Laterality: Right;   nose skin cancer removal      REVIEW OF SYSTEMS:  A comprehensive review of systems was negative except for: Constitutional: positive for fatigue   PHYSICAL EXAMINATION: General appearance: alert, cooperative, fatigued, and no distress Head: Normocephalic, without obvious abnormality, atraumatic Neck: no adenopathy, no JVD, supple, symmetrical, trachea midline, and thyroid  not enlarged, symmetric, no tenderness/mass/nodules Lymph nodes: Cervical, supraclavicular, and axillary nodes normal. Resp: clear to auscultation bilaterally Back: symmetric, no curvature.  ROM normal. No CVA tenderness. Cardio: regular rate and rhythm, S1, S2 normal, no murmur, click, rub or gallop GI: soft, non-tender; bowel sounds normal; no masses,  no organomegaly Extremities: extremities normal, atraumatic, no cyanosis or edema  ECOG PERFORMANCE STATUS: 1 - Symptomatic but completely ambulatory  Blood pressure 116/78, pulse 72, temperature (!) 97.3 F (36.3 C), resp. rate 17, height 5' 10 (1.778 m), weight 122 lb (55.3 kg), SpO2 96%.  LABORATORY DATA: Lab Results  Component Value Date   WBC 5.8 01/26/2024   HGB 13.0 01/26/2024   HCT 39.3 01/26/2024   MCV 93.8 01/26/2024   PLT 182 01/26/2024      Chemistry      Component Value Date/Time   NA 140 01/26/2024 0953   NA 140 04/22/2022 0000   K 4.8 01/26/2024 0953   CL 106 01/26/2024 0953   CO2 29 01/26/2024 0953   BUN 27 (H) 01/26/2024 0953   BUN 21 04/22/2022 0000   CREATININE 1.34 (H) 01/26/2024 9046  CREATININE 1.50 (H) 11/25/2023 1124   GLU 88 04/22/2022 0000      Component Value Date/Time   CALCIUM  9.3 01/26/2024 0953   ALKPHOS 133 (H) 01/26/2024 0953   AST 20 01/26/2024 0953   ALT 12 01/26/2024 0953   BILITOT 0.6 01/26/2024 0953       RADIOGRAPHIC STUDIES: No results found.    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  He is status post right open radical nephrectomy in June 2023 followed by 9 cycles of treatment with immunotherapy with Keytruda  last dose was given May 21, 2022 before it was discontinued secondary to disease progression.  The patient then started systemic chemotherapy with nivolumab  and Cabometyx  initially at 40 mg p.o. daily but this was reduced to 20 mg p.o. daily before it was discontinued secondary to intolerance.  His treatment with  nivolumab  was discontinued after 7 cycles secondary to disease progression.  The patient was seen by Dr. Joyice at [facility] for a second opinion and she recommended for the patient treatment with lenvatinib  and everolimus  starting at a reduced dose. He is currently on treatment with lenvatinib  14 mg p.o. daily in addition to everolimus  5 mg p.o. daily started February 02, 2023.  He has been tolerating this treatment fairly well except for occasional diarrhea.  His treatment has been on hold since July 07, 2023 secondary to suspicious pulmonary infection/inflammatory process. He had repeat CT scan of the chest performed recently.  I personally independently reviewed the scan and discussed the result with the patient and his wife.  His scan showed redemonstration of the cavitary lesions in the right upper lobe and left upper lobe with the right upper lobe lesion slightly decreased in size.  There is also multiple pulmonary nodules consistent with metastatic disease several centimeters opdualag..  With the prior exam.  There was also slight increase in AP window lymph nodes could be reactive versus metastatic.  There is decrease in the hypodense liver lesion compared to the prior exam. The patient is currently on treatment with voriconazole  for pulmonary aspergillus. He is currently undergoing treatment with axitinib  3 mg p.o. twice daily started November 03, 2023 and tolerated it fairly well. He has repeat blood work today that is unremarkable for any concerning findings. 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 Currently on treatment with Axitinib  3 mg PO daily since July 2025. No significant side effects reported from Inlyta . Reports some splotchiness on the leg, likely senile purpura rather than a  drug reaction. Lab work shows stable hemoglobin and platelet counts, with a slight increase in serum creatinine, but no major concerns. No gastrointestinal side effects such as diarrhea or nausea. Weight gain of approximately five pounds, indicating good nutritional status. - Continue Axitinib  3 mg PO daily - Monitor blood work monthly - Await infectious disease clearance regarding the pulmonary fungal infection before scheduling next scan  Chronic kidney disease Chronic kidney disease with serum creatinine at 1.34, slightly elevated from the normal range for men (1.24).  Senile purpura Senile purpura noted on the legs, likely due to age-related changes rather than medication side  effects. Assessment & Plan   He was advised to call immediately if he has any concerning symptoms in the interval. The patient voices understanding of current disease status and treatment options and is in agreement with the current care plan.  All questions were answered. The patient knows to call the clinic with any problems, questions or concerns. We can certainly see the patient much sooner if necessary.  The total time spent in the appointment was 20 minutes.  Disclaimer: This note was dictated with voice recognition software. Similar sounding words can inadvertently be transcribed and may not be corrected upon review.

## 2024-01-27 ENCOUNTER — Other Ambulatory Visit: Payer: Self-pay

## 2024-01-28 ENCOUNTER — Telehealth: Payer: Self-pay | Admitting: Neurology

## 2024-01-28 NOTE — Telephone Encounter (Signed)
 FYI   Copied from CRM [id number]. Topic: General - Call Back - No Documentation >> Jan 28, 2024  2:56 PM Viola F wrote: Patient returned Micheal Mcintyre's phone call, wants to let her know that he picked up the form for handicap sticker and he's all set

## 2024-01-28 NOTE — Telephone Encounter (Signed)
 Patient's wife was advised that placard form is ready to be picked up.

## 2024-01-31 DIAGNOSIS — H35361 Drusen (degenerative) of macula, right eye: Secondary | ICD-10-CM | POA: Diagnosis not present

## 2024-01-31 DIAGNOSIS — H2511 Age-related nuclear cataract, right eye: Secondary | ICD-10-CM | POA: Diagnosis not present

## 2024-01-31 DIAGNOSIS — H353111 Nonexudative age-related macular degeneration, right eye, early dry stage: Secondary | ICD-10-CM | POA: Diagnosis not present

## 2024-01-31 DIAGNOSIS — D3131 Benign neoplasm of right choroid: Secondary | ICD-10-CM | POA: Diagnosis not present

## 2024-02-01 ENCOUNTER — Encounter: Payer: Self-pay | Admitting: Internal Medicine

## 2024-02-01 ENCOUNTER — Other Ambulatory Visit: Payer: Self-pay

## 2024-02-02 ENCOUNTER — Other Ambulatory Visit: Payer: Self-pay

## 2024-02-02 ENCOUNTER — Encounter: Payer: Self-pay | Admitting: Internal Medicine

## 2024-02-03 ENCOUNTER — Other Ambulatory Visit: Payer: Self-pay

## 2024-02-03 ENCOUNTER — Encounter: Payer: Self-pay | Admitting: Internal Medicine

## 2024-02-03 ENCOUNTER — Ambulatory Visit (INDEPENDENT_AMBULATORY_CARE_PROVIDER_SITE_OTHER): Admitting: Internal Medicine

## 2024-02-03 VITALS — BP 118/68 | HR 68 | Temp 97.6°F | Ht 70.0 in | Wt 122.8 lb

## 2024-02-03 DIAGNOSIS — C649 Malignant neoplasm of unspecified kidney, except renal pelvis: Secondary | ICD-10-CM

## 2024-02-03 DIAGNOSIS — B440 Invasive pulmonary aspergillosis: Secondary | ICD-10-CM | POA: Diagnosis not present

## 2024-02-03 NOTE — Patient Instructions (Signed)
 Please stop voriconazole  today   I have ordered ct chest and blood test today for baseline    If in the future you have other pneumonia and workup is needed, I advise for team to call Infectious Disease to get involved early as sometimes its difficult to tease out different infection (eventhough you would not be someone I put aspergillus as my first thing in mind)

## 2024-02-03 NOTE — Progress Notes (Signed)
 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   CRI (chronic renal insufficiency) 03/30/2022   Cancer of kidney (HCC) 11/18/2021   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   Acquired leg length discrepancy 07/15/2021   Lumbar radiculopathy 07/15/2021   Other idiopathic scoliosis, 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/15/2021   Muscle cramps 07/08/2018   Sun-damaged skin 12/06/2016   Right shoulder pain 12/03/2016   Preventative health care 06/07/2016   Vitamin D  deficiency 06/02/2015   Medicare annual wellness visit, subsequent 08/19/2014   Hyperglycemia 05/25/2012   Hypothyroidism 03/14/2009   Anemia 03/14/2009   FREQUENCY, URINARY 03/14/2009   UNS ADVRS EFF OTH RX MEDICINAL&BIOLOGICAL SBSTNC 03/14/2009   ERECTILE DYSFUNCTION 10/19/2007   Hyperlipidemia, mixed 09/21/2007   Osteoarthritis 09/21/2007   Essential hypertension 01/12/2007   GERD 01/12/2007      HPI: Micheal Mcintyre is a 83 y.o. male with renal cancer s/p right nephrectomy 2023, subsequent metastatic to lung/liver, on chemo (everolimus  and lenvatinib  tyrsine kinase inhibitor -- since 03/2023), referred to us  due to bronch culture with aspergillus  Patient has had ct scan 06/30/23 for cancer monitoring. There was left upper lobe and left upper lobe cavirary lesion. Patient was coughing (no hemoptysis, fever, chill, nightsweat, fatigue, flulike illness).   Repeat 08/09/23 ct chest showed enlarged  and increased numbers/increased size of cavitation  He was sent to pulm for bronch 08/17/2023. Biopsies were negative for malignancy but culture was positive for aspergillus  Patient was taken off chemo in 07/2023 and started on vori  in August 17, 2023  Patient doesn't recall feeling sick in anyway except when he was on cancer treatment  He last saw pulm 09/24/23. 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 Hx right hip replacement 2005  Walks with a cane  He feels really well off chemo   11/25/23 id clinic f/u Patient here with wife No concern today Reviewed labs/imaging See a&p for detail    02/03/24 id clinic f/u See a&p for detail     Review of Systems: ROS All other ros negative      Past Medical History:  Diagnosis Date   Arthritis    Bilateral cataracts    GERD (gastroesophageal reflux disease)    Hypertension    Hypothyroidism    Medicare annual wellness visit, subsequent 08/19/2014   Osteoarthritis 09/21/2007   Qualifier: Diagnosis of  By: Viviann Raddle MD, Marsha R    PONV (postoperative nausea and vomiting)    Preventative health care 06/07/2016   Right shoulder pain 12/03/2016    Social History   Tobacco Use   Smoking status: Never    Passive exposure: Never   Smokeless tobacco: Never  Vaping Use   Vaping status: Never Used  Substance Use Topics   Alcohol use: Not Currently    Comment: occ beer   Drug use: No    Family History  Problem Relation Age of Onset   Heart attack Father 68  deceased   Diabetes Maternal Uncle        maternal grandfather   Hypertension Neg Hx    Breast cancer Neg Hx    Colon cancer Neg Hx    Prostate cancer Neg Hx     Allergies  Allergen Reactions   Statins Other (See Comments)    myalgia    OBJECTIVE: There were no vitals filed for this visit.  There is no height or weight on file to calculate BMI.   Physical Exam General/constitutional: no distress, pleasant HEENT: Normocephalic, PER, Conj Clear, EOMI, Oropharynx clear Neck supple CV: rrr no  mrg Lungs: clear to auscultation, normal respiratory effort Abd: Soft, Nontender Ext: no edema Skin: No Rash Neuro: nonfocal MSK: walks with cane; slightly shorter left lower ext  Lab: Lab Results  Component Value Date   WBC 5.8 01/26/2024   HGB 13.0 01/26/2024   HCT 39.3 01/26/2024   MCV 93.8 01/26/2024   PLT 182 01/26/2024   Last metabolic panel Lab Results  Component Value Date   GLUCOSE 120 (H) 01/26/2024   NA 140 01/26/2024   K 4.8 01/26/2024   CL 106 01/26/2024   CO2 29 01/26/2024   BUN 27 (H) 01/26/2024   CREATININE 1.34 (H) 01/26/2024   GFRNONAA 54 (L) 01/26/2024   CALCIUM  9.3 01/26/2024   PHOS 3.4 05/19/2013   PROT 6.9 01/26/2024   ALBUMIN  4.1 01/26/2024   BILITOT 0.6 01/26/2024   ALKPHOS 133 (H) 01/26/2024   AST 20 01/26/2024   ALT 12 01/26/2024   ANIONGAP 5 01/26/2024    Microbiology:  Serology: 10/14/23 aspergillus Ag negative    Imaging: Reviewed   11/19/23 chest ct Cavitary areas again noted in the upper lobes bilaterally with decreasing nodularity and soft tissue components as well as decreasing airspace disease surrounding the right upper lobe cavity.   Tree-in-bud nodular densities throughout the lungs most compatible with infectious or inflammatory small airways disease.   Numerous bilateral pulmonary nodules measuring up to 1.3 cm in the left lower lobe compatible with metastases. Overall, nodules appear essentially stable since prior study.   Low-density areas within the liver concerning for metastases. These would be better visualized, evaluated and followed with abdominal CT with IV contrast.        09/07/23 ct chest CT Chest 09/07/2023 Interval decrease in size of the 2.7 cm pleural-based mass with partial cavitation laterally in the right upper lobe. 2. Interval decrease in size of the 3.2 cm cavitary lesion laterally in the left upper lobe. 3. Interval new 1.2 cm triangular nodule or nodular consolidation in the left  upper lobe. 4. Interval decrease in size of the previous central ground-glass and peripheral rim consolidation in the posterior left lower lobe, now more confluent and homogeneous consolidation. 5. Innumerable bilateral pulmonary nodules, some of which are associated with peribronchovascular tree-in-bud interstitial change, and therefore likely inflammatory but majority are almost certainly metastatic. 6. Index nodules are slightly larger in the interval. 7. Hypodense presumably metastases in the liver are slightly smaller in the interval. 8. 1.1 cm hyperdense lesion in the pancreatic tail is unchanged, possible metastasis. 9. Aortic atherosclerosis.       Assessment/plan: Problem List Items Addressed This Visit     Cancer of kidney [facility])   Relevant Orders   CT CHEST WO CONTRAST   Other Visit Diagnoses       Invasive pulmonary aspergillosis (HCC)    -  Primary   Relevant Orders   CT CHEST WO CONTRAST  Aspergillus Antigen, Serum           Immunosuppressed (everolimus  and and lenvima ) Renal cancer Pulm aspergillus  Surprised to see he has aspergillus with these 2 cancer drugs Bx no malignancy but culture positive and worsening chest ct prior to vori, improved on it  I Jeffree't have any pretreatment aspergillus ag   Will track aspergillus ag Cbc/cmp already done At least 3 months vori Agree repeat chest ct in 4-6 weeks  F/u wth me in 6 weeks  If urgent treatment for cancer needed, no contraindication from my standpoint as long as voriconazole  is continued   ------------- 11/25/23 id clinic assessment Patient remains assymptomatic from respiratory symptoms. No cough/chest pain  CT chest to my read showed improved/resolved parenchymal air-space disease and the fungal ball seems smaller as well   He has no pre-antibiotics galactomannan. But on antifungal serum aspergillus ag was negative    I have high suspicion that the infection is treated but will  repeat testing/ct scan again in around 4-6 weeks if stable or improved will stop antifungal  Of note, the fungal ball might never go away  He has no hair loss, skin rash, n/v, visual problems and given clinical status will not need voriconazole  level today  Continue voriconazole  200 mg bid for now   ---------- 02/03/24 id assessment Still on voriconazole  No side effects Previous 11/2023 chest ct essentially stable nodule/cavitary lesion Ongoing aspergillus gm negative 10/14/23 and 11/25/23  Will repeat the aspergillus gm blood test today and repeat another ct just for baseline fungal ball assessment  Again we discussed while he is immunosuppressed, he is not a host who would be technically at high risk for aspergillus. Infact everolimus  does have some mechanistic effect against aspergillus (he is on Inlyta  now since voriconazole  started)   Stop voriconazole  today Will review ct and lab with him With serial ct and labs, perhaps later if some lung complication/pna occur, we can reassure ourselves more against aspergillus. A fungal ball is expected to remain    Follow-up: Return if symptoms worsen or fail to improve.  Constance ONEIDA Passer, MD [facility] 02/03/2024, 9:35 AM

## 2024-02-03 NOTE — Progress Notes (Signed)
 Specialty Pharmacy Ongoing Clinical Assessment Note  Micheal Mcintyre is a 83 y.o. male who is being followed by the specialty pharmacy service for RxSp Oncology   Patient's specialty medication(s) reviewed today: Axitinib  (INLYTA )   Missed doses in the last 4 weeks: 0   Patient/Caregiver did not have any additional questions or concerns.   Therapeutic benefit summary: Patient is achieving benefit   Adverse events/side effects summary: Experienced adverse events/side effects (The patient reported tolerable fatigue and petechiae.  I counseled him on signs and symptoms of bleeding including dark tarry stools.  He expressed understanding.)   Patient's therapy is appropriate to: Continue    Goals Addressed             This Visit's Progress    Slow Disease Progression   No change    Patient is unable to be assessed as therapy was recently initiated. Patient will maintain adherence.   Per office visit notes, his scan from 11/19/23 showed redemonstration of the cavitary lesions in the right upper lobe and left upper lobe with the right upper lobe lesion slightly decreased in size.  There is also multiple pulmonary nodules consistent with metastatic disease several centimeters opdualag..  With the prior exam.  There was also slight increase in AP window lymph nodes could be reactive versus metastatic.  There is decrease in the hypodense liver lesion compared to the prior exam. The patient is currently on treatment with voriconazole  for pulmonary aspergillus.  His treatment was started on 11/03/23 and the next scan results will be a better indicator if therapy is producing benefit.          Follow up: 3 months  [name] Specialty Pharmacist

## 2024-02-08 LAB — ASPERGILLUS ANTIGEN,SERUM
Aspergillus Ag, EIA: NOT DETECTED
Index Value: 0.05 (ref <0.50)

## 2024-02-09 ENCOUNTER — Ambulatory Visit: Payer: Self-pay | Admitting: Internal Medicine

## 2024-02-10 ENCOUNTER — Ambulatory Visit: Admitting: Internal Medicine

## 2024-02-11 ENCOUNTER — Other Ambulatory Visit (HOSPITAL_BASED_OUTPATIENT_CLINIC_OR_DEPARTMENT_OTHER)

## 2024-02-18 ENCOUNTER — Ambulatory Visit (HOSPITAL_BASED_OUTPATIENT_CLINIC_OR_DEPARTMENT_OTHER)
Admission: RE | Admit: 2024-02-18 | Discharge: 2024-02-18 | Disposition: A | Source: Ambulatory Visit | Attending: Internal Medicine

## 2024-02-18 ENCOUNTER — Other Ambulatory Visit: Payer: Self-pay | Admitting: Internal Medicine

## 2024-02-18 ENCOUNTER — Other Ambulatory Visit: Payer: Self-pay

## 2024-02-18 DIAGNOSIS — C649 Malignant neoplasm of unspecified kidney, except renal pelvis: Secondary | ICD-10-CM | POA: Diagnosis not present

## 2024-02-18 DIAGNOSIS — R918 Other nonspecific abnormal finding of lung field: Secondary | ICD-10-CM | POA: Diagnosis not present

## 2024-02-18 DIAGNOSIS — B440 Invasive pulmonary aspergillosis: Secondary | ICD-10-CM | POA: Insufficient documentation

## 2024-02-21 ENCOUNTER — Other Ambulatory Visit: Payer: Self-pay

## 2024-02-21 MED ORDER — AXITINIB 1 MG PO TABS
3.0000 mg | ORAL_TABLET | Freq: Two times a day (BID) | ORAL | 3 refills | Status: DC
  Filled 2024-02-21 – 2024-02-22 (×2): qty 180, 30d supply, fill #0

## 2024-02-22 ENCOUNTER — Other Ambulatory Visit: Payer: Self-pay

## 2024-02-22 DIAGNOSIS — H268 Other specified cataract: Secondary | ICD-10-CM | POA: Diagnosis not present

## 2024-02-22 DIAGNOSIS — H25811 Combined forms of age-related cataract, right eye: Secondary | ICD-10-CM | POA: Diagnosis not present

## 2024-02-22 NOTE — Progress Notes (Signed)
 Specialty Pharmacy Refill Coordination Note  Micheal Mcintyre is a 83 y.o. male contacted today regarding refills of specialty medication(s) Axitinib  (INLYTA )   Patient requested Delivery   Delivery date: 02/24/24   Verified address: [address] [name], [place]   Medication will be filled on 02/23/24.

## 2024-02-23 ENCOUNTER — Other Ambulatory Visit: Payer: Self-pay

## 2024-02-24 ENCOUNTER — Inpatient Hospital Stay: Attending: Oncology

## 2024-02-24 ENCOUNTER — Telehealth: Payer: Self-pay | Admitting: Pharmacist

## 2024-02-24 ENCOUNTER — Inpatient Hospital Stay (HOSPITAL_BASED_OUTPATIENT_CLINIC_OR_DEPARTMENT_OTHER): Admitting: Internal Medicine

## 2024-02-24 VITALS — BP 124/72 | HR 67 | Temp 97.8°F | Resp 17 | Ht 70.0 in | Wt 126.0 lb

## 2024-02-24 DIAGNOSIS — M129 Arthropathy, unspecified: Secondary | ICD-10-CM | POA: Diagnosis not present

## 2024-02-24 DIAGNOSIS — Z79890 Hormone replacement therapy (postmenopausal): Secondary | ICD-10-CM | POA: Insufficient documentation

## 2024-02-24 DIAGNOSIS — C649 Malignant neoplasm of unspecified kidney, except renal pelvis: Secondary | ICD-10-CM

## 2024-02-24 DIAGNOSIS — I700 Atherosclerosis of aorta: Secondary | ICD-10-CM | POA: Diagnosis not present

## 2024-02-24 DIAGNOSIS — E860 Dehydration: Secondary | ICD-10-CM | POA: Diagnosis not present

## 2024-02-24 DIAGNOSIS — K219 Gastro-esophageal reflux disease without esophagitis: Secondary | ICD-10-CM | POA: Diagnosis not present

## 2024-02-24 DIAGNOSIS — M1990 Unspecified osteoarthritis, unspecified site: Secondary | ICD-10-CM | POA: Insufficient documentation

## 2024-02-24 DIAGNOSIS — Z79899 Other long term (current) drug therapy: Secondary | ICD-10-CM | POA: Insufficient documentation

## 2024-02-24 DIAGNOSIS — C641 Malignant neoplasm of right kidney, except renal pelvis: Secondary | ICD-10-CM

## 2024-02-24 DIAGNOSIS — B441 Other pulmonary aspergillosis: Secondary | ICD-10-CM | POA: Diagnosis not present

## 2024-02-24 DIAGNOSIS — E039 Hypothyroidism, unspecified: Secondary | ICD-10-CM | POA: Diagnosis not present

## 2024-02-24 DIAGNOSIS — C7951 Secondary malignant neoplasm of bone: Secondary | ICD-10-CM | POA: Diagnosis not present

## 2024-02-24 DIAGNOSIS — C787 Secondary malignant neoplasm of liver and intrahepatic bile duct: Secondary | ICD-10-CM | POA: Diagnosis not present

## 2024-02-24 LAB — CMP (CANCER CENTER ONLY)
ALT: 17 U/L (ref 0–44)
AST: 20 U/L (ref 15–41)
Albumin: 4.2 g/dL (ref 3.5–5.0)
Alkaline Phosphatase: 121 U/L (ref 38–126)
Anion gap: 7 (ref 5–15)
BUN: 24 mg/dL — ABNORMAL HIGH (ref 8–23)
CO2: 29 mmol/L (ref 22–32)
Calcium: 9.3 mg/dL (ref 8.9–10.3)
Chloride: 105 mmol/L (ref 98–111)
Creatinine: 1.23 mg/dL (ref 0.61–1.24)
GFR, Estimated: 59 mL/min — ABNORMAL LOW (ref >60)
Glucose, Bld: 137 mg/dL — ABNORMAL HIGH (ref 70–99)
Potassium: 4.5 mmol/L (ref 3.5–5.1)
Sodium: 141 mmol/L (ref 135–145)
Total Bilirubin: 0.6 mg/dL (ref 0.0–1.2)
Total Protein: 7.1 g/dL (ref 6.5–8.1)

## 2024-02-24 LAB — CBC WITH DIFFERENTIAL (CANCER CENTER ONLY)
Abs Immature Granulocytes: 0.02 K/uL (ref 0.00–0.07)
Basophils Absolute: 0.1 K/uL (ref 0.0–0.1)
Basophils Relative: 1 %
Eosinophils Absolute: 0 K/uL (ref 0.0–0.5)
Eosinophils Relative: 0 %
HCT: 40.9 % (ref 39.0–52.0)
Hemoglobin: 13.7 g/dL (ref 13.0–17.0)
Immature Granulocytes: 0 %
Lymphocytes Relative: 10 %
Lymphs Abs: 0.7 K/uL (ref 0.7–4.0)
MCH: 31.9 pg (ref 26.0–34.0)
MCHC: 33.5 g/dL (ref 30.0–36.0)
MCV: 95.1 fL (ref 80.0–100.0)
Monocytes Absolute: 0.5 K/uL (ref 0.1–1.0)
Monocytes Relative: 8 %
Neutro Abs: 5.4 K/uL (ref 1.7–7.7)
Neutrophils Relative %: 81 %
Platelet Count: 178 K/uL (ref 150–400)
RBC: 4.3 MIL/uL (ref 4.22–5.81)
RDW: 15 % (ref 11.5–15.5)
WBC Count: 6.8 K/uL (ref 4.0–10.5)
nRBC: 0 % (ref 0.0–0.2)

## 2024-02-24 LAB — LACTATE DEHYDROGENASE: LDH: 150 U/L (ref 98–192)

## 2024-02-24 NOTE — Progress Notes (Signed)
 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    PRIOR THERAPY: 1) Status post right open radical nephrectomy completed on October 15, 2021 by Dr. Donnice Siad.  2) Keytruda  200 Mg IV every 3 weeks started December 03, 2021.  Status post 9 cycles.  Last dose was given May 21, 2022.  This was discontinued secondary to disease progression. 3) Systemic treatment with nivolumab  480 Mg IV every 4 weeks in addition to Cabometyx  40 mg p.o. daily.  First dose June 18, 2022.  Status post 7 cycles.  His Cabometyx  has been on hold recently due to adverse side effects. His dose was reduced in May to 20 mg p.o. daily but he continues to have weakness, diarrhea, and dehydration. This has been on hold since 5/19.  Last dose was given December 02, 2022 discontinued secondary to disease progression. 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   CURRENT THERAPY: Axitinib  3 mg p.o. twice daily.  First dose started 11/03/2023.  INTERVAL HISTORY: Micheal Mcintyre 83 y.o. male returns to the clinic today for follow-up visit accompanied by his wife. Discussed the use of AI scribe software for clinical note transcription with the patient, who gave verbal consent to proceed.  History of Present Illness Micheal Mcintyre is an 83 year old male with metastatic renal cell carcinoma who presents for evaluation and repeat CT scan for restaging of his disease.  He was diagnosed with metastatic renal cell carcinoma in February 2024,  with metastases to the liver and bones. The histology showed papillary renal cell carcinoma with rhabdoid features. He has undergone multiple treatment regimens, including immunotherapy, cabozantinib , and lenvatinib  plus everolimus , which was discontinued due to an interaction with voriconazole  used for treating pulmonary aspergillus. Currently, he is on axitinib , 3 mg twice daily since July 2025.  He recently underwent cataract surgery on both eyes, with the most recent surgery occurring on Tuesday. He felt weak the day after the surgery due to the medication used during the procedure. He has gained a few pounds since his last visit, attributing it to increased eating.  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 He still has cavitary lesions in the lungs.  He is currently taking axitinib , 3 mg twice daily, and has about three weeks' worth of medication left at home. He is expecting a new bottle of medication soon.   MEDICAL HISTORY: Past Medical History:  Diagnosis Date   Arthritis    Bilateral cataracts    GERD (gastroesophageal reflux disease)    Hypertension    Hypothyroidism    Medicare annual wellness visit, subsequent 08/19/2014   Osteoarthritis 09/21/2007   Qualifier: Diagnosis of  By: Viviann Raddle MD, Marsha R    PONV (postoperative nausea and vomiting)    Preventative health care 06/07/2016   Right shoulder pain 12/03/2016    ALLERGIES:  is allergic to statins.  MEDICATIONS:  Current Outpatient Medications  Medication Sig  Dispense Refill   acetaminophen  (TYLENOL ) 500 MG tablet Take 1,000 mg by mouth every 6 (six) hours as needed for moderate pain.     amLODipine  (NORVASC ) 5 MG tablet Take 5 mg by mouth daily. Take only when systolic is greater than 140     axitinib  (INLYTA ) 1 MG tablet Take 3 tablets (3 mg total) by mouth 2 (two) times daily. 180 tablet 3   chlorthalidone (HYGROTON) 25 MG tablet Take 25 mg by  mouth every other day.     furosemide (LASIX) 20 MG tablet Take 20 mg by mouth daily as needed.     levothyroxine  (SYNTHROID ) 50 MCG tablet TAKE 1 TABLET(50 MCG) BY MOUTH DAILY BEFORE BREAKFAST 30 tablet 1   levothyroxine  (SYNTHROID ) 50 MCG tablet Take 1 tablet (50 mcg total) by mouth daily before breakfast. 30 tablet 1   Multiple Vitamin (MULTIVITAMIN WITH MINERALS) TABS tablet Take 1 tablet by mouth 4 (four) times a week.     telmisartan  (MICARDIS ) 80 MG tablet TAKE 1/2 TABLET BY MOUTH TWICE DAILY 180 tablet 1   No current facility-administered medications for this visit.    SURGICAL HISTORY:  Past Surgical History:  Procedure Laterality Date   BRONCHIAL BIOPSY  08/17/2023   Procedure: BRONCHOSCOPY, WITH BIOPSY;  Surgeon: Shelah Lamar RAMAN, MD;  Location: [facility] ENDOSCOPY;  Service: Pulmonary;;   BRONCHIAL BRUSHINGS  08/17/2023   Procedure: BRONCHOSCOPY, WITH BRUSH BIOPSY;  Surgeon: Shelah Lamar RAMAN, MD;  Location: [facility] ENDOSCOPY;  Service: Pulmonary;;   BRONCHIAL NEEDLE ASPIRATION BIOPSY  08/17/2023   Procedure: BRONCHOSCOPY, WITH NEEDLE ASPIRATION BIOPSY;  Surgeon: Shelah Lamar RAMAN, MD;  Location: [facility] ENDOSCOPY;  Service: Pulmonary;;   COLONOSCOPY     HIP SURGERY Left    1991   JOINT REPLACEMENT Right 2005   hip   NEPHRECTOMY Right 10/15/2021   Procedure: NEPHRECTOMY, OPEN;  Surgeon: Selma Donnice SAUNDERS, MD;  Location: [facility] ORS;  Service: Urology;  Laterality: Right;   nose skin cancer removal      REVIEW OF SYSTEMS:  Constitutional: positive for fatigue Eyes: negative Ears, nose, mouth, throat, and face: negative Respiratory: positive for dyspnea on exertion Cardiovascular: negative Gastrointestinal: negative Genitourinary:negative Integument/breast: negative Hematologic/lymphatic: negative Musculoskeletal:positive for arthralgias Neurological: negative Behavioral/Psych: negative Endocrine: negative Allergic/Immunologic: negative   PHYSICAL EXAMINATION: General appearance: alert,  cooperative, fatigued, and no distress Head: Normocephalic, without obvious abnormality, atraumatic Neck: no adenopathy, no JVD, supple, symmetrical, trachea midline, and thyroid  not enlarged, symmetric, no tenderness/mass/nodules Lymph nodes: Cervical, supraclavicular, and axillary nodes normal. Resp: clear to auscultation bilaterally Back: symmetric, no curvature. ROM normal. No CVA tenderness. Cardio: regular rate and rhythm, S1, S2 normal, no murmur, click, rub or gallop GI: soft, non-tender; bowel sounds normal; no masses,  no organomegaly Extremities: extremities normal, atraumatic, no cyanosis or edema Neurologic: Alert and oriented X 3, normal strength and tone. Normal symmetric reflexes. Normal coordination and gait  ECOG PERFORMANCE STATUS: 1 - Symptomatic but completely ambulatory  Blood pressure 124/72, pulse 67, temperature 97.8 F (36.6 C), temperature source Temporal, resp. rate 17, height 5' 10 (1.778 m), weight 126 lb (57.2 kg), SpO2 98%.  LABORATORY DATA: Lab Results  Component Value Date   WBC 6.8 02/24/2024   HGB 13.7 02/24/2024   HCT 40.9 02/24/2024   MCV 95.1 02/24/2024   PLT 178 02/24/2024      Chemistry      Component Value Date/Time   NA 140 01/26/2024 0953   NA 140 04/22/2022 0000   K 4.8 01/26/2024 0953  CL 106 01/26/2024 0953   CO2 29 01/26/2024 0953   BUN 27 (H) 01/26/2024 0953   BUN 21 04/22/2022 0000   CREATININE 1.34 (H) 01/26/2024 0953   CREATININE 1.50 (H) 11/25/2023 1124   GLU 88 04/22/2022 0000      Component Value Date/Time   CALCIUM  9.3 01/26/2024 0953   ALKPHOS 133 (H) 01/26/2024 0953   AST 20 01/26/2024 0953   ALT 12 01/26/2024 0953   BILITOT 0.6 01/26/2024 0953       RADIOGRAPHIC STUDIES: CT CHEST WO CONTRAST Result Date: 02/22/2024 CLINICAL DATA:  Fungus ball, aspergillosis, malignant neoplasm of kidney * Tracking Code: BO * EXAM: CT CHEST WITHOUT CONTRAST TECHNIQUE: Multidetector CT imaging of the chest was performed  following the standard protocol without IV contrast. RADIATION DOSE REDUCTION: This exam was performed according to the departmental dose-optimization program which includes automated exposure control, adjustment of the mA and/or kV according to patient size and/or use of iterative reconstruction technique. COMPARISON:  CT chest abdomen pelvis, 12/27/2023 FINDINGS: Cardiovascular: Scattered aortic atherosclerosis. Normal heart size. No pericardial effusion. Mediastinum/Nodes: No enlarged mediastinal, hilar, or axillary lymph nodes. Thyroid  gland, trachea, and esophagus demonstrate no significant findings. Lungs/Pleura: No significant change in a complex constellation of findings, including numerous discrete pulmonary nodules throughout both lungs, index nodule in the dependent right lower lobe measuring 1.0 cm (series 302, image 72), innumerable superimposed clustered centrilobular and tree-in-bud nodules, some of which are cavitary (e.g. Series 302, image 66, 74), and larger, discrete cavitary lesions, a thin walled lesion in the peripheral left upper lobe measuring 4.1 x 3.5 cm (series 302, image 49) and a slightly thick-walled lesion in the peripheral right upper lobe measuring 2.3 x 2.0 cm, both unchanged (series 302, image 31). No pleural effusion or pneumothorax. Upper Abdomen: No acute abnormality. Multiple hypodense liver lesions, not clearly assessed by this noncontrast examination of the chest but not obviously changed. Musculoskeletal: No chest wall abnormality. No acute osseous findings. Severe right glenohumeral arthrosis. IMPRESSION: 1. No significant change in a complex constellation of findings, including numerous discrete pulmonary nodules throughout both lungs, innumerable superimposed clustered centrilobular and tree-in-bud nodules, some of which are cavitary, and larger, discrete cavitary lesions. Findings again suggest some combination of pulmonary metastases, atypical mycobacterial infection,  and cavitary infection. 2. Multiple hypodense liver lesions, not well assessed but not obviously changed. Aortic Atherosclerosis (ICD10-I70.0). Electronically Signed   By: Marolyn JONETTA Jaksch M.D.   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  He is status post right open radical nephrectomy in June 2023 followed by 9 cycles of treatment with immunotherapy with Keytruda  last dose was given May 21, 2022 before it was discontinued secondary to disease progression.  The patient then started systemic chemotherapy with nivolumab  and Cabometyx  initially at 40 mg p.o. daily but this was reduced to 20 mg p.o. daily before it was discontinued secondary to intolerance.  His treatment with nivolumab  was discontinued after 7 cycles secondary to disease progression.  The patient was seen by Dr. Joyice at [facility] for a second opinion and she recommended for the patient treatment with lenvatinib  and everolimus  starting at a reduced dose. He is currently on treatment with lenvatinib  14 mg p.o.  daily in addition to everolimus  5 mg p.o. daily started February 02, 2023.  He has been tolerating this treatment fairly well except for occasional diarrhea.  His treatment has been on hold since July 07, 2023 secondary to suspicious pulmonary infection/inflammatory process. He had repeat CT scan of the chest performed recently.  I personally independently reviewed the scan and discussed the result with the patient and his wife.  His scan showed redemonstration of the cavitary lesions in the right upper lobe and left upper lobe with the right upper lobe lesion slightly decreased in size.  There is also multiple pulmonary nodules consistent with metastatic disease  several centimeters opdualag..  With the prior exam.  There was also slight increase in AP window lymph nodes could be reactive versus metastatic.  There is decrease in the hypodense liver lesion compared to the prior exam. The patient is currently on treatment with voriconazole  for pulmonary aspergillus. He is currently undergoing treatment with axitinib  3 mg p.o. twice daily started November 03, 2023 and tolerated it fairly well. He had repeat CT scan of the chest performed recently.  I personally independently reviewed the scan images and discussed the result with the patient today.  He continues to have stable cavitary as well as multiple other bilateral pulmonary nodules. 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 Currently on axitinib  3 mg twice daily since July 2025. Disease appears stable with no significant growth in lesions. - Continue axitinib  3 mg twice daily until current supply is exhausted. - Consult pharmacist to confirm safety of increasing axitinib  dose to 5 mg twice daily. - Switch to 5 mg axitinib  tablets after current supply is exhausted, if no contraindications.  Pulmonary aspergillosis Previously treated with voriconazole , discontinued on February 03, 2024. Current CT scan shows cavitary lesions likely related to fungal infection, with no significant growth. Some nodules may represent cancer, but majority are stable. - Monitor cavitary lesions and nodules for changes in size or appearance. - No current antifungal treatment needed unless changes are observed. I will see him back for follow-up visit in 1 months for evaluation and repeat blood work. He was advised to call immediately if he has any other concerning symptoms in the interval.   The patient voices understanding of current disease status and treatment options and is in agreement with the current care plan.  All questions were  answered. The patient knows to call the clinic with any problems, questions or concerns. We can certainly see the patient much sooner if necessary.  The total time spent in the appointment was 30 minutes.  Disclaimer: This note was dictated with voice recognition software. Similar sounding words can inadvertently be transcribed and may not be corrected upon review.

## 2024-02-24 NOTE — Telephone Encounter (Signed)
 Oral Oncology Pharmacist Encounter  Left voicemail for patient to call back to discuss medication dosing.  Notified by Dr. Sherrod that patient has discontinued voriconazole  at this time, thus plans are to increase to standard dose of Inlyta  (5 mg PO BID). Patient currently is taking 3 mg PO BID, so will discuss if patient would like to switch tablet formulation to the 5 mg tablet to decrease pill burden.   Left direct phone number for patient to call back - will try again on 02/25/24.  Asberry Macintosh, PharmD, BCPS, BCOP Hematology/Oncology Clinical Pharmacist [phone] 02/24/2024 4:52 PM

## 2024-02-25 ENCOUNTER — Other Ambulatory Visit: Payer: Self-pay

## 2024-02-25 ENCOUNTER — Other Ambulatory Visit (HOSPITAL_COMMUNITY): Payer: Self-pay

## 2024-02-25 MED ORDER — AXITINIB 5 MG PO TABS
5.0000 mg | ORAL_TABLET | Freq: Two times a day (BID) | ORAL | 3 refills | Status: DC
  Filled 2024-02-25 – 2024-02-29 (×2): qty 60, 30d supply, fill #0
  Filled 2024-03-23: qty 60, 30d supply, fill #1

## 2024-02-25 NOTE — Telephone Encounter (Signed)
 Oral Oncology Pharmacist Encounter  Spoke with Micheal Mcintyre regarding increasing his Inlyta  dose form 3 mg BID to 5 mg BID. Patient confirmed he has completed his voriconazole  treatment and has been off treatment for a few weeks. We discussed that now that he is off of the voriconazole , he no longer needs to be on a 50% dose reduction of his Inlyta .   Patient will start Inlyta  5 mg PO BID on 02/25/24 AM.  Patient has ~24 days of Inlyta  on hand at home, and would like to exhaust his 1 mg tablets before switching to the 5 mg tablets. I will send an updated prescription for the 5 mg tablets to [facility] and coordinate with the pharmacy to fill the week of 03/13/24 so that patient does not run out of medication.  Micheal Mcintyre has my contact information and knows to call me if he starts running out of the 1 mg tablets sooner than anticipated or if any issues arise with the 5 mg tablets.   Micheal Mcintyre, PharmD, BCPS, BCOP Hematology/Oncology Clinical Pharmacist [phone] 02/25/2024 9:29 AM

## 2024-02-29 ENCOUNTER — Other Ambulatory Visit: Payer: Self-pay | Admitting: Physician Assistant

## 2024-02-29 ENCOUNTER — Other Ambulatory Visit (HOSPITAL_COMMUNITY): Payer: Self-pay

## 2024-02-29 ENCOUNTER — Other Ambulatory Visit: Payer: Self-pay

## 2024-02-29 NOTE — Progress Notes (Signed)
 Specialty Pharmacy Refill Coordination Note  Micheal Mcintyre is a 83 y.o. male contacted today regarding refills of specialty medication(s)   Dose was increased to 5 mg, sending new dose at patient's request.   Patient requested Delivery   Delivery date: 03/01/24   Verified address: [address] [name], [place]   Medication will be filled on: 02/29/24

## 2024-03-07 DIAGNOSIS — N183 Chronic kidney disease, stage 3 unspecified: Secondary | ICD-10-CM | POA: Diagnosis not present

## 2024-03-14 DIAGNOSIS — C7951 Secondary malignant neoplasm of bone: Secondary | ICD-10-CM | POA: Insufficient documentation

## 2024-03-14 NOTE — Progress Notes (Unsigned)
 Subjective:     Patient ID: Micheal Mcintyre, male    DOB: 1940-08-09, 83 y.o.   MRN: [id number]  No chief complaint on file.   HPI  Discussed the use of AI scribe software for clinical note transcription with the patient, who gave verbal consent to proceed.  History of Present Illness Micheal Mcintyre is an 83 year old male with hypertension and RCC who presents for follow-up.  He experiences elevated blood pressure, which he links to his medication for RCC- Axitinib  (INLYTA ). He temporarily stopped the medication, resulting in a significant decrease in blood pressure. He monitors his blood pressure at home, aiming to keep the systolic pressure below 140 mmHg. He uses amlodipine  5 mg as needed and takes telmisartan  twice daily.  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  The antifungal was stopped a month ago. He experiences severe diarrhea with an increased dose of INLYTA , leading to a temporary cessation. He resumed the previous dose and is gradually increasing it.  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 He describes it as persistent but tolerable and uses a cane for walking.  He has low magnesium  and vitamin D  levels, previously managed with high-dose vitamin D , but he is not currently taking supplements. He takes a multivitamin inconsistently.  He manages arthritis with Tylenol  as needed, avoiding NSAIDs due to kidney concerns. Arthritis pain is influenced by weather changes and limits activities like yard work.  He has a history of leg cramps associated with a lipid-lowering medication, which he discontinued. He reports sleeping well.  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    HTN Telmisartan  (Micardis ) 80 mg daily, amlodipine  5 mg daily (when SBP >140); chlorthalidone (Hygroton) 25 mg every other day, furosemide 20 mg daily as needed  BP Readings from Last 3 Encounters:  03/15/24 (!) 156/71  02/24/24 124/72  02/03/24 118/68    Hypothyroidism Levothyroxine  50 mcg  Past medical history: Malignant neoplasm right kidney- CKD, HTN, hypothyroidism  Patient denies fever, chills, SOB, CP, palpitations, dyspnea, edema, HA, vision changes, N/V/D, abdominal pain, urinary symptoms, rash, weight changes, and recent illness or hospitalizations.   History of Present Illness              There are no preventive care reminders to display for this patient.  Past Medical History:  Diagnosis Date   Arthritis    Bilateral cataracts    GERD (gastroesophageal reflux disease)    Hypertension    Hypothyroidism    Medicare annual wellness visit, subsequent 08/19/2014   Osteoarthritis 09/21/2007   Qualifier: Diagnosis of  By: Viviann Raddle MD, Marsha R    PONV (postoperative nausea and vomiting)    Preventative health care 06/07/2016   Right shoulder pain 12/03/2016    Past Surgical History:  Procedure Laterality Date   BRONCHIAL BIOPSY  08/17/2023   Procedure: BRONCHOSCOPY, WITH BIOPSY;  Surgeon: Shelah Lamar RAMAN, MD;  Location: [facility] ENDOSCOPY;  Service: Pulmonary;;   BRONCHIAL BRUSHINGS  08/17/2023   Procedure: BRONCHOSCOPY, WITH BRUSH BIOPSY;  Surgeon: Shelah Lamar RAMAN, MD;  Location: [facility] ENDOSCOPY;  Service: Pulmonary;;   BRONCHIAL NEEDLE  ASPIRATION BIOPSY  08/17/2023   Procedure: BRONCHOSCOPY, WITH NEEDLE ASPIRATION BIOPSY;  Surgeon: Shelah Lamar RAMAN, MD;  Location: [facility] ENDOSCOPY;  Service: Pulmonary;;   COLONOSCOPY     HIP SURGERY Left    1991   JOINT REPLACEMENT Right 2005   hip   NEPHRECTOMY Right 10/15/2021   Procedure: NEPHRECTOMY, OPEN;  Surgeon:  Selma Donnice SAUNDERS, MD;  Location: [facility] ORS;  Service: Urology;  Laterality: Right;   nose skin cancer removal      Family History  Problem Relation Age of Onset   Heart attack Father 38       deceased   Diabetes Maternal Uncle        maternal grandfather   Hypertension Neg Hx    Breast cancer Neg Hx    Colon cancer Neg Hx    Prostate cancer Neg Hx     Social History   Socioeconomic History   Marital status: Married    Spouse name: Not on file   Number of children: Not on file   Years of education: Not on file   Highest education level: Not on file  Occupational History   Not on file  Tobacco Use   Smoking status: Never    Passive exposure: Never   Smokeless tobacco: Never  Vaping Use   Vaping status: Never Used  Substance and Sexual Activity   Alcohol use: Not Currently    Comment: 5 beers a year   Drug use: No   Sexual activity: Not Currently  Other Topics Concern   Not on file  Social History Narrative   Not on file   Social Drivers of Health   Financial Resource Strain: Low Risk  (01/13/2024)   Overall Financial Resource Strain (CARDIA)    Difficulty of Paying Living Expenses: Not very hard  Food Insecurity: No Food Insecurity (01/13/2024)   Hunger Vital Sign    Worried About Running Out of Food in the Last Year: Never true    Ran Out of Food in the Last Year: Never true  Transportation Needs: No Transportation Needs (01/13/2024)   PRAPARE - Administrator, Civil Service (Medical): No    Lack of Transportation (Non-Medical): No  Physical Activity: Sufficiently Active (01/13/2024)   Exercise Vital Sign    Days of Exercise per Week: 5 days    Minutes of Exercise per Session: 30 min  Stress: No Stress Concern Present (01/13/2024)   [employer] of Occupational Health - Occupational Stress Questionnaire    Feeling of Stress: Not at all  Social Connections: Moderately Integrated (01/13/2024)   Social Connection and Isolation Panel    Frequency of  Communication with Friends and Family: More than three times a week    Frequency of Social Gatherings with Friends and Family: More than three times a week    Attends Religious Services: More than 4 times per year    Active Member of [employer] or Organizations: No    Attends Banker Meetings: Never    Marital Status: Married  Catering Manager Violence: Not At Risk (01/13/2024)   Humiliation, Afraid, Rape, and Kick questionnaire    Fear of Current or Ex-Partner: No    Emotionally Abused: No    Physically Abused: No    Sexually Abused: No    Outpatient Medications Prior to Visit  Medication Sig Dispense Refill   acetaminophen  (TYLENOL ) 500 MG tablet Take 1,000 mg by mouth every 6 (six) hours as needed for moderate pain.  amLODipine  (NORVASC ) 5 MG tablet Take 5 mg by mouth daily. Take only when systolic is greater than 140     axitinib  (INLYTA ) 5 MG tablet Take 1 tablet (5 mg total) by mouth 2 (two) times daily. 60 tablet 3   chlorthalidone (HYGROTON) 25 MG tablet Take 25 mg by mouth every other day.     furosemide (LASIX) 20 MG tablet Take 20 mg by mouth daily as needed.     levothyroxine  (SYNTHROID ) 50 MCG tablet TAKE 1 TABLET(50 MCG) BY MOUTH DAILY BEFORE BREAKFAST 30 tablet 1   levothyroxine  (SYNTHROID ) 50 MCG tablet TAKE 1 TABLET(50 MCG) BY MOUTH DAILY BEFORE BREAKFAST 90 tablet 0   Multiple Vitamin (MULTIVITAMIN WITH MINERALS) TABS tablet Take 1 tablet by mouth 4 (four) times a week.     telmisartan  (MICARDIS ) 80 MG tablet TAKE 1/2 TABLET BY MOUTH TWICE DAILY 180 tablet 1   No facility-administered medications prior to visit.    Allergies  Allergen Reactions   Statins Other (See Comments)    myalgia    ROS    See HPI Objective:    Physical Exam Vitals reviewed.  Constitutional:      General: He is not in acute distress.    Appearance: He is not toxic-appearing.  HENT:     Head: Normocephalic and atraumatic.     Mouth/Throat:     Mouth: Mucous membranes  are moist.     Pharynx: Oropharynx is clear.  Eyes:     Extraocular Movements: Extraocular movements intact.     Pupils: Pupils are equal, round, and reactive to light.  Cardiovascular:     Rate and Rhythm: Normal rate and regular rhythm.     Pulses: Normal pulses.     Heart sounds: Normal heart sounds. No murmur heard. Pulmonary:     Effort: Pulmonary effort is normal. No respiratory distress.     Breath sounds: Normal breath sounds. No wheezing.  Musculoskeletal:        General: No swelling.     Cervical back: Neck supple.  Skin:    General: Skin is warm and dry.  Neurological:     General: No focal deficit present.     Mental Status: He is alert and oriented to person, place, and time.  Psychiatric:        Mood and Affect: Mood normal.        Behavior: Behavior normal.        Thought Content: Thought content normal.        Judgment: Judgment normal.      BP (!) 156/71   Pulse 67   Ht 5' 10 (1.778 m)   Wt 129 lb (58.5 kg)   SpO2 99%   BMI 18.51 kg/m  Wt Readings from Last 3 Encounters:  03/15/24 129 lb (58.5 kg)  02/24/24 126 lb (57.2 kg)  02/03/24 122 lb 12.8 oz (55.7 kg)       Assessment & Plan:   Problem List Items Addressed This Visit     Cancer of kidney (HCC) - Primary   Metastatic renal cell carcinoma, managed with INLYTA .   Conitnue follow up with Oncology.      Cancer, metastatic to bone (HCC)   CRI (chronic renal insufficiency)   Hydrate and monitor         Essential hypertension   Monitor BP at home. If BP >140/90. Take amlodipine  5 mg daily if BP exceeds 140/90 mmHg. - Continue telmisartan  as prescribed.Encouraged heart healthy diet such as  the DASH diet and exercise as tolerated.         Hyperglycemia   hgba1c acceptable, minimize simple carbs. Increase exercise as tolerated.         Relevant Orders   HgB A1c   Hyperlipidemia, mixed   Encourage heart healthy diet such as MIND or DASH diet, increase exercise, avoid trans fats,  simple carbohydrates and processed foods, consider a krill or fish or flaxseed oil cap daily.        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  Pt declines medications at this time. - Check B12 levels. - Consider gabapentin or Cymbalta if symptoms worsen       Relevant Orders   Vitamin B12   Osteoarthritis   Chronic pain managed with Tylenol . Avoids NSAIDs due to kidney concerns. - Continue Tylenol  500 mg as needed for pain. -Encouraged increased activity, topical treatments such as Salon Pas       Vitamin D  deficiency   Supplement and monitor         Relevant Orders   Vitamin D  (25 hydroxy)   Other Visit Diagnoses       Hypomagnesemia       Relevant Orders   Magnesium       Hypomagnesemia Previous low magnesium  levels noted. Re-evaluation needed. - Rechecked magnesium  levels.  Mixed hyperlipidemia Previously on lipid-lowering medication, discontinued due to side effects- muscle cramping in legs. - Rechecked lipid levels.  General Health Maintenance Discussed importance of regular health maintenance and monitoring of chronic conditions. - Scheduled follow-up in 6 months. - Encouraged regular lab work and monitoring of chronic conditions.  Nutritional Status -Encourage high protein diet with meat, beans, nuts, seeds, dairy, and eggs. -Check magnesium  and vitamin D  today    I am having Ordean C. Rought maintain his multivitamin with minerals, acetaminophen , chlorthalidone, furosemide, levothyroxine , telmisartan , amLODipine , axitinib , and levothyroxine .  No orders of the defined types were placed in this encounter.

## 2024-03-15 ENCOUNTER — Encounter: Payer: Self-pay | Admitting: Student

## 2024-03-15 ENCOUNTER — Ambulatory Visit: Admitting: Student

## 2024-03-15 VITALS — BP 156/71 | HR 67 | Ht 70.0 in | Wt 129.0 lb

## 2024-03-15 DIAGNOSIS — R739 Hyperglycemia, unspecified: Secondary | ICD-10-CM

## 2024-03-15 DIAGNOSIS — I10 Essential (primary) hypertension: Secondary | ICD-10-CM

## 2024-03-15 DIAGNOSIS — N289 Disorder of kidney and ureter, unspecified: Secondary | ICD-10-CM

## 2024-03-15 DIAGNOSIS — C649 Malignant neoplasm of unspecified kidney, except renal pelvis: Secondary | ICD-10-CM

## 2024-03-15 DIAGNOSIS — E782 Mixed hyperlipidemia: Secondary | ICD-10-CM | POA: Diagnosis not present

## 2024-03-15 DIAGNOSIS — R200 Anesthesia of skin: Secondary | ICD-10-CM | POA: Diagnosis not present

## 2024-03-15 DIAGNOSIS — C641 Malignant neoplasm of right kidney, except renal pelvis: Secondary | ICD-10-CM

## 2024-03-15 DIAGNOSIS — N2581 Secondary hyperparathyroidism of renal origin: Secondary | ICD-10-CM | POA: Diagnosis not present

## 2024-03-15 DIAGNOSIS — M1990 Unspecified osteoarthritis, unspecified site: Secondary | ICD-10-CM

## 2024-03-15 DIAGNOSIS — I129 Hypertensive chronic kidney disease with stage 1 through stage 4 chronic kidney disease, or unspecified chronic kidney disease: Secondary | ICD-10-CM | POA: Diagnosis not present

## 2024-03-15 DIAGNOSIS — E559 Vitamin D deficiency, unspecified: Secondary | ICD-10-CM | POA: Diagnosis not present

## 2024-03-15 DIAGNOSIS — C7951 Secondary malignant neoplasm of bone: Secondary | ICD-10-CM

## 2024-03-15 DIAGNOSIS — E039 Hypothyroidism, unspecified: Secondary | ICD-10-CM

## 2024-03-15 DIAGNOSIS — D631 Anemia in chronic kidney disease: Secondary | ICD-10-CM | POA: Diagnosis not present

## 2024-03-15 DIAGNOSIS — N1831 Chronic kidney disease, stage 3a: Secondary | ICD-10-CM | POA: Diagnosis not present

## 2024-03-15 LAB — VITAMIN B12: Vitamin B-12: 154 pg/mL — ABNORMAL LOW (ref 211–911)

## 2024-03-15 LAB — MAGNESIUM: Magnesium: 2 mg/dL (ref 1.5–2.5)

## 2024-03-15 LAB — HEMOGLOBIN A1C: Hgb A1c MFr Bld: 6 % (ref 4.6–6.5)

## 2024-03-15 LAB — LIPID PANEL
Cholesterol: 197 mg/dL (ref 0–200)
HDL: 77.5 mg/dL (ref >39.00)
LDL Cholesterol: 101 mg/dL — ABNORMAL HIGH (ref 0–99)
NonHDL: 119.78
Total CHOL/HDL Ratio: 3
Triglycerides: 95 mg/dL (ref 0.0–149.0)
VLDL: 19 mg/dL (ref 0.0–40.0)

## 2024-03-15 LAB — VITAMIN D 25 HYDROXY (VIT D DEFICIENCY, FRACTURES): VITD: 32.53 ng/mL (ref 30.00–100.00)

## 2024-03-15 NOTE — Assessment & Plan Note (Addendum)
 Metastatic renal cell carcinoma, managed with INLYTA .   Conitnue follow up with Oncology.

## 2024-03-15 NOTE — Assessment & Plan Note (Signed)
 Chronic pain managed with Tylenol . Avoids NSAIDs due to kidney concerns. - Continue Tylenol  500 mg as needed for pain. -Encouraged increased activity, topical treatments such as Salon Pas

## 2024-03-15 NOTE — Assessment & Plan Note (Signed)
 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  Pt declines medications at this time. - Check B12 levels. - Consider gabapentin or Cymbalta if symptoms worsen

## 2024-03-15 NOTE — Assessment & Plan Note (Signed)
 Hydrate and monitor

## 2024-03-15 NOTE — Assessment & Plan Note (Addendum)
 Monitor BP at home. If BP >140/90. Take amlodipine  5 mg daily if BP exceeds 140/90 mmHg. - Continue telmisartan  as prescribed.Encouraged heart healthy diet such as the DASH diet and exercise as tolerated.

## 2024-03-15 NOTE — Assessment & Plan Note (Signed)
 Supplement and monitor

## 2024-03-15 NOTE — Assessment & Plan Note (Signed)
 On Levothyroxine, continue to monitor

## 2024-03-15 NOTE — Assessment & Plan Note (Signed)
 hgba1c acceptable, minimize simple carbs. Increase exercise as tolerated.

## 2024-03-15 NOTE — Assessment & Plan Note (Signed)
 Encourage heart healthy diet such as MIND or DASH diet, increase exercise, avoid trans fats, simple carbohydrates and processed foods, consider a krill or fish or flaxseed oil cap daily.

## 2024-03-16 ENCOUNTER — Ambulatory Visit: Payer: Self-pay | Admitting: Student

## 2024-03-16 DIAGNOSIS — R350 Frequency of micturition: Secondary | ICD-10-CM

## 2024-03-16 DIAGNOSIS — E559 Vitamin D deficiency, unspecified: Secondary | ICD-10-CM

## 2024-03-16 DIAGNOSIS — E538 Deficiency of other specified B group vitamins: Secondary | ICD-10-CM

## 2024-03-16 MED ORDER — VITAMIN B-12 1000 MCG PO TABS: 1000.0000 ug | ORAL_TABLET | Freq: Every day | ORAL | Status: AC

## 2024-03-16 MED ORDER — VITAMIN D3 25 MCG (1000 UT) PO CAPS: 1000.0000 [IU] | ORAL_CAPSULE | Freq: Every day | ORAL | Status: AC

## 2024-03-23 ENCOUNTER — Other Ambulatory Visit (HOSPITAL_COMMUNITY): Payer: Self-pay

## 2024-03-28 ENCOUNTER — Other Ambulatory Visit (HOSPITAL_COMMUNITY): Payer: Self-pay

## 2024-04-03 ENCOUNTER — Other Ambulatory Visit: Payer: Self-pay

## 2024-04-03 ENCOUNTER — Other Ambulatory Visit: Payer: Self-pay | Admitting: Internal Medicine

## 2024-04-03 ENCOUNTER — Encounter: Payer: Self-pay | Admitting: Internal Medicine

## 2024-04-03 DIAGNOSIS — C641 Malignant neoplasm of right kidney, except renal pelvis: Secondary | ICD-10-CM

## 2024-04-03 MED ORDER — AXITINIB 1 MG PO TABS: 6.0000 mg | ORAL_TABLET | Freq: Two times a day (BID) | ORAL | 2 refills | Status: DC

## 2024-04-03 MED ORDER — AXITINIB 1 MG PO TABS
6.0000 mg | ORAL_TABLET | Freq: Two times a day (BID) | ORAL | 2 refills | Status: DC
  Filled 2024-04-04 – 2024-04-13 (×2): qty 180, 15d supply, fill #0

## 2024-04-04 ENCOUNTER — Other Ambulatory Visit (HOSPITAL_COMMUNITY): Payer: Self-pay

## 2024-04-04 ENCOUNTER — Other Ambulatory Visit: Payer: Self-pay

## 2024-04-07 ENCOUNTER — Telehealth: Payer: Self-pay | Admitting: Medical Oncology

## 2024-04-07 NOTE — Telephone Encounter (Signed)
 Diarrhea and Inlyta  dose. He stopped Inlyta  last Saturday due to 10+ episodes of  diarrhea.  Imodium helped and his last episode was . Sunday. He stated he feels fine now.  He asked about reducing Inlyta  dose.   He said he did take 5 mg today and asked if he should take it again tonight.  I told pt that per Cassie she said to take the 5 mg bid and if diarrhea recurs to stop drug, rehydrate and  will discuss with Mohamed at next appt.   Pt voiced understanding.

## 2024-04-12 ENCOUNTER — Inpatient Hospital Stay: Attending: Oncology

## 2024-04-12 ENCOUNTER — Inpatient Hospital Stay: Admitting: Internal Medicine

## 2024-04-12 VITALS — BP 126/71 | HR 72 | Temp 97.6°F | Resp 17 | Ht 70.0 in | Wt 129.0 lb

## 2024-04-12 DIAGNOSIS — I10 Essential (primary) hypertension: Secondary | ICD-10-CM | POA: Diagnosis not present

## 2024-04-12 DIAGNOSIS — Z905 Acquired absence of kidney: Secondary | ICD-10-CM | POA: Insufficient documentation

## 2024-04-12 DIAGNOSIS — E860 Dehydration: Secondary | ICD-10-CM | POA: Insufficient documentation

## 2024-04-12 DIAGNOSIS — C787 Secondary malignant neoplasm of liver and intrahepatic bile duct: Secondary | ICD-10-CM | POA: Diagnosis not present

## 2024-04-12 DIAGNOSIS — N179 Acute kidney failure, unspecified: Secondary | ICD-10-CM | POA: Diagnosis not present

## 2024-04-12 DIAGNOSIS — C641 Malignant neoplasm of right kidney, except renal pelvis: Secondary | ICD-10-CM | POA: Diagnosis present

## 2024-04-12 DIAGNOSIS — K521 Toxic gastroenteritis and colitis: Secondary | ICD-10-CM | POA: Insufficient documentation

## 2024-04-12 DIAGNOSIS — C7951 Secondary malignant neoplasm of bone: Secondary | ICD-10-CM | POA: Diagnosis not present

## 2024-04-12 DIAGNOSIS — C649 Malignant neoplasm of unspecified kidney, except renal pelvis: Secondary | ICD-10-CM

## 2024-04-12 DIAGNOSIS — T451X5A Adverse effect of antineoplastic and immunosuppressive drugs, initial encounter: Secondary | ICD-10-CM | POA: Insufficient documentation

## 2024-04-12 LAB — CBC WITH DIFFERENTIAL (CANCER CENTER ONLY)
Abs Immature Granulocytes: 0.01 K/uL (ref 0.00–0.07)
Basophils Absolute: 0.1 K/uL (ref 0.0–0.1)
Basophils Relative: 1 %
Eosinophils Absolute: 0.2 K/uL (ref 0.0–0.5)
Eosinophils Relative: 2 %
HCT: 43.8 % (ref 39.0–52.0)
Hemoglobin: 14.8 g/dL (ref 13.0–17.0)
Immature Granulocytes: 0 %
Lymphocytes Relative: 16 %
Lymphs Abs: 1.1 K/uL (ref 0.7–4.0)
MCH: 32.4 pg (ref 26.0–34.0)
MCHC: 33.8 g/dL (ref 30.0–36.0)
MCV: 95.8 fL (ref 80.0–100.0)
Monocytes Absolute: 0.7 K/uL (ref 0.1–1.0)
Monocytes Relative: 11 %
Neutro Abs: 4.8 K/uL (ref 1.7–7.7)
Neutrophils Relative %: 70 %
Platelet Count: 191 K/uL (ref 150–400)
RBC: 4.57 MIL/uL (ref 4.22–5.81)
RDW: 12.6 % (ref 11.5–15.5)
WBC Count: 6.8 K/uL (ref 4.0–10.5)
nRBC: 0 % (ref 0.0–0.2)

## 2024-04-12 LAB — CMP (CANCER CENTER ONLY)
ALT: 28 U/L (ref 0–44)
AST: 31 U/L (ref 15–41)
Albumin: 4.3 g/dL (ref 3.5–5.0)
Alkaline Phosphatase: 137 U/L — ABNORMAL HIGH (ref 38–126)
Anion gap: 9 (ref 5–15)
BUN: 31 mg/dL — ABNORMAL HIGH (ref 8–23)
CO2: 28 mmol/L (ref 22–32)
Calcium: 9.7 mg/dL (ref 8.9–10.3)
Chloride: 102 mmol/L (ref 98–111)
Creatinine: 1.27 mg/dL — ABNORMAL HIGH (ref 0.61–1.24)
GFR, Estimated: 57 mL/min — ABNORMAL LOW (ref >60)
Glucose, Bld: 87 mg/dL (ref 70–99)
Potassium: 4.9 mmol/L (ref 3.5–5.1)
Sodium: 139 mmol/L (ref 135–145)
Total Bilirubin: 0.3 mg/dL (ref 0.0–1.2)
Total Protein: 7.4 g/dL (ref 6.5–8.1)

## 2024-04-12 NOTE — Progress Notes (Signed)
 [facility] [phone]   [phone]  OFFICE PROGRESS NOTE  [name], MD 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    PRIOR THERAPY: 1) Status post right open radical nephrectomy completed on October 15, 2021 by Dr. Donnice Siad.  2) Keytruda  200 Mg IV every 3 weeks started December 03, 2021.  Status post 9 cycles.  Last dose was given May 21, 2022.  This was discontinued secondary to disease progression. 3) Systemic treatment with nivolumab  480 Mg IV every 4 weeks in addition to Cabometyx  40 mg p.o. daily.  First dose June 18, 2022.  Status post 7 cycles.  His Cabometyx  has been on hold recently due to adverse side effects. His dose was reduced in May to 20 mg p.o. daily but he continues to have weakness, diarrhea, and dehydration. This has been on hold since 5/19.  Last dose was given December 02, 2022 discontinued secondary to disease progression. 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   CURRENT THERAPY: Axitinib  3 mg p.o. twice daily.  First dose started 11/03/2023.  INTERVAL HISTORY: KENON DELASHMIT 83 y.o. male returns to the clinic today for follow-up visit accompanied by his wife. Discussed the use of AI scribe software for clinical note transcription with the patient, who gave verbal consent to proceed.  History of Present Illness JEFFREN DOMBEK is an 83 year old male with cancer who presents for evaluation and repeat blood work. He is accompanied by his wife.  He is currently on axitinib  since July 2025. Previously, he was on lenvatinib , which was  discontinued due to toxicity while he was receiving treatment for pulmonary aspergillosis.  A couple of weeks ago, after his axitinib  dose was increased to 5 mg twice a day, he experienced severe diarrhea, going to the toilet twelve times at night, and became lightheaded. In response, he stopped the medication for a couple of days and then resumed it at the same dose after consulting with the pharmacy about adjusting the dose with 1 mg tablets.  His blood pressure sometimes spikes, with a recent reading of 160 systolic.    MEDICAL HISTORY: Past Medical History:  Diagnosis Date   Arthritis    Bilateral cataracts    GERD (gastroesophageal reflux disease)    Hypertension    Hypothyroidism    Medicare annual wellness visit, subsequent 08/19/2014   Osteoarthritis 09/21/2007   Qualifier: Diagnosis of  By: Viviann Raddle MD, Marsha R    PONV (postoperative nausea and vomiting)    Preventative health care 06/07/2016   Right shoulder pain 12/03/2016    ALLERGIES:  is allergic to statins.  MEDICATIONS:  Current Outpatient Medications  Medication Sig Dispense Refill   acetaminophen  (TYLENOL ) 500 MG tablet Take 1,000 mg by mouth every 6 (six) hours as needed for moderate pain.     amLODipine  (NORVASC ) 5 MG tablet Take 5 mg by mouth daily. Take only when systolic is greater than 140     axitinib  (INLYTA ) 1 MG tablet Take 6 tablets (6 mg total) by mouth 2 (two) times  daily. 180 tablet 2   chlorthalidone (HYGROTON) 25 MG tablet Take 25 mg by mouth every other day.     Cholecalciferol (VITAMIN D3) 25 MCG (1000 UT) CAPS Take 1 capsule (1,000 Units total) by mouth daily.     cyanocobalamin  (VITAMIN B12) 1000 MCG tablet Take 1 tablet (1,000 mcg total) by mouth daily.     furosemide (LASIX) 20 MG tablet Take 20 mg by mouth daily as needed.     levothyroxine  (SYNTHROID ) 50 MCG tablet TAKE 1 TABLET(50 MCG) BY MOUTH DAILY BEFORE BREAKFAST 30 tablet 1   levothyroxine  (SYNTHROID ) 50 MCG tablet TAKE 1  TABLET(50 MCG) BY MOUTH DAILY BEFORE BREAKFAST 90 tablet 0   Multiple Vitamin (MULTIVITAMIN WITH MINERALS) TABS tablet Take 1 tablet by mouth 4 (four) times a week.     telmisartan  (MICARDIS ) 80 MG tablet TAKE 1/2 TABLET BY MOUTH TWICE DAILY 180 tablet 1   No current facility-administered medications for this visit.    SURGICAL HISTORY:  Past Surgical History:  Procedure Laterality Date   BRONCHIAL BIOPSY  08/17/2023   Procedure: BRONCHOSCOPY, WITH BIOPSY;  Surgeon: Shelah Lamar RAMAN, MD;  Location[facility] ENDOSCOPY;  Service: Pulmonary;;   BRONCHIAL BRUSHINGS  08/17/2023   Procedure: BRONCHOSCOPY, WITH BRUSH BIOPSY;  Surgeon: Shelah Lamar RAMAN, MD;  Location: [facility] ENDOSCOPY;  Service: Pulmonary;;   BRONCHIAL NEEDLE ASPIRATION BIOPSY  08/17/2023   Procedure: BRONCHOSCOPY, WITH NEEDLE ASPIRATION BIOPSY;  Surgeon: Shelah Lamar RAMAN, MD;  Location: [facility] ENDOSCOPY;  Service: Pulmonary;;   COLONOSCOPY     HIP SURGERY Left    1991   JOINT REPLACEMENT Right 2005   hip   NEPHRECTOMY Right 10/15/2021   Procedure: NEPHRECTOMY, OPEN;  Surgeon: Selma Donnice SAUNDERS, MD;  Location: [facility] ORS;  Service: Urology;  Laterality: Right;   nose skin cancer removal      REVIEW OF SYSTEMS:  A comprehensive review of systems was negative except for: Constitutional: positive for fatigue Gastrointestinal: positive for diarrhea   PHYSICAL EXAMINATION: General appearance: alert, cooperative, fatigued, and no distress Head: Normocephalic, without obvious abnormality, atraumatic Neck: no adenopathy, no JVD, supple, symmetrical, trachea midline, and thyroid  not enlarged, symmetric, no tenderness/mass/nodules Lymph nodes: Cervical, supraclavicular, and axillary nodes normal. Resp: clear to auscultation bilaterally Back: symmetric, no curvature. ROM normal. No CVA tenderness. Cardio: regular rate and rhythm, S1, S2 normal, no murmur, click, rub or gallop GI: soft, non-tender; bowel sounds normal; no masses,  no organomegaly Extremities:  extremities normal, atraumatic, no cyanosis or edema  ECOG PERFORMANCE STATUS: 1 - Symptomatic but completely ambulatory  Blood pressure 126/71, pulse 72, temperature 97.6 F (36.4 C), temperature source Temporal, resp. rate 17, height 5' 10 (1.778 m), weight 129 lb (58.5 kg), SpO2 100%.  LABORATORY DATA: Lab Results  Component Value Date   WBC 6.8 04/12/2024   HGB 14.8 04/12/2024   HCT 43.8 04/12/2024   MCV 95.8 04/12/2024   PLT 191 04/12/2024      Chemistry      Component Value Date/Time   NA 139 04/12/2024 0919   NA 140 04/22/2022 0000   K 4.9 04/12/2024 0919   CL 102 04/12/2024 0919   CO2 28 04/12/2024 0919   BUN 31 (H) 04/12/2024 0919   BUN 21 04/22/2022 0000   CREATININE 1.27 (H) 04/12/2024 0919   CREATININE 1.50 (H) 11/25/2023 1124   GLU 88 04/22/2022 0000      Component Value Date/Time   CALCIUM  9.7 04/12/2024 0919   ALKPHOS 137 (H) 04/12/2024 0919  AST 31 04/12/2024 0919   ALT 28 04/12/2024 0919   BILITOT 0.3 04/12/2024 0919       RADIOGRAPHIC STUDIES: No results found.     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  He is status post right open radical nephrectomy in June 2023 followed by 9 cycles of treatment with immunotherapy with Keytruda  last dose was given May 21, 2022 before it was discontinued secondary to disease progression.  The patient then started systemic chemotherapy with nivolumab  and Cabometyx  initially at 40 mg p.o. daily but this was reduced to 20 mg p.o. daily before it was discontinued secondary to intolerance.  His treatment with nivolumab  was discontinued after 7 cycles secondary to disease progression.  The patient was seen by Dr. Joyice at [facility] center for a second opinion and she  recommended for the patient treatment with lenvatinib  and everolimus  starting at a reduced dose. He is currently on treatment with lenvatinib  14 mg p.o. daily in addition to everolimus  5 mg p.o. daily started February 02, 2023.  He has been tolerating this treatment fairly well except for occasional diarrhea.  His treatment has been on hold since July 07, 2023 secondary to suspicious pulmonary infection/inflammatory process. He had repeat CT scan of the chest performed recently.  I personally independently reviewed the scan and discussed the result with the patient and his wife.  His scan showed redemonstration of the cavitary lesions in the right upper lobe and left upper lobe with the right upper lobe lesion slightly decreased in size.  There is also multiple pulmonary nodules consistent with metastatic disease several centimeters opdualag..  With the prior exam.  There was also slight increase in AP window lymph nodes could be reactive versus metastatic.  There is decrease in the hypodense liver lesion compared to the prior exam. The patient is currently on treatment with voriconazole  for pulmonary aspergillus. He is currently undergoing treatment with axitinib  3 mg p.o. twice daily started November 03, 2023 and tolerated it fairly well. Assessment and Plan Assessment & Plan Metastatic hepatocellular carcinoma with bone metastases Currently on axitinib  since July 2025. Experienced significant diarrhea and lightheadedness after increasing axitinib  to 5 mg twice daily. Blood work today is stable. - Continue axitinib  with dose adjustment as needed using 1 mg tablets for titration. - Will order scan at next visit.  Hypertension Blood pressure today is well-controlled at 126/71 mmHg. Reports occasional spikes in blood pressure, possibly related to cancer medication. Nephrologist advised taking an additional antihypertensive pill if blood pressure rises. - Monitor blood pressure twice daily at home. He was  advised to call immediately if he has any other concerning symptoms in the interval. The patient voices understanding of current disease status and treatment options and is in agreement with the current care plan.  All questions were answered. The patient knows to call the clinic with any problems, questions or concerns. We can certainly see the patient much sooner if necessary.  The total time spent in the appointment was 20 minutes.  Disclaimer: This note was dictated with voice recognition software. Similar sounding words can inadvertently be transcribed and may not be corrected upon review.

## 2024-04-13 ENCOUNTER — Other Ambulatory Visit: Payer: Self-pay

## 2024-04-13 ENCOUNTER — Telehealth: Payer: Self-pay | Admitting: Pharmacist

## 2024-04-13 ENCOUNTER — Other Ambulatory Visit (HOSPITAL_COMMUNITY): Payer: Self-pay

## 2024-04-13 NOTE — Telephone Encounter (Signed)
 Oral Oncology Pharmacist Encounter  Confirmed with Dr. Sherrod that patient will remain on Inlyta  5 mg BID for now - if this is not tolerable, prescription for Inlyta  3 mg BID will be sent in at that time (patient's insurance will only cover up to 6 tablets of Inlyta  1 mg a day).  Patient aware of current plan - he reports currently having had 3 episodes of diarrhea today (has used Imodium which has helped for now), and will call the office tomorrow if he continues to have issues despite imodium use.   Asberry Macintosh, PharmD, BCPS, BCOP Hematology/Oncology Clinical Pharmacist [phone] 04/13/2024 3:27 PM

## 2024-04-14 ENCOUNTER — Other Ambulatory Visit (HOSPITAL_COMMUNITY): Payer: Self-pay

## 2024-04-14 ENCOUNTER — Other Ambulatory Visit: Payer: Self-pay | Admitting: Internal Medicine

## 2024-04-14 ENCOUNTER — Other Ambulatory Visit: Payer: Self-pay

## 2024-04-14 ENCOUNTER — Inpatient Hospital Stay

## 2024-04-14 ENCOUNTER — Inpatient Hospital Stay: Admitting: Physician Assistant

## 2024-04-14 ENCOUNTER — Other Ambulatory Visit: Payer: Self-pay | Admitting: Physician Assistant

## 2024-04-14 VITALS — BP 116/64 | HR 68 | Temp 97.6°F | Resp 16 | Ht 70.0 in | Wt 128.5 lb

## 2024-04-14 DIAGNOSIS — C7951 Secondary malignant neoplasm of bone: Secondary | ICD-10-CM

## 2024-04-14 DIAGNOSIS — C641 Malignant neoplasm of right kidney, except renal pelvis: Secondary | ICD-10-CM

## 2024-04-14 DIAGNOSIS — R197 Diarrhea, unspecified: Secondary | ICD-10-CM | POA: Diagnosis not present

## 2024-04-14 LAB — MAGNESIUM: Magnesium: 2.1 mg/dL (ref 1.7–2.4)

## 2024-04-14 LAB — CMP (CANCER CENTER ONLY)
ALT: 27 U/L (ref 0–44)
AST: 29 U/L (ref 15–41)
Albumin: 3.8 g/dL (ref 3.5–5.0)
Alkaline Phosphatase: 98 U/L (ref 38–126)
Anion gap: 13 (ref 5–15)
BUN: 32 mg/dL — ABNORMAL HIGH (ref 8–23)
CO2: 21 mmol/L — ABNORMAL LOW (ref 22–32)
Calcium: 9.1 mg/dL (ref 8.9–10.3)
Chloride: 104 mmol/L (ref 98–111)
Creatinine: 1.63 mg/dL — ABNORMAL HIGH (ref 0.61–1.24)
GFR, Estimated: 42 mL/min — ABNORMAL LOW (ref >60)
Glucose, Bld: 137 mg/dL — ABNORMAL HIGH (ref 70–99)
Potassium: 4 mmol/L (ref 3.5–5.1)
Sodium: 138 mmol/L (ref 135–145)
Total Bilirubin: 0.5 mg/dL (ref 0.0–1.2)
Total Protein: 6.5 g/dL (ref 6.5–8.1)

## 2024-04-14 LAB — CBC WITH DIFFERENTIAL (CANCER CENTER ONLY)
Abs Immature Granulocytes: 0.02 K/uL (ref 0.00–0.07)
Basophils Absolute: 0 K/uL (ref 0.0–0.1)
Basophils Relative: 0 %
Eosinophils Absolute: 0 K/uL (ref 0.0–0.5)
Eosinophils Relative: 0 %
HCT: 42.6 % (ref 39.0–52.0)
Hemoglobin: 14.1 g/dL (ref 13.0–17.0)
Immature Granulocytes: 0 %
Lymphocytes Relative: 8 %
Lymphs Abs: 0.7 K/uL (ref 0.7–4.0)
MCH: 32.1 pg (ref 26.0–34.0)
MCHC: 33.1 g/dL (ref 30.0–36.0)
MCV: 97 fL (ref 80.0–100.0)
Monocytes Absolute: 0.6 K/uL (ref 0.1–1.0)
Monocytes Relative: 7 %
Neutro Abs: 7.9 K/uL — ABNORMAL HIGH (ref 1.7–7.7)
Neutrophils Relative %: 85 %
Platelet Count: 181 K/uL (ref 150–400)
RBC: 4.39 MIL/uL (ref 4.22–5.81)
RDW: 13 % (ref 11.5–15.5)
WBC Count: 9.3 K/uL (ref 4.0–10.5)
nRBC: 0 % (ref 0.0–0.2)

## 2024-04-14 MED ORDER — AXITINIB 1 MG PO TABS
3.0000 mg | ORAL_TABLET | Freq: Two times a day (BID) | ORAL | 2 refills | Status: AC
  Filled 2024-04-14 – 2024-04-17 (×2): qty 180, 30d supply, fill #0
  Filled 2024-05-12: qty 180, 30d supply, fill #1
  Filled 2024-05-22 – 2024-06-09 (×2): qty 180, 30d supply, fill #2

## 2024-04-14 NOTE — Progress Notes (Signed)
 Symptom Management Consult Note [facility]    Patient Care Team: [name], MD as PCP - General (Family Medicine) Marcey Elspeth PARAS, MD as Consulting Physician (Ophthalmology)    Name / MRN / DOBGreer Mcintyre  [id number]  [birth year]   Date of visit: 04/14/2024   Chief Complaint/Reason for visit: diarrhea   Current Therapy: Axitinib  3 mg p.o. twice daily since July 2025    ASSESSMENT AND PLAN Patient is a 83 y.o. male with oncologic history of metastatic renal cell carcinoma followed by Dr. Sherrod.  I have viewed most recent oncology note and lab work.  #Metastatic renal cell carcinoma  - Next appointment with oncologist is 05/11/24   #Chemotherapy-induced diarrhea - Recurrent severe diarrhea linked to higher axitinib  doses, causing dehydration and AKI. Partially controlled with loperamide and holding axitinib . No GI bleeding or alarming features. - Hold axitinib  for the weekend to aid symptom resolution and renal recovery. - Advised loperamide as needed for diarrhea control. - Oncologist is not in the office today however will reach out to update him on patient's symptoms and for recommendations of what dose to resume. - Instructed to call clinic if symptoms worsen, do not improve, or if unable to tolerate oral intake.   #Acute kidney injury due to dehydration - AKI likely from volume depletion due to diarrhea, with creatinine rise. Electrolytes and hemoglobin stable. Tolerating oral fluids without persistent vomiting. - Monitored lab values; stable except for mild AKI. - Deferred IV fluids as clinically improving and tolerating oral intake.Advised increased oral fluid intake for renal recovery. - Instructed to monitor for worsening dehydration signs and seek care if he occurs. - Planned to reassess renal function and symptoms at next follow-up or sooner if symptoms worsen.  Strict ED precautions discussed should symptoms worsen.   HEME/ONC  HISTORY Oncology History  Cancer of kidney (HCC)  11/18/2021 Initial Diagnosis   Cancer of kidney (HCC)   11/18/2021 Cancer Staging   Staging form: Kidney, AJCC 8th Edition - Clinical: Stage III (cT3a, cN0, cM0) - Signed by Amadeo Windell SAILOR, MD on 11/18/2021 Histologic grade (G): G4 Histologic grading system: 4 grade system   12/03/2021 - 12/03/2021 Chemotherapy   Patient is on Treatment Plan : HEAD/NECK Pembrolizumab  (200) q21d     12/03/2021 - 05/21/2022 Chemotherapy   Patient is on Treatment Plan : HEAD/NECK Pembrolizumab  (200) q21d     06/18/2022 - 12/02/2022 Chemotherapy   Patient is on Treatment Plan : RENAL CELL Nivolumab  (480) q28d         INTERVAL HISTORY  Discussed the use of AI scribe software for clinical note transcription with the patient, who gave verbal consent to proceed.    Micheal Mcintyre is a 83 y.o. male with oncologic history as above presenting to [facility] today with chief complaint of diarrhea. Accompanied to clinic today by spouse who provides additional history.   He has metastatic renal cell carcinoma managed with axitinib , initially tolerated at 3 mg twice daily (6 mg/day) for six months. Following a resolved pulmonary infection, his dose was increased to 5 mg twice daily (10 mg/day). After the dose change, he developed recurrent severe diarrhea, with multiple daily episodes progressing to watery stools after several bowel movements, accompanied by cramping abdominal pain relieved by defecation. He intermittently withheld axitinib  for several days to manage symptoms. Most recently, after taking 5 mg yesterday morning, he experienced 2-3 episodes of diarrhea in the afternoon and 2-3 more at night, with  the last episodes being liquid. He felt presyncopal around 8 AM today and noted dehydration. He took two doses of loperamide at 4 AM and one at 8 AM, which slowed the diarrhea, and has not had loose stools since early morning. He is feeling much improved at the time of today's  visit. Denies fever or chills.    ROS  All other systems are reviewed and are negative for acute change except as noted in the HPI.    Allergies[1]   Past Medical History:  Diagnosis Date   Arthritis    Bilateral cataracts    GERD (gastroesophageal reflux disease)    Hypertension    Hypothyroidism    Medicare annual wellness visit, subsequent 08/19/2014   Osteoarthritis 09/21/2007   Qualifier: Diagnosis of  By: Viviann Raddle MD, Marsha R    PONV (postoperative nausea and vomiting)    Preventative health care 06/07/2016   Right shoulder pain 12/03/2016     Past Surgical History:  Procedure Laterality Date   BRONCHIAL BIOPSY  08/17/2023   Procedure: BRONCHOSCOPY, WITH BIOPSY;  Surgeon: Shelah Lamar RAMAN, MD;  Location: [facility] ENDOSCOPY;  Service: Pulmonary;;   BRONCHIAL BRUSHINGS  08/17/2023   Procedure: BRONCHOSCOPY, WITH BRUSH BIOPSY;  Surgeon: Shelah Lamar RAMAN, MD;  Location: [facility] ENDOSCOPY;  Service: Pulmonary;;   BRONCHIAL NEEDLE ASPIRATION BIOPSY  08/17/2023   Procedure: BRONCHOSCOPY, WITH NEEDLE ASPIRATION BIOPSY;  Surgeon: Shelah Lamar RAMAN, MD;  Location: [facility] ENDOSCOPY;  Service: Pulmonary;;   COLONOSCOPY     HIP SURGERY Left    1991   JOINT REPLACEMENT Right 2005   hip   NEPHRECTOMY Right 10/15/2021   Procedure: NEPHRECTOMY, OPEN;  Surgeon: Selma Donnice SAUNDERS, MD;  Location: [facility] ORS;  Service: Urology;  Laterality: Right;   nose skin cancer removal      Social History   Socioeconomic History   Marital status: Married    Spouse name: Not on file   Number of children: Not on file   Years of education: Not on file   Highest education level: Not on file  Occupational History   Not on file  Tobacco Use   Smoking status: Never    Passive exposure: Never   Smokeless tobacco: Never  Vaping Use   Vaping status: Never Used  Substance and Sexual Activity   Alcohol use: Not Currently    Comment: 5 beers a year   Drug use: No   Sexual activity: Not Currently  Other Topics  Concern   Not on file  Social History Narrative   Not on file   Social Drivers of Health   Tobacco Use: Low Risk (03/15/2024)   Patient History    Smoking Tobacco Use: Never    Smokeless Tobacco Use: Never    Passive Exposure: Never  Financial Resource Strain: Low Risk (01/13/2024)   Overall Financial Resource Strain (CARDIA)    Difficulty of Paying Living Expenses: Not very hard  Food Insecurity: No Food Insecurity (01/13/2024)   Epic    Worried About Programme Researcher, Broadcasting/film/video in the Last Year: Never true    Ran Out of Food in the Last Year: Never true  Transportation Needs: No Transportation Needs (01/13/2024)   Epic    Lack of Transportation (Medical): No    Lack of Transportation (Non-Medical): No  Physical Activity: Sufficiently Active (01/13/2024)   Exercise Vital Sign    Days of Exercise per Week: 5 days    Minutes of Exercise per Session: 30 min  Stress: No  Stress Concern Present (01/13/2024)   [employer] of Occupational Health - Occupational Stress Questionnaire    Feeling of Stress: Not at all  Social Connections: Moderately Integrated (01/13/2024)   Social Connection and Isolation Panel    Frequency of Communication with Friends and Family: More than three times a week    Frequency of Social Gatherings with Friends and Family: More than three times a week    Attends Religious Services: More than 4 times per year    Active Member of Clubs or Organizations: No    Attends Banker Meetings: Never    Marital Status: Married  Catering Manager Violence: Not At Risk (01/13/2024)   Epic    Fear of Current or Ex-Partner: No    Emotionally Abused: No    Physically Abused: No    Sexually Abused: No  Depression (PHQ2-9): Low Risk (04/14/2024)   Depression (PHQ2-9)    PHQ-2 Score: 0  Alcohol Screen: Low Risk (01/13/2024)   Alcohol Screen    Last Alcohol Screening Score (AUDIT): 1  Housing: Low Risk (01/13/2024)   Epic    Unable to Pay for Housing in the Last  Year: No    Number of Times Moved in the Last Year: 0    Homeless in the Last Year: No  Utilities: Not At Risk (01/13/2024)   Epic    Threatened with loss of utilities: No  Health Literacy: Adequate Health Literacy (01/13/2024)   B1300 Health Literacy    Frequency of need for help with medical instructions: Never    Family History  Problem Relation Age of Onset   Heart attack Father 23       deceased   Diabetes Maternal Uncle        maternal grandfather   Hypertension Neg Hx    Breast cancer Neg Hx    Colon cancer Neg Hx    Prostate cancer Neg Hx     Current Medications[2]  PHYSICAL EXAM ECOG FS:1 - Symptomatic but completely ambulatory    Vitals:   04/14/24 1312 04/14/24 1432  BP: 101/61 116/64  Pulse: 74 68  Resp: 16 16  Temp: 97.6 F (36.4 C)   TempSrc: Temporal   SpO2: 100% 100%  Weight: 128 lb 8 oz (58.3 kg)   Height: 5' 10 (1.778 m)    Physical Exam Vitals and nursing note reviewed.  Constitutional:      Appearance: He is not ill-appearing or toxic-appearing.     Comments: Thin appearing male  HENT:     Head: Normocephalic.     Mouth/Throat:     Mouth: Mucous membranes are moist.  Eyes:     Conjunctiva/sclera: Conjunctivae normal.  Cardiovascular:     Rate and Rhythm: Normal rate and regular rhythm.     Pulses: Normal pulses.     Heart sounds: Normal heart sounds.  Pulmonary:     Effort: Pulmonary effort is normal.     Breath sounds: Normal breath sounds.  Abdominal:     General: There is no distension.     Palpations: Abdomen is soft.     Tenderness: There is no abdominal tenderness.  Musculoskeletal:     Cervical back: Normal range of motion.  Skin:    General: Skin is warm and dry.  Neurological:     Mental Status: He is alert.        LABORATORY DATA I have reviewed the data as listed    Latest Ref Rng & Units 04/14/2024  12:51 PM 04/12/2024    9:19 AM 02/24/2024    3:15 PM  CBC  WBC 4.0 - 10.5 K/uL 9.3  6.8  6.8   Hemoglobin  13.0 - 17.0 g/dL 85.8  85.1  86.2   Hematocrit 39.0 - 52.0 % 42.6  43.8  40.9   Platelets 150 - 400 K/uL 181  191  178         Latest Ref Rng & Units 04/14/2024   12:51 PM 04/12/2024    9:19 AM 02/24/2024    3:15 PM  CMP  Glucose 70 - 99 mg/dL 862  87  862   BUN 8 - 23 mg/dL 32  31  24   Creatinine 0.61 - 1.24 mg/dL 8.36  8.72  8.76   Sodium 135 - 145 mmol/L 138  139  141   Potassium 3.5 - 5.1 mmol/L 4.0  4.9  4.5   Chloride 98 - 111 mmol/L 104  102  105   CO2 22 - 32 mmol/L 21  28  29    Calcium  8.9 - 10.3 mg/dL 9.1  9.7  9.3   Total Protein 6.5 - 8.1 g/dL 6.5  7.4  7.1   Total Bilirubin 0.0 - 1.2 mg/dL 0.5  0.3  0.6   Alkaline Phos 38 - 126 U/L 98  137  121   AST 15 - 41 U/L 29  31  20    ALT 0 - 44 U/L 27  28  17         RADIOGRAPHIC STUDIES (from last 24 hours if applicable) I have personally reviewed the radiological images as listed and agreed with the findings in the report. No results found.      Visit Diagnosis: 1. Cancer, metastatic to bone (HCC)   2. Diarrhea, unspecified type      No orders of the defined types were placed in this encounter.   All questions were answered. The patient knows to call the clinic with any problems, questions or concerns. No barriers to learning was detected.  A total of more than 30 minutes were spent on this encounter with face-to-face time and non-face-to-face time, including preparing to see the patient, ordering tests and/or medications, counseling the patient and coordination of care as outlined above.    Thank you for allowing me to participate in the care of this patient.    Priscillia Fouch E  Walisiewicz, PA-C Department of Hematology/Oncology [facility] at [facility] Phone: [phone]  [phone]    04/14/2024 2:53 PM        [1]  Allergies Allergen Reactions   Statins Other (See Comments)    myalgia  [2]  Current Outpatient Medications:    acetaminophen  (TYLENOL ) 500 MG  tablet, Take 1,000 mg by mouth every 6 (six) hours as needed for moderate pain., Disp: , Rfl:    amLODipine  (NORVASC ) 5 MG tablet, Take 5 mg by mouth daily. Take only when systolic is greater than 140, Disp: , Rfl:    axitinib  (INLYTA ) 1 MG tablet, Take 6 tablets (6 mg total) by mouth 2 (two) times daily., Disp: 180 tablet, Rfl: 2   chlorthalidone (HYGROTON) 25 MG tablet, Take 25 mg by mouth every other day., Disp: , Rfl:    Cholecalciferol (VITAMIN D3) 25 MCG (1000 UT) CAPS, Take 1 capsule (1,000 Units total) by mouth daily., Disp: , Rfl:    cyanocobalamin  (VITAMIN B12) 1000 MCG tablet, Take 1 tablet (1,000 mcg total) by mouth daily., Disp: , Rfl:  furosemide (LASIX) 20 MG tablet, Take 20 mg by mouth daily as needed., Disp: , Rfl:    levothyroxine  (SYNTHROID ) 50 MCG tablet, TAKE 1 TABLET(50 MCG) BY MOUTH DAILY BEFORE BREAKFAST, Disp: 30 tablet, Rfl: 1   levothyroxine  (SYNTHROID ) 50 MCG tablet, TAKE 1 TABLET(50 MCG) BY MOUTH DAILY BEFORE BREAKFAST, Disp: 90 tablet, Rfl: 0   Multiple Vitamin (MULTIVITAMIN WITH MINERALS) TABS tablet, Take 1 tablet by mouth 4 (four) times a week., Disp: , Rfl:    telmisartan  (MICARDIS ) 80 MG tablet, TAKE 1/2 TABLET BY MOUTH TWICE DAILY, Disp: 180 tablet, Rfl: 1

## 2024-04-14 NOTE — Patient Instructions (Signed)
 Hold the Axitinib  until you hear from us  next week.  Continue Imodium as needed. Try to increase fluid intake over the next few days as well.

## 2024-04-17 ENCOUNTER — Other Ambulatory Visit: Payer: Self-pay

## 2024-04-17 ENCOUNTER — Other Ambulatory Visit (HOSPITAL_COMMUNITY): Payer: Self-pay

## 2024-04-17 NOTE — Progress Notes (Signed)
 Specialty Pharmacy Refill Coordination Note  Micheal Mcintyre is a 83 y.o. male contacted today regarding refills of specialty medication(s) Axitinib  (INLYTA )   Patient requested [name] at [facility] at [place] date: 04/18/24   Medication will be filled on: 04/18/24

## 2024-04-18 ENCOUNTER — Other Ambulatory Visit: Payer: Self-pay

## 2024-04-18 NOTE — Progress Notes (Signed)
 Specialty Pharmacy Ongoing Clinical Assessment Note  Micheal Mcintyre is a 83 y.o. male who is being followed by the specialty pharmacy service for RxSp Oncology   Patient's specialty medication(s) reviewed today: Axitinib  (INLYTA )   Missed doses in the last 4 weeks: 5 (was instructed to hold due to side effect, starting back at a decreased dose)   Patient/Caregiver did not have any additional questions or concerns.   Therapeutic benefit summary: Patient is achieving benefit   Adverse events/side effects summary: Experienced adverse events/side effects (extreme fatigue and diarrhea (treated with Imodium))   Patient's therapy is appropriate to: Continue (restarting at reduced dose, 3 BID)    Goals Addressed             This Visit's Progress    Slow Disease Progression   No change    Patient is unable to be assessed as therapy was recently initiated. Patient will maintain adherence.   Per office visit notes form 04/12/24 Micheal Mcintyre plans to order a scan at his next visit to determine if treatment is working.         Follow up: 3 months  Micheal Mcintyre Specialty Pharmacist

## 2024-05-04 ENCOUNTER — Encounter: Payer: Self-pay | Admitting: Internal Medicine

## 2024-05-05 ENCOUNTER — Encounter: Payer: Self-pay | Admitting: Internal Medicine

## 2024-05-05 ENCOUNTER — Encounter: Payer: Self-pay | Admitting: Family Medicine

## 2024-05-05 ENCOUNTER — Ambulatory Visit (INDEPENDENT_AMBULATORY_CARE_PROVIDER_SITE_OTHER): Admitting: Family Medicine

## 2024-05-05 VITALS — BP 120/68 | HR 80 | Temp 98.0°F | Resp 16 | Ht 70.0 in | Wt 134.6 lb

## 2024-05-05 DIAGNOSIS — K4090 Unilateral inguinal hernia, without obstruction or gangrene, not specified as recurrent: Secondary | ICD-10-CM

## 2024-05-05 NOTE — Patient Instructions (Signed)
 If you do not hear anything about your referral in the next 1-2 weeks, call our office and ask for an update.  If you develop severe pain, please seek immediate care.  Let us  know if you need anything.

## 2024-05-05 NOTE — Progress Notes (Signed)
 Chief Complaint  Patient presents with   Hernia    Possible Hernia      Micheal Mcintyre is a 84 y.o. male here for a skin complaint.  Duration: 3 weeks Location: L inguinal region Pruritic? No Painful? No Drainage? No New soaps/lotions/topicals/detergents? No Trauma? No Other associated symptoms: bulge in area; no bowel changes, straining, skin color changes, fevers, bowel changes Therapies tried thus far: None  Past Medical History:  Diagnosis Date   Arthritis    Bilateral cataracts    GERD (gastroesophageal reflux disease)    Hypertension    Hypothyroidism    Medicare annual wellness visit, subsequent 08/19/2014   Osteoarthritis 09/21/2007   Qualifier: Diagnosis of  By: Viviann Raddle MD, Marsha SAUNDERS    PONV (postoperative nausea and vomiting)    Preventative health care 06/07/2016   Right shoulder pain 12/03/2016    BP 120/68 (BP Location: Left Arm, Patient Position: Sitting)   Pulse 80   Temp 98 F (36.7 C) (Oral)   Resp 16   Ht 5' 10 (1.778 m)   Wt 134 lb 9.6 oz (61.1 kg)   SpO2 96%   BMI 19.31 kg/m  Gen: awake, alert, appearing stated age Lungs: No accessory muscle use GU: Visible bulge of the left inguinal region compared to the right.  It does enlarge upon Valsalva.   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  Will refer to the general surgery team for discussion of possible management options. F/u prn. The patient voiced understanding and agreement to the plan.  Mabel [place], DO 05/05/2024 2:11 PM

## 2024-05-11 ENCOUNTER — Inpatient Hospital Stay (HOSPITAL_BASED_OUTPATIENT_CLINIC_OR_DEPARTMENT_OTHER): Admitting: Internal Medicine

## 2024-05-11 ENCOUNTER — Inpatient Hospital Stay: Attending: Oncology

## 2024-05-11 VITALS — BP 122/62 | HR 75 | Temp 97.8°F | Resp 17 | Ht 70.0 in | Wt 133.9 lb

## 2024-05-11 DIAGNOSIS — C7951 Secondary malignant neoplasm of bone: Secondary | ICD-10-CM

## 2024-05-11 DIAGNOSIS — C649 Malignant neoplasm of unspecified kidney, except renal pelvis: Secondary | ICD-10-CM | POA: Diagnosis not present

## 2024-05-11 LAB — CMP (CANCER CENTER ONLY)
ALT: 21 U/L (ref 0–44)
AST: 31 U/L (ref 15–41)
Albumin: 4.4 g/dL (ref 3.5–5.0)
Alkaline Phosphatase: 93 U/L (ref 38–126)
Anion gap: 10 (ref 5–15)
BUN: 22 mg/dL (ref 8–23)
CO2: 29 mmol/L (ref 22–32)
Calcium: 10.1 mg/dL (ref 8.9–10.3)
Chloride: 103 mmol/L (ref 98–111)
Creatinine: 1.37 mg/dL — ABNORMAL HIGH (ref 0.61–1.24)
GFR, Estimated: 52 mL/min — ABNORMAL LOW
Glucose, Bld: 91 mg/dL (ref 70–99)
Potassium: 4.7 mmol/L (ref 3.5–5.1)
Sodium: 142 mmol/L (ref 135–145)
Total Bilirubin: 0.5 mg/dL (ref 0.0–1.2)
Total Protein: 7.3 g/dL (ref 6.5–8.1)

## 2024-05-11 LAB — CBC WITH DIFFERENTIAL (CANCER CENTER ONLY)
Abs Immature Granulocytes: 0.01 K/uL (ref 0.00–0.07)
Basophils Absolute: 0.1 K/uL (ref 0.0–0.1)
Basophils Relative: 1 %
Eosinophils Absolute: 0.1 K/uL (ref 0.0–0.5)
Eosinophils Relative: 2 %
HCT: 43.6 % (ref 39.0–52.0)
Hemoglobin: 14.5 g/dL (ref 13.0–17.0)
Immature Granulocytes: 0 %
Lymphocytes Relative: 14 %
Lymphs Abs: 0.9 K/uL (ref 0.7–4.0)
MCH: 32.2 pg (ref 26.0–34.0)
MCHC: 33.3 g/dL (ref 30.0–36.0)
MCV: 96.7 fL (ref 80.0–100.0)
Monocytes Absolute: 0.7 K/uL (ref 0.1–1.0)
Monocytes Relative: 10 %
Neutro Abs: 4.7 K/uL (ref 1.7–7.7)
Neutrophils Relative %: 73 %
Platelet Count: 185 K/uL (ref 150–400)
RBC: 4.51 MIL/uL (ref 4.22–5.81)
RDW: 13 % (ref 11.5–15.5)
WBC Count: 6.4 K/uL (ref 4.0–10.5)
nRBC: 0 % (ref 0.0–0.2)

## 2024-05-11 NOTE — Progress Notes (Signed)
 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    PRIOR THERAPY: 1) Status post right open radical nephrectomy completed on October 15, 2021 by Dr. Donnice Siad.  2) Keytruda  200 Mg IV every 3 weeks started December 03, 2021.  Status post 9 cycles.  Last dose was given May 21, 2022.  This was discontinued secondary to disease progression. 3) Systemic treatment with nivolumab  480 Mg IV every 4 weeks in addition to Cabometyx  40 mg p.o. daily.  First dose June 18, 2022.  Status post 7 cycles.  His Cabometyx  has been on hold recently due to adverse side effects. His dose was reduced in May to 20 mg p.o. daily but he continues to have weakness, diarrhea, and dehydration. This has been on hold since 5/19.  Last dose was given December 02, 2022 discontinued secondary to disease progression. 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   CURRENT THERAPY: Axitinib  3 mg p.o. twice daily.  First dose started 11/03/2023.  INTERVAL HISTORY: Micheal Mcintyre 84 y.o. male returns to the clinic today for follow-up visit accompanied by his wife. Discussed the use of AI scribe software for clinical note transcription with the patient, who gave verbal consent to proceed.  History of Present Illness Micheal Mcintyre is an 84 year old male with metastatic papillary renal cell carcinoma, status post nephrectomy, who presents for routine evaluation and laboratory monitoring while on exidine  therapy.  He is currently  receiving exidine  3 mg orally twice daily, initiated in July 2025 after multiple prior therapies.  He experienced a single episode of significant diarrhea yesterday, which he attributed to dietary factors. Bowel movements have since normalized after resuming exidine  at 6 mg daily (3 mg BID). He denies recurrent or persistent diarrhea.  He has a left-sided inguinal hernia.     MEDICAL HISTORY: Past Medical History:  Diagnosis Date   Arthritis    Bilateral cataracts    GERD (gastroesophageal reflux disease)    Hypertension    Hypothyroidism    Medicare annual wellness visit, subsequent 08/19/2014   Osteoarthritis 09/21/2007   Qualifier: Diagnosis of  By: Viviann Raddle MD, Marsha R    PONV (postoperative nausea and vomiting)    Preventative health care 06/07/2016   Right shoulder pain 12/03/2016    ALLERGIES:  is allergic to statins.  MEDICATIONS:  Current Outpatient Medications  Medication Sig Dispense Refill   acetaminophen  (TYLENOL ) 500 MG tablet Take 1,000 mg by mouth every 6 (six) hours as needed for moderate pain.     amLODipine  (NORVASC ) 5 MG tablet Take 5 mg by mouth daily. Take only when systolic is greater than 140     axitinib  (INLYTA ) 1 MG tablet Take 3 tablets (3 mg total) by mouth 2 (two) times daily. 180 tablet 2   chlorthalidone (HYGROTON) 25 MG tablet Take 25 mg by mouth every other day.     Cholecalciferol (VITAMIN D3) 25 MCG (1000 UT) CAPS Take 1 capsule (1,000 Units total) by  mouth daily.     cyanocobalamin  (VITAMIN B12) 1000 MCG tablet Take 1 tablet (1,000 mcg total) by mouth daily.     furosemide (LASIX) 20 MG tablet Take 20 mg by mouth daily as needed.     levothyroxine  (SYNTHROID ) 50 MCG tablet TAKE 1 TABLET(50 MCG) BY MOUTH DAILY BEFORE BREAKFAST 30 tablet 1   levothyroxine  (SYNTHROID ) 50 MCG tablet TAKE 1 TABLET(50 MCG) BY MOUTH DAILY BEFORE BREAKFAST 90 tablet 0   Multiple Vitamin (MULTIVITAMIN WITH MINERALS) TABS tablet Take 1 tablet by mouth 4 (four)  times a week.     telmisartan  (MICARDIS ) 80 MG tablet TAKE 1/2 TABLET BY MOUTH TWICE DAILY 180 tablet 1   No current facility-administered medications for this visit.    SURGICAL HISTORY:  Past Surgical History:  Procedure Laterality Date   BRONCHIAL BIOPSY  08/17/2023   Procedure: BRONCHOSCOPY, WITH BIOPSY;  Surgeon: Shelah Lamar RAMAN, MD;  Location: [facility] ENDOSCOPY;  Service: Pulmonary;;   BRONCHIAL BRUSHINGS  08/17/2023   Procedure: BRONCHOSCOPY, WITH BRUSH BIOPSY;  Surgeon: Shelah Lamar RAMAN, MD;  Location: [facility] ENDOSCOPY;  Service: Pulmonary;;   BRONCHIAL NEEDLE ASPIRATION BIOPSY  08/17/2023   Procedure: BRONCHOSCOPY, WITH NEEDLE ASPIRATION BIOPSY;  Surgeon: Shelah Lamar RAMAN, MD;  Location: [facility] ENDOSCOPY;  Service: Pulmonary;;   COLONOSCOPY     HIP SURGERY Left    1991   JOINT REPLACEMENT Right 2005   hip   NEPHRECTOMY Right 10/15/2021   Procedure: NEPHRECTOMY, OPEN;  Surgeon: Selma Donnice SAUNDERS, MD;  Location: [facility] ORS;  Service: Urology;  Laterality: Right;   nose skin cancer removal      REVIEW OF SYSTEMS:  A comprehensive review of systems was negative except for: Constitutional: positive for fatigue Gastrointestinal: positive for diarrhea   PHYSICAL EXAMINATION: General appearance: alert, cooperative, fatigued, and no distress Head: Normocephalic, without obvious abnormality, atraumatic Neck: no adenopathy, no JVD, supple, symmetrical, trachea midline, and thyroid  not enlarged, symmetric, no tenderness/mass/nodules Lymph nodes: Cervical, supraclavicular, and axillary nodes normal. Resp: clear to auscultation bilaterally Back: symmetric, no curvature. ROM normal. No CVA tenderness. Cardio: regular rate and rhythm, S1, S2 normal, no murmur, click, rub or gallop GI: soft, non-tender; bowel sounds normal; no masses,  no organomegaly Extremities: extremities normal, atraumatic, no cyanosis or edema  ECOG PERFORMANCE STATUS: 1 - Symptomatic but completely ambulatory  Blood pressure 122/62,  pulse 75, temperature 97.8 F (36.6 C), temperature source Temporal, resp. rate 17, height 5' 10 (1.778 m), weight 133 lb 14.4 oz (60.7 kg), SpO2 98%.  LABORATORY DATA: Lab Results  Component Value Date   WBC 6.4 05/11/2024   HGB 14.5 05/11/2024   HCT 43.6 05/11/2024   MCV 96.7 05/11/2024   PLT 185 05/11/2024      Chemistry      Component Value Date/Time   NA 138 04/14/2024 1251   NA 140 04/22/2022 0000   K 4.0 04/14/2024 1251   CL 104 04/14/2024 1251   CO2 21 (L) 04/14/2024 1251   BUN 32 (H) 04/14/2024 1251   BUN 21 04/22/2022 0000   CREATININE 1.63 (H) 04/14/2024 1251   CREATININE 1.50 (H) 11/25/2023 1124   GLU 88 04/22/2022 0000      Component Value Date/Time   CALCIUM  9.1 04/14/2024 1251   ALKPHOS 98 04/14/2024 1251   AST 29 04/14/2024 1251   ALT 27 04/14/2024 1251   BILITOT 0.5 04/14/2024 1251       RADIOGRAPHIC STUDIES: No results found.     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  He is status post right open radical nephrectomy in June 2023 followed by 9 cycles of treatment with immunotherapy with Keytruda  last dose was given May 21, 2022 before it was discontinued secondary to disease progression.  The patient then started systemic chemotherapy with nivolumab  and Cabometyx  initially at 40 mg p.o. daily but this was reduced to 20 mg p.o. daily before it was discontinued secondary to intolerance.  His treatment with nivolumab  was discontinued after 7 cycles secondary to disease progression.  The patient was seen by Dr. Joyice at [facility] for a second opinion and she recommended for the patient treatment with lenvatinib  and everolimus  starting at a reduced dose. He is currently on treatment with lenvatinib  14  mg p.o. daily in addition to everolimus  5 mg p.o. daily started February 02, 2023.  He has been tolerating this treatment fairly well except for occasional diarrhea.  His treatment has been on hold since July 07, 2023 secondary to suspicious pulmonary infection/inflammatory process. He had repeat CT scan of the chest performed recently.  I personally independently reviewed the scan and discussed the result with the patient and his wife.  His scan showed redemonstration of the cavitary lesions in the right upper lobe and left upper lobe with the right upper lobe lesion slightly decreased in size.  There is also multiple pulmonary nodules consistent with metastatic disease several centimeters opdualag..  With the prior exam.  There was also slight increase in AP window lymph nodes could be reactive versus metastatic.  There is decrease in the hypodense liver lesion compared to the prior exam. The patient is currently on treatment with voriconazole  for pulmonary aspergillus. He is currently undergoing treatment with axitinib  3 mg p.o. twice daily started November 03, 2023 and tolerated it fairly well. Assessment and Plan Assessment & Plan Metastatic papillary renal cell carcinoma He has metastatic papillary renal cell carcinoma, papillary subtype, which is less common and more challenging to treat than clear cell renal cell carcinoma. He is currently receiving exidine  3 mg orally twice daily, initiated July 2025, with manageable side effects. Disease status will be reassessed with imaging prior to the next visit. Alternative therapies will be considered if disease progression or intolerable toxicity occurs. - Continued exidine  3 mg orally twice daily. - Ordered CT scan one week prior to next visit to assess disease status. - Scheduled follow-up in one month. - Will discuss alternative therapies if disease progression or intolerable side effects occur.  Drug-induced diarrhea He experienced a significant episode  of diarrhea yesterday, attributed to dietary factors. Diarrhea has previously occurred with exidine  dose adjustments, but at the current dose, symptoms have resolved and bowel movements are normal. - Maintained current exidine  dose (3 mg twice daily).  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 Renal function is well-managed and most recent serum creatinine is improved compared to prior visit. Renal function is closely monitored due to underlying malignancy and ongoing therapy. - Emphasized renal protection strategies. He was advised to call immediately if he has any other concerning symptoms in the interval. The patient voices understanding of current disease status and treatment options and is in agreement with the current care plan.  All questions were answered. The patient  knows to call the clinic with any problems, questions or concerns. We can certainly see the patient much sooner if necessary.  The total time spent in the appointment was 20 minutes.  Disclaimer: This note was dictated with voice recognition software. Similar sounding words can inadvertently be transcribed and may not be corrected upon review.        "

## 2024-05-12 ENCOUNTER — Other Ambulatory Visit: Payer: Self-pay

## 2024-05-12 ENCOUNTER — Other Ambulatory Visit (HOSPITAL_COMMUNITY): Payer: Self-pay

## 2024-05-12 NOTE — Progress Notes (Signed)
 Patient called back after finding more tablets from a previous bottle and asked to change pick up date (now has 51 tablets). Patient will now pick up medication on 05/24/24

## 2024-05-12 NOTE — Progress Notes (Signed)
 Specialty Pharmacy Refill Coordination Note  Micheal Mcintyre is a 84 y.o. male contacted today regarding refills of specialty medication(s) Axitinib  (INLYTA )   Patient requested [name] at [facility] [place] date: 05/19/24   Medication will be filled on: 05/18/24

## 2024-05-12 NOTE — Progress Notes (Signed)
 Patient called back - extra tablets that he found were from an old dose. Will pick up on 05/19/24 as originally scheduled.

## 2024-05-15 NOTE — Progress Notes (Signed)
 HARMON BOMMARITO                                          MRN: [id number]   05/15/2024   The [facility] Quality Team Specialist reviewed this patient medical record for the purposes of chart review for care gap closure. The following were reviewed: abstraction for care gap closure-controlling blood pressure.    [facility]I Quality Team

## 2024-05-17 ENCOUNTER — Encounter: Payer: Self-pay | Admitting: Emergency Medicine

## 2024-05-17 ENCOUNTER — Ambulatory Visit: Admitting: Emergency Medicine

## 2024-05-17 VITALS — BP 129/72 | HR 67 | Temp 97.5°F | Ht 70.0 in | Wt 137.0 lb

## 2024-05-17 DIAGNOSIS — B449 Aspergillosis, unspecified: Secondary | ICD-10-CM | POA: Diagnosis not present

## 2024-05-17 DIAGNOSIS — C641 Malignant neoplasm of right kidney, except renal pelvis: Secondary | ICD-10-CM

## 2024-05-17 DIAGNOSIS — C649 Malignant neoplasm of unspecified kidney, except renal pelvis: Secondary | ICD-10-CM

## 2024-05-17 NOTE — Assessment & Plan Note (Signed)
 His most recent CT scan of the chest from 02/2024 showed significant improvement in his right upper lobe and left lower lobe superior segment cavitary lesions that were biopsied during the bronchoscopy.  He was treated with an extended course of voriconazole  and is now off.  Did explain to him that we do not know for sure that the has been completely eradicated but that he does not appear to have any active disease and it certainly improved.  We will have to pay attention to his serial imaging to ensure no evidence for recurrent disease as he is treated for his cancer.  If lesions on CT involve and it is unclear whether these represent malignancy versus infection then he may require repeat bronchoscopy.  Discussed this with him today.

## 2024-05-17 NOTE — Assessment & Plan Note (Signed)
 Currently on axitinib  and following with Dr. Sherrod.  Serial imaging is planned.  His most recent from October showed persistent disease but improvement in the areas that we have presumed to be Aspergillus.

## 2024-05-17 NOTE — Progress Notes (Signed)
 "  Subjective:    Patient ID: Micheal Mcintyre, male    DOB: [birth year], 84 y.o.   MRN: [id number]  HPI  ROV 05/17/2024 --84 year old gentleman with a history of metastatic renal cell carcinoma whom I have seen for progressive consolidated opacities in the right upper lobe, right lower lobe, left lower lobe with some cavitation.  Navigational bronchoscopy 08/17/2023 was negative for malignancy but did show Aspergillus - completed several months therapy.  His chemotherapy had to be held 07/2023 to facilitate treatment.  Then restarted axitinib  in 11/2023 (no interaction with his voriconazole ).  He saw Dr. Sherrod 1/8. Currently off voriconazole . No fevers, has an occasional dry cough.   CT scan of the chest 02/18/2024 showed numerous discrete bilateral pulmonary nodules, innumerable clustered centrilobular and tree-in-bud nodules some of which were cavitary.  His 2 focal cavitary opacities in the superior segment left lower lobe and right upper lobe, have largely resolved, now are thin-walled cavities.   Review of Systems As per HPI  Past Medical History:  Diagnosis Date   Arthritis    Bilateral cataracts    GERD (gastroesophageal reflux disease)    Hypertension    Hypothyroidism    Medicare annual wellness visit, subsequent 08/19/2014   Osteoarthritis 09/21/2007   Qualifier: Diagnosis of  By: Viviann Raddle MD, Marsha R    PONV (postoperative nausea and vomiting)    Preventative health care 06/07/2016   Right shoulder pain 12/03/2016     Family History  Problem Relation Age of Onset   Heart attack Father 36       deceased   Diabetes Maternal Uncle        maternal grandfather   Hypertension Neg Hx    Breast cancer Neg Hx    Colon cancer Neg Hx    Prostate cancer Neg Hx      Social History   Socioeconomic History   Marital status: Married    Spouse name: Not on file   Number of children: Not on file   Years of education: Not on file   Highest education level: Not on file   Occupational History   Not on file  Tobacco Use   Smoking status: Never    Passive exposure: Never   Smokeless tobacco: Never  Vaping Use   Vaping status: Never Used  Substance and Sexual Activity   Alcohol use: Not Currently    Comment: 5 beers a year   Drug use: No   Sexual activity: Not Currently  Other Topics Concern   Not on file  Social History Narrative   Not on file   Social Drivers of Health   Tobacco Use: Low Risk (05/17/2024)   Patient History    Smoking Tobacco Use: Never    Smokeless Tobacco Use: Never    Passive Exposure: Never  Financial Resource Strain: Low Risk (01/13/2024)   Overall Financial Resource Strain (CARDIA)    Difficulty of Paying Living Expenses: Not very hard  Food Insecurity: No Food Insecurity (01/13/2024)   Epic    Worried About Programme Researcher, Broadcasting/film/video in the Last Year: Never true    Ran Out of Food in the Last Year: Never true  Transportation Needs: No Transportation Needs (01/13/2024)   Epic    Lack of Transportation (Medical): No    Lack of Transportation (Non-Medical): No  Physical Activity: Sufficiently Active (01/13/2024)   Exercise Vital Sign    Days of Exercise per Week: 5 days    Minutes of Exercise per  Session: 30 min  Stress: No Stress Concern Present (01/13/2024)   [employer] of Occupational Health - Occupational Stress Questionnaire    Feeling of Stress: Not at all  Social Connections: Moderately Integrated (01/13/2024)   Social Connection and Isolation Panel    Frequency of Communication with Friends and Family: More than three times a week    Frequency of Social Gatherings with Friends and Family: More than three times a week    Attends Religious Services: More than 4 times per year    Active Member of [employer] or Organizations: No    Attends Banker Meetings: Never    Marital Status: Married  Catering Manager Violence: Not At Risk (01/13/2024)   Epic    Fear of Current or Ex-Partner: No    Emotionally  Abused: No    Physically Abused: No    Sexually Abused: No  Depression (PHQ2-9): Low Risk (05/05/2024)   Depression (PHQ2-9)    PHQ-2 Score: 0  Alcohol Screen: Low Risk (01/13/2024)   Alcohol Screen    Last Alcohol Screening Score (AUDIT): 1  Housing: Low Risk (01/13/2024)   Epic    Unable to Pay for Housing in the Last Year: No    Number of Times Moved in the Last Year: 0    Homeless in the Last Year: No  Utilities: Not At Risk (01/13/2024)   Epic    Threatened with loss of utilities: No  Health Literacy: Adequate Health Literacy (01/13/2024)   B1300 Health Literacy    Frequency of need for help with medical instructions: Never    - Never smoker  Allergies  Allergen Reactions   Statins Other (See Comments)    myalgia     Outpatient Medications Prior to Visit  Medication Sig Dispense Refill   acetaminophen  (TYLENOL ) 500 MG tablet Take 1,000 mg by mouth every 6 (six) hours as needed for moderate pain.     amLODipine  (NORVASC ) 5 MG tablet Take 5 mg by mouth daily. Take only when systolic is greater than 140     axitinib  (INLYTA ) 1 MG tablet Take 3 tablets (3 mg total) by mouth 2 (two) times daily. 180 tablet 2   chlorthalidone (HYGROTON) 25 MG tablet Take 25 mg by mouth every other day.     Cholecalciferol (VITAMIN D3) 25 MCG (1000 UT) CAPS Take 1 capsule (1,000 Units total) by mouth daily.     cyanocobalamin  (VITAMIN B12) 1000 MCG tablet Take 1 tablet (1,000 mcg total) by mouth daily.     furosemide (LASIX) 20 MG tablet Take 20 mg by mouth daily as needed.     levothyroxine  (SYNTHROID ) 50 MCG tablet TAKE 1 TABLET(50 MCG) BY MOUTH DAILY BEFORE BREAKFAST 30 tablet 1   levothyroxine  (SYNTHROID ) 50 MCG tablet TAKE 1 TABLET(50 MCG) BY MOUTH DAILY BEFORE BREAKFAST 90 tablet 0   Multiple Vitamin (MULTIVITAMIN WITH MINERALS) TABS tablet Take 1 tablet by mouth 4 (four) times a week.     telmisartan  (MICARDIS ) 80 MG tablet TAKE 1/2 TABLET BY MOUTH TWICE DAILY 180 tablet 1   No  facility-administered medications prior to visit.         Objective:   Physical Exam Vitals:   05/17/24 1159  BP: 129/72  Pulse: 67  Temp: (!) 97.5 F (36.4 C)  TempSrc: Oral  SpO2: 96%  Weight: 137 lb (62.1 kg)  Height: 5' 10 (1.778 m)      Gen: Pleasant, thin ill appearing man, in no distress,  normal affect  ENT: No lesions,  mouth clear,  oropharynx clear, no postnasal drip  Neck: No JVD, no stridor  Lungs: No use of accessory muscles, distant and coarse  Cardiovascular: RRR, heart sounds normal, no murmur or gallops, no peripheral edema  Musculoskeletal: No deformities, no cyanosis or clubbing  Neuro: alert, awake, non focal  Skin: Warm, no lesions or rash      Assessment & Plan:  Aspergillus pneumonia (HCC) His most recent CT scan of the chest from 02/2024 showed significant improvement in his right upper lobe and left lower lobe superior segment cavitary lesions that were biopsied during the bronchoscopy.  He was treated with an extended course of voriconazole  and is now off.  Did explain to him that we do not know for sure that the has been completely eradicated but that he does not appear to have any active disease and it certainly improved.  We will have to pay attention to his serial imaging to ensure no evidence for recurrent disease as he is treated for his cancer.  If lesions on CT involve and it is unclear whether these represent malignancy versus infection then he may require repeat bronchoscopy.  Discussed this with him today.  Cancer of kidney (HCC) Currently on axitinib  and following with Dr. Sherrod.  Serial imaging is planned.  His most recent from October showed persistent disease but improvement in the areas that we have presumed to be Aspergillus.  I personally spent a total of 33 minutes in the care of the patient today including preparing to see the patient, getting/reviewing separately obtained history, performing a medically appropriate  exam/evaluation, counseling and educating, referring and communicating with other health care professionals, documenting clinical information in the EHR, independently interpreting results, and communicating results.    Lamar Chris, MD, PhD 05/17/2024, 2:09 PM [facility] [phone] or if no answer before 7:00PM call [phone] For any issues after 7:00PM please call eLink [phone]   "

## 2024-05-17 NOTE — Patient Instructions (Signed)
 We reviewed your CT scans of the chest today. Get your surveillance CT chest with Dr. Sherrod as planned. If there is any evidence of possible recurrent infection, or question as to whether your imaging shows an infectious process versus your cancer, then we could consider repeating your bronchoscopy to clarify. Follow Dr. Shelah in 6 months.  Call sooner if you have any problems.

## 2024-05-18 ENCOUNTER — Other Ambulatory Visit (HOSPITAL_COMMUNITY): Payer: Self-pay

## 2024-05-18 ENCOUNTER — Other Ambulatory Visit: Payer: Self-pay

## 2024-05-19 ENCOUNTER — Other Ambulatory Visit (HOSPITAL_COMMUNITY): Payer: Self-pay

## 2024-05-22 ENCOUNTER — Other Ambulatory Visit: Payer: Self-pay

## 2024-05-31 ENCOUNTER — Ambulatory Visit (HOSPITAL_BASED_OUTPATIENT_CLINIC_OR_DEPARTMENT_OTHER)
Admission: RE | Admit: 2024-05-31 | Discharge: 2024-05-31 | Disposition: A | Discharge status: Home / Self Care | Source: Ambulatory Visit | Attending: Internal Medicine | Admitting: Internal Medicine

## 2024-05-31 DIAGNOSIS — R590 Localized enlarged lymph nodes: Secondary | ICD-10-CM | POA: Diagnosis not present

## 2024-05-31 DIAGNOSIS — M19011 Primary osteoarthritis, right shoulder: Secondary | ICD-10-CM | POA: Diagnosis not present

## 2024-05-31 DIAGNOSIS — J948 Other specified pleural conditions: Secondary | ICD-10-CM | POA: Diagnosis not present

## 2024-05-31 DIAGNOSIS — K4090 Unilateral inguinal hernia, without obstruction or gangrene, not specified as recurrent: Secondary | ICD-10-CM | POA: Diagnosis not present

## 2024-05-31 DIAGNOSIS — R918 Other nonspecific abnormal finding of lung field: Secondary | ICD-10-CM | POA: Diagnosis not present

## 2024-05-31 DIAGNOSIS — M1612 Unilateral primary osteoarthritis, left hip: Secondary | ICD-10-CM | POA: Insufficient documentation

## 2024-05-31 DIAGNOSIS — C641 Malignant neoplasm of right kidney, except renal pelvis: Secondary | ICD-10-CM | POA: Insufficient documentation

## 2024-05-31 DIAGNOSIS — J90 Pleural effusion, not elsewhere classified: Secondary | ICD-10-CM | POA: Insufficient documentation

## 2024-05-31 DIAGNOSIS — I700 Atherosclerosis of aorta: Secondary | ICD-10-CM | POA: Insufficient documentation

## 2024-05-31 DIAGNOSIS — C649 Malignant neoplasm of unspecified kidney, except renal pelvis: Secondary | ICD-10-CM

## 2024-05-31 DIAGNOSIS — C787 Secondary malignant neoplasm of liver and intrahepatic bile duct: Secondary | ICD-10-CM | POA: Insufficient documentation

## 2024-06-08 ENCOUNTER — Inpatient Hospital Stay: Admitting: Internal Medicine

## 2024-06-09 ENCOUNTER — Other Ambulatory Visit (HOSPITAL_COMMUNITY): Payer: Self-pay

## 2024-06-12 ENCOUNTER — Inpatient Hospital Stay: Attending: Oncology | Admitting: Internal Medicine

## 2024-08-10 ENCOUNTER — Ambulatory Visit: Admitting: Family Medicine

## 2024-09-14 ENCOUNTER — Ambulatory Visit: Admitting: Family Medicine

## 2025-01-18 ENCOUNTER — Ambulatory Visit
